# Patient Record
Sex: Female | Born: 1960 | Race: White | Hispanic: No | Marital: Married | Smoking: Current every day smoker
Health system: Southern US, Community
[De-identification: ages and names within clinical notes are randomized; demographics above are authoritative.]

## PROBLEM LIST (undated history)

## (undated) ENCOUNTER — Emergency Department (HOSPITAL_BASED_OUTPATIENT_CLINIC_OR_DEPARTMENT_OTHER): Admission: EM | Discharge status: Absent without leave | Payer: Self-pay

## (undated) DIAGNOSIS — K219 Gastro-esophageal reflux disease without esophagitis: Secondary | ICD-10-CM

## (undated) DIAGNOSIS — Z8719 Personal history of other diseases of the digestive system: Secondary | ICD-10-CM

## (undated) DIAGNOSIS — R911 Solitary pulmonary nodule: Secondary | ICD-10-CM

## (undated) DIAGNOSIS — I2510 Atherosclerotic heart disease of native coronary artery without angina pectoris: Secondary | ICD-10-CM

## (undated) DIAGNOSIS — Z973 Presence of spectacles and contact lenses: Secondary | ICD-10-CM

## (undated) DIAGNOSIS — G8929 Other chronic pain: Secondary | ICD-10-CM

## (undated) DIAGNOSIS — Z794 Long term (current) use of insulin: Secondary | ICD-10-CM

## (undated) DIAGNOSIS — E118 Type 2 diabetes mellitus with unspecified complications: Secondary | ICD-10-CM

## (undated) DIAGNOSIS — M545 Low back pain, unspecified: Secondary | ICD-10-CM

## (undated) DIAGNOSIS — Z9289 Personal history of other medical treatment: Secondary | ICD-10-CM

## (undated) DIAGNOSIS — J45909 Unspecified asthma, uncomplicated: Secondary | ICD-10-CM

## (undated) DIAGNOSIS — I469 Cardiac arrest, cause unspecified: Secondary | ICD-10-CM

## (undated) DIAGNOSIS — Z8711 Personal history of peptic ulcer disease: Secondary | ICD-10-CM

## (undated) DIAGNOSIS — R0789 Other chest pain: Secondary | ICD-10-CM

## (undated) DIAGNOSIS — E119 Type 2 diabetes mellitus without complications: Secondary | ICD-10-CM

## (undated) DIAGNOSIS — R51 Headache: Secondary | ICD-10-CM

## (undated) DIAGNOSIS — I509 Heart failure, unspecified: Secondary | ICD-10-CM

## (undated) DIAGNOSIS — F419 Anxiety disorder, unspecified: Secondary | ICD-10-CM

## (undated) DIAGNOSIS — E785 Hyperlipidemia, unspecified: Secondary | ICD-10-CM

## (undated) DIAGNOSIS — F329 Major depressive disorder, single episode, unspecified: Secondary | ICD-10-CM

## (undated) DIAGNOSIS — R519 Headache, unspecified: Secondary | ICD-10-CM

## (undated) DIAGNOSIS — I219 Acute myocardial infarction, unspecified: Secondary | ICD-10-CM

## (undated) DIAGNOSIS — G629 Polyneuropathy, unspecified: Secondary | ICD-10-CM

## (undated) DIAGNOSIS — M25519 Pain in unspecified shoulder: Secondary | ICD-10-CM

## (undated) DIAGNOSIS — M1990 Unspecified osteoarthritis, unspecified site: Secondary | ICD-10-CM

## (undated) DIAGNOSIS — E1340 Other specified diabetes mellitus with diabetic neuropathy, unspecified: Secondary | ICD-10-CM

## (undated) DIAGNOSIS — E039 Hypothyroidism, unspecified: Secondary | ICD-10-CM

## (undated) DIAGNOSIS — IMO0001 Reserved for inherently not codable concepts without codable children: Secondary | ICD-10-CM

## (undated) DIAGNOSIS — I209 Angina pectoris, unspecified: Secondary | ICD-10-CM

## (undated) DIAGNOSIS — F32A Depression, unspecified: Secondary | ICD-10-CM

## (undated) DIAGNOSIS — J449 Chronic obstructive pulmonary disease, unspecified: Secondary | ICD-10-CM

## (undated) DIAGNOSIS — I10 Essential (primary) hypertension: Secondary | ICD-10-CM

## (undated) HISTORY — PX: ABDOMINAL HYSTERECTOMY: SHX81

## (undated) HISTORY — PX: ESOPHAGOGASTRODUODENOSCOPY: SHX1529

## (undated) HISTORY — DX: Other chest pain: R07.89

## (undated) HISTORY — PX: CARDIAC SURGERY: SHX584

## (undated) HISTORY — DX: Solitary pulmonary nodule: R91.1

## (undated) HISTORY — DX: Other chronic pain: G89.29

## (undated) HISTORY — DX: Type 2 diabetes mellitus with unspecified complications: E11.8

## (undated) HISTORY — DX: Atherosclerotic heart disease of native coronary artery without angina pectoris: I25.10

## (undated) HISTORY — DX: Unspecified osteoarthritis, unspecified site: M19.90

## (undated) HISTORY — DX: Gastro-esophageal reflux disease without esophagitis: K21.9

## (undated) HISTORY — DX: Angina pectoris, unspecified: I20.9

## (undated) HISTORY — DX: Chronic obstructive pulmonary disease, unspecified: J44.9

## (undated) HISTORY — DX: Essential (primary) hypertension: I10

## (undated) HISTORY — DX: Acute myocardial infarction, unspecified: I21.9

## (undated) HISTORY — DX: Type 2 diabetes mellitus without complications: E11.9

## (undated) HISTORY — PX: COLONOSCOPY: SHX174

## (undated) HISTORY — DX: Hyperlipidemia, unspecified: E78.5

## (undated) HISTORY — DX: Pain in unspecified shoulder: M25.519

---

## 1989-05-04 HISTORY — PX: BREAST SURGERY: SHX581

## 1989-05-04 HISTORY — PX: GASTRIC RESECTION: SHX5248

## 1989-05-04 HISTORY — PX: ABDOMINAL HYSTERECTOMY: SHX81

## 1998-09-03 HISTORY — PX: LAPAROSCOPIC CHOLECYSTECTOMY: SUR755

## 2013-11-01 DIAGNOSIS — I219 Acute myocardial infarction, unspecified: Secondary | ICD-10-CM

## 2013-11-01 DIAGNOSIS — Z955 Presence of coronary angioplasty implant and graft: Secondary | ICD-10-CM | POA: Insufficient documentation

## 2013-11-01 HISTORY — DX: Acute myocardial infarction, unspecified: I21.9

## 2013-11-11 HISTORY — PX: CORONARY ANGIOPLASTY WITH STENT PLACEMENT: SHX49

## 2014-05-12 ENCOUNTER — Encounter: Payer: Self-pay | Admitting: Physical Medicine & Rehabilitation

## 2014-06-14 ENCOUNTER — Encounter: Payer: BC Managed Care – PPO | Attending: Physical Medicine & Rehabilitation

## 2014-06-14 ENCOUNTER — Ambulatory Visit: Payer: Self-pay | Admitting: Physical Medicine & Rehabilitation

## 2014-09-01 HISTORY — PX: CARDIAC CATHETERIZATION: SHX172

## 2014-09-10 ENCOUNTER — Encounter: Payer: Self-pay | Admitting: *Deleted

## 2014-09-10 DIAGNOSIS — M1990 Unspecified osteoarthritis, unspecified site: Secondary | ICD-10-CM | POA: Insufficient documentation

## 2014-09-10 DIAGNOSIS — M549 Dorsalgia, unspecified: Secondary | ICD-10-CM

## 2014-09-10 DIAGNOSIS — I209 Angina pectoris, unspecified: Secondary | ICD-10-CM | POA: Insufficient documentation

## 2014-09-10 DIAGNOSIS — M25519 Pain in unspecified shoulder: Secondary | ICD-10-CM

## 2014-09-10 DIAGNOSIS — E118 Type 2 diabetes mellitus with unspecified complications: Secondary | ICD-10-CM | POA: Insufficient documentation

## 2014-09-10 DIAGNOSIS — E785 Hyperlipidemia, unspecified: Secondary | ICD-10-CM | POA: Insufficient documentation

## 2014-09-10 DIAGNOSIS — R0789 Other chest pain: Secondary | ICD-10-CM | POA: Insufficient documentation

## 2014-09-10 DIAGNOSIS — K219 Gastro-esophageal reflux disease without esophagitis: Secondary | ICD-10-CM | POA: Insufficient documentation

## 2014-09-10 DIAGNOSIS — G8929 Other chronic pain: Secondary | ICD-10-CM | POA: Insufficient documentation

## 2014-09-10 DIAGNOSIS — I25119 Atherosclerotic heart disease of native coronary artery with unspecified angina pectoris: Secondary | ICD-10-CM | POA: Insufficient documentation

## 2014-09-10 DIAGNOSIS — J449 Chronic obstructive pulmonary disease, unspecified: Secondary | ICD-10-CM | POA: Insufficient documentation

## 2014-09-13 ENCOUNTER — Encounter: Payer: Self-pay | Admitting: Thoracic Surgery (Cardiothoracic Vascular Surgery)

## 2014-09-13 ENCOUNTER — Institutional Professional Consult (permissible substitution) (INDEPENDENT_AMBULATORY_CARE_PROVIDER_SITE_OTHER): Payer: BLUE CROSS/BLUE SHIELD | Admitting: Thoracic Surgery (Cardiothoracic Vascular Surgery)

## 2014-09-13 ENCOUNTER — Other Ambulatory Visit: Payer: Self-pay | Admitting: *Deleted

## 2014-09-13 VITALS — BP 122/75 | HR 97 | Resp 16 | Ht 63.0 in | Wt 208.0 lb

## 2014-09-13 DIAGNOSIS — I209 Angina pectoris, unspecified: Secondary | ICD-10-CM

## 2014-09-13 DIAGNOSIS — I2510 Atherosclerotic heart disease of native coronary artery without angina pectoris: Secondary | ICD-10-CM

## 2014-09-13 DIAGNOSIS — I25119 Atherosclerotic heart disease of native coronary artery with unspecified angina pectoris: Secondary | ICD-10-CM

## 2014-09-13 NOTE — Progress Notes (Signed)
[address]       Sara Hobbs [address]             [phone]     CARDIOTHORACIC SURGERY CONSULTATION REPORT  Referring Provider is Sara Hobbs., MD PCP is Sara Islam, MD  Chief Complaint  Patient presents with  . 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 The patient's cardiac history dates back to March 2015 when she was hospitalized at [facility] with an acute non-ST segment elevation myocardial infarction.  She underwent diagnostic cardiac catheterization with subsequent PCI and stenting of the left circumflex coronary artery using a drug-eluting stent. She was noted to have multivessel coronary artery disease at the time, although disease involving the left anterior descending coronary artery and the right coronary artery territories was felt to be only moderate and not flow-limiting at that time. The patient states that she has continued to experience symptoms of chest discomfort ever since last March without any period of complete relief.  She describes substernal chest pain or chest tightness with exertion that has continued to progress in severity over several months. She occasionally has episodes of chest discomfort with minimal activity and at rest. Symptoms are usually relieved by administration of sublingual nitroglycerin. She has not had any prolonged episodes of chest discomfort that were unrelieved by  nitroglycerin. She has been compliant with medical therapy including dual antiplatelet therapy using both aspirin and Brilinta.  She does not experience shortness of breath in the absence of ongoing symptoms of chest pain. She denies any history of PND, orthopnea, palpitations, dizzy spells, or syncope. She has had some mild lower extremity edema.  Because of persistent symptoms suggestive of angina pectoris, the patient underwent dobutamine stress echocardiogram that suggested the presence of inferior wall ischemia. She underwent follow-up diagnostic cardiac catheterization by Dr. Rhona Hobbs on 09/01/2014 at [facility].  Catheterization demonstrated continued patency of the stent in the left circumflex coronary artery with significant progression of disease in the other vascular territory including high-grade stenosis of the left anterior descending coronary artery involving bifurcation with the diagonal branch, and log segment subtotal occlusion of the mid and distal right coronary artery.  Left ventricular systolic function appeared normal, and this was additionally confirmed on follow-up transthoracic echocardiogram. The patient was evaluated for possible surgical revascularization, but at the patient's request the patient has now been referred for a second opinion.  The patient is married and lives locally in [place] with her husband. 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 The patient lives a somewhat sedentary lifestyle. She is limited at this point primarily because of severe exertional chest pain. The patient also remains somewhat limited because of chronic pain in both shoulders, more so on the right than the left. The patient has problems with chronic pain in both lower extremities related to diabetic neuropathy.  She checks  her blood sugars regularly and states that her hemoglobin A1c has  decreased from 14 to 9 when it was checked most recently. She continues to smoke 2 packs of cigarettes daily.  Past Medical History  Diagnosis Date  . Arthritis   . Back pain, chronic   . Chronic shoulder pain   . COPD (chronic obstructive pulmonary disease)   . 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 CAD, multiple vessel   . Hyperlipidemia   . GERD (gastroesophageal reflux disease)   . Chest tightness   . Angina pectoris   . Myocardial infarction     PCI w/ drug eluting stent LCx  . Diabetes mellitus with complication     Past Surgical History  Procedure Laterality Date  . Cesarean section    . Cholecystectomy    . Abdominal hysterectomy    . Gastric resection      bezoar  . Percutaneous coronary stent intervention (pci-s)  11/11/2013    Drug-eluting stent in LCx    Family History  Problem Relation Age of Onset  . Adopted: Yes  . Family history unknown: Yes    History   Social History  . Marital Status: Married    Spouse Name: N/A    Number of Children: N/A  . Years of Education: N/A   Occupational History  . Not on file.   Social History Main Topics  . Smoking status: Current Every Day Smoker -- 2.00 packs/day for 42 years    Types: Cigarettes  . Smokeless tobacco: Former Neurosurgeon    Types: Chew    Quit date: 09/13/2005  . Alcohol Use: No  . Drug Use: No  . Sexual Activity: Not on file   Other Topics Concern  . Not on file   Social History Narrative    Current Outpatient Prescriptions  Medication Sig Dispense Refill  . amoxicillin (AMOXIL) 500 MG capsule Take 500 mg by mouth 3 (three) times daily. D/C ON 09/17/13    . tapentadol (NUCYNTA) 50 MG TABS tablet Take 50 mg by mouth 4 (four) times daily. 1/2 TO 1 TAB    . albuterol (PROVENTIL HFA;VENTOLIN HFA) 108 (90 BASE) MCG/ACT inhaler Inhale 2 puffs into the lungs every 6 (six) hours as needed for wheezing or shortness of breath.    Marland Kitchen aspirin EC 81 MG tablet Take 81 mg by mouth  daily.    Marland Kitchen atorvastatin (LIPITOR) 80 MG tablet Take 80 mg by mouth daily.    . canagliflozin (INVOKANA) 300 MG TABS tablet Take 300 mg by mouth daily before breakfast.    . carvedilol (COREG) 6.25 MG tablet Take 6.25 mg by mouth 2 (two) times daily with a meal.    . DULoxetine (CYMBALTA) 30 MG capsule Take 30 mg by mouth daily.    . fenofibrate (TRICOR) 145 MG tablet Take 145 mg by mouth daily.    . Fluticasone-Salmeterol (ADVAIR) 100-50 MCG/DOSE AEPB Inhale 1 puff into the lungs 2 (two) times daily.    Marland Kitchen glucose blood test strip 1 each by Other route 3 (three) times daily. Use as instructed    . HYDROcodone-acetaminophen (NORCO) 7.5-325 MG per tablet Take 1 tablet by mouth 2 (two) times daily as needed for moderate pain.    Marland Kitchen insulin glargine (LANTUS) 100 UNIT/ML injection Inject 58 Units into the skin 2 (two) times daily.     . Insulin Pen Needle 31G X 8 MM MISC by Does not apply route. USE AS DIRECTED    .  nitroGLYCERIN (NITROSTAT) 0.4 MG SL tablet Place 0.4 mg under the tongue every 5 (five) minutes as needed for chest pain.    . Oxycodone HCl 10 MG TABS Take 10 mg by mouth every 8 (eight) hours as needed.    . ticagrelor (BRILINTA) 90 MG TABS tablet Take by mouth 2 (two) times daily.    Marland Kitchen tiotropium (SPIRIVA) 18 MCG inhalation capsule Place 18 mcg into inhaler and inhale daily.     No current facility-administered medications for this visit.    Allergies  Allergen Reactions  . Wellbutrin [Bupropion] Other (See Comments)    SEVERE CHEST PAIN     Review of Systems:   General:  normal appetite, decreased energy, + weight gain, no weight loss, no fever  Cardiac:  + chest pain with exertion, + chest pain at rest, + SOB with exertion, occasional resting SOB, no PND, no orthopnea, no palpitations, no arrhythmia, no atrial fibrillation, + LE edema, no dizzy spells, no syncope  Respiratory:  + shortness of breath, no home oxygen, no productive cough, + chronic dry cough, no bronchitis, no  wheezing, no hemoptysis, no asthma, no pain with inspiration or cough, no sleep apnea, no CPAP at night  GI:   no difficulty swallowing, + reflux, no frequent heartburn, no hiatal hernia, no abdominal pain, no constipation, no diarrhea, no hematochezia, no hematemesis, no melena  GU:   no dysuria,  no frequency, occasional urinary tract infection, no hematuria, no kidney stones, no kidney disease  Vascular:  + pain suggestive of claudication, + chronic pain in feet, no leg cramps, no varicose veins, no DVT, no non-healing foot ulcer  Neuro:   no stroke, no TIA's, no seizures, no headaches, no temporary blindness one eye,  no slurred speech, + peripheral neuropathy, + chronic pain, no instability of gait, no memory/cognitive dysfunction  Musculoskeletal: + arthritis, + joint swelling, no myalgias, mild difficulty walking, decreased mobility   Skin:   no rash, no itching, no skin infections, no pressure sores or ulcerations  Psych:   + anxiety, + depression, + nervousness, + unusual recent stress  Eyes:   no blurry vision, no floaters, no recent vision changes, + wears glasses or contacts  ENT:   no hearing loss, no loose or painful teeth, no dentures, last saw dentist ?  Hematologic:  + easy bruising, no abnormal bleeding, no clotting disorder, no frequent epistaxis  Endocrine:  + diabetes, checks CBG's at home     Physical Exam:   BP 122/75 mmHg  Pulse 97  Resp 16  Ht  (1.6 m)  Wt 208 lb (94.348 kg)  BMI 36.85 kg/m2  SpO2 98%  General:  Obese female in NAD    HEENT:  Unremarkable   Neck:   no JVD, no bruits, no adenopathy   Chest:   clear to auscultation, symmetrical breath sounds, no wheezes, no rhonchi   CV:   RRR, no murmur   Abdomen:  soft, non-tender, no masses   Extremities:  warm, well-perfused, pulses diminished, no LE edema  Rectal/GU  Deferred  Neuro:   Grossly non-focal and symmetrical throughout  Skin:   Clean and dry, no rashes, no breakdown   Diagnostic  Tests:  TRANSTHORACIC ECHOCARDIOGRAM  Both the images and report from transthoracic echocardiogram performed 09/01/2014 at [facility] are reviewed. There is normal left ventricular systolic function with ejection fraction estimated 65%. There is moderate left ventricular hypertrophy. No other significant abnormalities are noted.  CARDIAC CATHETERIZATION  The patient has severe three-vessel coronary artery disease with relatively small and diffusely diseased distal vessels. There is high-grade stenosis of the proximal left anterior descending coronary artery involving takeoff with a medium-sized diagonal branch. There is diffuse disease within the left circumflex territory but no significant flow-limiting lesions, and the stent placed in the circumflex appears widely patent. There is right dominant coronary circulation with long segment subtotal occlusion of the mid and distal right coronary artery. The terminal branches of the right coronary system are small and diffusely diseased. Left ventricular systolic function appears normal.   Impression:  The patient has severe three-vessel coronary artery disease with preserved left ventricular systolic function. There is high-grade stenosis involving the left anterior descending coronary artery and long segment subtotal occlusion of the right coronary artery, both of which have reportedly progressed significantly since the patient's previous catheterization performed last March.  She has diffuse coronary artery disease with relatively poor distal target vessels for grafting, as is commonly seen with patients with long-standing poorly controlled diabetes mellitus.  The patient continues to experience chronic symptoms of chest pain consistent with angina pectoris, CCS functional class 3-4.  I agree that she would best be treated with surgical revascularization. However, risks associated with surgery will be somewhat elevated including a  significant risk of perioperative myocardial infarction and/or premature graft failure because of the presence of diffuse coronary artery disease with relatively poor target vessels for grafting. Perhaps most importantly, the patient's long-term prognosis will likely be poor if she cannot manage to stop smoking and bring her diabetes under adequate control.  The patient's dual antiplatelet therapy using Brilinta will need to be stopped prior to surgery. Options include inpatient admission for intravenous anticoagulation versus outpatient therapy without bridging. Because the patient's drug eluding stent was placed approximately 10 months ago it is probably reasonable to proceed without bridging, particularly since it is notably widely patent on the recent catheterization.   Plan:  I have reviewed the indications, risks, and potential benefits of coronary artery bypass grafting with the patient and her husband in the office today.  Alternative treatment strategies have been discussed.  The patient understands and accepts all potential associated risks of surgery including but not limited to risk of death, stroke or other neurologic complication, myocardial infarction, congestive heart failure, respiratory failure, renal failure, bleeding requiring blood transfusion and/or reexploration, aortic dissection or other major vascular complication, arrhythmia, heart block or bradycardia requiring permanent pacemaker, pneumonia, pleural effusion, wound infection, pulmonary embolus or other thromboembolic complication, chronic pain or other delayed complications related to median sternotomy, or the late recurrence of symptomatic ischemic heart disease and/or congestive heart failure.  The importance of long term risk modification have been emphasized, including both smoking cessation and long term attention to strict control of diabetes.  We plan to proceed with surgery on 09/22/2014. The patient has been instructed to  stop taking Brilinta on 09/15/2014.  The option of inpatient hospitalization for bridging therapy off Brilinta was discussed, but the patient declined.  She understands the small potential associated risk for acute stent thrombosis.  She has been reminded that she should call EMS or go directly to the emergency room if she develops chest pain unresponsive to nitroglycerin therapy during the interim period of time.  All questions answered.       I spent in excess of 90 minutes during the conduct of this office consultation and >50% of this time involved direct face-to-face encounter with the patient  for counseling and/or coordination of their care.   Salvatore Decent. Cornelius Moras, MD 09/13/2014 8:16 PM

## 2014-09-13 NOTE — Patient Instructions (Addendum)
Patient has been instructed to stop taking Brilinta this Wednesday 1/13  Patient should continue taking all other medications without change through the day before surgery.  Patient should have nothing to eat or drink after midnight the night before surgery.  On the morning of surgery patient should take only carvedilol (COREG) with a sip of water.  Call EMS or go directly to the emergency room if you have chest pain that persists despite taking nitroglycerin x3

## 2014-09-16 NOTE — H&P (Signed)
[address]       [name] [address]             [phone]          CARDIOTHORACIC SURGERY HISTORY AND PHYSICAL EXAM  Referring Provider is Diamond Nickel., MD PCP is Lavell Islam, MD  Chief Complaint  Patient presents with  . 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 The patient's cardiac history dates back to March 2015 when she was hospitalized at [facility] with an acute non-ST segment elevation myocardial infarction. She underwent diagnostic cardiac catheterization with subsequent PCI and stenting of the left circumflex coronary artery using a drug-eluting stent. She was noted to have multivessel coronary artery disease at the time, although disease involving the left anterior descending coronary artery and the right coronary artery territories was felt to be only moderate and not flow-limiting at that time. The patient states that she has continued to experience symptoms of chest discomfort ever since last March without any period of complete relief. She describes substernal chest pain or chest tightness with exertion that has continued to progress in severity over several months. She occasionally has episodes of chest discomfort with minimal activity and at rest. Symptoms are usually relieved by administration of sublingual nitroglycerin. She has not had any prolonged episodes of chest discomfort that  were unrelieved by nitroglycerin. She has been compliant with medical therapy including dual antiplatelet therapy using both aspirin and Brilinta. She does not experience shortness of breath in the absence of ongoing symptoms of chest pain. She denies any history of PND, orthopnea, palpitations, dizzy spells, or syncope. She has had some mild lower extremity edema. Because of persistent symptoms suggestive of angina pectoris, the patient underwent dobutamine stress echocardiogram that suggested the presence of inferior wall ischemia. She underwent follow-up diagnostic cardiac catheterization by Dr. Rhona Leavens on 09/01/2014 at [facility]. Catheterization demonstrated continued patency of the stent in the left circumflex coronary artery with significant progression of disease in the other vascular territory including high-grade stenosis of the left anterior descending coronary artery involving bifurcation with the diagonal branch, and log segment subtotal occlusion of the mid and distal right coronary artery. Left ventricular systolic function appeared normal, and this was additionally confirmed on follow-up transthoracic echocardiogram. The patient was evaluated for possible surgical revascularization, but at the patient's request the patient has now been referred for a second opinion.  The patient is married and lives locally in [place] with her husband. 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 The patient lives a somewhat sedentary lifestyle. She is limited at this point primarily because of severe exertional chest pain. The patient also remains somewhat limited because of chronic pain in both shoulders, more so on the right than the left. The patient has problems with chronic pain in both lower extremities related to diabetic neuropathy. She checks  her blood sugars regularly and states that her  hemoglobin A1c has decreased from 14 to 9 when it was checked most recently. She continues to smoke 2 packs of cigarettes daily.  Past Medical History  Diagnosis Date  . Arthritis   . Back pain, chronic   . Chronic shoulder pain   . COPD (chronic obstructive pulmonary disease)   . 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 CAD, multiple vessel   . Hyperlipidemia   . GERD (gastroesophageal reflux disease)   . Chest tightness   . Angina pectoris   . Myocardial infarction     PCI w/ drug eluting stent LCx  . Diabetes mellitus with complication     Past Surgical History  Procedure Laterality Date  . Cesarean section    . Cholecystectomy    . Abdominal hysterectomy    . Gastric resection      bezoar  . Percutaneous coronary stent intervention (pci-s)  11/11/2013    Drug-eluting stent in LCx    Family History  Problem Relation Age of Onset  . Adopted: Yes  . Family history unknown: Yes    History   Social History  . Marital Status: Married    Spouse Name: N/A    Number of Children: N/A  . Years of Education: N/A   Occupational History  . Not on file.   Social History Main Topics  . Smoking status: Current Every Day Smoker -- 2.00 packs/day for 42 years    Types: Cigarettes  . Smokeless tobacco: Former Neurosurgeon    Types: Chew    Quit date: 09/13/2005  . Alcohol Use: No  . Drug Use: No  . Sexual Activity: Not on file   Other Topics Concern  . Not on file   Social History Narrative    Current Outpatient Prescriptions  Medication Sig Dispense Refill  . amoxicillin (AMOXIL) 500 MG capsule Take 500 mg by mouth 3 (three) times daily. D/C ON 09/17/13    . tapentadol (NUCYNTA) 50 MG TABS tablet Take 50 mg by mouth 4 (four) times daily. 1/2 TO 1 TAB    . albuterol (PROVENTIL  HFA;VENTOLIN HFA) 108 (90 BASE) MCG/ACT inhaler Inhale 2 puffs into the lungs every 6 (six) hours as needed for wheezing or shortness of breath.    Marland Kitchen aspirin EC 81 MG tablet Take 81 mg by mouth daily.    Marland Kitchen atorvastatin (LIPITOR) 80 MG tablet Take 80 mg by mouth daily.    . canagliflozin (INVOKANA) 300 MG TABS tablet Take 300 mg by mouth daily before breakfast.    . carvedilol (COREG) 6.25 MG tablet Take 6.25 mg by mouth 2 (two) times daily with a meal.    . DULoxetine (CYMBALTA) 30 MG capsule Take 30 mg by mouth daily.    . fenofibrate (TRICOR) 145 MG tablet Take 145 mg by mouth daily.    . Fluticasone-Salmeterol (ADVAIR) 100-50 MCG/DOSE AEPB Inhale 1 puff into the lungs 2 (two) times daily.    Marland Kitchen glucose blood test strip 1 each by Other route 3 (three) times daily. Use as instructed    . HYDROcodone-acetaminophen (NORCO) 7.5-325 MG per tablet Take 1 tablet by mouth 2 (two) times daily as needed for moderate pain.    Marland Kitchen insulin glargine (LANTUS) 100 UNIT/ML injection Inject 58 Units into the skin 2 (two) times daily.     . Insulin Pen Needle 31G X 8 MM MISC by Does not apply route. USE AS DIRECTED    .  nitroGLYCERIN (NITROSTAT) 0.4 MG SL tablet Place 0.4 mg under the tongue every 5 (five) minutes as needed for chest pain.    . Oxycodone HCl 10 MG TABS Take 10 mg by mouth every 8 (eight) hours as needed.    . ticagrelor (BRILINTA) 90 MG TABS tablet Take by mouth 2 (two) times daily.    Marland Kitchen tiotropium (SPIRIVA) 18 MCG inhalation capsule Place 18 mcg into inhaler and inhale daily.     No current facility-administered medications for this visit.    Allergies  Allergen Reactions  . Wellbutrin [Bupropion] Other (See Comments)    SEVERE CHEST PAIN     Review of Systems:  General:normal appetite, decreased energy, + weight gain, no weight loss, no  fever Cardiac:+ chest pain with exertion, + chest pain at rest, + SOB with exertion, occasional resting SOB, no PND, no orthopnea, no palpitations, no arrhythmia, no atrial fibrillation, + LE edema, no dizzy spells, no syncope Respiratory:+ shortness of breath, no home oxygen, no productive cough, + chronic dry cough, no bronchitis, no wheezing, no hemoptysis, no asthma, no pain with inspiration or cough, no sleep apnea, no CPAP at night GI:no difficulty swallowing, + reflux, no frequent heartburn, no hiatal hernia, no abdominal pain, no constipation, no diarrhea, no hematochezia, no hematemesis, no melena GU:no dysuria, no frequency, occasional urinary tract infection, no hematuria, no kidney stones, no kidney disease Vascular:+ pain suggestive of claudication, + chronic pain in feet, no leg cramps, no varicose veins, no DVT, no non-healing foot ulcer Neuro:no stroke, no TIA's, no seizures, no headaches, no temporary blindness one eye, no slurred speech, + peripheral neuropathy, + chronic pain, no instability of gait, no memory/cognitive dysfunction Musculoskeletal:+ arthritis, + joint swelling, no myalgias, mild difficulty walking, decreased mobility  Skin:no rash, no itching, no skin infections, no pressure sores or ulcerations Psych:+ anxiety, + depression, + nervousness, + unusual recent stress Eyes:no blurry vision, no floaters, no recent vision changes, + wears glasses or contacts ENT:no hearing loss, no loose or painful teeth, no dentures, last saw dentist ? Hematologic:+ easy  bruising, no abnormal bleeding, no clotting disorder, no frequent epistaxis Endocrine:+ diabetes, checks CBG's at home   Physical Exam:  BP 122/75 mmHg  Pulse 97  Resp 16  Ht  (1.6 m)  Wt 208 lb (94.348 kg)  BMI 36.85 kg/m2  SpO2 98% General:Obese female in NAD  HEENT:Unremarkable  Neck:no JVD, no bruits, no adenopathy  Chest:clear to auscultation, symmetrical breath sounds, no wheezes, no rhonchi  CV:RRR, no murmur  Abdomen:soft, non-tender, no masses  Extremities:warm, well-perfused, pulses diminished, no LE edema Rectal/GUDeferred Neuro:Grossly non-focal and symmetrical throughout Skin:Clean and dry, no rashes, no breakdown   Diagnostic Tests:  TRANSTHORACIC ECHOCARDIOGRAM  Both the images and report from transthoracic echocardiogram performed 09/01/2014 at [facility] are reviewed. There is normal left ventricular systolic function with ejection fraction estimated 65%. There is moderate left ventricular hypertrophy. No other significant abnormalities are noted.   CARDIAC CATHETERIZATION  The patient has severe three-vessel coronary artery disease with relatively small and diffusely diseased distal vessels. There is high-grade stenosis of the proximal left anterior descending coronary artery involving takeoff with a medium-sized diagonal branch. There is diffuse disease within the left circumflex territory but no significant flow-limiting lesions, and the stent placed in the circumflex appears widely patent. There is right  dominant coronary circulation with long segment subtotal occlusion of the mid and distal right coronary artery. The terminal branches of the right coronary  system are small and diffusely diseased. Left ventricular systolic function appears normal.   Impression:  The patient has severe three-vessel coronary artery disease with preserved left ventricular systolic function. There is high-grade stenosis involving the left anterior descending coronary artery and long segment subtotal occlusion of the right coronary artery, both of which have reportedly progressed significantly since the patient's previous catheterization performed last March. She has diffuse coronary artery disease with relatively poor distal target vessels for grafting, as is commonly seen with patients with long-standing poorly controlled diabetes mellitus. The patient continues to experience chronic symptoms of chest pain consistent with angina pectoris, CCS functional class 3-4. I agree that she would best be treated with surgical revascularization. However, risks associated with surgery will be somewhat elevated including a significant risk of perioperative myocardial infarction and/or premature graft failure because of the presence of diffuse coronary artery disease with relatively poor target vessels for grafting. Perhaps most importantly, the patient's long-term prognosis will likely be poor if she cannot manage to stop smoking and bring her diabetes under adequate control. The patient's dual antiplatelet therapy using Brilinta will need to be stopped prior to surgery. Options include inpatient admission for intravenous anticoagulation versus outpatient therapy without bridging. Because the patient's drug eluding stent was placed approximately 10 months ago it is probably reasonable to proceed without bridging, particularly since it is notably widely patent on the recent catheterization.   Plan:  I have reviewed the indications,  risks, and potential benefits of coronary artery bypass grafting with the patient and her husband in the office today. Alternative treatment strategies have been discussed. The patient understands and accepts all potential associated risks of surgery including but not limited to risk of death, stroke or other neurologic complication, myocardial infarction, congestive heart failure, respiratory failure, renal failure, bleeding requiring blood transfusion and/or reexploration, aortic dissection or other major vascular complication, arrhythmia, heart block or bradycardia requiring permanent pacemaker, pneumonia, pleural effusion, wound infection, pulmonary embolus or other thromboembolic complication, chronic pain or other delayed complications related to median sternotomy, or the late recurrence of symptomatic ischemic heart disease and/or congestive heart failure. The importance of long term risk modification have been emphasized, including both smoking cessation and long term attention to strict control of diabetes. We plan to proceed with surgery on 09/22/2014. The patient has been instructed to stop taking Brilinta on 09/15/2014. The option of inpatient hospitalization for bridging therapy off Brilinta was discussed, but the patient declined. She understands the small potential associated risk for acute stent thrombosis. She has been reminded that she should call EMS or go directly to the emergency room if she develops chest pain unresponsive to nitroglycerin therapy during the interim period of time. All questions answered.      I spent in excess of 90 minutes during the conduct of this office consultation and >50% of this time involved direct face-to-face encounter with the patient for counseling and/or coordination of their care.   Salvatore Decent. Cornelius Moras, MD 09/13/2014 8:16 PM

## 2014-09-20 ENCOUNTER — Encounter (HOSPITAL_COMMUNITY)
Admission: RE | Admit: 2014-09-20 | Discharge: 2014-09-20 | Disposition: A | Discharge status: Home / Self Care | Payer: Self-pay | Source: Ambulatory Visit | Attending: Thoracic Surgery (Cardiothoracic Vascular Surgery) | Admitting: Thoracic Surgery (Cardiothoracic Vascular Surgery)

## 2014-09-20 ENCOUNTER — Ambulatory Visit (HOSPITAL_COMMUNITY)
Admission: RE | Admit: 2014-09-20 | Discharge: 2014-09-20 | Disposition: A | Discharge status: Home / Self Care | Payer: Self-pay | Source: Ambulatory Visit | Attending: Thoracic Surgery (Cardiothoracic Vascular Surgery) | Admitting: Thoracic Surgery (Cardiothoracic Vascular Surgery)

## 2014-09-20 ENCOUNTER — Encounter (HOSPITAL_COMMUNITY): Payer: Self-pay

## 2014-09-20 VITALS — BP 118/67 | HR 81 | Temp 98.7°F | Resp 18 | Ht 63.0 in | Wt 210.0 lb

## 2014-09-20 DIAGNOSIS — Z01818 Encounter for other preprocedural examination: Secondary | ICD-10-CM | POA: Insufficient documentation

## 2014-09-20 DIAGNOSIS — I2510 Atherosclerotic heart disease of native coronary artery without angina pectoris: Secondary | ICD-10-CM

## 2014-09-20 HISTORY — DX: Depression, unspecified: F32.A

## 2014-09-20 HISTORY — DX: Personal history of other diseases of the digestive system: Z87.19

## 2014-09-20 HISTORY — DX: Major depressive disorder, single episode, unspecified: F32.9

## 2014-09-20 HISTORY — DX: Presence of spectacles and contact lenses: Z97.3

## 2014-09-20 HISTORY — DX: Headache: R51

## 2014-09-20 HISTORY — DX: Headache, unspecified: R51.9

## 2014-09-20 HISTORY — DX: Hypothyroidism, unspecified: E03.9

## 2014-09-20 HISTORY — DX: Anxiety disorder, unspecified: F41.9

## 2014-09-20 HISTORY — DX: Other specified diabetes mellitus with diabetic neuropathy, unspecified: E13.40

## 2014-09-20 LAB — SURGICAL PCR SCREEN
MRSA, PCR: POSITIVE — AB
Staphylococcus aureus: POSITIVE — AB

## 2014-09-20 LAB — COMPREHENSIVE METABOLIC PANEL
ALK PHOS: 76 U/L (ref 39–117)
ALT: 77 U/L — AB (ref 0–35)
AST: 35 U/L (ref 0–37)
Albumin: 4.3 g/dL (ref 3.5–5.2)
Anion gap: 14 (ref 5–15)
BILIRUBIN TOTAL: 0.6 mg/dL (ref 0.3–1.2)
BUN: 18 mg/dL (ref 6–23)
CHLORIDE: 98 meq/L (ref 96–112)
CO2: 22 mmol/L (ref 19–32)
Calcium: 9.8 mg/dL (ref 8.4–10.5)
Creatinine, Ser: 0.96 mg/dL (ref 0.50–1.10)
GFR calc Af Amer: 77 mL/min — ABNORMAL LOW (ref >90)
GFR calc non Af Amer: 66 mL/min — ABNORMAL LOW (ref >90)
GLUCOSE: 326 mg/dL — AB (ref 70–99)
POTASSIUM: 4.2 mmol/L (ref 3.5–5.1)
SODIUM: 134 mmol/L — AB (ref 135–145)
Total Protein: 7 g/dL (ref 6.0–8.3)

## 2014-09-20 LAB — URINALYSIS, ROUTINE W REFLEX MICROSCOPIC
Bilirubin Urine: NEGATIVE
Hgb urine dipstick: NEGATIVE
Ketones, ur: 15 mg/dL — AB
Leukocytes, UA: NEGATIVE
Nitrite: NEGATIVE
PH: 5 (ref 5.0–8.0)
PROTEIN: NEGATIVE mg/dL
Specific Gravity, Urine: 1.037 — ABNORMAL HIGH (ref 1.005–1.030)
Urobilinogen, UA: 0.2 mg/dL (ref 0.0–1.0)

## 2014-09-20 LAB — PULMONARY FUNCTION TEST
DL/VA % PRED: 94 %
DL/VA: 4.4 ml/min/mmHg/L
DLCO COR: 15.78 ml/min/mmHg
DLCO UNC % PRED: 68 %
DLCO UNC: 15.78 ml/min/mmHg
DLCO cor % pred: 68 %
FEF 25-75 POST: 1.62 L/s
FEF 25-75 PRE: 1.57 L/s
FEF2575-%CHANGE-POST: 2 %
FEF2575-%Pred-Post: 62 %
FEF2575-%Pred-Pre: 61 %
FEV1-%Change-Post: 4 %
FEV1-%PRED-PRE: 62 %
FEV1-%Pred-Post: 65 %
FEV1-Post: 1.72 L
FEV1-Pre: 1.65 L
FEV1FVC-%Change-Post: -1 %
FEV1FVC-%PRED-PRE: 99 %
FEV6-%CHANGE-POST: 6 %
FEV6-%Pred-Post: 67 %
FEV6-%Pred-Pre: 63 %
FEV6-POST: 2.21 L
FEV6-PRE: 2.08 L
FEV6FVC-%Pred-Post: 103 %
FEV6FVC-%Pred-Pre: 103 %
FVC-%Change-Post: 6 %
FVC-%Pred-Post: 65 %
FVC-%Pred-Pre: 62 %
FVC-PRE: 2.08 L
FVC-Post: 2.21 L
Post FEV1/FVC ratio: 78 %
Post FEV6/FVC ratio: 100 %
Pre FEV1/FVC ratio: 79 %
Pre FEV6/FVC Ratio: 100 %
RV % pred: 99 %
RV: 1.8 L
TLC % pred: 79 %
TLC: 3.87 L

## 2014-09-20 LAB — ABO/RH: ABO/RH(D): O POS

## 2014-09-20 LAB — URINE MICROSCOPIC-ADD ON

## 2014-09-20 LAB — TYPE AND SCREEN
ABO/RH(D): O POS
Antibody Screen: NEGATIVE

## 2014-09-20 LAB — CBC
HCT: 44.6 % (ref 36.0–46.0)
HEMOGLOBIN: 15.8 g/dL — AB (ref 12.0–15.0)
MCH: 31.2 pg (ref 26.0–34.0)
MCHC: 35.4 g/dL (ref 30.0–36.0)
MCV: 88 fL (ref 78.0–100.0)
Platelets: 197 10*3/uL (ref 150–400)
RBC: 5.07 MIL/uL (ref 3.87–5.11)
RDW: 12.9 % (ref 11.5–15.5)
WBC: 7.6 10*3/uL (ref 4.0–10.5)

## 2014-09-20 LAB — PROTIME-INR
INR: 0.89 (ref 0.00–1.49)
PROTHROMBIN TIME: 12.1 s (ref 11.6–15.2)

## 2014-09-20 LAB — APTT: APTT: 25 s (ref 24–37)

## 2014-09-20 LAB — HEMOGLOBIN A1C
HEMOGLOBIN A1C: 11.3 % — AB (ref <5.7)
MEAN PLASMA GLUCOSE: 278 mg/dL — AB (ref <117)

## 2014-09-20 MED ORDER — ALBUTEROL SULFATE (2.5 MG/3ML) 0.083% IN NEBU
2.5000 mg | INHALATION_SOLUTION | Freq: Once | RESPIRATORY_TRACT | Status: AC
  Administered 2014-09-20: 2.5 mg via RESPIRATORY_TRACT

## 2014-09-20 NOTE — Progress Notes (Signed)
   09/20/14 1536  OBSTRUCTIVE SLEEP APNEA  Have you ever been diagnosed with sleep apnea through a sleep study? No  Do you snore loudly (loud enough to be heard through closed doors)?  0  Do you often feel tired, fatigued, or sleepy during the daytime? 1  Has anyone observed you stop breathing during your sleep? 0  Do you have, or are you being treated for high blood pressure? 0  BMI more than 35 kg/m2? 1  Age over 54 years old? 1  Neck circumference greater than 40 cm/16 inches? 1  Gender: 0  Obstructive Sleep Apnea Score 4

## 2014-09-20 NOTE — Progress Notes (Signed)
VASCULAR LAB PRELIMINARY  PRELIMINARY  PRELIMINARY  PRELIMINARY  Pre-op Cardiac Surgery  Carotid Findings: Bilateral:  1-39% ICA stenosis.  Vertebral artery flow is antegrade.     Upper Extremity Right Left  Brachial Pressures 118 Triphasic 119 Triphasic  Radial Waveforms Triphasic Triphasic  Ulnar Waveforms Triphasic Triphasic  Palmar Arch (Allen's Test) Normal Abnormal   Findings:  Doppler waveforms on the right remained normal with both radial and ulnar compressions. Left Doppler waveforms remained normal with radial compression and reversed with ulnar compressions.    Lower  Extremity Right Left  Dorsalis Pedis 133 triphasic 128 Triphasic  Posterior Tibial 131 Triphasic 130 Triphasic  Ankle/Brachial Indices 1.12 1.09    Findings:  ABIs and Doppler waveforms indicate normal arterial flow bilaterally at rest.   Maezie Justin, RVS 09/20/2014, 7:29 PM

## 2014-09-20 NOTE — Progress Notes (Signed)
Pt denies SOB and chest pain but is under the care of Dr. Ocie Doyne, cardiologist at [facility]; requested stress test results, echo and EKG for comparison along with LOV notes. Pt PCP is Dr. Andi Devon. Pt stated last dose of Brilinta was taken Thursday, 09/16/14. Spoke with Alycia Rossetti Administrator, Civil Service) at MD's office regarding Aspirin administration; according to Alycia Rossetti," it is okay for pt to continue with Aspirin just hold on DOS." Pt chart forwarded to [place], [place] ( anesthesia) to review abnormal labs and EKG; STAT ABG's needed on DOS.

## 2014-09-21 ENCOUNTER — Encounter (HOSPITAL_COMMUNITY): Payer: Self-pay | Admitting: Certified Registered Nurse Anesthetist

## 2014-09-21 MED ORDER — POTASSIUM CHLORIDE 2 MEQ/ML IV SOLN
80.0000 meq | INTRAVENOUS | Status: DC
  Filled 2014-09-21: qty 40

## 2014-09-21 MED ORDER — VANCOMYCIN HCL 1000 MG IV SOLR
INTRAVENOUS | Status: AC
  Administered 2014-09-22: 1000 mL
  Filled 2014-09-21: qty 1000

## 2014-09-21 MED ORDER — SODIUM CHLORIDE 0.9 % IV SOLN
INTRAVENOUS | Status: DC
  Filled 2014-09-21: qty 40

## 2014-09-21 MED ORDER — SODIUM CHLORIDE 0.9 % IV SOLN
INTRAVENOUS | Status: DC
  Filled 2014-09-21: qty 30

## 2014-09-21 MED ORDER — EPINEPHRINE HCL 1 MG/ML IJ SOLN
0.0000 ug/min | INTRAVENOUS | Status: DC
  Filled 2014-09-21: qty 4

## 2014-09-21 MED ORDER — MAGNESIUM SULFATE 50 % IJ SOLN
40.0000 meq | INTRAMUSCULAR | Status: DC
  Filled 2014-09-21: qty 10

## 2014-09-21 MED ORDER — DEXTROSE 5 % IV SOLN
1.5000 g | INTRAVENOUS | Status: AC
  Administered 2014-09-22: .75 g via INTRAVENOUS
  Administered 2014-09-22: 1.5 g via INTRAVENOUS
  Filled 2014-09-21: qty 1.5

## 2014-09-21 MED ORDER — DEXMEDETOMIDINE HCL IN NACL 400 MCG/100ML IV SOLN
0.1000 ug/kg/h | INTRAVENOUS | Status: DC
  Filled 2014-09-21: qty 100

## 2014-09-21 MED ORDER — METOPROLOL TARTRATE 12.5 MG HALF TABLET: 12.5000 mg | ORAL_TABLET | Freq: Once | ORAL | Status: DC

## 2014-09-21 MED ORDER — DOPAMINE-DEXTROSE 3.2-5 MG/ML-% IV SOLN
0.0000 ug/kg/min | INTRAVENOUS | Status: DC
  Filled 2014-09-21: qty 250

## 2014-09-21 MED ORDER — VANCOMYCIN HCL 10 G IV SOLR
1500.0000 mg | INTRAVENOUS | Status: AC
  Administered 2014-09-22: 1500 mg via INTRAVENOUS
  Filled 2014-09-21: qty 1500

## 2014-09-21 MED ORDER — DEXTROSE 5 % IV SOLN
750.0000 mg | INTRAVENOUS | Status: DC
  Filled 2014-09-21: qty 750

## 2014-09-21 MED ORDER — PHENYLEPHRINE HCL 10 MG/ML IJ SOLN
30.0000 ug/min | INTRAVENOUS | Status: DC
  Filled 2014-09-21: qty 2

## 2014-09-21 MED ORDER — SODIUM CHLORIDE 0.9 % IV SOLN
INTRAVENOUS | Status: DC
  Filled 2014-09-21: qty 2.5

## 2014-09-21 MED ORDER — NITROGLYCERIN IN D5W 200-5 MCG/ML-% IV SOLN
2.0000 ug/min | INTRAVENOUS | Status: DC
  Filled 2014-09-21: qty 250

## 2014-09-21 MED ORDER — PLASMA-LYTE 148 IV SOLN
INTRAVENOUS | Status: AC
  Administered 2014-09-22: 500 mL
  Filled 2014-09-21: qty 2.5

## 2014-09-21 NOTE — Progress Notes (Signed)
Anesthesia Chart Review:  Patient is a 54 year old female scheduled for CABG tomorrow by Dr. Cornelius Moras.  History includes smoking, COPD, DM2, CAD/NSTEMI s/p LCX DES 11/2013, HLD, GERD, hiatal hernia, hypothyroidism, anxiety, depression, gastric resection. BMI is consistent with obesity. PCP is listed as Dr Gabriel Earing. Cardiologist is Dr. Judithe Modest with [facility].    Preoperative EKG, CXR, PFTs noted.  See Dr. Orvan July note for recent echocardiogram and cardiac cath results from [facility]. Full reports are scanned under the Media tab.  Preliminary carotid duplex 09/20/14: 1-39% ICA stenosis. Vertebral flow is antegrade.  Preoperative labs noted.  ALT 77, A1C 11.3.  Discussed with Dr. Cornelius Moras who is comfortable proceeding as patient has unstable angina. Hyperglycemia to be treated as needed (glucomander).  Velna Ochs [facility] Stay [facility]/Anesthesiology Phone [phone] 09/21/2014 1:49 PM

## 2014-09-22 ENCOUNTER — Inpatient Hospital Stay (HOSPITAL_COMMUNITY): Payer: Self-pay | Admitting: Certified Registered Nurse Anesthetist

## 2014-09-22 ENCOUNTER — Inpatient Hospital Stay (HOSPITAL_COMMUNITY)
Admission: RE | Admit: 2014-09-22 | Discharge: 2014-09-27 | DRG: 236 | Disposition: A | Discharge status: Home / Self Care | Payer: Self-pay | Source: Ambulatory Visit | Attending: Thoracic Surgery (Cardiothoracic Vascular Surgery) | Admitting: Thoracic Surgery (Cardiothoracic Vascular Surgery)

## 2014-09-22 ENCOUNTER — Encounter (HOSPITAL_COMMUNITY)
Admission: RE | Disposition: A | Discharge status: Home / Self Care | Payer: Self-pay | Source: Ambulatory Visit | Attending: Thoracic Surgery (Cardiothoracic Vascular Surgery)

## 2014-09-22 ENCOUNTER — Inpatient Hospital Stay (HOSPITAL_COMMUNITY): Payer: MEDICAID | Admitting: Vascular Surgery

## 2014-09-22 ENCOUNTER — Inpatient Hospital Stay (HOSPITAL_COMMUNITY): Payer: Self-pay

## 2014-09-22 ENCOUNTER — Encounter (HOSPITAL_COMMUNITY): Payer: Self-pay | Admitting: Thoracic Surgery (Cardiothoracic Vascular Surgery)

## 2014-09-22 ENCOUNTER — Inpatient Hospital Stay (HOSPITAL_COMMUNITY): Payer: MEDICAID

## 2014-09-22 DIAGNOSIS — M1990 Unspecified osteoarthritis, unspecified site: Secondary | ICD-10-CM | POA: Diagnosis present

## 2014-09-22 DIAGNOSIS — I2510 Atherosclerotic heart disease of native coronary artery without angina pectoris: Secondary | ICD-10-CM

## 2014-09-22 DIAGNOSIS — Z794 Long term (current) use of insulin: Secondary | ICD-10-CM

## 2014-09-22 DIAGNOSIS — E785 Hyperlipidemia, unspecified: Secondary | ICD-10-CM | POA: Diagnosis present

## 2014-09-22 DIAGNOSIS — I25119 Atherosclerotic heart disease of native coronary artery with unspecified angina pectoris: Secondary | ICD-10-CM | POA: Diagnosis present

## 2014-09-22 DIAGNOSIS — Z90710 Acquired absence of both cervix and uterus: Secondary | ICD-10-CM

## 2014-09-22 DIAGNOSIS — G8929 Other chronic pain: Secondary | ICD-10-CM | POA: Diagnosis present

## 2014-09-22 DIAGNOSIS — E118 Type 2 diabetes mellitus with unspecified complications: Secondary | ICD-10-CM | POA: Diagnosis present

## 2014-09-22 DIAGNOSIS — F1120 Opioid dependence, uncomplicated: Secondary | ICD-10-CM | POA: Diagnosis present

## 2014-09-22 DIAGNOSIS — Z951 Presence of aortocoronary bypass graft: Secondary | ICD-10-CM

## 2014-09-22 DIAGNOSIS — I25110 Atherosclerotic heart disease of native coronary artery with unstable angina pectoris: Secondary | ICD-10-CM

## 2014-09-22 DIAGNOSIS — F17210 Nicotine dependence, cigarettes, uncomplicated: Secondary | ICD-10-CM | POA: Diagnosis present

## 2014-09-22 DIAGNOSIS — Z955 Presence of coronary angioplasty implant and graft: Secondary | ICD-10-CM

## 2014-09-22 DIAGNOSIS — I209 Angina pectoris, unspecified: Secondary | ICD-10-CM | POA: Diagnosis present

## 2014-09-22 DIAGNOSIS — Z79899 Other long term (current) drug therapy: Secondary | ICD-10-CM

## 2014-09-22 DIAGNOSIS — I252 Old myocardial infarction: Secondary | ICD-10-CM

## 2014-09-22 DIAGNOSIS — R0789 Other chest pain: Secondary | ICD-10-CM | POA: Diagnosis present

## 2014-09-22 DIAGNOSIS — D62 Acute posthemorrhagic anemia: Secondary | ICD-10-CM | POA: Diagnosis present

## 2014-09-22 DIAGNOSIS — J9811 Atelectasis: Secondary | ICD-10-CM | POA: Diagnosis not present

## 2014-09-22 DIAGNOSIS — J449 Chronic obstructive pulmonary disease, unspecified: Secondary | ICD-10-CM | POA: Diagnosis present

## 2014-09-22 DIAGNOSIS — E1140 Type 2 diabetes mellitus with diabetic neuropathy, unspecified: Secondary | ICD-10-CM | POA: Diagnosis present

## 2014-09-22 DIAGNOSIS — E669 Obesity, unspecified: Secondary | ICD-10-CM | POA: Diagnosis present

## 2014-09-22 DIAGNOSIS — Z7982 Long term (current) use of aspirin: Secondary | ICD-10-CM

## 2014-09-22 DIAGNOSIS — Z888 Allergy status to other drugs, medicaments and biological substances status: Secondary | ICD-10-CM

## 2014-09-22 DIAGNOSIS — K219 Gastro-esophageal reflux disease without esophagitis: Secondary | ICD-10-CM | POA: Diagnosis present

## 2014-09-22 HISTORY — PX: TEE WITHOUT CARDIOVERSION: SHX5443

## 2014-09-22 HISTORY — PX: CORONARY ARTERY BYPASS GRAFT: SHX141

## 2014-09-22 LAB — POCT I-STAT, CHEM 8
BUN: 15 mg/dL (ref 6–23)
BUN: 14 mg/dL (ref 6–23)
BUN: 13 mg/dL (ref 6–23)
BUN: 12 mg/dL (ref 6–23)
BUN: 11 mg/dL (ref 6–23)
CALCIUM ION: 1.12 mmol/L (ref 1.12–1.23)
CREATININE: 0.6 mg/dL (ref 0.50–1.10)
CREATININE: 0.4 mg/dL — AB (ref 0.50–1.10)
Calcium, Ion: 1.27 mmol/L — ABNORMAL HIGH (ref 1.12–1.23)
Calcium, Ion: 1.22 mmol/L (ref 1.12–1.23)
Calcium, Ion: 1.01 mmol/L — ABNORMAL LOW (ref 1.12–1.23)
Calcium, Ion: 1.08 mmol/L — ABNORMAL LOW (ref 1.12–1.23)
Chloride: 99 mEq/L (ref 96–112)
Chloride: 100 mEq/L (ref 96–112)
Chloride: 96 mEq/L (ref 96–112)
Chloride: 99 mEq/L (ref 96–112)
Chloride: 104 mEq/L (ref 96–112)
Creatinine, Ser: 0.7 mg/dL (ref 0.50–1.10)
Creatinine, Ser: 0.4 mg/dL — ABNORMAL LOW (ref 0.50–1.10)
Creatinine, Ser: 0.5 mg/dL (ref 0.50–1.10)
GLUCOSE: 339 mg/dL — AB (ref 70–99)
GLUCOSE: 139 mg/dL — AB (ref 70–99)
Glucose, Bld: 308 mg/dL — ABNORMAL HIGH (ref 70–99)
Glucose, Bld: 264 mg/dL — ABNORMAL HIGH (ref 70–99)
Glucose, Bld: 255 mg/dL — ABNORMAL HIGH (ref 70–99)
HCT: 44 % (ref 36.0–46.0)
HCT: 38 % (ref 36.0–46.0)
HCT: 37 % (ref 36.0–46.0)
HEMATOCRIT: 24 % — AB (ref 36.0–46.0)
HEMATOCRIT: 31 % — AB (ref 36.0–46.0)
HEMOGLOBIN: 8.2 g/dL — AB (ref 12.0–15.0)
HEMOGLOBIN: 12.6 g/dL (ref 12.0–15.0)
Hemoglobin: 15 g/dL (ref 12.0–15.0)
Hemoglobin: 12.9 g/dL (ref 12.0–15.0)
Hemoglobin: 10.5 g/dL — ABNORMAL LOW (ref 12.0–15.0)
POTASSIUM: 4.6 mmol/L (ref 3.5–5.1)
POTASSIUM: 3.6 mmol/L (ref 3.5–5.1)
Potassium: 3.4 mmol/L — ABNORMAL LOW (ref 3.5–5.1)
Potassium: 3.4 mmol/L — ABNORMAL LOW (ref 3.5–5.1)
Potassium: 3.9 mmol/L (ref 3.5–5.1)
SODIUM: 135 mmol/L (ref 135–145)
SODIUM: 135 mmol/L (ref 135–145)
SODIUM: 131 mmol/L — AB (ref 135–145)
Sodium: 135 mmol/L (ref 135–145)
Sodium: 140 mmol/L (ref 135–145)
TCO2: 19 mmol/L (ref 0–100)
TCO2: 21 mmol/L (ref 0–100)
TCO2: 21 mmol/L (ref 0–100)
TCO2: 21 mmol/L (ref 0–100)
TCO2: 23 mmol/L (ref 0–100)

## 2014-09-22 LAB — POCT I-STAT 4, (NA,K, GLUC, HGB,HCT)
Glucose, Bld: 191 mg/dL — ABNORMAL HIGH (ref 70–99)
HCT: 37 % (ref 36.0–46.0)
Hemoglobin: 12.6 g/dL (ref 12.0–15.0)
Potassium: 3.4 mmol/L — ABNORMAL LOW (ref 3.5–5.1)
Sodium: 138 mmol/L (ref 135–145)

## 2014-09-22 LAB — CBC
HCT: 36.9 % (ref 36.0–46.0)
HEMATOCRIT: 34.5 % — AB (ref 36.0–46.0)
Hemoglobin: 12.8 g/dL (ref 12.0–15.0)
Hemoglobin: 12.1 g/dL (ref 12.0–15.0)
MCH: 31 pg (ref 26.0–34.0)
MCH: 30.6 pg (ref 26.0–34.0)
MCHC: 34.7 g/dL (ref 30.0–36.0)
MCHC: 35.1 g/dL (ref 30.0–36.0)
MCV: 89.3 fL (ref 78.0–100.0)
MCV: 87.1 fL (ref 78.0–100.0)
Platelets: 178 K/uL (ref 150–400)
Platelets: 162 10*3/uL (ref 150–400)
RBC: 4.13 MIL/uL (ref 3.87–5.11)
RBC: 3.96 MIL/uL (ref 3.87–5.11)
RDW: 12.9 % (ref 11.5–15.5)
RDW: 13.1 % (ref 11.5–15.5)
WBC: 16.3 K/uL — ABNORMAL HIGH (ref 4.0–10.5)
WBC: 12 10*3/uL — ABNORMAL HIGH (ref 4.0–10.5)

## 2014-09-22 LAB — POCT I-STAT 3, ART BLOOD GAS (G3+)
ACID-BASE DEFICIT: 5 mmol/L — AB (ref 0.0–2.0)
ACID-BASE DEFICIT: 5 mmol/L — AB (ref 0.0–2.0)
Acid-base deficit: 2 mmol/L (ref 0.0–2.0)
Acid-base deficit: 2 mmol/L (ref 0.0–2.0)
Acid-base deficit: 5 mmol/L — ABNORMAL HIGH (ref 0.0–2.0)
Acid-base deficit: 4 mmol/L — ABNORMAL HIGH (ref 0.0–2.0)
BICARBONATE: 23.8 meq/L (ref 20.0–24.0)
Bicarbonate: 23.9 mEq/L (ref 20.0–24.0)
Bicarbonate: 21.9 mEq/L (ref 20.0–24.0)
Bicarbonate: 21.7 mEq/L (ref 20.0–24.0)
Bicarbonate: 21.2 mEq/L (ref 20.0–24.0)
Bicarbonate: 21.6 mEq/L (ref 20.0–24.0)
O2 SAT: 93 %
O2 SAT: 99 %
O2 SAT: 99 %
O2 Saturation: 100 %
O2 Saturation: 100 %
O2 Saturation: 97 %
PCO2 ART: 42 mmHg (ref 35.0–45.0)
PCO2 ART: 43.4 mmHg (ref 35.0–45.0)
PCO2 ART: 41.1 mmHg (ref 35.0–45.0)
PH ART: 7.361 (ref 7.350–7.450)
PH ART: 7.328 — AB (ref 7.350–7.450)
PO2 ART: 374 mmHg — AB (ref 80.0–100.0)
PO2 ART: 95 mmHg (ref 80.0–100.0)
Patient temperature: 36.5
Patient temperature: 36.9
TCO2: 25 mmol/L (ref 0–100)
TCO2: 25 mmol/L (ref 0–100)
TCO2: 23 mmol/L (ref 0–100)
TCO2: 23 mmol/L (ref 0–100)
TCO2: 22 mmol/L (ref 0–100)
TCO2: 23 mmol/L (ref 0–100)
pCO2 arterial: 43.1 mmHg (ref 35.0–45.0)
pCO2 arterial: 48.1 mmHg — ABNORMAL HIGH (ref 35.0–45.0)
pCO2 arterial: 45.9 mmHg — ABNORMAL HIGH (ref 35.0–45.0)
pH, Arterial: 7.352 (ref 7.350–7.450)
pH, Arterial: 7.263 — ABNORMAL LOW (ref 7.350–7.450)
pH, Arterial: 7.284 — ABNORMAL LOW (ref 7.350–7.450)
pH, Arterial: 7.298 — ABNORMAL LOW (ref 7.350–7.450)
pO2, Arterial: 349 mmHg — ABNORMAL HIGH (ref 80.0–100.0)
pO2, Arterial: 76 mmHg — ABNORMAL LOW (ref 80.0–100.0)
pO2, Arterial: 134 mmHg — ABNORMAL HIGH (ref 80.0–100.0)
pO2, Arterial: 141 mmHg — ABNORMAL HIGH (ref 80.0–100.0)

## 2014-09-22 LAB — CREATININE, SERUM
Creatinine, Ser: 0.56 mg/dL (ref 0.50–1.10)
GFR calc Af Amer: >90 mL/min (ref >90)
GFR calc non Af Amer: >90 mL/min (ref >90)

## 2014-09-22 LAB — BLOOD GAS, ARTERIAL
ACID-BASE DEFICIT: 0 mmol/L (ref 0.0–2.0)
Bicarbonate: 23.8 mEq/L (ref 20.0–24.0)
DRAWN BY: 428831
FIO2: 0.21 %
O2 Saturation: 97.8 %
PO2 ART: 98.4 mmHg (ref 80.0–100.0)
Patient temperature: 98.6
TCO2: 24.9 mmol/L (ref 0–100)
pCO2 arterial: 36.4 mmHg (ref 35.0–45.0)
pH, Arterial: 7.43 (ref 7.350–7.450)

## 2014-09-22 LAB — PROTIME-INR
INR: 1.22 (ref 0.00–1.49)
Prothrombin Time: 15.5 s — ABNORMAL HIGH (ref 11.6–15.2)

## 2014-09-22 LAB — GLUCOSE, CAPILLARY
GLUCOSE-CAPILLARY: 171 mg/dL — AB (ref 70–99)
GLUCOSE-CAPILLARY: 136 mg/dL — AB (ref 70–99)
Glucose-Capillary: 114 mg/dL — ABNORMAL HIGH (ref 70–99)
Glucose-Capillary: 131 mg/dL — ABNORMAL HIGH (ref 70–99)
Glucose-Capillary: 147 mg/dL — ABNORMAL HIGH (ref 70–99)
Glucose-Capillary: 150 mg/dL — ABNORMAL HIGH (ref 70–99)
Glucose-Capillary: 158 mg/dL — ABNORMAL HIGH (ref 70–99)
Glucose-Capillary: 218 mg/dL — ABNORMAL HIGH (ref 70–99)
Glucose-Capillary: 231 mg/dL — ABNORMAL HIGH (ref 70–99)
Glucose-Capillary: 97 mg/dL (ref 70–99)

## 2014-09-22 LAB — APTT: aPTT: 28 s (ref 24–37)

## 2014-09-22 LAB — HEMOGLOBIN AND HEMATOCRIT, BLOOD
HEMATOCRIT: 25.7 % — AB (ref 36.0–46.0)
HEMOGLOBIN: 9.1 g/dL — AB (ref 12.0–15.0)

## 2014-09-22 LAB — MAGNESIUM: Magnesium: 2.9 mg/dL — ABNORMAL HIGH (ref 1.5–2.5)

## 2014-09-22 LAB — PLATELET COUNT: Platelets: 156 10*3/uL (ref 150–400)

## 2014-09-22 SURGERY — CORONARY ARTERY BYPASS GRAFTING (CABG)
Anesthesia: General | Site: Chest

## 2014-09-22 MED ORDER — ROCURONIUM BROMIDE 100 MG/10ML IV SOLN
INTRAVENOUS | Status: DC | PRN
  Administered 2014-09-22: 50 mg via INTRAVENOUS

## 2014-09-22 MED ORDER — VECURONIUM BROMIDE 10 MG IV SOLR
INTRAVENOUS | Status: DC | PRN
  Administered 2014-09-22 (×3): 5 mg via INTRAVENOUS

## 2014-09-22 MED ORDER — FENTANYL CITRATE 0.05 MG/ML IJ SOLN
INTRAMUSCULAR | Status: AC
  Filled 2014-09-22: qty 5

## 2014-09-22 MED ORDER — PROPOFOL 10 MG/ML IV BOLUS
INTRAVENOUS | Status: AC
  Filled 2014-09-22: qty 20

## 2014-09-22 MED ORDER — ASPIRIN EC 325 MG PO TBEC
325.0000 mg | DELAYED_RELEASE_TABLET | Freq: Every day | ORAL | Status: DC
  Administered 2014-09-23 – 2014-09-24 (×2): 325 mg via ORAL
  Filled 2014-09-22 (×2): qty 1

## 2014-09-22 MED ORDER — FAMOTIDINE IN NACL 20-0.9 MG/50ML-% IV SOLN
20.0000 mg | Freq: Two times a day (BID) | INTRAVENOUS | Status: AC
  Administered 2014-09-22 (×2): 20 mg via INTRAVENOUS
  Filled 2014-09-22: qty 50

## 2014-09-22 MED ORDER — PANTOPRAZOLE SODIUM 40 MG PO TBEC
40.0000 mg | DELAYED_RELEASE_TABLET | Freq: Every day | ORAL | Status: DC
  Administered 2014-09-24 – 2014-09-26 (×3): 40 mg via ORAL
  Filled 2014-09-22 (×3): qty 1

## 2014-09-22 MED ORDER — SODIUM CHLORIDE 0.9 % IJ SOLN: 3.0000 mL | INTRAMUSCULAR | Status: DC | PRN

## 2014-09-22 MED ORDER — TIOTROPIUM BROMIDE MONOHYDRATE 18 MCG IN CAPS
18.0000 ug | ORAL_CAPSULE | Freq: Every day | RESPIRATORY_TRACT | Status: DC
  Administered 2014-09-23 – 2014-09-27 (×5): 18 ug via RESPIRATORY_TRACT
  Filled 2014-09-22: qty 5

## 2014-09-22 MED ORDER — CHLORHEXIDINE GLUCONATE CLOTH 2 % EX PADS
6.0000 | MEDICATED_PAD | Freq: Every day | CUTANEOUS | Status: DC
  Administered 2014-09-24 – 2014-09-27 (×4): 6 via TOPICAL

## 2014-09-22 MED ORDER — ALBUMIN HUMAN 5 % IV SOLN
250.0000 mL | INTRAVENOUS | Status: AC | PRN
  Administered 2014-09-22 (×3): 250 mL via INTRAVENOUS
  Filled 2014-09-22 (×2): qty 250

## 2014-09-22 MED ORDER — SODIUM CHLORIDE 0.9 % IJ SOLN
3.0000 mL | Freq: Two times a day (BID) | INTRAMUSCULAR | Status: DC
  Administered 2014-09-23 – 2014-09-26 (×4): 3 mL via INTRAVENOUS

## 2014-09-22 MED ORDER — NITROGLYCERIN IN D5W 200-5 MCG/ML-% IV SOLN
INTRAVENOUS | Status: DC | PRN
  Administered 2014-09-22: 20 ug/min via INTRAVENOUS

## 2014-09-22 MED ORDER — ALBUTEROL SULFATE (2.5 MG/3ML) 0.083% IN NEBU: 2.5000 mg | INHALATION_SOLUTION | Freq: Four times a day (QID) | RESPIRATORY_TRACT | Status: DC | PRN

## 2014-09-22 MED ORDER — MUPIROCIN 2 % EX OINT
1.0000 "application " | TOPICAL_OINTMENT | Freq: Two times a day (BID) | CUTANEOUS | Status: AC
  Administered 2014-09-22 – 2014-09-27 (×10): 1 via NASAL
  Filled 2014-09-22 (×2): qty 22

## 2014-09-22 MED ORDER — METOPROLOL TARTRATE 25 MG/10 ML ORAL SUSPENSION
12.5000 mg | Freq: Two times a day (BID) | ORAL | Status: DC
  Filled 2014-09-22 (×3): qty 5

## 2014-09-22 MED ORDER — ONDANSETRON HCL 4 MG/2ML IJ SOLN
4.0000 mg | Freq: Four times a day (QID) | INTRAMUSCULAR | Status: DC | PRN
  Administered 2014-09-23 – 2014-09-26 (×6): 4 mg via INTRAVENOUS
  Filled 2014-09-22 (×6): qty 2

## 2014-09-22 MED ORDER — SODIUM CHLORIDE 0.9 % IV SOLN
INTRAVENOUS | Status: DC
  Administered 2014-09-22: 7.6 [IU]/h via INTRAVENOUS
  Filled 2014-09-22 (×2): qty 2.5

## 2014-09-22 MED ORDER — NITROGLYCERIN IN D5W 200-5 MCG/ML-% IV SOLN: 0.0000 ug/min | INTRAVENOUS | Status: DC

## 2014-09-22 MED ORDER — LACTATED RINGERS IV SOLN
INTRAVENOUS | Status: DC | PRN
  Administered 2014-09-22 (×2): via INTRAVENOUS

## 2014-09-22 MED ORDER — LACTATED RINGERS IV SOLN: INTRAVENOUS | Status: DC

## 2014-09-22 MED ORDER — LACTATED RINGERS IV SOLN: 500.0000 mL | Freq: Once | INTRAVENOUS | Status: AC | PRN

## 2014-09-22 MED ORDER — CHLORHEXIDINE GLUCONATE 4 % EX LIQD
30.0000 mL | CUTANEOUS | Status: DC
  Filled 2014-09-22: qty 30

## 2014-09-22 MED ORDER — TRAMADOL HCL 50 MG PO TABS
50.0000 mg | ORAL_TABLET | ORAL | Status: DC | PRN
  Administered 2014-09-23 – 2014-09-26 (×3): 100 mg via ORAL
  Filled 2014-09-22 (×3): qty 2

## 2014-09-22 MED ORDER — HEPARIN SODIUM (PORCINE) 1000 UNIT/ML IJ SOLN
INTRAMUSCULAR | Status: DC | PRN
  Administered 2014-09-22: 25 mL via INTRAVENOUS

## 2014-09-22 MED ORDER — DOCUSATE SODIUM 100 MG PO CAPS
200.0000 mg | ORAL_CAPSULE | Freq: Every day | ORAL | Status: DC
  Administered 2014-09-23 – 2014-09-26 (×4): 200 mg via ORAL
  Filled 2014-09-22 (×6): qty 2

## 2014-09-22 MED ORDER — PHENYLEPHRINE HCL 10 MG/ML IJ SOLN
0.0000 ug/min | INTRAVENOUS | Status: DC
  Filled 2014-09-22 (×2): qty 2

## 2014-09-22 MED ORDER — METOPROLOL TARTRATE 1 MG/ML IV SOLN
2.5000 mg | INTRAVENOUS | Status: DC | PRN
  Administered 2014-09-23 (×2): 5 mg via INTRAVENOUS

## 2014-09-22 MED ORDER — PHENYLEPHRINE HCL 10 MG/ML IJ SOLN
10.0000 mg | INTRAVENOUS | Status: DC | PRN
  Administered 2014-09-22: 50 ug/min via INTRAVENOUS

## 2014-09-22 MED ORDER — SODIUM CHLORIDE 0.45 % IV SOLN
INTRAVENOUS | Status: DC
  Administered 2014-09-22: 20 mL/h via INTRAVENOUS

## 2014-09-22 MED ORDER — ALBUMIN HUMAN 5 % IV SOLN
INTRAVENOUS | Status: DC | PRN
  Administered 2014-09-22 (×2): via INTRAVENOUS

## 2014-09-22 MED ORDER — POTASSIUM CHLORIDE 10 MEQ/50ML IV SOLN
10.0000 meq | INTRAVENOUS | Status: AC
  Administered 2014-09-22 (×3): 10 meq via INTRAVENOUS

## 2014-09-22 MED ORDER — PROTAMINE SULFATE 10 MG/ML IV SOLN
INTRAVENOUS | Status: DC | PRN
  Administered 2014-09-22: 190 mg via INTRAVENOUS
  Administered 2014-09-22: 10 mg via INTRAVENOUS

## 2014-09-22 MED ORDER — VANCOMYCIN HCL IN DEXTROSE 1-5 GM/200ML-% IV SOLN
1000.0000 mg | Freq: Once | INTRAVENOUS | Status: AC
  Administered 2014-09-22: 1000 mg via INTRAVENOUS
  Filled 2014-09-22: qty 200

## 2014-09-22 MED ORDER — ACETAMINOPHEN 160 MG/5ML PO SOLN
650.0000 mg | Freq: Once | ORAL | Status: AC
  Administered 2014-09-22: 650 mg

## 2014-09-22 MED ORDER — ASPIRIN 81 MG PO CHEW: 324.0000 mg | CHEWABLE_TABLET | Freq: Every day | ORAL | Status: DC

## 2014-09-22 MED ORDER — 0.9 % SODIUM CHLORIDE (POUR BTL) OPTIME
TOPICAL | Status: DC | PRN
  Administered 2014-09-22: 1000 mL

## 2014-09-22 MED ORDER — ALBUTEROL SULFATE HFA 108 (90 BASE) MCG/ACT IN AERS
INHALATION_SPRAY | RESPIRATORY_TRACT | Status: DC | PRN
  Administered 2014-09-22: 6 via RESPIRATORY_TRACT

## 2014-09-22 MED ORDER — DEXMEDETOMIDINE HCL IN NACL 200 MCG/50ML IV SOLN
0.0000 ug/kg/h | INTRAVENOUS | Status: DC
  Administered 2014-09-22: 0.5 ug/kg/h via INTRAVENOUS
  Filled 2014-09-22: qty 50

## 2014-09-22 MED ORDER — BISACODYL 10 MG RE SUPP
10.0000 mg | Freq: Every day | RECTAL | Status: DC
Start: 2014-09-23 — End: 2014-09-27

## 2014-09-22 MED ORDER — INSULIN REGULAR BOLUS VIA INFUSION
0.0000 [IU] | Freq: Three times a day (TID) | INTRAVENOUS | Status: DC
  Administered 2014-09-23: 1 [IU] via INTRAVENOUS
  Filled 2014-09-22: qty 10

## 2014-09-22 MED ORDER — LACTATED RINGERS IV SOLN
INTRAVENOUS | Status: DC | PRN
Start: 2014-09-22 — End: 2014-09-22
  Administered 2014-09-22 (×2): via INTRAVENOUS

## 2014-09-22 MED ORDER — ACETAMINOPHEN 650 MG RE SUPP: 650.0000 mg | Freq: Once | RECTAL | Status: AC

## 2014-09-22 MED ORDER — PROPOFOL 10 MG/ML IV BOLUS
INTRAVENOUS | Status: DC | PRN
  Administered 2014-09-22: 60 mg via INTRAVENOUS
  Administered 2014-09-22: 50 mg via INTRAVENOUS

## 2014-09-22 MED ORDER — MIDAZOLAM HCL 2 MG/2ML IJ SOLN
2.0000 mg | INTRAMUSCULAR | Status: DC | PRN
Start: 2014-09-22 — End: 2014-09-23

## 2014-09-22 MED ORDER — OXYCODONE HCL 5 MG PO TABS
5.0000 mg | ORAL_TABLET | ORAL | Status: DC | PRN
  Administered 2014-09-23 – 2014-09-27 (×17): 10 mg via ORAL
  Filled 2014-09-22 (×17): qty 2

## 2014-09-22 MED ORDER — CEFUROXIME SODIUM 1.5 G IJ SOLR
1.5000 g | Freq: Two times a day (BID) | INTRAMUSCULAR | Status: AC
  Administered 2014-09-22 – 2014-09-24 (×4): 1.5 g via INTRAVENOUS
  Filled 2014-09-22 (×5): qty 1.5

## 2014-09-22 MED ORDER — MIDAZOLAM HCL 5 MG/5ML IJ SOLN
INTRAMUSCULAR | Status: DC | PRN
  Administered 2014-09-22 (×2): 1 mg via INTRAVENOUS
  Administered 2014-09-22: 5 mg via INTRAVENOUS
  Administered 2014-09-22 (×3): 1 mg via INTRAVENOUS

## 2014-09-22 MED ORDER — MORPHINE SULFATE 2 MG/ML IJ SOLN
2.0000 mg | INTRAMUSCULAR | Status: DC | PRN
  Administered 2014-09-23 (×3): 4 mg via INTRAVENOUS
  Filled 2014-09-22: qty 1
  Filled 2014-09-22 (×3): qty 2
  Filled 2014-09-22: qty 1

## 2014-09-22 MED ORDER — SODIUM CHLORIDE 0.9 % IV SOLN
250.0000 [IU] | INTRAVENOUS | Status: DC | PRN
  Administered 2014-09-22: 8.4 [IU]/h via INTRAVENOUS

## 2014-09-22 MED ORDER — BISACODYL 5 MG PO TBEC
10.0000 mg | DELAYED_RELEASE_TABLET | Freq: Every day | ORAL | Status: DC
Start: 2014-09-23 — End: 2014-09-27
  Administered 2014-09-23 – 2014-09-26 (×3): 10 mg via ORAL
  Filled 2014-09-22 (×3): qty 2

## 2014-09-22 MED ORDER — GELATIN ABSORBABLE MT POWD
OROMUCOSAL | Status: DC | PRN
  Administered 2014-09-22: 1 mL via TOPICAL

## 2014-09-22 MED ORDER — SODIUM CHLORIDE 0.9 % IV SOLN
INTRAVENOUS | Status: AC
  Administered 2014-09-22: 100 mL/h via INTRAVENOUS

## 2014-09-22 MED ORDER — ACETAMINOPHEN 500 MG PO TABS
1000.0000 mg | ORAL_TABLET | Freq: Four times a day (QID) | ORAL | Status: DC
  Administered 2014-09-23 – 2014-09-27 (×14): 1000 mg via ORAL
  Filled 2014-09-22 (×19): qty 2

## 2014-09-22 MED ORDER — ALBUTEROL SULFATE HFA 108 (90 BASE) MCG/ACT IN AERS: 2.0000 | INHALATION_SPRAY | Freq: Four times a day (QID) | RESPIRATORY_TRACT | Status: DC | PRN

## 2014-09-22 MED ORDER — FENTANYL CITRATE 0.05 MG/ML IJ SOLN
INTRAMUSCULAR | Status: DC | PRN
Start: 2014-09-22 — End: 2014-09-22
  Administered 2014-09-22 (×2): 100 ug via INTRAVENOUS
  Administered 2014-09-22: 250 ug via INTRAVENOUS
  Administered 2014-09-22: 50 ug via INTRAVENOUS
  Administered 2014-09-22: 150 ug via INTRAVENOUS
  Administered 2014-09-22 (×2): 50 ug via INTRAVENOUS
  Administered 2014-09-22: 100 ug via INTRAVENOUS
  Administered 2014-09-22 (×3): 50 ug via INTRAVENOUS
  Administered 2014-09-22: 1000 ug via INTRAVENOUS

## 2014-09-22 MED ORDER — LIDOCAINE HCL (CARDIAC) 20 MG/ML IV SOLN
INTRAVENOUS | Status: DC | PRN
  Administered 2014-09-22: 100 mg via INTRAVENOUS

## 2014-09-22 MED ORDER — SODIUM BICARBONATE 8.4 % IV SOLN
50.0000 meq | Freq: Once | INTRAVENOUS | Status: AC
  Administered 2014-09-22: 50 meq via INTRAVENOUS

## 2014-09-22 MED ORDER — MAGNESIUM SULFATE 4 GM/100ML IV SOLN
4.0000 g | Freq: Once | INTRAVENOUS | Status: AC
  Administered 2014-09-22: 4 g via INTRAVENOUS
  Filled 2014-09-22: qty 100

## 2014-09-22 MED ORDER — METOPROLOL TARTRATE 12.5 MG HALF TABLET
12.5000 mg | ORAL_TABLET | Freq: Two times a day (BID) | ORAL | Status: DC
  Filled 2014-09-22 (×3): qty 1

## 2014-09-22 MED ORDER — SODIUM CHLORIDE 0.9 % IV SOLN: 250.0000 mL | INTRAVENOUS | Status: DC

## 2014-09-22 MED ORDER — MORPHINE SULFATE 2 MG/ML IJ SOLN
1.0000 mg | INTRAMUSCULAR | Status: AC | PRN
  Administered 2014-09-22 (×3): 2 mg via INTRAVENOUS
  Filled 2014-09-22: qty 1

## 2014-09-22 MED ORDER — SODIUM CHLORIDE 0.9 % IV SOLN
10.0000 g | INTRAVENOUS | Status: DC | PRN
  Administered 2014-09-22: 5 g/h via INTRAVENOUS

## 2014-09-22 MED ORDER — SODIUM CHLORIDE 0.9 % IV SOLN: INTRAVENOUS | Status: DC

## 2014-09-22 MED ORDER — MIDAZOLAM HCL 10 MG/2ML IJ SOLN
INTRAMUSCULAR | Status: AC
  Filled 2014-09-22: qty 2

## 2014-09-22 MED ORDER — ACETAMINOPHEN 160 MG/5ML PO SOLN: 1000.0000 mg | Freq: Four times a day (QID) | ORAL | Status: DC

## 2014-09-22 MED ORDER — SODIUM CHLORIDE 0.9 % IV SOLN
200.0000 ug | INTRAVENOUS | Status: DC | PRN
  Administered 2014-09-22: .3 ug/kg/h via INTRAVENOUS

## 2014-09-22 MED ORDER — LACTATED RINGERS IV SOLN
INTRAVENOUS | Status: DC | PRN
  Administered 2014-09-22: 08:00:00 via INTRAVENOUS

## 2014-09-22 SURGICAL SUPPLY — 135 items
ADAPTER CARDIO PERF ANTE/RETRO (ADAPTER) IMPLANT
APPLIER CLIP 9.375 MED OPEN (MISCELLANEOUS)
APPLIER CLIP 9.375 SM OPEN (CLIP)
ATTRACTOMAT 16X20 MAGNETIC DRP (DRAPES) ×3 IMPLANT
BAG DECANTER FOR FLEXI CONT (MISCELLANEOUS) ×3 IMPLANT
BAG ISOLATION DRAPE 18X18 (DRAPES) ×2 IMPLANT
BANDAGE ELASTIC 4 VELCRO ST LF (GAUZE/BANDAGES/DRESSINGS) ×3 IMPLANT
BANDAGE ELASTIC 6 VELCRO ST LF (GAUZE/BANDAGES/DRESSINGS) ×3 IMPLANT
BASKET HEART (ORDER IN 25'S) (MISCELLANEOUS) ×1
BASKET HEART (ORDER IN 25S) (MISCELLANEOUS) ×2 IMPLANT
BENZOIN TINCTURE PRP APPL 2/3 (GAUZE/BANDAGES/DRESSINGS) ×3 IMPLANT
BLADE STERNUM SYSTEM 6 (BLADE) ×3 IMPLANT
BLADE SURG 11 STRL SS (BLADE) ×3 IMPLANT
BLADE SURG ROTATE 9660 (MISCELLANEOUS) IMPLANT
BNDG GAUZE ELAST 4 BULKY (GAUZE/BANDAGES/DRESSINGS) ×3 IMPLANT
CANISTER SUCTION 2500CC (MISCELLANEOUS) ×3 IMPLANT
CANNULA EZ GLIDE AORTIC 21FR (CANNULA) ×6 IMPLANT
CANNULA GUNDRY RCSP 15FR (MISCELLANEOUS) IMPLANT
CANNULA VENOUS LOW PROF 34X46 (CANNULA) ×3 IMPLANT
CARDIAC SUCTION (MISCELLANEOUS) ×3 IMPLANT
CATH CPB KIT OWEN (MISCELLANEOUS) ×3 IMPLANT
CATH THORACIC 28FR (CATHETERS) IMPLANT
CATH THORACIC 28FR RT ANG (CATHETERS) IMPLANT
CATH THORACIC 36FR (CATHETERS) ×3 IMPLANT
CATH THORACIC 36FR RT ANG (CATHETERS) ×3 IMPLANT
CLIP APPLIE 9.375 MED OPEN (MISCELLANEOUS) IMPLANT
CLIP APPLIE 9.375 SM OPEN (CLIP) IMPLANT
CLIP FOGARTY SPRING 6M (CLIP) IMPLANT
CLIP RETRACTION 3.0MM CORONARY (MISCELLANEOUS) ×3 IMPLANT
CLIP TI MEDIUM 24 (CLIP) IMPLANT
CLIP TI WIDE RED SMALL 24 (CLIP) ×9 IMPLANT
CONN ST 1/4X3/8  BEN (MISCELLANEOUS) ×1
CONN ST 1/4X3/8 BEN (MISCELLANEOUS) ×2 IMPLANT
CONN Y 3/8X3/8X3/8  BEN (MISCELLANEOUS)
CONN Y 3/8X3/8X3/8 BEN (MISCELLANEOUS) IMPLANT
COVER SURGICAL LIGHT HANDLE (MISCELLANEOUS) ×3 IMPLANT
CRADLE DONUT ADULT HEAD (MISCELLANEOUS) ×3 IMPLANT
DERMABOND ADVANCED (GAUZE/BANDAGES/DRESSINGS) ×1
DERMABOND ADVANCED .7 DNX12 (GAUZE/BANDAGES/DRESSINGS) ×2 IMPLANT
DRAIN CHANNEL 32F RND 10.7 FF (WOUND CARE) ×6 IMPLANT
DRAPE CARDIOVASCULAR INCISE (DRAPES) ×1
DRAPE INCISE IOBAN 66X45 STRL (DRAPES) ×3 IMPLANT
DRAPE ISOLATION BAG 18X18 (DRAPES) ×1
DRAPE SLUSH/WARMER DISC (DRAPES) ×3 IMPLANT
DRAPE SRG 135X102X78XABS (DRAPES) ×2 IMPLANT
DRSG AQUACEL AG ADV 3.5X14 (GAUZE/BANDAGES/DRESSINGS) ×3 IMPLANT
DRSG COVADERM 4X14 (GAUZE/BANDAGES/DRESSINGS) ×3 IMPLANT
ELECT BLADE 4.0 EZ CLEAN MEGAD (MISCELLANEOUS) ×3
ELECT REM PT RETURN 9FT ADLT (ELECTROSURGICAL) ×6
ELECTRODE BLDE 4.0 EZ CLN MEGD (MISCELLANEOUS) ×2 IMPLANT
ELECTRODE REM PT RTRN 9FT ADLT (ELECTROSURGICAL) ×4 IMPLANT
GAUZE SPONGE 4X4 12PLY STRL (GAUZE/BANDAGES/DRESSINGS) ×6 IMPLANT
GLOVE BIO SURGEON STRL SZ 6 (GLOVE) IMPLANT
GLOVE BIO SURGEON STRL SZ 6.5 (GLOVE) IMPLANT
GLOVE BIO SURGEON STRL SZ7 (GLOVE) IMPLANT
GLOVE BIO SURGEON STRL SZ7.5 (GLOVE) IMPLANT
GLOVE BIOGEL M 6.5 STRL (GLOVE) ×12 IMPLANT
GLOVE BIOGEL M STER SZ 6 (GLOVE) ×12 IMPLANT
GLOVE BIOGEL M STRL SZ7.5 (GLOVE) ×6 IMPLANT
GLOVE BIOGEL PI IND STRL 6 (GLOVE) IMPLANT
GLOVE BIOGEL PI IND STRL 6.5 (GLOVE) IMPLANT
GLOVE BIOGEL PI IND STRL 7.0 (GLOVE) ×14 IMPLANT
GLOVE BIOGEL PI IND STRL 8 (GLOVE) ×4 IMPLANT
GLOVE BIOGEL PI INDICATOR 6 (GLOVE)
GLOVE BIOGEL PI INDICATOR 6.5 (GLOVE)
GLOVE BIOGEL PI INDICATOR 7.0 (GLOVE) ×7
GLOVE BIOGEL PI INDICATOR 8 (GLOVE) ×2
GLOVE EUDERMIC 7 POWDERFREE (GLOVE) IMPLANT
GLOVE ORTHO TXT STRL SZ7.5 (GLOVE) ×6 IMPLANT
GOWN STRL REUS W/ TWL LRG LVL3 (GOWN DISPOSABLE) ×20 IMPLANT
GOWN STRL REUS W/TWL LRG LVL3 (GOWN DISPOSABLE) ×10
HEMOSTAT POWDER SURGIFOAM 1G (HEMOSTASIS) ×9 IMPLANT
INSERT FOGARTY 61MM (MISCELLANEOUS) IMPLANT
INSERT FOGARTY XLG (MISCELLANEOUS) ×3 IMPLANT
KIT BASIN OR (CUSTOM PROCEDURE TRAY) ×3 IMPLANT
KIT ROOM TURNOVER OR (KITS) ×3 IMPLANT
KIT SUCTION CATH 14FR (SUCTIONS) ×15 IMPLANT
KIT VASOVIEW W/TROCAR VH 2000 (KITS) ×3 IMPLANT
LEAD PACING MYOCARDI (MISCELLANEOUS) ×3 IMPLANT
MARKER GRAFT CORONARY BYPASS (MISCELLANEOUS) ×9 IMPLANT
NS IRRIG 1000ML POUR BTL (IV SOLUTION) ×15 IMPLANT
PACK OPEN HEART (CUSTOM PROCEDURE TRAY) ×3 IMPLANT
PAD ARMBOARD 7.5X6 YLW CONV (MISCELLANEOUS) ×3 IMPLANT
PAD ELECT DEFIB RADIOL ZOLL (MISCELLANEOUS) ×3 IMPLANT
PENCIL BUTTON HOLSTER BLD 10FT (ELECTRODE) ×3 IMPLANT
PUNCH AORTIC ROT 4.0MM RCL 40 (MISCELLANEOUS) ×3 IMPLANT
PUNCH AORTIC ROTATE 4.0MM (MISCELLANEOUS) IMPLANT
PUNCH AORTIC ROTATE 4.5MM 8IN (MISCELLANEOUS) IMPLANT
PUNCH AORTIC ROTATE 5MM 8IN (MISCELLANEOUS) IMPLANT
SET CARDIOPLEGIA MPS 5001102 (MISCELLANEOUS) ×3 IMPLANT
SOLUTION ANTI FOG 6CC (MISCELLANEOUS) ×3 IMPLANT
SPONGE GAUZE 4X4 12PLY STER LF (GAUZE/BANDAGES/DRESSINGS) ×6 IMPLANT
SPONGE LAP 18X18 X RAY DECT (DISPOSABLE) IMPLANT
SPONGE LAP 4X18 X RAY DECT (DISPOSABLE) IMPLANT
SUT BONE WAX W31G (SUTURE) ×3 IMPLANT
SUT ETHIBOND X763 2 0 SH 1 (SUTURE) ×6 IMPLANT
SUT MNCRL AB 3-0 PS2 18 (SUTURE) ×6 IMPLANT
SUT MNCRL AB 4-0 PS2 18 (SUTURE) ×3 IMPLANT
SUT PDS AB 1 CTX 36 (SUTURE) ×6 IMPLANT
SUT PROLENE 2 0 SH DA (SUTURE) IMPLANT
SUT PROLENE 3 0 SH DA (SUTURE) ×6 IMPLANT
SUT PROLENE 3 0 SH1 36 (SUTURE) IMPLANT
SUT PROLENE 4 0 RB 1 (SUTURE) ×6
SUT PROLENE 4 0 SH DA (SUTURE) IMPLANT
SUT PROLENE 4-0 RB1 .5 CRCL 36 (SUTURE) ×12 IMPLANT
SUT PROLENE 5 0 C 1 36 (SUTURE) IMPLANT
SUT PROLENE 6 0 C 1 30 (SUTURE) ×6 IMPLANT
SUT PROLENE 7.0 RB 3 (SUTURE) ×9 IMPLANT
SUT PROLENE 8 0 BV175 6 (SUTURE) IMPLANT
SUT PROLENE BLUE 7 0 (SUTURE) ×3 IMPLANT
SUT PROLENE POLY MONO (SUTURE) IMPLANT
SUT SILK  1 MH (SUTURE) ×1
SUT SILK 1 MH (SUTURE) ×2 IMPLANT
SUT STEEL 6MS V (SUTURE) IMPLANT
SUT STEEL STERNAL CCS#1 18IN (SUTURE) ×3 IMPLANT
SUT STEEL SZ 6 DBL 3X14 BALL (SUTURE) ×6 IMPLANT
SUT VIC AB 1 CTX 18 (SUTURE) ×3 IMPLANT
SUT VIC AB 1 CTX 36 (SUTURE)
SUT VIC AB 1 CTX36XBRD ANBCTR (SUTURE) IMPLANT
SUT VIC AB 2-0 CT1 27 (SUTURE) ×1
SUT VIC AB 2-0 CT1 TAPERPNT 27 (SUTURE) ×2 IMPLANT
SUT VIC AB 2-0 CTX 27 (SUTURE) ×3 IMPLANT
SUT VIC AB 3-0 SH 27 (SUTURE)
SUT VIC AB 3-0 SH 27X BRD (SUTURE) IMPLANT
SUT VIC AB 3-0 X1 27 (SUTURE) IMPLANT
SUT VICRYL 4-0 PS2 18IN ABS (SUTURE) IMPLANT
SUTURE E-PAK OPEN HEART (SUTURE) ×3 IMPLANT
SYSTEM SAHARA CHEST DRAIN ATS (WOUND CARE) ×3 IMPLANT
TAPE CLOTH SURG 4X10 WHT LF (GAUZE/BANDAGES/DRESSINGS) ×6 IMPLANT
TOWEL OR 17X24 6PK STRL BLUE (TOWEL DISPOSABLE) ×6 IMPLANT
TOWEL OR 17X26 10 PK STRL BLUE (TOWEL DISPOSABLE) ×6 IMPLANT
TRAY FOLEY IC TEMP SENS 16FR (CATHETERS) ×3 IMPLANT
TUBING INSUFFLATION (TUBING) ×3 IMPLANT
UNDERPAD 30X30 INCONTINENT (UNDERPADS AND DIAPERS) ×3 IMPLANT
WATER STERILE IRR 1000ML POUR (IV SOLUTION) ×6 IMPLANT

## 2014-09-22 NOTE — Progress Notes (Signed)
Patient ID: Sara Hobbs, female   DOB: July 23, 1961, 54 y.o.   MRN: [id number]  SICU Evening Rounds:  Hemodynamically stable  CI 1.5 on no drips. Probably still needs some volume.  Still on vent. Ready to wean.  Urine output adequate.  CT output low  BMET    Component Value Date/Time   NA 138 09/22/2014 1346   K 3.4* 09/22/2014 1346   CL 99 09/22/2014 1212   CO2 22 09/20/2014 1638   GLUCOSE 191* 09/22/2014 1346   BUN 12 09/22/2014 1212   CREATININE 0.50 09/22/2014 1212   CALCIUM 9.8 09/20/2014 1638   GFRNONAA 66* 09/20/2014 1638   GFRAA 77* 09/20/2014 1638    CBC    Component Value Date/Time   WBC 16.3* 09/22/2014 1300   RBC 4.13 09/22/2014 1300   HGB 12.6 09/22/2014 1346   HCT 37.0 09/22/2014 1346   PLT 178 09/22/2014 1300   MCV 89.3 09/22/2014 1300   MCH 31.0 09/22/2014 1300   MCHC 34.7 09/22/2014 1300   RDW 12.9 09/22/2014 1300

## 2014-09-22 NOTE — Op Note (Signed)
CARDIOTHORACIC SURGERY OPERATIVE NOTE  Date of Procedure: 09/22/2014  Preoperative Diagnosis:   Severe 3-vessel Coronary Artery Disease  Unstable Angina  Postoperative Diagnosis: Same  Procedure:    Coronary Artery Bypass Grafting x 2   Left Internal Mammary Artery to Distal Left Anterior Descending Coronary Artery  Saphenous Vein Graft to Posterior Descending Coronary Artery  Endoscopic Vein Harvest from Right Thigh  Surgeon: Salvatore Decent. Cornelius Moras, MD  Assistant: Lowella Dandy, PA-C and Avis Epley, PA-S  Anesthesia: Arta Bruce, MD  Operative Findings:  Normal LV systolic function  Small caliber but o/w good quality LIMA conduit for grafting  Good quality SVG conduit for grafting  Small coronary arteries with diffuse coronary artery disease  Diagonal branch too small and diffusely diseased for grafting     BRIEF CLINICAL NOTE AND INDICATIONS FOR SURGERY  Patient is an obese 54 year old female with history of coronary artery disease with previous myocardial infarction and PCI and stenting in March 2015, poorly controlled type 2 diabetes mellitus with multiple complications, long-standing heavy tobacco abuse with COPD, and chronic pain for which she has been narcotic dependent who has been referred for a second surgical opinion to discuss possible coronary artery bypass grafting for treatment of severe multivessel coronary artery disease. The patient's cardiac history dates back to March 2015 when she was hospitalized at [facility] with an acute non-ST segment elevation myocardial infarction. She underwent diagnostic cardiac catheterization with subsequent PCI and stenting of the left circumflex coronary artery using a drug-eluting stent. She was noted to have multivessel coronary artery disease at the time, although disease involving the left anterior descending coronary artery and the right coronary artery territories was felt to be only moderate and  not flow-limiting at that time. The patient states that she has continued to experience symptoms of chest discomfort ever since last March without any period of complete relief. She describes substernal chest pain or chest tightness with exertion that has continued to progress in severity over several months. She occasionally has episodes of chest discomfort with minimal activity and at rest. Symptoms are usually relieved by administration of sublingual nitroglycerin. She has not had any prolonged episodes of chest discomfort that were unrelieved by nitroglycerin. She has been compliant with medical therapy including dual antiplatelet therapy using both aspirin and Brilinta. She does not experience shortness of breath in the absence of ongoing symptoms of chest pain. She denies any history of PND, orthopnea, palpitations, dizzy spells, or syncope. She has had some mild lower extremity edema. Because of persistent symptoms suggestive of angina pectoris, the patient underwent dobutamine stress echocardiogram that suggested the presence of inferior wall ischemia. She underwent follow-up diagnostic cardiac catheterization by Dr. Rhona Leavens on 09/01/2014 at [facility]. Catheterization demonstrated continued patency of the stent in the left circumflex coronary artery with significant progression of disease in the other vascular territory including high-grade stenosis of the left anterior descending coronary artery involving bifurcation with the diagonal branch, and log segment subtotal occlusion of the mid and distal right coronary artery. Left ventricular systolic function appeared normal, and this was additionally confirmed on follow-up transthoracic echocardiogram. The patient was evaluated for possible surgical revascularization, but at the patient's request the patient has now been referred for a second opinion.  The patient has been seen in consultation and counseled at length regarding the  indications, risks and potential benefits of surgery.  All questions have been answered, and the patient provides full informed consent for the  operation as described.    DETAILS OF THE OPERATIVE PROCEDURE  Preparation:  The patient is brought to the operating room on the above mentioned date and central monitoring was established by the anesthesia team including placement of Swan-Ganz catheter and radial arterial line. The patient is placed in the supine position on the operating table.  Intravenous antibiotics are administered. General endotracheal anesthesia is induced uneventfully. A Foley catheter is placed.  Baseline transesophageal echocardiogram was performed.  Findings were notable for normal LV systolic function.  The patient's chest, abdomen, both groins, and both lower extremities are prepared and draped in a sterile manner. A time out procedure is performed.   Surgical Approach and Conduit Harvest:  A median sternotomy incision was performed and the left internal mammary artery is dissected from the chest wall and prepared for bypass grafting. The left internal mammary artery is notably small caliber but otherwise good quality conduit. Simultaneously, saphenous vein is obtained from the patient's right thigh using endoscopic vein harvest technique. The saphenous vein is notably good quality conduit. After removal of the saphenous vein, the small surgical incisions in the lower extremity are closed with absorbable suture. Following systemic heparinization, the left internal mammary artery was transected distally noted to have excellent flow.   Extracorporeal Cardiopulmonary Bypass and Myocardial Protection:  The pericardium is opened. The ascending aorta is normal in appearance. The ascending aorta and the right atrium are cannulated for cardioplegia bypass.  Adequate heparinization is verified.    The entire pre-bypass portion of the operation was notable for stable  hemodynamics.  Cardiopulmonary bypass was begun and the surface of the heart is inspected. Distal target vessels are selected for coronary artery bypass grafting. A cardioplegia cannula is placed in the ascending aorta.  A temperature probe was placed in the interventricular septum.  The patient is allowed to cool passively to [facility] systemic temperature.  The aortic cross clamp is applied and cold blood cardioplegia is delivered initially in an antegrade fashion through the aortic root.   Iced saline slush is applied for topical hypothermia.  The initial cardioplegic arrest is rapid with early diastolic arrest.  Repeat doses of cardioplegia are administered intermittently throughout the entire cross clamp portion of the operation through the aortic root and through subsequently placed vein grafts in order to maintain completely flat electrocardiogram and septal myocardial temperature below 15C.  Myocardial protection was felt to be excellent.  Coronary Artery Bypass Grafting:   The posterior descending branch of the right coronary artery was grafted using a reversed saphenous vein graft in an end-to-side fashion.  At the site of distal anastomosis the target vessel was good quality and measured approximately 1.5 mm in diameter.  The diagonal branch of the left anterior descending coronary artery was examined carefully but noted to be too small and diffusely diseased for grafting  The distal left anterior coronary artery was grafted with the left internal mammary artery in an end-to-side fashion.  At the site of distal anastomosis the target vessel was diffusely diseased, relatively poor quality and measured approximately 1.2 mm in diameter.  All proximal vein graft anastomoses were placed directly to the ascending aorta prior to removal of the aortic cross clamp.  The septal myocardial temperature rose rapidly after reperfusion of the left internal mammary artery graft.  The aortic cross clamp was  removed after a total cross clamp time of 53 minutes.   Procedure Completion:  All proximal and distal coronary anastomoses were inspected for hemostasis and appropriate  graft orientation. Epicardial pacing wires are fixed to the right ventricular outflow tract and to the right atrial appendage. The patient is rewarmed to 37C temperature. The patient is weaned and disconnected from cardiopulmonary bypass.  The patient's rhythm at separation from bypass was sinus.  The patient was weaned from cardiopulmonary bypass without any inotropic support. Total cardiopulmonary bypass time for the operation was 73 minutes.  Followup transesophageal echocardiogram performed after separation from bypass revealed no changes from the preoperative exam.  The aortic and venous cannula were removed uneventfully. Protamine was administered to reverse the anticoagulation. The mediastinum and pleural space were inspected for hemostasis and irrigated with saline solution. The mediastinum and the left pleural space were drained using 3 chest tubes placed through separate stab incisions inferiorly.  The soft tissues anterior to the aorta were reapproximated loosely. The sternum is closed with double strength sternal wire. The soft tissues anterior to the sternum were closed in multiple layers and the skin is closed with a running subcuticular skin closure.  The post-bypass portion of the operation was notable for stable rhythm and hemodynamics.  No blood products were administered during the operation.   Disposition:  The patient tolerated the procedure well and is transported to the surgical intensive care in stable condition. There are no intraoperative complications. All sponge instrument and needle counts are verified correct at completion of the operation.    Salvatore Decent. Cornelius Moras MD 09/22/2014 1:10 PM

## 2014-09-22 NOTE — Progress Notes (Signed)
RN called re: ABG results. Increased set rate to 16 per ABG results.  RN aware.

## 2014-09-22 NOTE — Progress Notes (Signed)
Set rate increased to 20 per MD order, Per ABG results. RN aware.

## 2014-09-22 NOTE — OR Nursing (Signed)
SICU First call @ 1220

## 2014-09-22 NOTE — Progress Notes (Signed)
  Echocardiogram Echocardiogram Transesophageal has been performed.  Sara Hobbs FRANCES 09/22/2014, 9:33 AM

## 2014-09-22 NOTE — Interval H&P Note (Signed)
History and Physical Interval Note:  09/22/2014 7:55 AM  Sara Hobbs  has presented today for surgery, with the diagnosis of CAD  The various methods of treatment have been discussed with the patient and family. After consideration of risks, benefits and other options for treatment, the patient has consented to  Procedure(s): CORONARY ARTERY BYPASS GRAFTING (CABG) (N/A) TRANSESOPHAGEAL ECHOCARDIOGRAM (TEE) (N/A) as a surgical intervention .  The patient's history has been reviewed, patient examined, no change in status, stable for surgery.  I have reviewed the patient's chart and labs.  Questions were answered to the patient's satisfaction.     Lynnsey Barbara H

## 2014-09-22 NOTE — Anesthesia Preprocedure Evaluation (Addendum)
Anesthesia Evaluation  Patient identified by MRN, date of birth, ID band Patient awake    Reviewed: Allergy & Precautions, NPO status , Patient's Chart, lab work & pertinent test results, reviewed documented beta blocker date and time   Airway Mallampati: II  TM Distance: >3 FB Neck ROM: Full    Dental  (+) Edentulous Upper, Edentulous Lower   Pulmonary COPD COPD inhaler, Current Smoker,          Cardiovascular hypertension, Pt. on home beta blockers + angina with exertion + CAD, + Past MI and + Cardiac Stents Rhythm:Regular     Neuro/Psych  Headaches, PSYCHIATRIC DISORDERS Anxiety Depression negative psych ROS   GI/Hepatic Neg liver ROS, hiatal hernia, GERD-  ,  Endo/Other  diabetes, Poorly Controlled, Type 2, Insulin DependentHypothyroidism   Renal/GU negative Renal ROS     Musculoskeletal  (+) Arthritis -,   Abdominal   Peds  Hematology negative hematology ROS (+)   Anesthesia Other Findings   Reproductive/Obstetrics negative OB ROS                           Anesthesia Physical Anesthesia Plan  ASA: IV  Anesthesia Plan: General   Post-op Pain Management:    Induction: Intravenous  Airway Management Planned: Oral ETT  Additional Equipment: Arterial line, PA Cath, CVP, Ultrasound Guidance Line Placement and TEE  Intra-op Plan:   Post-operative Plan: Post-operative intubation/ventilation  Informed Consent: I have reviewed the patients History and Physical, chart, labs and discussed the procedure including the risks, benefits and alternatives for the proposed anesthesia with the patient or authorized representative who has indicated his/her understanding and acceptance.   Dental advisory given  Plan Discussed with: CRNA and Surgeon  Anesthesia Plan Comments:       Anesthesia Quick Evaluation

## 2014-09-22 NOTE — Progress Notes (Signed)
Pt waking up, follows commands. Returned rate to 12 per SICU vent protocol.  Plans to attempt SICU wean soon.  RN aware.

## 2014-09-22 NOTE — Brief Op Note (Addendum)
09/22/2014  11:37 AM  PATIENT:  Sara Hobbs  54 y.o. female  PRE-OPERATIVE DIAGNOSIS:  CAD  POST-OPERATIVE DIAGNOSIS:  CAD  PROCEDURE:  Procedure(s) with comments:  CORONARY ARTERY BYPASS GRAFTING x 2 -LIMA to LAD -SVG to PDA  ENDOSCOPIC SAPHENOUS VEIN HARVEST RIGHT THIGH  TRANSESOPHAGEAL ECHOCARDIOGRAM (TEE) (N/A)  SURGEON:    Purcell Nails, MD  ASSISTANTS:  Lowella Dandy, PA-C and Avis Epley, PA-S  ANESTHESIA:   Arta Bruce, MD  CROSSCLAMP TIME:   19'  CARDIOPULMONARY BYPASS TIME: 21'  FINDINGS:  Normal LV systolic function  Small caliber but o/w good quality LIMA conduit for grafting  Good quality SVG conduit for grafting  Small coronary arteries with diffuse coronary artery disease  Diagonal branch too small and diffusely diseased for grafting  COMPLICATIONS: None  BASELINE WEIGHT: 95 kg  PATIENT DISPOSITION:   TO SICU IN STABLE CONDITION  Sara Hobbs 09/22/2014 1:05 PM

## 2014-09-22 NOTE — Anesthesia Postprocedure Evaluation (Signed)
Anesthesia Post Note  Patient: Sara Hobbs  Procedure(s) Performed: Procedure(s) (LRB): CORONARY ARTERY BYPASS GRAFTING (CABG) (N/A) TRANSESOPHAGEAL ECHOCARDIOGRAM (TEE) (N/A)  Anesthesia type: General  Patient location: ICU  Post pain: Pain level controlled  Post assessment: Post-op Vital signs reviewed  Last Vitals:  Filed Vitals:   09/22/14 1630  BP:   Pulse: 87  Temp: 37.4 C  Resp: 16    Post vital signs: stable  Level of consciousness: Patient remains intubated per anesthesia plan  Complications: No apparent anesthesia complications

## 2014-09-22 NOTE — Transfer of Care (Signed)
Immediate Anesthesia Transfer of Care Note  Patient: Sara Hobbs  Procedure(s) Performed: Procedure(s) with comments: CORONARY ARTERY BYPASS GRAFTING (CABG) (N/A) - Times 2 using left internal mammary artery and endoscopically harvested right saphenous vein TRANSESOPHAGEAL ECHOCARDIOGRAM (TEE) (N/A)  Patient Location: SICU  Anesthesia Type:General  Level of Consciousness: Patient remains intubated per anesthesia plan  Airway & Oxygen Therapy: Patient remains intubated per anesthesia plan  Post-op Assessment: Report given to PACU RN  Post vital signs: Reviewed and stable  Complications: No apparent anesthesia complications

## 2014-09-22 NOTE — Procedures (Signed)
Extubation Procedure Note  Patient Details:   Name: Sara Hobbs DOB: 15-Nov-1960 MRN: [id number]   Airway Documentation:   Pre extubation: Pt completed rapid wean protocol without complication. ABG within acceptable range, NIF -60, VC 800 cc. Cuff leak not present, MD notified OK'd to extubate.  Post extubation: Pt extubated to 2 lpm [name], 97% SAT. Pt able to speak name and location. Pt coughs to clear secretions, clear BBS, no stridor noted. Pt resting comfortably at this time, RT will cont to monitor.   Evaluation  O2 sats: stable throughout Complications: No apparent complications Patient did tolerate procedure well. Bilateral Breath Sounds: Clear   Yes  Suszanne Finch 09/22/2014, 10:36 PM

## 2014-09-22 NOTE — Anesthesia Procedure Notes (Signed)
Procedure Name: Intubation Date/Time: 09/22/2014 8:55 AM Performed by: Dairl Ponder Pre-anesthesia Checklist: Patient identified, Timeout performed, Emergency Drugs available, Suction available and Patient being monitored Patient Re-evaluated:Patient Re-evaluated prior to inductionOxygen Delivery Method: Circle system utilized Preoxygenation: Pre-oxygenation with 100% oxygen Intubation Type: IV induction Ventilation: Mask ventilation without difficulty Grade View: Grade I Tube type: Oral Tube size: 8.0 mm Number of attempts: 1 Placement Confirmation: ETT inserted through vocal cords under direct vision,  breath sounds checked- equal and bilateral and positive ETCO2 Secured at: 23 cm Tube secured with: Tape Dental Injury: Teeth and Oropharynx as per pre-operative assessment

## 2014-09-22 NOTE — Progress Notes (Signed)
Utilization Review Completed.Sara Hobbs T1/20/2016  

## 2014-09-23 ENCOUNTER — Encounter (HOSPITAL_COMMUNITY): Payer: Self-pay | Admitting: Thoracic Surgery (Cardiothoracic Vascular Surgery)

## 2014-09-23 ENCOUNTER — Inpatient Hospital Stay (HOSPITAL_COMMUNITY): Payer: Self-pay

## 2014-09-23 LAB — CBC
HCT: 34.8 % — ABNORMAL LOW (ref 36.0–46.0)
HEMATOCRIT: 33.6 % — AB (ref 36.0–46.0)
HEMOGLOBIN: 12 g/dL (ref 12.0–15.0)
HEMOGLOBIN: 11.4 g/dL — AB (ref 12.0–15.0)
MCH: 30.4 pg (ref 26.0–34.0)
MCH: 30.5 pg (ref 26.0–34.0)
MCHC: 34.5 g/dL (ref 30.0–36.0)
MCHC: 33.9 g/dL (ref 30.0–36.0)
MCV: 88.1 fL (ref 78.0–100.0)
MCV: 89.8 fL (ref 78.0–100.0)
Platelets: 166 10*3/uL (ref 150–400)
Platelets: 123 10*3/uL — ABNORMAL LOW (ref 150–400)
RBC: 3.95 MIL/uL (ref 3.87–5.11)
RBC: 3.74 MIL/uL — ABNORMAL LOW (ref 3.87–5.11)
RDW: 13.5 % (ref 11.5–15.5)
RDW: 13.8 % (ref 11.5–15.5)
WBC: 12.5 10*3/uL — ABNORMAL HIGH (ref 4.0–10.5)
WBC: 9.7 10*3/uL (ref 4.0–10.5)

## 2014-09-23 LAB — POCT I-STAT 3, ART BLOOD GAS (G3+)
Acid-base deficit: 1 mmol/L (ref 0.0–2.0)
BICARBONATE: 24.4 meq/L — AB (ref 20.0–24.0)
O2 Saturation: 95 %
PCO2 ART: 40.6 mmHg (ref 35.0–45.0)
PH ART: 7.384 (ref 7.350–7.450)
PO2 ART: 75 mmHg — AB (ref 80.0–100.0)
Patient temperature: 36.6
TCO2: 26 mmol/L (ref 0–100)

## 2014-09-23 LAB — POCT I-STAT, CHEM 8
BUN: 13 mg/dL (ref 6–23)
CHLORIDE: 100 meq/L (ref 96–112)
CREATININE: 0.6 mg/dL (ref 0.50–1.10)
Calcium, Ion: 1.04 mmol/L — ABNORMAL LOW (ref 1.12–1.23)
GLUCOSE: 148 mg/dL — AB (ref 70–99)
HCT: 35 % — ABNORMAL LOW (ref 36.0–46.0)
Hemoglobin: 11.9 g/dL — ABNORMAL LOW (ref 12.0–15.0)
POTASSIUM: 3.9 mmol/L (ref 3.5–5.1)
Sodium: 136 mmol/L (ref 135–145)
TCO2: 23 mmol/L (ref 0–100)

## 2014-09-23 LAB — BASIC METABOLIC PANEL
Anion gap: 5 (ref 5–15)
BUN: 12 mg/dL (ref 6–23)
CALCIUM: 7.5 mg/dL — AB (ref 8.4–10.5)
CO2: 26 mmol/L (ref 19–32)
CREATININE: 0.51 mg/dL (ref 0.50–1.10)
Chloride: 107 mEq/L (ref 96–112)
GFR calc non Af Amer: >90 mL/min (ref >90)
Glucose, Bld: 99 mg/dL (ref 70–99)
Potassium: 3.9 mmol/L (ref 3.5–5.1)
Sodium: 138 mmol/L (ref 135–145)

## 2014-09-23 LAB — GLUCOSE, CAPILLARY
GLUCOSE-CAPILLARY: 100 mg/dL — AB (ref 70–99)
GLUCOSE-CAPILLARY: 120 mg/dL — AB (ref 70–99)
GLUCOSE-CAPILLARY: 171 mg/dL — AB (ref 70–99)
GLUCOSE-CAPILLARY: 211 mg/dL — AB (ref 70–99)
GLUCOSE-CAPILLARY: 87 mg/dL (ref 70–99)
GLUCOSE-CAPILLARY: 93 mg/dL (ref 70–99)
GLUCOSE-CAPILLARY: 97 mg/dL (ref 70–99)
Glucose-Capillary: 92 mg/dL (ref 70–99)
Glucose-Capillary: 95 mg/dL (ref 70–99)
Glucose-Capillary: 99 mg/dL (ref 70–99)
Glucose-Capillary: 161 mg/dL — ABNORMAL HIGH (ref 70–99)
Glucose-Capillary: 85 mg/dL (ref 70–99)

## 2014-09-23 LAB — MAGNESIUM
Magnesium: 2.5 mg/dL (ref 1.5–2.5)
Magnesium: 2.1 mg/dL (ref 1.5–2.5)

## 2014-09-23 LAB — CREATININE, SERUM
Creatinine, Ser: 0.73 mg/dL (ref 0.50–1.10)
GFR calc Af Amer: >90 mL/min (ref >90)
GFR calc non Af Amer: >90 mL/min (ref >90)

## 2014-09-23 LAB — BLOOD GAS, ARTERIAL
ACID-BASE DEFICIT: 0.5 mmol/L (ref 0.0–2.0)
Bicarbonate: 23.6 mEq/L (ref 20.0–24.0)
Drawn by: 43098
FIO2: 0.28 %
O2 SAT: 96.2 %
PCO2 ART: 38.7 mmHg (ref 35.0–45.0)
Patient temperature: 98.6
TCO2: 24.8 mmol/L (ref 0–100)
pH, Arterial: 7.403 (ref 7.350–7.450)
pO2, Arterial: 83.8 mmHg (ref 80.0–100.0)

## 2014-09-23 MED ORDER — CARVEDILOL 6.25 MG PO TABS
6.2500 mg | ORAL_TABLET | Freq: Two times a day (BID) | ORAL | Status: DC
Start: 2014-09-23 — End: 2014-09-27
  Administered 2014-09-23 – 2014-09-27 (×9): 6.25 mg via ORAL
  Filled 2014-09-23 (×11): qty 1

## 2014-09-23 MED ORDER — CHLORHEXIDINE GLUCONATE 0.12 % MT SOLN
15.0000 mL | Freq: Two times a day (BID) | OROMUCOSAL | Status: DC
  Administered 2014-09-23 – 2014-09-26 (×7): 15 mL via OROMUCOSAL
  Filled 2014-09-23 (×11): qty 15

## 2014-09-23 MED ORDER — INSULIN DETEMIR 100 UNIT/ML ~~LOC~~ SOLN
20.0000 [IU] | Freq: Two times a day (BID) | SUBCUTANEOUS | Status: DC
  Administered 2014-09-23 – 2014-09-25 (×6): 20 [IU] via SUBCUTANEOUS
  Filled 2014-09-23 (×9): qty 0.2

## 2014-09-23 MED ORDER — LIVING WELL WITH DIABETES BOOK
Freq: Once | Status: AC
  Administered 2014-09-23: 14:00:00
  Filled 2014-09-23: qty 1

## 2014-09-23 MED ORDER — FUROSEMIDE 10 MG/ML IJ SOLN
20.0000 mg | Freq: Four times a day (QID) | INTRAMUSCULAR | Status: AC
  Administered 2014-09-23 (×3): 20 mg via INTRAVENOUS
  Filled 2014-09-23 (×3): qty 2

## 2014-09-23 MED ORDER — INSULIN ASPART 100 UNIT/ML ~~LOC~~ SOLN
0.0000 [IU] | SUBCUTANEOUS | Status: DC
  Administered 2014-09-23: 4 [IU] via SUBCUTANEOUS
  Administered 2014-09-23: 8 [IU] via SUBCUTANEOUS
  Administered 2014-09-24: 2 [IU] via SUBCUTANEOUS

## 2014-09-23 MED ORDER — MORPHINE SULFATE 2 MG/ML IJ SOLN
2.0000 mg | INTRAMUSCULAR | Status: DC | PRN
  Administered 2014-09-24 – 2014-09-26 (×2): 2 mg via INTRAVENOUS
  Filled 2014-09-23 (×2): qty 1

## 2014-09-23 MED ORDER — CETYLPYRIDINIUM CHLORIDE 0.05 % MT LIQD
7.0000 mL | Freq: Two times a day (BID) | OROMUCOSAL | Status: DC
  Administered 2014-09-23 – 2014-09-24 (×3): 7 mL via OROMUCOSAL

## 2014-09-23 MED ORDER — FENOFIBRATE 160 MG PO TABS
160.0000 mg | ORAL_TABLET | Freq: Every day | ORAL | Status: DC
  Administered 2014-09-24 – 2014-09-27 (×4): 160 mg via ORAL
  Filled 2014-09-23 (×4): qty 1

## 2014-09-23 MED ORDER — ATORVASTATIN CALCIUM 80 MG PO TABS
80.0000 mg | ORAL_TABLET | Freq: Every day | ORAL | Status: DC
  Administered 2014-09-24 – 2014-09-26 (×3): 80 mg via ORAL
  Filled 2014-09-23 (×4): qty 1

## 2014-09-23 MED ORDER — POTASSIUM CHLORIDE 10 MEQ/50ML IV SOLN
10.0000 meq | INTRAVENOUS | Status: AC
  Administered 2014-09-23 (×2): 10 meq via INTRAVENOUS
  Filled 2014-09-23 (×2): qty 50

## 2014-09-23 MED FILL — Sodium Bicarbonate IV Soln 8.4%: INTRAVENOUS | Qty: 50 | Status: AC

## 2014-09-23 MED FILL — Heparin Sodium (Porcine) Inj 1000 Unit/ML: INTRAMUSCULAR | Qty: 10 | Status: AC

## 2014-09-23 MED FILL — Electrolyte-R (PH 7.4) Solution: INTRAVENOUS | Qty: 4000 | Status: AC

## 2014-09-23 MED FILL — Lidocaine HCl IV Inj 20 MG/ML: INTRAVENOUS | Qty: 5 | Status: AC

## 2014-09-23 MED FILL — Sodium Chloride IV Soln 0.9%: INTRAVENOUS | Qty: 2000 | Status: AC

## 2014-09-23 MED FILL — Dexmedetomidine HCl in NaCl 0.9% IV Soln 400 MCG/100ML: INTRAVENOUS | Qty: 100 | Status: AC

## 2014-09-23 MED FILL — Magnesium Sulfate Inj 50%: INTRAMUSCULAR | Qty: 10 | Status: AC

## 2014-09-23 MED FILL — Mannitol IV Soln 20%: INTRAVENOUS | Qty: 500 | Status: AC

## 2014-09-23 MED FILL — Potassium Chloride Inj 2 mEq/ML: INTRAVENOUS | Qty: 40 | Status: AC

## 2014-09-23 MED FILL — Heparin Sodium (Porcine) Inj 1000 Unit/ML: INTRAMUSCULAR | Qty: 30 | Status: AC

## 2014-09-23 NOTE — Progress Notes (Signed)
      [address]       [name] [address]             [phone]        CARDIOTHORACIC SURGERY PROGRESS NOTE   R1 Day Post-Op Procedure(s) (LRB): CORONARY ARTERY BYPASS GRAFTING (CABG) (N/A) TRANSESOPHAGEAL ECHOCARDIOGRAM (TEE) (N/A)  Subjective: Doing well.  Mild soreness in chest.  Asking for water to drink.  No SOB  Objective: Vital signs: BP Readings from Last 1 Encounters:  09/23/14 123/77   Pulse Readings from Last 1 Encounters:  09/23/14 89   Resp Readings from Last 1 Encounters:  09/23/14 16   Temp Readings from Last 1 Encounters:  09/23/14 98.2 F (36.8 C)     Hemodynamics: PAP: (33-49)/(18-34) 46/29 mmHg CO:  [2.9 L/min-4.2 L/min] 3.6 L/min CI:  [1.5 L/min/m2-2.1 L/min/m2] 1.8 L/min/m2  Physical Exam:  Rhythm:   sinus  Breath sounds: clear  Heart sounds:  RRR  Incisions:  Dressings dry, intact  Abdomen:  Soft, non-distended, non-tender  Extremities:  Warm, well-perfused  Chest tubes:  Low volume thin serosanguinous output   Intake/Output from previous day: 01/20 0701 - 01/21 0700 In: 7336.3 [I.V.:4959.3; Blood:527; IV Piggyback:1850] Out: 3465 [Urine:1785; Emesis/NG output:100; Blood:950; Chest Tube:630] Intake/Output this shift:    Lab Results:  CBC: Recent Labs  09/22/14 2020 09/23/14 0420  WBC 12.0* 12.5*  HGB 12.1 12.0  HCT 34.5* 34.8*  PLT 162 166    BMET:  Recent Labs  09/20/14 1638  09/22/14 2010 09/22/14 2020 09/23/14 0420  NA 134*  < > 140  --  138  K 4.2  < > 3.9  --  3.9  CL 98  < > 104  --  107  CO2 22  --   --   --  26  GLUCOSE 326*  < > 139*  --  99  BUN 18  < > 11  --  12  CREATININE 0.96  < > 0.40* 0.56 0.51  CALCIUM 9.8  --   --   --  7.5*  < > = values in this interval not displayed.   CBG (last 3)   Recent Labs  09/23/14 0423 09/23/14 0554 09/23/14 0652  GLUCAP 97 92 93    ABG    Component Value Date/Time   PHART 7.403 09/23/2014 0500   PCO2ART 38.7 09/23/2014 0500   PO2ART 83.8 09/23/2014 0500   HCO3 23.6 09/23/2014 0500   TCO2 24.8 09/23/2014 0500   ACIDBASEDEF 0.5 09/23/2014 0500   O2SAT 96.2 09/23/2014 0500    CXR: Mild bibasilar atelectasis L>R  EKG: NSR w/out acute ischemic changes     Assessment/Plan: S/P Procedure(s) (LRB): CORONARY ARTERY BYPASS GRAFTING (CABG) (N/A) TRANSESOPHAGEAL ECHOCARDIOGRAM (TEE) (N/A)  Doing well POD1 Maintaining NSR w/ stable hemodynamics off all drips except NTG for BP Expected post op acute blood loss anemia, mild, stable Expected post op atelectasis, mild Expected post op volume excess, mild Type II diabetes mellitus, excellent glycemic control on insulin drip   Mobilize  Diuresis  Restart Coreg  Add levemir insulin and wean drip  Leave chest tubes in for now, possibly d/c later today or in am  Transfer step down this afternoon if progressing well   OWEN,CLARENCE H 09/23/2014 7:46 AM

## 2014-09-23 NOTE — Plan of Care (Signed)
Problem: Phase II - Intermediate Post-Op Goal: Wean to Extubate Outcome: Completed/Met Date Met:  09/23/14 Pt extubated, remains on 1L of O2 via [place] Goal: Activity Progressed Outcome: Completed/Met Date Met:  09/23/14 Pt ambulated in hallway

## 2014-09-23 NOTE — Progress Notes (Signed)
Inpatient Diabetes Program Recommendations  AACE/ADA: New Consensus Statement on Inpatient Glycemic Control (2013)  Target Ranges:  Prepandial:   less than 140 mg/dL      Peak postprandial:   less than 180 mg/dL (1-2 hours)      Critically ill patients:  140 - 180 mg/dL   Reason for Assessment:  Note elevated A1C=11.3%.  Patient was on Lantus 58 units bid and Invokana 300 mg daily.  Will need follow-up with PCP for elevated A1C.  Ordered patient Living well with Diabetes booklet for review when she is feeling better.  Note she is transitioning off insulin drip today to Levemir 20 units bid.  Will follow.  Thanks, Beryl Meager, RN, BC-ADM Inpatient Diabetes Coordinator Pager [phone]

## 2014-09-23 NOTE — Progress Notes (Signed)
TCTS BRIEF SICU PROGRESS NOTE  1 Day Post-Op  S/P Procedure(s) (LRB): CORONARY ARTERY BYPASS GRAFTING (CABG) (N/A) TRANSESOPHAGEAL ECHOCARDIOGRAM (TEE) (N/A)   Stable day Up in chair, ambulated some w/ assistance NSR w/ stable BP O2 sats 92-94% Diuresing fairly well  Plan: Continue routine care  Sara Hobbs H 09/23/2014 8:27 PM

## 2014-09-24 ENCOUNTER — Inpatient Hospital Stay (HOSPITAL_COMMUNITY): Payer: Self-pay

## 2014-09-24 LAB — CBC
HCT: 33.5 % — ABNORMAL LOW (ref 36.0–46.0)
Hemoglobin: 11.2 g/dL — ABNORMAL LOW (ref 12.0–15.0)
MCH: 30.6 pg (ref 26.0–34.0)
MCHC: 33.4 g/dL (ref 30.0–36.0)
MCV: 91.5 fL (ref 78.0–100.0)
Platelets: 129 10*3/uL — ABNORMAL LOW (ref 150–400)
RBC: 3.66 MIL/uL — ABNORMAL LOW (ref 3.87–5.11)
RDW: 14.1 % (ref 11.5–15.5)
WBC: 9.5 10*3/uL (ref 4.0–10.5)

## 2014-09-24 LAB — GLUCOSE, CAPILLARY
GLUCOSE-CAPILLARY: 153 mg/dL — AB (ref 70–99)
GLUCOSE-CAPILLARY: 136 mg/dL — AB (ref 70–99)
Glucose-Capillary: 100 mg/dL — ABNORMAL HIGH (ref 70–99)
Glucose-Capillary: 119 mg/dL — ABNORMAL HIGH (ref 70–99)
Glucose-Capillary: 140 mg/dL — ABNORMAL HIGH (ref 70–99)

## 2014-09-24 LAB — BASIC METABOLIC PANEL
ANION GAP: 11 (ref 5–15)
BUN: 14 mg/dL (ref 6–23)
CALCIUM: 7.6 mg/dL — AB (ref 8.4–10.5)
CO2: 27 mmol/L (ref 19–32)
Chloride: 100 mEq/L (ref 96–112)
Creatinine, Ser: 0.68 mg/dL (ref 0.50–1.10)
GFR calc non Af Amer: >90 mL/min (ref >90)
Glucose, Bld: 110 mg/dL — ABNORMAL HIGH (ref 70–99)
Potassium: 3.4 mmol/L — ABNORMAL LOW (ref 3.5–5.1)
SODIUM: 138 mmol/L (ref 135–145)

## 2014-09-24 MED ORDER — INSULIN ASPART 100 UNIT/ML ~~LOC~~ SOLN
0.0000 [IU] | Freq: Three times a day (TID) | SUBCUTANEOUS | Status: DC
  Administered 2014-09-24 (×3): 2 [IU] via SUBCUTANEOUS
  Administered 2014-09-25: 4 [IU] via SUBCUTANEOUS
  Administered 2014-09-25: 8 [IU] via SUBCUTANEOUS
  Administered 2014-09-25 – 2014-09-26 (×3): 4 [IU] via SUBCUTANEOUS
  Administered 2014-09-26: 8 [IU] via SUBCUTANEOUS
  Administered 2014-09-26: 2 [IU] via SUBCUTANEOUS

## 2014-09-24 MED ORDER — SODIUM CHLORIDE 0.9 % IV SOLN: 250.0000 mL | INTRAVENOUS | Status: DC | PRN

## 2014-09-24 MED ORDER — INFLUENZA VAC SPLIT QUAD 0.5 ML IM SUSY: 0.5000 mL | PREFILLED_SYRINGE | INTRAMUSCULAR | Status: DC | PRN

## 2014-09-24 MED ORDER — FUROSEMIDE 10 MG/ML IJ SOLN
40.0000 mg | Freq: Once | INTRAMUSCULAR | Status: AC
  Administered 2014-09-24: 40 mg via INTRAVENOUS
  Filled 2014-09-24: qty 4

## 2014-09-24 MED ORDER — POTASSIUM CHLORIDE 10 MEQ/50ML IV SOLN
10.0000 meq | INTRAVENOUS | Status: AC
  Administered 2014-09-24 (×3): 10 meq via INTRAVENOUS
  Filled 2014-09-24 (×2): qty 50

## 2014-09-24 MED ORDER — POTASSIUM CHLORIDE CRYS ER 10 MEQ PO TBCR: 30.0000 meq | EXTENDED_RELEASE_TABLET | Freq: Once | ORAL | Status: DC

## 2014-09-24 MED ORDER — POTASSIUM CHLORIDE CRYS ER 20 MEQ PO TBCR: 20.0000 meq | EXTENDED_RELEASE_TABLET | Freq: Once | ORAL | Status: DC

## 2014-09-24 MED ORDER — POTASSIUM CHLORIDE CRYS ER 20 MEQ PO TBCR
20.0000 meq | EXTENDED_RELEASE_TABLET | Freq: Once | ORAL | Status: DC
  Filled 2014-09-24: qty 1

## 2014-09-24 MED ORDER — ASPIRIN EC 81 MG PO TBEC
81.0000 mg | DELAYED_RELEASE_TABLET | Freq: Every day | ORAL | Status: DC
  Administered 2014-09-24 – 2014-09-27 (×4): 81 mg via ORAL
  Filled 2014-09-24 (×4): qty 1

## 2014-09-24 MED ORDER — SODIUM CHLORIDE 0.9 % IJ SOLN: 3.0000 mL | INTRAMUSCULAR | Status: DC | PRN

## 2014-09-24 MED ORDER — POTASSIUM CHLORIDE 10 MEQ/50ML IV SOLN
10.0000 meq | INTRAVENOUS | Status: AC
  Administered 2014-09-24 (×3): 10 meq via INTRAVENOUS
  Filled 2014-09-24: qty 50

## 2014-09-24 MED ORDER — PNEUMOCOCCAL VAC POLYVALENT 25 MCG/0.5ML IJ INJ: 0.5000 mL | INJECTION | INTRAMUSCULAR | Status: DC | PRN

## 2014-09-24 MED ORDER — TICAGRELOR 90 MG PO TABS
90.0000 mg | ORAL_TABLET | Freq: Two times a day (BID) | ORAL | Status: DC
  Administered 2014-09-25 – 2014-09-27 (×5): 90 mg via ORAL
  Filled 2014-09-24 (×6): qty 1

## 2014-09-24 MED ORDER — POTASSIUM CHLORIDE CRYS ER 20 MEQ PO TBCR
20.0000 meq | EXTENDED_RELEASE_TABLET | Freq: Every day | ORAL | Status: DC
  Filled 2014-09-24: qty 1

## 2014-09-24 MED ORDER — MOVING RIGHT ALONG BOOK
Freq: Once | Status: AC
  Administered 2014-09-24: 13:00:00
  Filled 2014-09-24: qty 1

## 2014-09-24 MED ORDER — FUROSEMIDE 40 MG PO TABS
40.0000 mg | ORAL_TABLET | Freq: Every day | ORAL | Status: AC
  Administered 2014-09-25 – 2014-09-27 (×3): 40 mg via ORAL
  Filled 2014-09-24 (×3): qty 1

## 2014-09-24 MED ORDER — MOMETASONE FURO-FORMOTEROL FUM 100-5 MCG/ACT IN AERO
2.0000 | INHALATION_SPRAY | Freq: Two times a day (BID) | RESPIRATORY_TRACT | Status: DC
  Administered 2014-09-24 – 2014-09-27 (×7): 2 via RESPIRATORY_TRACT
  Filled 2014-09-24: qty 8.8

## 2014-09-24 MED ORDER — SODIUM CHLORIDE 0.9 % IJ SOLN
3.0000 mL | Freq: Two times a day (BID) | INTRAMUSCULAR | Status: DC
  Administered 2014-09-24 – 2014-09-26 (×4): 3 mL via INTRAVENOUS

## 2014-09-24 MED ORDER — CANAGLIFLOZIN 300 MG PO TABS
300.0000 mg | ORAL_TABLET | Freq: Every day | ORAL | Status: DC
  Administered 2014-09-25 – 2014-09-27 (×3): 300 mg via ORAL
  Filled 2014-09-24 (×4): qty 300

## 2014-09-24 NOTE — Progress Notes (Addendum)
[facility] DAILY ICU PROGRESS NOTE                   [address]            [name] [address]          [phone]   2 Days Post-Op Procedure(s) (LRB): CORONARY ARTERY BYPASS GRAFTING (CABG) (N/A) TRANSESOPHAGEAL ECHOCARDIOGRAM (TEE) (N/A)  Total Length of Stay:  LOS: 2 days   Subjective: Patient with nausea this am. No abdominal pain or emesis.  Objective: Vital signs in last 24 hours: Temp:  [97.6 F (36.4 C)-98.6 F (37 C)] 98.3 F (36.8 C) (01/22 0412) Pulse Rate:  [84-94] 94 (01/22 0700) Cardiac Rhythm:  [-] Normal sinus rhythm (01/22 0800) Resp:  [11-31] 18 (01/22 0700) BP: (97-130)/(47-92) 130/65 mmHg (01/22 0700) SpO2:  [91 %-100 %] 97 % (01/22 0700) Arterial Line BP: (124-159)/(55-78) 124/59 mmHg (01/21 1300) Weight:  [223 lb 5.2 oz (101.3 kg)] 223 lb 5.2 oz (101.3 kg) (01/22 0600)  Filed Weights   09/22/14 1845 09/23/14 0445 09/24/14 0600  Weight: 203 lb 14.8 oz (92.5 kg) 227 lb 4.7 oz (103.1 kg) 223 lb 5.2 oz (101.3 kg)    Weight change: 19 lb 6.4 oz (8.8 kg)   Hemodynamic parameters for last 24 hours: PAP: (27-48)/(13-32) 34/21 mmHg  Intake/Output from previous day: 01/21 0701 - 01/22 0700 In: 1494.2 [P.O.:960; I.V.:284.2; IV Piggyback:250] Out: 3835 [Urine:3385; Chest Tube:450]  Intake/Output this shift: Total I/O In: -  Out: 160 [Urine:140; Chest Tube:20]  Current Meds: Scheduled Meds: . acetaminophen  1,000 mg Oral 4 times per day  . antiseptic oral rinse  7 mL Mouth Rinse q12n4p  . aspirin EC  325 mg Oral Daily  . atorvastatin  80 mg Oral q1800  . bisacodyl  10 mg Oral Daily   Or  . bisacodyl  10 mg Rectal Daily  . carvedilol  6.25 mg Oral BID WC  . chlorhexidine  15 mL Mouth Rinse BID  . Chlorhexidine Gluconate Cloth  6 each Topical Q0600  . docusate sodium  200 mg Oral Daily  . fenofibrate  160 mg Oral Daily  . insulin aspart  0-24 Units Subcutaneous 6 times per day  . insulin detemir  20 Units Subcutaneous BID  .  insulin regular  0-10 Units Intravenous TID WC  . mupirocin ointment  1 application Nasal BID  . pantoprazole  40 mg Oral Daily  . potassium chloride  10 mEq Intravenous Q1 Hr x 3  . sodium chloride  3 mL Intravenous Q12H  . tiotropium  18 mcg Inhalation Daily   Continuous Infusions: . sodium chloride    . insulin (NOVOLIN-R) infusion 7.8 Units/hr (09/23/14 1040)   PRN Meds:.albuterol, Influenza vac split quadrivalent PF, metoprolol, morphine injection, ondansetron (ZOFRAN) IV, oxyCODONE, pneumococcal 23 valent vaccine, sodium chloride, traMADol  General appearance: alert, cooperative and no distress Neurologic: intact Heart: RRR, rub with chest tubes in place Lungs: Diminished at bases bilaterally Abdomen: soft, non-tender; bowel sounds normal; no masses,  no organomegaly Extremities: Bilateral LE edema Wound: Aquacel in place;right LE dressing is dry  Lab Results: CBC: Recent Labs  09/23/14 1740 09/24/14 0500  WBC 9.7 9.5  HGB 11.4* 11.2*  HCT 33.6* 33.5*  PLT 123* 129*   BMET:  Recent Labs  09/23/14 0420 09/23/14 1739 09/23/14 1740 09/24/14 0500  NA 138 136  --  138  K 3.9 3.9  --  3.4*  CL 107 100  --  100  CO2 26  --   --  27  GLUCOSE 99 148*  --  110*  BUN 12 13  --  14  CREATININE 0.51 0.60 0.73 0.68  CALCIUM 7.5*  --   --  7.6*    PT/INR:  Recent Labs  09/22/14 1300  LABPROT 15.5*  INR 1.22   Radiology: Dg Chest Port 1 View  09/24/2014   CLINICAL DATA:  Status post coronary artery bypass grafting.  EXAM: PORTABLE CHEST - 1 VIEW  COMPARISON:  September 23, 2014  FINDINGS: The Swan-Ganz catheter is been removed. Cordis tip is in the superior vena cava. Mediastinal drain remains as does left chest tube. No pneumothorax. Lungs are clear. Heart is slightly enlarged but stable. The pulmonary vascularity is normal. No adenopathy. Patient is status post coronary artery bypass grafting.  IMPRESSION: Tube and catheter positions as described without pneumothorax.  Lungs clear. Heart prominent but stable.   Electronically Signed   By: Bretta Bang M.D.   On: 09/24/2014 07:59   Dg Chest Port 1 View  09/23/2014   CLINICAL DATA:  Coronary artery disease and post CABG.  EXAM: PORTABLE CHEST - 1 VIEW  COMPARISON:  09/2014  FINDINGS: The endotracheal tube and nasogastric tube have been removed. Left chest tube and mediastinal drain are still present. Negative for a pneumothorax. Persistent linear densities in the medial upper lungs are probably related to areas of atelectasis. Slightly improved aeration in the right upper lung. Heart size is normal. Swan-Ganz catheter tip in the distal pulmonary artery region.  IMPRESSION: Post CABG changes with slightly improved aeration in the upper lungs.  Chest drains without a pneumothorax.   Electronically Signed   By: Richarda Overlie M.D.   On: 09/23/2014 07:54   Dg Chest Port 1 View  09/22/2014   CLINICAL DATA:  Post open heart surgery  EXAM: PORTABLE CHEST - 1 VIEW  COMPARISON:  09/20/2014  FINDINGS: Borderline cardiomegaly. The patient is status post median sternotomy. Endotracheal tube in place with tip 2.8 cm above the carina. NG tube in place. There is a lower mediastinal drain. Left chest tube in place. No pneumothorax. Central mild vascular congestion without pulmonary edema. No segmental infiltrate. Right IJ Swan-Ganz catheter with tip in the region of main pulmonary artery.  IMPRESSION: Status post medial sternotomy. Endotracheal tube and NG tube in place. Right IJ Swan-Ganz catheter in place. Left chest tube. No pneumothorax. Central mild vascular congestion without convincing pulmonary edema. No segmental infiltrate.   Electronically Signed   By: Natasha Mead M.D.   On: 09/22/2014 13:56     Assessment/Plan: S/P Procedure(s) (LRB): CORONARY ARTERY BYPASS GRAFTING (CABG) (N/A) TRANSESOPHAGEAL ECHOCARDIOGRAM (TEE) (N/A)  1.CV-SR in the 90's. On Coreg 6.25 bid 2.Pulmonary-Chest tubes to suction. 470 cc last 24 hours.  Possibly remove one chest tube. On 1 liter of oxygen via [place]. Will wean as tolerates. CXR shows no pneumothorax, small bilateral pleural effusions. Encourage incentive spirometer. 3.Volume overload-give Lasix 40 IV this am 4.ABL anemia- H and H stable at 11.2 and 33.5 5. Mild thrombocytopenia-platelets up to 129,000 6. Supplement potassium-will be given 3 runs and oral 7. CBGs 211/120/100. On Insulin.Pre op HGA1C 11.3 8. Zofran PRN nausea 9. Consider transfer to [facility]  Ardelle Balls PA-C 09/24/2014 8:21 AM  I have seen and examined the patient and agree with the assessment and plan as outlined.  Doing well POD2.  D/C tubes and foley.  Mobilize.  Supplement potassium.  Continue gentle diuresis.  Restart Brilinta tomorrow and decrease ASA to 81 mg/day.  Restart Invokana and continue levemir + SSI.   Transfer 2W tele.  Add low dose ACE-I in 1-2 days if BP will tolerate.  Annaleah Arata H 09/24/2014 9:35 AM

## 2014-09-24 NOTE — Progress Notes (Signed)
Report received from Marilynne Halsted

## 2014-09-25 ENCOUNTER — Inpatient Hospital Stay (HOSPITAL_COMMUNITY): Payer: MEDICAID

## 2014-09-25 LAB — BASIC METABOLIC PANEL
Anion gap: 9 (ref 5–15)
BUN: 12 mg/dL (ref 6–23)
CALCIUM: 8.1 mg/dL — AB (ref 8.4–10.5)
CHLORIDE: 99 mmol/L (ref 96–112)
CO2: 29 mmol/L (ref 19–32)
CREATININE: 0.74 mg/dL (ref 0.50–1.10)
GLUCOSE: 109 mg/dL — AB (ref 70–99)
Potassium: 3.4 mmol/L — ABNORMAL LOW (ref 3.5–5.1)
SODIUM: 137 mmol/L (ref 135–145)

## 2014-09-25 LAB — GLUCOSE, CAPILLARY
GLUCOSE-CAPILLARY: 111 mg/dL — AB (ref 70–99)
GLUCOSE-CAPILLARY: 174 mg/dL — AB (ref 70–99)
GLUCOSE-CAPILLARY: 205 mg/dL — AB (ref 70–99)
Glucose-Capillary: 162 mg/dL — ABNORMAL HIGH (ref 70–99)

## 2014-09-25 LAB — CBC
HEMATOCRIT: 32.6 % — AB (ref 36.0–46.0)
Hemoglobin: 10.9 g/dL — ABNORMAL LOW (ref 12.0–15.0)
MCH: 30.3 pg (ref 26.0–34.0)
MCHC: 33.4 g/dL (ref 30.0–36.0)
MCV: 90.6 fL (ref 78.0–100.0)
PLATELETS: 135 10*3/uL — AB (ref 150–400)
RBC: 3.6 MIL/uL — AB (ref 3.87–5.11)
RDW: 14 % (ref 11.5–15.5)
WBC: 8.1 10*3/uL (ref 4.0–10.5)

## 2014-09-25 MED ORDER — LISINOPRIL 2.5 MG PO TABS
2.5000 mg | ORAL_TABLET | Freq: Every day | ORAL | Status: DC
  Administered 2014-09-25 – 2014-09-27 (×3): 2.5 mg via ORAL
  Filled 2014-09-25 (×4): qty 1

## 2014-09-25 MED ORDER — POTASSIUM CHLORIDE CRYS ER 20 MEQ PO TBCR
40.0000 meq | EXTENDED_RELEASE_TABLET | Freq: Two times a day (BID) | ORAL | Status: AC
  Administered 2014-09-25 (×2): 40 meq via ORAL
  Filled 2014-09-25 (×2): qty 2

## 2014-09-25 NOTE — Progress Notes (Signed)
Patient walked independently in the hallway with her husband. Reviewed use of incentive spirometer and reminded patient about Sternal precautions will follow up with the patient on Monday.

## 2014-09-25 NOTE — Progress Notes (Addendum)
[address]       [place] [address]             [phone]      3 Days Post-Op Procedure(s) (LRB): CORONARY ARTERY BYPASS GRAFTING (CABG) (N/A) TRANSESOPHAGEAL ECHOCARDIOGRAM (TEE) (N/A) Subjective: Feels ok overall, some soreness   Objective: Vital signs in last 24 hours: Temp:  [97.4 F (36.3 C)-98.6 F (37 C)] 98.4 F (36.9 C) (01/23 0421) Pulse Rate:  [89-96] 91 (01/23 0421) Cardiac Rhythm:  [-] Normal sinus rhythm (01/22 2136) Resp:  [13-21] 18 (01/23 0421) BP: (109-132)/(61-81) 132/67 mmHg (01/23 0421) SpO2:  [95 %-97 %] 97 % (01/23 0421) Weight:  [218 lb 11.1 oz (99.2 kg)] 218 lb 11.1 oz (99.2 kg) (01/23 0421)  Hemodynamic parameters for last 24 hours:    Intake/Output from previous day: 01/22 0701 - 01/23 0700 In: 1190 [P.O.:1190] Out: 2905 [Urine:2865; Chest Tube:40] Intake/Output this shift:    General appearance: alert, cooperative and no distress Heart: regular rate and rhythm and occas extrasystole Lungs: mildly dim in the bases Abdomen: benign Extremities: mild edema Wound: sternal dressing in place, evh site ok  Lab Results:  Recent Labs  09/24/14 0500 09/25/14 0342  WBC 9.5 8.1  HGB 11.2* 10.9*  HCT 33.5* 32.6*  PLT 129* 135*   BMET:  Recent Labs  09/24/14 0500 09/25/14 0342  NA 138 137  K 3.4* 3.4*  CL 100 99  CO2 27 29  GLUCOSE 110* 109*  BUN 14 12  CREATININE 0.68 0.74  CALCIUM 7.6* 8.1*    PT/INR:  Recent Labs  09/22/14 1300  LABPROT 15.5*  INR 1.22   ABG    Component Value Date/Time   PHART 7.403 09/23/2014 0500   HCO3 23.6 09/23/2014 0500   TCO2 23 09/23/2014 1739   ACIDBASEDEF 0.5 09/23/2014 0500   O2SAT 96.2 09/23/2014 0500   CBG (last 3)   Recent Labs  09/24/14 1610 09/24/14 2108 09/25/14 0620  GLUCAP 140* 136* 111*    Meds Scheduled Meds: . acetaminophen  1,000 mg Oral 4 times per day  . antiseptic oral rinse  7 mL Mouth Rinse q12n4p  . aspirin EC  81 mg Oral Daily    . atorvastatin  80 mg Oral q1800  . bisacodyl  10 mg Oral Daily   Or  . bisacodyl  10 mg Rectal Daily  . canagliflozin  300 mg Oral QAC breakfast  . carvedilol  6.25 mg Oral BID WC  . chlorhexidine  15 mL Mouth Rinse BID  . Chlorhexidine Gluconate Cloth  6 each Topical Q0600  . docusate sodium  200 mg Oral Daily  . fenofibrate  160 mg Oral Daily  . furosemide  40 mg Oral Daily  . insulin aspart  0-24 Units Subcutaneous TID AC & HS  . insulin detemir  20 Units Subcutaneous BID  . insulin regular  0-10 Units Intravenous TID WC  . mometasone-formoterol  2 puff Inhalation BID  . mupirocin ointment  1 application Nasal BID  . pantoprazole  40 mg Oral Daily  . potassium chloride  20 mEq Oral Daily  . sodium chloride  3 mL Intravenous Q12H  . sodium chloride  3 mL Intravenous Q12H  . ticagrelor  90 mg Oral BID  . tiotropium  18 mcg Inhalation Daily   Continuous Infusions: . sodium chloride    . insulin (NOVOLIN-R) infusion 7.8 Units/hr (09/23/14 1040)   PRN Meds:.sodium chloride, albuterol, Influenza vac split quadrivalent  PF, metoprolol, morphine injection, ondansetron (ZOFRAN) IV, oxyCODONE, pneumococcal 23 valent vaccine, sodium chloride, sodium chloride, traMADol  Xrays Dg Chest 2 View  09/25/2014   CLINICAL DATA:  Atelectasis.  CABG 09/22/2014  EXAM: CHEST  2 VIEW  COMPARISON:  09/24/2014  FINDINGS: Stable and normal heart size and aortic contours after CABG. There are trace bilateral pleural effusions. No significant atelectasis. No pulmonary edema.  IMPRESSION: Trace pleural effusions.   Electronically Signed   By: Tiburcio Pea M.D.   On: 09/25/2014 06:42   Dg Chest Port 1 View  09/24/2014   CLINICAL DATA:  Status post coronary artery bypass grafting.  EXAM: PORTABLE CHEST - 1 VIEW  COMPARISON:  September 23, 2014  FINDINGS: The Swan-Ganz catheter is been removed. Cordis tip is in the superior vena cava. Mediastinal drain remains as does left chest tube. No pneumothorax. Lungs  are clear. Heart is slightly enlarged but stable. The pulmonary vascularity is normal. No adenopathy. Patient is status post coronary artery bypass grafting.  IMPRESSION: Tube and catheter positions as described without pneumothorax. Lungs clear. Heart prominent but stable.   Electronically Signed   By: Bretta Bang M.D.   On: 09/24/2014 07:59    Assessment/Plan: S/P Procedure(s) (LRB): CORONARY ARTERY BYPASS GRAFTING (CABG) (N/A) TRANSESOPHAGEAL ECHOCARDIOGRAM (TEE) (N/A)  1 doing well 2 replace K+ 3 labs otherwise stable 4 cont gentle diuresis 5 sugars controlled on current rx 6 should be able to tolerate low dose ace- will start lisinopril  LOS: 3 days    GOLD,WAYNE E 09/25/2014  I have seen and examined Virl Diamond and agree with the above assessment  and plan.  Delight Ovens MD Beeper [phone] Office [phone] 09/25/2014 11:33 AM

## 2014-09-26 LAB — GLUCOSE, CAPILLARY
GLUCOSE-CAPILLARY: 211 mg/dL — AB (ref 70–99)
Glucose-Capillary: 164 mg/dL — ABNORMAL HIGH (ref 70–99)
Glucose-Capillary: 144 mg/dL — ABNORMAL HIGH (ref 70–99)
Glucose-Capillary: 175 mg/dL — ABNORMAL HIGH (ref 70–99)

## 2014-09-26 MED ORDER — INSULIN DETEMIR 100 UNIT/ML ~~LOC~~ SOLN
30.0000 [IU] | Freq: Two times a day (BID) | SUBCUTANEOUS | Status: DC
  Administered 2014-09-26 – 2014-09-27 (×3): 30 [IU] via SUBCUTANEOUS
  Filled 2014-09-26 (×4): qty 0.3

## 2014-09-26 NOTE — Progress Notes (Signed)
EPWs removed per MD order and protocol. Ends intact. Pt tolerated procedure well. Pt resting in bed X 1 hour, bed alarm on, call bell within reach. Vital signs stable. Will continue to monitor.

## 2014-09-26 NOTE — Progress Notes (Addendum)
[address]       [place] [address]             [phone]      4 Days Post-Op Procedure(s) (LRB): CORONARY ARTERY BYPASS GRAFTING (CABG) (N/A) TRANSESOPHAGEAL ECHOCARDIOGRAM (TEE) (N/A) Subjective: Feels ok, no specific c/o  Objective: Vital signs in last 24 hours: Temp:  [98.3 F (36.8 C)-98.4 F (36.9 C)] 98.4 F (36.9 C) (01/24 0413) Pulse Rate:  [88-91] 89 (01/24 0413) Cardiac Rhythm:  [-] Normal sinus rhythm (01/23 2020) Resp:  [14-18] 18 (01/24 0413) BP: (95-115)/(56-76) 115/70 mmHg (01/24 0413) SpO2:  [94 %-96 %] 96 % (01/24 0413) FiO2 (%):  [1 %] 1 % (01/23 1400) Weight:  [212 lb 11.9 oz (96.5 kg)] 212 lb 11.9 oz (96.5 kg) (01/24 0413)  Hemodynamic parameters for last 24 hours:    Intake/Output from previous day: 01/23 0701 - 01/24 0700 In: 1080 [P.O.:1080] Out: 300 [Urine:300] Intake/Output this shift:    General appearance: alert, cooperative and no distress Heart: regular rate and rhythm Lungs: mildly dim in the bases Abdomen: benign Extremities: min edema Wound: incis healing well  Lab Results:  Recent Labs  09/24/14 0500 09/25/14 0342  WBC 9.5 8.1  HGB 11.2* 10.9*  HCT 33.5* 32.6*  PLT 129* 135*   BMET:  Recent Labs  09/24/14 0500 09/25/14 0342  NA 138 137  K 3.4* 3.4*  CL 100 99  CO2 27 29  GLUCOSE 110* 109*  BUN 14 12  CREATININE 0.68 0.74  CALCIUM 7.6* 8.1*    PT/INR: No results for input(s): LABPROT, INR in the last 72 hours. ABG    Component Value Date/Time   PHART 7.403 09/23/2014 0500   HCO3 23.6 09/23/2014 0500   TCO2 23 09/23/2014 1739   ACIDBASEDEF 0.5 09/23/2014 0500   O2SAT 96.2 09/23/2014 0500   CBG (last 3)   Recent Labs  09/25/14 1614 09/25/14 2131 09/26/14 0611  GLUCAP 174* 205* 164*    Meds Scheduled Meds: . acetaminophen  1,000 mg Oral 4 times per day  . antiseptic oral rinse  7 mL Mouth Rinse q12n4p  . aspirin EC  81 mg Oral Daily  . atorvastatin  80 mg Oral q1800    . bisacodyl  10 mg Oral Daily   Or  . bisacodyl  10 mg Rectal Daily  . canagliflozin  300 mg Oral QAC breakfast  . carvedilol  6.25 mg Oral BID WC  . chlorhexidine  15 mL Mouth Rinse BID  . Chlorhexidine Gluconate Cloth  6 each Topical Q0600  . docusate sodium  200 mg Oral Daily  . fenofibrate  160 mg Oral Daily  . furosemide  40 mg Oral Daily  . insulin aspart  0-24 Units Subcutaneous TID AC & HS  . insulin detemir  20 Units Subcutaneous BID  . insulin regular  0-10 Units Intravenous TID WC  . lisinopril  2.5 mg Oral Daily  . mometasone-formoterol  2 puff Inhalation BID  . mupirocin ointment  1 application Nasal BID  . pantoprazole  40 mg Oral Daily  . sodium chloride  3 mL Intravenous Q12H  . sodium chloride  3 mL Intravenous Q12H  . ticagrelor  90 mg Oral BID  . tiotropium  18 mcg Inhalation Daily   Continuous Infusions: . sodium chloride    . insulin (NOVOLIN-R) infusion 7.8 Units/hr (09/23/14 1040)   PRN Meds:.sodium chloride, albuterol, Influenza vac split quadrivalent PF, metoprolol, morphine injection, ondansetron (  ZOFRAN) IV, oxyCODONE, pneumococcal 23 valent vaccine, sodium chloride, sodium chloride, traMADol  Xrays Dg Chest 2 View  09/25/2014   CLINICAL DATA:  Atelectasis.  CABG 09/22/2014  EXAM: CHEST  2 VIEW  COMPARISON:  09/24/2014  FINDINGS: Stable and normal heart size and aortic contours after CABG. There are trace bilateral pleural effusions. No significant atelectasis. No pulmonary edema.  IMPRESSION: Trace pleural effusions.   Electronically Signed   By: Tiburcio Pea M.D.   On: 09/25/2014 06:42    Assessment/Plan: S/P Procedure(s) (LRB): CORONARY ARTERY BYPASS GRAFTING (CABG) (N/A) TRANSESOPHAGEAL ECHOCARDIOGRAM (TEE) (N/A)  1 conts to feel well 2 sugars a bit high , will increase insulin some 3 repeat labs in am 4 d/c epw's  5 poss home in am    LOS: 4 days    GOLD,WAYNE E 09/26/2014  No fevers Poss home in am I have seen and examined  Virl Diamond and agree with the above assessment  and plan.  Delight Ovens MD Beeper [phone] Office [phone] 09/26/2014 11:02 AM

## 2014-09-26 NOTE — Discharge Summary (Signed)
Physician Discharge Summary  Patient ID: Sara Hobbs MRN: [id number] DOB/AGE: 1960/11/04 54 y.o.  Admit date: 09/22/2014 Discharge date: 09/27/2014  Admission Diagnoses: Coronary artery disease  Discharge Diagnoses:  Principal Problem:   S/P CABG x 2 Active Problems:   COPD (chronic obstructive pulmonary disease)   Diabetes mellitus with complication   CAD, multiple vessel   Chest tightness   Angina pectoris  Patient Active Problem List   Diagnosis Date Noted  . 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 Arthritis   . Back pain, chronic   . Chronic shoulder pain   . COPD (chronic obstructive pulmonary disease)   . Diabetes mellitus with complication   . CAD, multiple vessel   . Hyperlipidemia   . GERD (gastroesophageal reflux disease)   . Chest tightness   . Angina pectoris   . 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 The patient's cardiac history dates back to March 2015 when she was hospitalized at [facility] with an acute non-ST segment elevation myocardial infarction. She underwent diagnostic cardiac catheterization with subsequent PCI and stenting of the left circumflex coronary artery using a drug-eluting stent. She was noted to have multivessel coronary artery disease at the time, although disease involving the left anterior descending coronary artery and the right coronary artery territories was felt to be only moderate and not flow-limiting at that  time. The patient states that she has continued to experience symptoms of chest discomfort ever since last March without any period of complete relief. She describes substernal chest pain or chest tightness with exertion that has continued to progress in severity over several months. She occasionally has episodes of chest discomfort with minimal activity and at rest. Symptoms are usually relieved by administration of sublingual nitroglycerin. She has not had any prolonged episodes of chest discomfort that were unrelieved by nitroglycerin. She has been compliant with medical therapy including dual antiplatelet therapy using both aspirin and Brilinta. She does not experience shortness of breath in the absence of ongoing symptoms of chest pain. She denies any history of PND, orthopnea, palpitations, dizzy spells, or syncope. She has had some mild lower extremity edema. Because of persistent symptoms suggestive of angina pectoris, the patient underwent dobutamine stress echocardiogram that suggested the presence of inferior wall ischemia. She underwent follow-up diagnostic cardiac catheterization by Dr. Rhona Leavens on 09/01/2014 at [facility]. Catheterization demonstrated continued patency of the stent in the left circumflex coronary artery with significant progression of disease in the other vascular territory including high-grade stenosis of the left anterior descending coronary artery involving bifurcation with the diagonal branch, and log segment subtotal occlusion of the mid and distal right coronary artery. Left ventricular systolic function appeared normal, and this was additionally confirmed on follow-up transthoracic echocardiogram. The patient was evaluated for possible surgical revascularization, but at the patient's request the patient has now been referred for a second opinion.  The patient is married and lives locally in [place] with her husband. 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 The patient lives a somewhat sedentary lifestyle. She is limited at this point primarily because of severe exertional chest pain. The patient also remains somewhat limited because of chronic pain in both shoulders, more so on the right than the left. The patient has problems with chronic pain in both lower extremities related to diabetic neuropathy. She checks her blood sugars regularly and states that her hemoglobin A1c has decreased from 14 to 9 when it was checked most recently. She continues to smoke 2 packs of cigarettes daily. The patient was fully evaluated with studies reviewed and was felt to be a candidate for coronary artery surgical revascularization. She was admitted this hospitalization for the procedure.  Discharged Condition: good  Hospital Course: The patient was admitted electively, and on 09/22/2014 taken to the operating room where she underwent the following procedure:  CARDIOTHORACIC SURGERY OPERATIVE NOTE  Date of Procedure:09/22/2014  Preoperative Diagnosis:  Severe 3-vessel Coronary Artery Disease  Unstable Angina  Postoperative Diagnosis:Same  Procedure:   Coronary Artery Bypass Grafting x 2 Left Internal Mammary Artery to Distal Left Anterior Descending Coronary Artery Saphenous Vein Graft to Posterior Descending Coronary Artery Endoscopic Vein Harvest from Right Thigh  Surgeon:Clarence H. Cornelius Moras, MD  Assistant:Rose Hegner, PA-C and Avis Epley, PA-S  Anesthesia:Kevin Michelle Piper, MD  Operative Findings:  Normal LV systolic function  Small caliber but o/w good quality LIMA conduit for grafting  Good quality SVG conduit for grafting  Small coronary arteries with diffuse coronary artery disease  Diagonal branch too small and diffusely diseased for grafting   The patient tolerated the  procedure well and is transported to the surgical intensive care in stable condition. There are no intraoperative complications. All sponge instrument and needle counts are verified correct at completion of the operation.  Post operative Hospital course: Overall the patient has progressed nicely. She was able to be weaned from the ventilator using standard protocols without difficulty. She has remained neurologically intact. All routine lines, monitors and drainage devices have been discontinued in the standard fashion. She is tolerating gradually increasing activities using standard postoperative protocols. Oxygen has been able to be weaned and she is tolerating well with good saturations. Incisions are noted to be healing without evidence of infection. She does have an expected acute blood loss anemia which is stable. She is maintaining normal sinus rhythm without significant ectopy or dysrhythmias. She has a mild postoperative volume overload but has responded well to diuretics. She has some mild incisional discomfort which is responding well to oral pain medications. Overall her status is felt to be tentatively stable for discharge in the next 24-48 hours pending ongoing reevaluation of her recovery.   The patient has been discharged on:   1.Beta Blocker:  Yes [ y  ]                              No   [   ]                              If No, reason:  2.Ace Inhibitor/ARB: Yes [ y  ]                                     No  [    ]  If No, reason:  3.Statin:   Yes [ y  ]                  No  [   ]                  If No, reason:  4.Ecasa:  Yes  Cove.Etienne   ]                  No   [   ]                  If No, reason:  Medications at time of discharge:    Medication List    STOP taking these medications        amoxicillin 500 MG capsule  Commonly known as:  AMOXIL     nitroGLYCERIN 0.4 MG SL tablet  Commonly known as:  NITROSTAT      TAKE these  medications        albuterol 108 (90 BASE) MCG/ACT inhaler  Commonly known as:  PROVENTIL HFA;VENTOLIN HFA  Inhale 2 puffs into the lungs every 6 (six) hours as needed for wheezing or shortness of breath.     aspirin EC 81 MG tablet  Take 81 mg by mouth daily.     atorvastatin 80 MG tablet  Commonly known as:  LIPITOR  Take 80 mg by mouth daily.     canagliflozin 300 MG Tabs tablet  Commonly known as:  INVOKANA  Take 300 mg by mouth daily before breakfast.     carvedilol 6.25 MG tablet  Commonly known as:  COREG  Take 6.25 mg by mouth 2 (two) times daily with a meal.     fenofibrate 145 MG tablet  Commonly known as:  TRICOR  Take 145 mg by mouth daily.     Fluticasone-Salmeterol 100-50 MCG/DOSE Aepb  Commonly known as:  ADVAIR  Inhale 1 puff into the lungs 2 (two) times daily.     furosemide 40 MG tablet  Commonly known as:  LASIX  Take 1 tablet (40 mg total) by mouth daily. For 7 Days     glucose blood test strip  1 each by Other route 3 (three) times daily. Use as instructed     insulin glargine 100 UNIT/ML injection  Commonly known as:  LANTUS  Inject 58 Units into the skin 2 (two) times daily.     Insulin Pen Needle 31G X 8 MM Misc  by Does not apply route. USE AS DIRECTED     lisinopril 2.5 MG tablet  Commonly known as:  PRINIVIL,ZESTRIL  Take 1 tablet (2.5 mg total) by mouth daily.     Oxycodone HCl 10 MG Tabs  Take 10 mg by mouth every 8 (eight) hours as needed (pain).     tapentadol 50 MG Tabs tablet  Commonly known as:  NUCYNTA  Take 50 mg by mouth 4 (four) times daily. 1/2 TO 1 TAB     ticagrelor 90 MG Tabs tablet  Commonly known as:  BRILINTA  Take by mouth 2 (two) times daily.     tiotropium 18 MCG inhalation capsule  Commonly known as:  SPIRIVA  Place 18 mcg into inhaler and inhale daily.        Consults: None  Disposition: Home       Follow-up Information    Follow up with Purcell Nails, MD.   Specialty:  Cardiothoracic Surgery    Why:  The office  will contact you with an appointment to see Dr. Cornelius Moras. Please obtain a chest x-ray 1 hour prior to this appointment and [facility] imaging. [facility] is located in the same office complex as the [facility].   Contact information:   [address] [employer] [address] [place] [address] [phone]       Follow up with Diamond Nickel, MD.   Specialty:  Cardiology   Why:  Please contact cardiology office to arrange for appointment with Dr. Judithe Modest in 2 weeks.   Contact information:   [address] [place] [address] [phone]       Signed: Rowe Clack 09/26/2014, 10:05 AM

## 2014-09-26 NOTE — Progress Notes (Signed)
Pt ambulated in hallway 200 ft with rolling walker on room air. Pt tolerated activity well. Will continue to monitor.

## 2014-09-27 LAB — GLUCOSE, CAPILLARY
Glucose-Capillary: 82 mg/dL (ref 70–99)
Glucose-Capillary: 148 mg/dL — ABNORMAL HIGH (ref 70–99)

## 2014-09-27 MED ORDER — LISINOPRIL 2.5 MG PO TABS: 2.5000 mg | ORAL_TABLET | Freq: Every day | ORAL | Status: DC

## 2014-09-27 MED ORDER — FUROSEMIDE 40 MG PO TABS: 40.0000 mg | ORAL_TABLET | Freq: Every day | ORAL | Status: DC

## 2014-09-27 NOTE — Progress Notes (Signed)
[id number] Pt has been walking with walker and husband. Pt would like rolling walker and BSC for home. Education completed with pt and husband who voiced understanding. Encouraged walking as tolerated and IS. Declined CRP 2. Pt stated she pretty much eats what she wants but knows about diabetic diet. Gave brief heart healthy choices and left diabetic and heart healthy diets. Put on discharge video for pt and husband to view. Pt given smoking cessation handouts and offered fake cigarette. Did not want fake cigarette and does not know for sure if she can quit. Encouraged her to call 1800quitnow if needed. Discussed effects nicotine can have on new grafts.  Luetta Nutting RN BSN 09/27/2014 9:04 AM

## 2014-09-27 NOTE — Progress Notes (Addendum)
      [address]       [place] [address]             [phone]      5 Days Post-Op Procedure(s) (LRB): CORONARY ARTERY BYPASS GRAFTING (CABG) (N/A) TRANSESOPHAGEAL ECHOCARDIOGRAM (TEE) (N/A)   Subjective:  Ms. Cicco has no complaints this morning.  She is concerned about the top of her incision with "bump" that's present.  She feels it's scary looking and is hoping it will go down in size.  I ensured her that is most cases it does decrease in size.  Objective: Vital signs in last 24 hours: Temp:  [97.8 F (36.6 C)-98.8 F (37.1 C)] 97.8 F (36.6 C) (01/25 0511) Pulse Rate:  [80-92] 80 (01/25 0511) Cardiac Rhythm:  [-] Normal sinus rhythm (01/24 2035) Resp:  [16-18] 16 (01/25 0511) BP: (93-129)/(56-78) 101/66 mmHg (01/25 0511) SpO2:  [94 %-100 %] 96 % (01/25 0511) Weight:  [210 lb 1.6 oz (95.3 kg)] 210 lb 1.6 oz (95.3 kg) (01/25 0511)  Intake/Output from previous day: 01/24 0701 - 01/25 0700 In: 360 [P.O.:360] Out: -   General appearance: alert, cooperative and no distress Heart: regular rate and rhythm Lungs: clear to auscultation bilaterally Abdomen: soft, non-tender; bowel sounds normal; no masses,  no organomegaly Extremities: edema trace Wound: clean and dry  Lab Results:  Recent Labs  09/25/14 0342  WBC 8.1  HGB 10.9*  HCT 32.6*  PLT 135*   BMET:  Recent Labs  09/25/14 0342  NA 137  K 3.4*  CL 99  CO2 29  GLUCOSE 109*  BUN 12  CREATININE 0.74  CALCIUM 8.1*    PT/INR: No results for input(s): LABPROT, INR in the last 72 hours. ABG    Component Value Date/Time   PHART 7.403 09/23/2014 0500   HCO3 23.6 09/23/2014 0500   TCO2 23 09/23/2014 1739   ACIDBASEDEF 0.5 09/23/2014 0500   O2SAT 96.2 09/23/2014 0500   CBG (last 3)   Recent Labs  09/26/14 1622 09/26/14 2227 09/27/14 0627  GLUCAP 144* 175* 82    Assessment/Plan: S/P Procedure(s) (LRB): CORONARY ARTERY BYPASS GRAFTING (CABG) (N/A) TRANSESOPHAGEAL  ECHOCARDIOGRAM (TEE) (N/A)  1. CV- NSR good rate and pressure control- continue Coreg, Lisinopril 2. Pulm- off oxygen, continue IS at discharge 3. Renal- weight is 7 lbs above baseline, creatinine has been WNL, continue lasix 4. DM- sugars controlled 5. Dispo- patient stable, will d/c home today   LOS: 5 days    BARRETT, ERIN 09/27/2014  I have seen and examined the patient and agree with the assessment and plan as outlined.  D/C home today.  Dina Warbington H 09/27/2014 8:48 AM

## 2014-09-27 NOTE — Discharge Instructions (Signed)
Endoscopic Saphenous Vein Harvesting °Care After °Refer to this sheet in the next few weeks. These instructions provide you with information on caring for yourself after your procedure. Your health care provider may also give you more specific instructions. Your treatment has been planned according to current medical practices, but problems sometimes occur. Call your health care provider if you have any problems or questions after your procedure. °HOME CARE INSTRUCTIONS °Medicine °· Take whatever pain medicine your surgeon prescribes. Follow the directions carefully. Do not take over-the-counter pain medicine unless your surgeon says it is okay. Some pain medicine can cause bleeding problems for several weeks after surgery. °· Follow your surgeon's instructions about driving. You will probably not be permitted to drive after heart surgery. °· Take any medicines your surgeon prescribes. Any medicines you took before your heart surgery should be checked with your health care provider before you start taking them again. °Wound care °· If your surgeon has prescribed an elastic bandage or stocking, ask how long you should wear it. °· Check the area around your surgical cuts (incisions) whenever your bandages (dressings) are changed. Look for any redness or swelling. °· You will need to return to have the stitches (sutures) or staples taken out. Ask your surgeon when to do that. °· Ask your surgeon when you can shower or bathe. °Activity °· Try to keep your legs raised when you are sitting. °· Do any exercises your health care providers have given you. These may include deep breathing exercises, coughing, walking, or other exercises. °SEEK MEDICAL CARE IF: °· You have any questions about your medicines. °· You have more leg pain, especially if your pain medicine stops working. °· New or growing bruises develop on your leg. °· Your leg swells, feels tight, or becomes red. °· You have numbness in your leg. °SEEK IMMEDIATE  MEDICAL CARE IF: °· Your pain gets much worse. °· Blood or fluid leaks from any of the incisions. °· Your incisions become warm, swollen, or red. °· You have chest pain. °· You have trouble breathing. °· You have a fever. °· You have more pain near your leg incision. °MAKE SURE YOU: °· Understand these instructions. °· Will watch your condition. °· Will get help right away if you are not doing well or get worse. °Document Released: 05/02/2011 Document Revised: 08/25/2013 Document Reviewed: 05/02/2011 °ExitCare® Patient Information ©2015 [employer]. This information is not intended to replace advice given to you by your health care provider. Make sure you discuss any questions you have with your health care provider. °Coronary Artery Bypass Grafting, Care After °These instructions give you information on caring for yourself after your procedure. Your doctor may also give you more specific instructions. Call your doctor if you have any problems or questions after your procedure.  °HOME CARE °· Only take medicine as told by your doctor. Take medicines exactly as told. Do not stop taking medicines or start any new medicines without talking to your doctor first. °· Take your pulse as told by your doctor. °· Do deep breathing as told by your doctor. Use your breathing device (incentive spirometer), if given, to practice deep breathing several times a day. Support your chest with a pillow or your arms when you take deep breaths or cough. °· Keep the area clean, dry, and protected where the surgery cuts (incisions) were made. Remove bandages (dressings) only as told by your doctor. If strips were applied to surgical area, do not take them off. They fall off   on their own. °· Check the surgery area daily for puffiness (swelling), redness, or leaking fluid. °· If surgery cuts were made in your legs: °· Avoid crossing your legs. °· Avoid sitting for long periods of time. Change positions every 30 minutes. °· Raise your legs  when you are sitting. Place them on pillows. °· Wear stockings that help keep blood clots from forming in your legs (compression stockings). °· Only take sponge baths until your doctor says it is okay to take showers. Pat the surgery area dry. Do not rub the surgery area with a washcloth or towel. Do not bathe, swim, or use a hot tub until your doctor says it is okay. °· Eat foods that are high in fiber. These include raw fruits and vegetables, whole grains, beans, and nuts. Choose lean meats. Avoid canned, processed, and fried foods. °· Drink enough fluids to keep your pee (urine) clear or pale yellow. °· Weigh yourself every day. °· Rest and limit activity as told by your doctor. You may be told to: °· Stop any activity if you have chest pain, shortness of breath, changes in heartbeat, or dizziness. Get help right away if this happens. °· Move around often for short amounts of time or take short walks as told by your doctor. Gradually become more active. You may need help to strengthen your muscles and build endurance. °· Avoid lifting, pushing, or pulling anything heavier than 10 pounds (4.5 kg) for at least 6 weeks after surgery. °· Do not drive until your doctor says it is okay. °· Ask your doctor when you can go back to work. °· Ask your doctor when you can begin sexual activity again. °· Follow up with your doctor as told. °GET HELP IF: °· You have puffiness, redness, more pain, or fluid draining from the incision site. °· You have a fever. °· You have puffiness in your ankles or legs. °· You have pain in your legs. °· You gain 2 or more pounds (0.9 kg) a day. °· You feel sick to your stomach (nauseous) or throw up (vomit). °· You have watery poop (diarrhea). °GET HELP RIGHT AWAY IF: °· You have chest pain that goes to your jaw or arms. °· You have shortness of breath. °· You have a fast or irregular heartbeat. °· You notice a "clicking" in your breastbone when you move. °· You have numbness or weakness in  your arms or legs. °· You feel dizzy or light-headed. °MAKE SURE YOU: °· Understand these instructions. °· Will watch your condition. °· Will get help right away if you are not doing well or get worse. °Document Released: 08/25/2013 Document Reviewed: 08/25/2013 °ExitCare® Patient Information ©2015 [employer]. This information is not intended to replace advice given to you by your health care provider. Make sure you discuss any questions you have with your health care provider. ° °

## 2014-10-01 ENCOUNTER — Other Ambulatory Visit: Payer: Self-pay | Admitting: *Deleted

## 2014-10-01 DIAGNOSIS — G8918 Other acute postprocedural pain: Secondary | ICD-10-CM

## 2014-10-01 MED ORDER — OXYCODONE HCL 10 MG PO TABS: 10.0000 mg | ORAL_TABLET | Freq: Three times a day (TID) | ORAL | Status: DC | PRN

## 2014-10-01 NOTE — Telephone Encounter (Signed)
Mr. Fehringer has called for a refill for his wife's Oxycodone s/p CABG. I told him a signed Rx would be available at the office this afternoon and he agreed.

## 2014-10-21 ENCOUNTER — Other Ambulatory Visit: Payer: Self-pay | Admitting: Thoracic Surgery (Cardiothoracic Vascular Surgery)

## 2014-10-21 DIAGNOSIS — Z951 Presence of aortocoronary bypass graft: Secondary | ICD-10-CM

## 2014-10-25 ENCOUNTER — Ambulatory Visit
Admission: RE | Admit: 2014-10-25 | Discharge: 2014-10-25 | Disposition: A | Discharge status: Home / Self Care | Payer: Self-pay | Source: Ambulatory Visit | Attending: Thoracic Surgery (Cardiothoracic Vascular Surgery) | Admitting: Thoracic Surgery (Cardiothoracic Vascular Surgery)

## 2014-10-25 ENCOUNTER — Ambulatory Visit (INDEPENDENT_AMBULATORY_CARE_PROVIDER_SITE_OTHER): Payer: Self-pay | Admitting: Thoracic Surgery (Cardiothoracic Vascular Surgery)

## 2014-10-25 ENCOUNTER — Encounter: Payer: Self-pay | Admitting: Thoracic Surgery (Cardiothoracic Vascular Surgery)

## 2014-10-25 VITALS — BP 90/64 | HR 92 | Resp 16 | Ht 63.0 in | Wt 196.0 lb

## 2014-10-25 DIAGNOSIS — G8918 Other acute postprocedural pain: Secondary | ICD-10-CM

## 2014-10-25 DIAGNOSIS — Z951 Presence of aortocoronary bypass graft: Secondary | ICD-10-CM

## 2014-10-25 DIAGNOSIS — I25119 Atherosclerotic heart disease of native coronary artery with unspecified angina pectoris: Secondary | ICD-10-CM

## 2014-10-25 MED ORDER — OXYCODONE HCL 10 MG PO TABS: 10.0000 mg | ORAL_TABLET | Freq: Three times a day (TID) | ORAL | Status: DC | PRN

## 2014-10-25 NOTE — Progress Notes (Signed)
[address]       [name] [address]             [phone]     CARDIOTHORACIC SURGERY OFFICE NOTE  Referring Provider is Diamond Nickel., MD PCP is Lavell Islam, MD   HPI:  Patient returns for routine follow-up status post coronary artery bypass grafting 2 on 09/22/2014 for severe three-vessel coronary artery disease with unstable angina.  The patient's early postoperative recovery in the hospital was entirely uneventful and she was discharged home on the fifth postoperative day. Since hospital discharge she has continued to do well. She has been seen in follow-up by Dr. Judithe Modest and she returns to our office for routine follow up today.  She still has mild soreness in her chest which has gradually improved. At this point she complains primarily of hypersensitivity involving the skin along the left anterior chest wall typical for patients with left internal mammary artery harvest and grafting. On one or 2 occasions she has felt her sternum click but this has been very limited. Overall she feels well and she is delighted with her progress. She has no shortness of breath. Her appetite is good. She states that her blood sugars have been under fairly good control. She has not been walking very much. She has had some problems with getting her health insurance straightened out, and so far she has not enrolled in outpatient cardiac rehabilitation program.   Current Outpatient Prescriptions  Medication Sig Dispense Refill  . albuterol (PROVENTIL HFA;VENTOLIN HFA) 108 (90 BASE) MCG/ACT inhaler Inhale 2 puffs into the lungs every 6 (six) hours as needed for wheezing or shortness of breath.    Marland Kitchen aspirin EC 81 MG tablet Take 81 mg by mouth daily.    . canagliflozin (INVOKANA) 300 MG TABS tablet Take 300 mg by mouth daily before breakfast.    . carvedilol (COREG) 6.25 MG tablet Take 6.25 mg by mouth 2 (two) times daily with a meal.    . fenofibrate (TRICOR) 145 MG tablet Take  145 mg by mouth daily.    . Fluticasone-Salmeterol (ADVAIR) 100-50 MCG/DOSE AEPB Inhale 1 puff into the lungs 2 (two) times daily.    Marland Kitchen glucose blood test strip 1 each by Other route 3 (three) times daily. Use as instructed    . insulin glargine (LANTUS) 100 UNIT/ML injection Inject 58 Units into the skin 2 (two) times daily.     . Insulin Pen Needle 31G X 8 MM MISC by Does not apply route. USE AS DIRECTED    . lisinopril (PRINIVIL,ZESTRIL) 2.5 MG tablet Take 1 tablet (2.5 mg total) by mouth daily. 30 tablet 3  . Oxycodone HCl 10 MG TABS Take 1 tablet (10 mg total) by mouth every 8 (eight) hours as needed (pain). 40 tablet 0  . tapentadol (NUCYNTA) 50 MG TABS tablet Take 50 mg by mouth 4 (four) times daily. 1/2 TO 1 TAB    . ticagrelor (BRILINTA) 90 MG TABS tablet Take by mouth 2 (two) times daily.    Marland Kitchen tiotropium (SPIRIVA) 18 MCG inhalation capsule Place 18 mcg into inhaler and inhale daily.     No current facility-administered medications for this visit.      Physical Exam:   BP 90/64 mmHg  Pulse 92  Resp 16  Ht 5\' 3"  (1.6 m)  Wt 196 lb (88.905 kg)  BMI 34.73 kg/m2  SpO2 98%  General:  Well-appearing  Chest:   Clear  CV:   Regular rate and rhythm without murmur  Incisions:  Healing nicely, sternum is stable  Abdomen:  Soft and nontender  Extremities:  Warm and well-perfused  Diagnostic Tests:  CHEST 2 VIEW  COMPARISON: 09/25/2014 and earlier.  FINDINGS: Resolved small pleural effusions. Sequelae of CABG. Normal cardiac size and mediastinal contours. Visualized tracheal air column is within normal limits. No pneumothorax, pulmonary edema, pleural effusion or confluent pulmonary opacity. Stable visualized osseous structures. Stable cholecystectomy clips.  IMPRESSION: Resolved small pleural effusions. No new cardiopulmonary abnormality.   Electronically Signed  By: Odessa Fleming M.D.  On: 10/25/2014 09:50   Impression:  Patient is doing very well  approximately 4 weeks status post coronary artery bypass grafting.  Plan:  I have encouraged the patient to continue to gradually increase her physical activity as tolerated with her primary limitation remaining that she refrain from heavy lifting or strenuous use of her arms and shoulders for at least another 2 months. I have encouraged the patient to enroll in outpatient cardiac rehabilitation program. We have not made any changes to the patient's current medications. I have given the patient a refill prescription for oxycodone to use as needed. All of her questions have been addressed. The patient will return for routine follow-up in 3 months.    Salvatore Decent. Cornelius Moras, MD 10/25/2014 10:09 AM

## 2014-10-25 NOTE — Patient Instructions (Signed)
The patient should continue to avoid any heavy lifting or strenuous use of arms or shoulders for at least a total of three months from the time of surgery.  The patient is encouraged to enroll and participate in the outpatient cardiac rehab program beginning as soon as practical.  The patient may return to driving an automobile as long as they are no longer requiring oral narcotic pain relievers during the daytime.  It would be wise to start driving only short distances during the daylight and gradually increase from there as they feel comfortable.  The patient is reminded to make every effort to keep their diabetes under very tight control.  They should follow up closely with their primary care physician or endocrinologist and strive to keep their hemoglobin A1c levels as low as possible, preferably near or below 6.0

## 2014-11-22 ENCOUNTER — Encounter (HOSPITAL_COMMUNITY): Payer: Self-pay | Admitting: General Practice

## 2014-11-22 ENCOUNTER — Inpatient Hospital Stay (HOSPITAL_COMMUNITY)
Admission: AD | Admit: 2014-11-22 | Discharge: 2014-11-26 | DRG: 858 | Disposition: A | Discharge status: Home Health | Payer: BLUE CROSS/BLUE SHIELD | Source: Ambulatory Visit | Attending: Thoracic Surgery (Cardiothoracic Vascular Surgery) | Admitting: Thoracic Surgery (Cardiothoracic Vascular Surgery)

## 2014-11-22 ENCOUNTER — Ambulatory Visit (INDEPENDENT_AMBULATORY_CARE_PROVIDER_SITE_OTHER): Payer: Self-pay | Admitting: Physician Assistant

## 2014-11-22 ENCOUNTER — Other Ambulatory Visit: Payer: Self-pay | Admitting: Thoracic Surgery (Cardiothoracic Vascular Surgery)

## 2014-11-22 ENCOUNTER — Inpatient Hospital Stay (HOSPITAL_COMMUNITY): Payer: BLUE CROSS/BLUE SHIELD

## 2014-11-22 VITALS — BP 132/87 | HR 96 | Temp 97.8°F | Resp 16 | Ht 63.0 in | Wt 201.0 lb

## 2014-11-22 DIAGNOSIS — F419 Anxiety disorder, unspecified: Secondary | ICD-10-CM | POA: Diagnosis present

## 2014-11-22 DIAGNOSIS — Z955 Presence of coronary angioplasty implant and graft: Secondary | ICD-10-CM

## 2014-11-22 DIAGNOSIS — E1040 Type 1 diabetes mellitus with diabetic neuropathy, unspecified: Secondary | ICD-10-CM | POA: Diagnosis present

## 2014-11-22 DIAGNOSIS — T814XXA Infection following a procedure, initial encounter: Secondary | ICD-10-CM | POA: Diagnosis present

## 2014-11-22 DIAGNOSIS — G8918 Other acute postprocedural pain: Secondary | ICD-10-CM

## 2014-11-22 DIAGNOSIS — E669 Obesity, unspecified: Secondary | ICD-10-CM | POA: Diagnosis present

## 2014-11-22 DIAGNOSIS — L089 Local infection of the skin and subcutaneous tissue, unspecified: Secondary | ICD-10-CM | POA: Diagnosis present

## 2014-11-22 DIAGNOSIS — B9562 Methicillin resistant Staphylococcus aureus infection as the cause of diseases classified elsewhere: Secondary | ICD-10-CM | POA: Diagnosis present

## 2014-11-22 DIAGNOSIS — I2510 Atherosclerotic heart disease of native coronary artery without angina pectoris: Secondary | ICD-10-CM | POA: Diagnosis present

## 2014-11-22 DIAGNOSIS — Z9049 Acquired absence of other specified parts of digestive tract: Secondary | ICD-10-CM | POA: Diagnosis present

## 2014-11-22 DIAGNOSIS — Z888 Allergy status to other drugs, medicaments and biological substances status: Secondary | ICD-10-CM | POA: Diagnosis not present

## 2014-11-22 DIAGNOSIS — F329 Major depressive disorder, single episode, unspecified: Secondary | ICD-10-CM | POA: Diagnosis present

## 2014-11-22 DIAGNOSIS — Z90710 Acquired absence of both cervix and uterus: Secondary | ICD-10-CM | POA: Diagnosis not present

## 2014-11-22 DIAGNOSIS — Z951 Presence of aortocoronary bypass graft: Secondary | ICD-10-CM

## 2014-11-22 DIAGNOSIS — Y838 Other surgical procedures as the cause of abnormal reaction of the patient, or of later complication, without mention of misadventure at the time of the procedure: Secondary | ICD-10-CM | POA: Diagnosis present

## 2014-11-22 DIAGNOSIS — K219 Gastro-esophageal reflux disease without esophagitis: Secondary | ICD-10-CM | POA: Diagnosis present

## 2014-11-22 DIAGNOSIS — J449 Chronic obstructive pulmonary disease, unspecified: Secondary | ICD-10-CM | POA: Diagnosis present

## 2014-11-22 DIAGNOSIS — I252 Old myocardial infarction: Secondary | ICD-10-CM | POA: Diagnosis not present

## 2014-11-22 DIAGNOSIS — L03313 Cellulitis of chest wall: Secondary | ICD-10-CM | POA: Diagnosis not present

## 2014-11-22 DIAGNOSIS — R911 Solitary pulmonary nodule: Secondary | ICD-10-CM

## 2014-11-22 DIAGNOSIS — E039 Hypothyroidism, unspecified: Secondary | ICD-10-CM | POA: Diagnosis present

## 2014-11-22 DIAGNOSIS — Z7982 Long term (current) use of aspirin: Secondary | ICD-10-CM

## 2014-11-22 DIAGNOSIS — Z6836 Body mass index (BMI) 36.0-36.9, adult: Secondary | ICD-10-CM | POA: Diagnosis not present

## 2014-11-22 DIAGNOSIS — M1990 Unspecified osteoarthritis, unspecified site: Secondary | ICD-10-CM | POA: Diagnosis present

## 2014-11-22 DIAGNOSIS — Z79899 Other long term (current) drug therapy: Secondary | ICD-10-CM

## 2014-11-22 DIAGNOSIS — F17210 Nicotine dependence, cigarettes, uncomplicated: Secondary | ICD-10-CM | POA: Diagnosis present

## 2014-11-22 DIAGNOSIS — T148XXA Other injury of unspecified body region, initial encounter: Secondary | ICD-10-CM

## 2014-11-22 DIAGNOSIS — E785 Hyperlipidemia, unspecified: Secondary | ICD-10-CM | POA: Diagnosis present

## 2014-11-22 DIAGNOSIS — I25119 Atherosclerotic heart disease of native coronary artery with unspecified angina pectoris: Secondary | ICD-10-CM

## 2014-11-22 HISTORY — DX: Personal history of other diseases of the digestive system: Z87.19

## 2014-11-22 HISTORY — DX: Solitary pulmonary nodule: R91.1

## 2014-11-22 HISTORY — DX: Low back pain: M54.5

## 2014-11-22 HISTORY — DX: Long term (current) use of insulin: Z79.4

## 2014-11-22 HISTORY — DX: Personal history of peptic ulcer disease: Z87.11

## 2014-11-22 HISTORY — DX: Reserved for inherently not codable concepts without codable children: IMO0001

## 2014-11-22 HISTORY — DX: Type 2 diabetes mellitus without complications: E11.9

## 2014-11-22 HISTORY — DX: Unspecified asthma, uncomplicated: J45.909

## 2014-11-22 HISTORY — DX: Personal history of other medical treatment: Z92.89

## 2014-11-22 HISTORY — DX: Low back pain, unspecified: M54.50

## 2014-11-22 HISTORY — DX: Other chronic pain: G89.29

## 2014-11-22 LAB — CBC
HCT: 44 % (ref 36.0–46.0)
HEMOGLOBIN: 15.1 g/dL — AB (ref 12.0–15.0)
MCH: 30.1 pg (ref 26.0–34.0)
MCHC: 34.3 g/dL (ref 30.0–36.0)
MCV: 87.8 fL (ref 78.0–100.0)
Platelets: 226 10*3/uL (ref 150–400)
RBC: 5.01 MIL/uL (ref 3.87–5.11)
RDW: 12.5 % (ref 11.5–15.5)
WBC: 6.9 10*3/uL (ref 4.0–10.5)

## 2014-11-22 LAB — GLUCOSE, CAPILLARY
Glucose-Capillary: 337 mg/dL — ABNORMAL HIGH (ref 70–99)
Glucose-Capillary: 196 mg/dL — ABNORMAL HIGH (ref 70–99)

## 2014-11-22 LAB — BASIC METABOLIC PANEL
ANION GAP: 12 (ref 5–15)
BUN: 21 mg/dL (ref 6–23)
CHLORIDE: 98 mmol/L (ref 96–112)
CO2: 23 mmol/L (ref 19–32)
Calcium: 9.8 mg/dL (ref 8.4–10.5)
Creatinine, Ser: 0.9 mg/dL (ref 0.50–1.10)
GFR calc non Af Amer: 72 mL/min — ABNORMAL LOW (ref >90)
GFR, EST AFRICAN AMERICAN: 83 mL/min — AB (ref >90)
GLUCOSE: 302 mg/dL — AB (ref 70–99)
POTASSIUM: 3.8 mmol/L (ref 3.5–5.1)
SODIUM: 133 mmol/L — AB (ref 135–145)

## 2014-11-22 LAB — MRSA PCR SCREENING: MRSA by PCR: POSITIVE — AB

## 2014-11-22 MED ORDER — MOMETASONE FURO-FORMOTEROL FUM 100-5 MCG/ACT IN AERO
2.0000 | INHALATION_SPRAY | Freq: Two times a day (BID) | RESPIRATORY_TRACT | Status: DC
  Administered 2014-11-22 – 2014-11-26 (×7): 2 via RESPIRATORY_TRACT
  Filled 2014-11-22: qty 8.8

## 2014-11-22 MED ORDER — ALBUTEROL SULFATE (2.5 MG/3ML) 0.083% IN NEBU
3.0000 mL | INHALATION_SOLUTION | Freq: Four times a day (QID) | RESPIRATORY_TRACT | Status: DC | PRN
Start: 2014-11-22 — End: 2014-11-26

## 2014-11-22 MED ORDER — OXYCODONE HCL 5 MG PO TABS
5.0000 mg | ORAL_TABLET | ORAL | Status: DC | PRN
  Administered 2014-11-22 – 2014-11-23 (×3): 10 mg via ORAL
  Administered 2014-11-23: 5 mg via ORAL
  Administered 2014-11-24 (×2): 10 mg via ORAL
  Filled 2014-11-22: qty 2
  Filled 2014-11-22: qty 1
  Filled 2014-11-22 (×4): qty 2

## 2014-11-22 MED ORDER — INSULIN ASPART 100 UNIT/ML ~~LOC~~ SOLN
0.0000 [IU] | Freq: Three times a day (TID) | SUBCUTANEOUS | Status: DC
  Administered 2014-11-22: 11 [IU] via SUBCUTANEOUS
  Administered 2014-11-23: 2 [IU] via SUBCUTANEOUS
  Administered 2014-11-23: 5 [IU] via SUBCUTANEOUS
  Administered 2014-11-24 – 2014-11-26 (×4): 3 [IU] via SUBCUTANEOUS

## 2014-11-22 MED ORDER — PIPERACILLIN-TAZOBACTAM 3.375 G IVPB
3.3750 g | Freq: Three times a day (TID) | INTRAVENOUS | Status: DC
  Administered 2014-11-22 – 2014-11-24 (×6): 3.375 g via INTRAVENOUS
  Filled 2014-11-22 (×9): qty 50

## 2014-11-22 MED ORDER — INSULIN GLARGINE 100 UNIT/ML ~~LOC~~ SOLN
58.0000 [IU] | Freq: Two times a day (BID) | SUBCUTANEOUS | Status: DC
  Administered 2014-11-22 – 2014-11-23 (×2): 58 [IU] via SUBCUTANEOUS
  Filled 2014-11-22 (×5): qty 0.58

## 2014-11-22 MED ORDER — ASPIRIN EC 81 MG PO TBEC
81.0000 mg | DELAYED_RELEASE_TABLET | Freq: Every day | ORAL | Status: DC
  Administered 2014-11-23: 81 mg via ORAL
  Filled 2014-11-22: qty 1

## 2014-11-22 MED ORDER — ACETAMINOPHEN 325 MG PO TABS: 650.0000 mg | ORAL_TABLET | Freq: Four times a day (QID) | ORAL | Status: DC | PRN

## 2014-11-22 MED ORDER — TIOTROPIUM BROMIDE MONOHYDRATE 18 MCG IN CAPS
18.0000 ug | ORAL_CAPSULE | Freq: Every day | RESPIRATORY_TRACT | Status: DC
  Administered 2014-11-22: 18 ug via RESPIRATORY_TRACT
  Filled 2014-11-22: qty 5

## 2014-11-22 MED ORDER — ONDANSETRON HCL 4 MG/2ML IJ SOLN
4.0000 mg | Freq: Three times a day (TID) | INTRAMUSCULAR | Status: DC | PRN
  Administered 2014-11-22 – 2014-11-23 (×4): 4 mg via INTRAVENOUS
  Filled 2014-11-22 (×4): qty 2

## 2014-11-22 MED ORDER — INSULIN ASPART 100 UNIT/ML ~~LOC~~ SOLN
0.0000 [IU] | Freq: Three times a day (TID) | SUBCUTANEOUS | Status: DC
Start: 2014-11-23 — End: 2014-11-22

## 2014-11-22 MED ORDER — ENSURE ENLIVE PO LIQD: 237.0000 mL | Freq: Two times a day (BID) | ORAL | Status: DC

## 2014-11-22 MED ORDER — FENOFIBRATE 145 MG PO TABS
145.0000 mg | ORAL_TABLET | Freq: Every day | ORAL | Status: DC
  Filled 2014-11-22 (×2): qty 1

## 2014-11-22 MED ORDER — LISINOPRIL 2.5 MG PO TABS
2.5000 mg | ORAL_TABLET | Freq: Every day | ORAL | Status: DC
  Administered 2014-11-23: 2.5 mg via ORAL
  Filled 2014-11-22: qty 1

## 2014-11-22 MED ORDER — CARVEDILOL 6.25 MG PO TABS
6.2500 mg | ORAL_TABLET | Freq: Two times a day (BID) | ORAL | Status: DC
  Administered 2014-11-22 – 2014-11-23 (×2): 6.25 mg via ORAL
  Filled 2014-11-22 (×5): qty 1

## 2014-11-22 MED ORDER — VANCOMYCIN HCL 10 G IV SOLR
1250.0000 mg | Freq: Two times a day (BID) | INTRAVENOUS | Status: DC
  Administered 2014-11-22 – 2014-11-25 (×5): 1250 mg via INTRAVENOUS
  Filled 2014-11-22 (×7): qty 1250

## 2014-11-22 NOTE — Progress Notes (Addendum)
ANTIBIOTIC CONSULT NOTE - INITIAL  Pharmacy Consult for vancomycin, zosyn Indication: sternal wound infection, possible osteo, cellulitis  Allergies  Allergen Reactions  . Wellbutrin [Bupropion] Other (See Comments)    SEVERE CHEST PAIN    Patient Measurements: Height: 5\' 3"  (160 cm) Weight: 204 lb 9.6 oz (92.806 kg) IBW/kg (Calculated) : 52.4 Adjusted Body Weight: n/a  Vital Signs: Temp: 98.2 F (36.8 C) (03/21 1649) Temp Source: Oral (03/21 1649) BP: 115/65 mmHg (03/21 1649) Pulse Rate: 89 (03/21 1649) Intake/Output from previous day:   Intake/Output from this shift:    Labs:  Recent Labs  11/22/14 1834  WBC 6.9  HGB 15.1*  PLT 226  CREATININE 0.90   Estimated Creatinine Clearance: 78.3 mL/min (by C-G formula based on Cr of 0.9). No results for input(s): VANCOTROUGH, VANCOPEAK, VANCORANDOM, GENTTROUGH, GENTPEAK, GENTRANDOM, TOBRATROUGH, TOBRAPEAK, TOBRARND, AMIKACINPEAK, AMIKACINTROU, AMIKACIN in the last 72 hours.   Microbiology: No results found for this or any previous visit (from the past 720 hour(s)).  Medical History: Past Medical History  Diagnosis Date  . Arthritis   . Back pain, chronic   . Chronic shoulder pain   . COPD (chronic obstructive pulmonary disease)   . 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 CAD, multiple vessel   . Hyperlipidemia   . GERD (gastroesophageal reflux disease)   . Chest tightness   . Angina pectoris   . Myocardial infarction     PCI w/ drug eluting stent LCx  . Diabetes mellitus with complication   . Wears glasses   . Hypothyroidism     PMH:  . Anxiety   . Depression   . History of hiatal hernia   . Headache   . Neuropathy due to secondary diabetes   . 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 [START ON 11/23/2014] aspirin EC  81 mg Oral Daily  . carvedilol  6.25 mg Oral BID WC  . [START ON 11/23/2014] fenofibrate  145 mg  Oral Daily  . insulin aspart  0-15 Units Subcutaneous TID WC  . insulin glargine  58 Units Subcutaneous BID  . [START ON 11/23/2014] lisinopril  2.5 mg Oral Daily  . mometasone-formoterol  2 puff Inhalation BID  . piperacillin-tazobactam (ZOSYN)  IV  3.375 g Intravenous 3 times per day  . tiotropium  18 mcg Inhalation Daily  . vancomycin  1,250 mg Intravenous Q12H   Assessment: 54 yo female s/p CABG x 2 on 09/22/14 admitted with suspected sternal wound infection/cellulitis.  Also with suspected osteomyelitis.  Last Scr 09/2014 was 0.74, estimated CrCl ~ 70 ml/min.    Goal of Therapy:  Vancomycin trough level 15-20 mcg/ml  Plan:  1. Zosyn 3.375g IV q 8 hrs (extended-interval infusion) 2. Vancomycin 1250 mg IV q 12 hrs. 3. F/u cultures, renal function and clinical course.  Tad Moore, BCPS  Clinical Pharmacist Pager [phone]  11/22/2014 7:17 PM

## 2014-11-22 NOTE — Progress Notes (Signed)
HPI:  Sara Hobbs is a 54 yo obese white female who is well known to [facility].  She is S/P CABG x 2 performed 09/22/2014.  Her hospital course was uncomplicated.  She has done well post operatively and has been seen for routine visits in our office.  However, she contacted the office today for complaints of sternal drainage.  The patient states that last week on Thursday she was sick and vomiting.  She states that she also noticed some greenish yellow drainage from her sternal incision.  She states she had her husband look at it and the wound was in fact open.  She went to a local urgent care center who prescribed her Keflex, but for whatever reason did not get a wound culture.  Currently the patient states that she has some mild chest discomfort.  She denies sternal clicking.  She does state she had a few fevers last week which were around 100.  Her husband states her incision was red but this has improved since the antibiotics started at the urgent care.  Her blood sugars remain uncontrolled and she continues to smoke.    Current Outpatient Prescriptions  Medication Sig Dispense Refill  . albuterol (PROVENTIL HFA;VENTOLIN HFA) 108 (90 BASE) MCG/ACT inhaler Inhale 2 puffs into the lungs every 6 (six) hours as needed for wheezing or shortness of breath.    Marland Kitchen aspirin EC 81 MG tablet Take 81 mg by mouth daily.    . canagliflozin (INVOKANA) 300 MG TABS tablet Take 300 mg by mouth daily before breakfast.    . carvedilol (COREG) 6.25 MG tablet Take 6.25 mg by mouth 2 (two) times daily with a meal.    . cephALEXin (KEFLEX) 500 MG capsule Take 500 mg by mouth 2 (two) times daily. X 10 DAYS    . fenofibrate (TRICOR) 145 MG tablet Take 145 mg by mouth daily.    . Fluticasone-Salmeterol (ADVAIR) 100-50 MCG/DOSE AEPB Inhale 1 puff into the lungs 2 (two) times daily.    Marland Kitchen glucose blood test strip 1 each by Other route 3 (three) times daily. Use as instructed    . insulin glargine (LANTUS) 100 UNIT/ML injection Inject  58 Units into the skin 2 (two) times daily.     . Insulin Pen Needle 31G X 8 MM MISC by Does not apply route. USE AS DIRECTED    . lisinopril (PRINIVIL,ZESTRIL) 2.5 MG tablet Take 1 tablet (2.5 mg total) by mouth daily. 30 tablet 3  . Oxycodone HCl 10 MG TABS Take 1 tablet (10 mg total) by mouth every 8 (eight) hours as needed (pain). 40 tablet 0  . promethazine (PHENERGAN) 25 MG tablet Take 25 mg by mouth every 6 (six) hours as needed for nausea or vomiting.    . ticagrelor (BRILINTA) 90 MG TABS tablet Take by mouth 2 (two) times daily.    Marland Kitchen tiotropium (SPIRIVA) 18 MCG inhalation capsule Place 18 mcg into inhaler and inhale daily.     No current facility-administered medications for this visit.    Physical Exam:  BP 132/87 mmHg  Pulse 96  Temp(Src) 97.8 F (36.6 C) (Oral)  Resp 16  Ht  (1.6 m)  Wt 201 lb (91.173 kg)  BMI 35.61 kg/m2  SpO2 98%  Gen: no apparent distress Heart: RRR Lungs: CTA bilaterally Abd; soft non-tender, non-distended Skin: 2-3 cm opening in the mid portion of her sternotomy, there is tracking several cm up into the inferior portion of her sternotomy  A/P:  1. S/P CABG x 2- will hold Brilinta for now, continue Lisinopril, Coreg, Tricor, ASA 2. Suspected Sternal Cellulitis- on Keflex from urgent care center, will switch to IV Vanc and Zosyn, wound culture obtained in clinic, will get CT Chest to rule out sternal infection/dehiscence 3. DM- uncontrolled, will order insulin, SSIP- if sugars very high may need insulin drip 4. COPD- continue spiriva, Proventil prn, Advair 5. Dispo- admit to hospital for further care  Lowella Dandy, PA-C [facility] [phone]

## 2014-11-22 NOTE — Progress Notes (Signed)
Patient to CT via bed for testing

## 2014-11-22 NOTE — H&P (Signed)
[address]       [name] [address]             [phone]          CARDIOTHORACIC SURGERY HISTORY AND PHYSICAL EXAM  PCP is Lavell Islam, MD Referring Provider is Diamond Nickel., MD  Chief Complaint:  Sternal wound drainage  HPI:  Sara Hobbs is a 54 yo obese white female who is well known to [facility].  She is S/P CABG x 2 performed 09/22/2014 for severe 3-vessel CAD with unstable angina.  Her hospital course was uncomplicated.  She has done well post operatively and has been seen for routine visits in our office.  However, she contacted the office today for complaints of sternal drainage.  The patient states that last week on Thursday she was sick and vomiting.  She states that she also noticed some greenish yellow drainage from her sternal incision.  She states she had her husband look at it and the wound was in fact open.  She went to a local urgent care center who prescribed her Keflex, but for whatever reason did not get a wound culture.  Currently the patient states that she has some mild chest discomfort.  She denies sternal clicking.  She does state she had a few fevers last week which were around 100. Her husband states her incision was red but this has improved since the antibiotics started at the urgent care.  Her blood sugars remain uncontrolled and she continues to smoke.     Past Medical History  Diagnosis Date  . Arthritis   . Back pain, chronic   . Chronic shoulder pain   . COPD (chronic obstructive pulmonary disease)   . 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 CAD, multiple vessel   . Hyperlipidemia   . GERD (gastroesophageal reflux disease)   . Chest tightness   . Angina pectoris   . Myocardial infarction     PCI w/ drug eluting stent LCx  . Diabetes mellitus with complication   . Wears glasses   . Hypothyroidism     PMH:  . Anxiety   . Depression   . History of hiatal hernia   . Headache   . Neuropathy due  to secondary diabetes   . 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 Cesarean section    . Cholecystectomy    . Abdominal hysterectomy    . Gastric resection      bezoar  . Percutaneous coronary stent intervention (pci-s)  11/11/2013    Drug-eluting stent in LCx  . Cardiac catheterization      09/01/14  . Tubal ligation    . Breast surgery      left breast duct surgery  . Colonoscopy    . Esophagogastroduodenoscopy    . Coronary artery bypass graft N/A 09/22/2014    Procedure: CORONARY ARTERY BYPASS GRAFTING (CABG);  Surgeon: Purcell Nails, MD;  Location: [facility] OR;  Service: Open Heart Surgery;  Laterality: N/A;  Times 2 using left internal mammary artery and endoscopically harvested right saphenous vein  . Tee without cardioversion N/A 09/22/2014    Procedure: TRANSESOPHAGEAL ECHOCARDIOGRAM (TEE);  Surgeon: Purcell Nails, MD;  Location: [facility] OR;  Service: Open Heart Surgery;  Laterality: N/A;    Family History  Problem  Relation Age of Onset  . Adopted: Yes  . Family history unknown: Yes    Social History History  Substance Use Topics  . Smoking status: Current Every Day Smoker -- 2.00 packs/day for 42 years    Types: Cigarettes  . Smokeless tobacco: Former Neurosurgeon    Types: Chew    Quit date: 09/13/2005  . Alcohol Use: No    Prior to Admission medications   Medication Sig Start Date End Date Taking? Authorizing Provider  albuterol (PROVENTIL HFA;VENTOLIN HFA) 108 (90 BASE) MCG/ACT inhaler Inhale 2 puffs into the lungs every 6 (six) hours as needed for wheezing or shortness of breath.   Yes Historical Provider, MD  aspirin EC 81 MG tablet Take 81 mg by mouth daily.   Yes Historical Provider, MD  canagliflozin (INVOKANA) 300 MG TABS tablet Take 300 mg by mouth daily before breakfast.   Yes Historical Provider, MD  carvedilol (COREG) 6.25 MG tablet Take 6.25 mg by mouth 2 (two) times daily with  a meal.   Yes Historical Provider, MD  cephALEXin (KEFLEX) 500 MG capsule Take 500 mg by mouth 2 (two) times daily. X 10 DAYS. Started medication on 11-20-14 11/20/14 12/02/14 Yes Historical Provider, MD  fenofibrate (TRICOR) 145 MG tablet Take 145 mg by mouth daily.   Yes Historical Provider, MD  Fluticasone-Salmeterol (ADVAIR) 100-50 MCG/DOSE AEPB Inhale 1 puff into the lungs 2 (two) times daily.   Yes Historical Provider, MD  glucose blood test strip 1 each by Other route 3 (three) times daily. Use as instructed   Yes Historical Provider, MD  insulin glargine (LANTUS) 100 UNIT/ML injection Inject 58 Units into the skin 2 (two) times daily.    Yes Historical Provider, MD  lisinopril (PRINIVIL,ZESTRIL) 2.5 MG tablet Take 1 tablet (2.5 mg total) by mouth daily. 09/27/14  Yes Erin R Barrett, PA-C  Oxycodone HCl 10 MG TABS Take 1 tablet (10 mg total) by mouth every 8 (eight) hours as needed (pain). 10/25/14  Yes Purcell Nails, MD  promethazine (PHENERGAN) 25 MG tablet Take 25 mg by mouth every 6 (six) hours as needed for nausea or vomiting.   Yes Historical Provider, MD  ticagrelor (BRILINTA) 90 MG TABS tablet Take by mouth 2 (two) times daily.   Yes Historical Provider, MD  tiotropium (SPIRIVA) 18 MCG inhalation capsule Place 18 mcg into inhaler and inhale daily.   Yes Historical Provider, MD  Insulin Pen Needle 31G X 8 MM MISC by Does not apply route. USE AS DIRECTED    Historical Provider, MD    Allergies  Allergen Reactions  . Wellbutrin [Bupropion] Other (See Comments)    SEVERE CHEST PAIN    Review of Systems:  General:  normal appetite, normal energy   Respiratory:  no cough, no wheezing, no hemoptysis, no pain with inspiration or cough, + shortness of breath but only with exertion, mild residual soreness in chest, exacerbated by cough and movement  Cardiac:   no exertional chest pain or tightness, no exertional SOB, no resting SOB, no PND, no orthopnea, no LE edema, no palpitations, no  syncope  GI:   no difficulty swallowing, no hematochezia, no hematemesis, no melena, no constipation, no diarrhea   GU:   no dysuria, no urgency, no frequency   Musculoskeletal: no arthritis, no arthralgia   Vascular:  no pain suggestive of claudication   Neuro:   no symptoms suggestive of TIA's, no seizures, no headaches, no peripheral neuropathy   Endocrine:  Negative  HEENT:  no loose teeth or painful teeth,  no recent vision changes  Psych:   no anxiety, no depression    Physical Exam:   BP 115/65 mmHg  Pulse 89  Temp(Src) 98.2 F (36.8 C) (Oral)  Resp 18  Ht 5\' 3"  (1.6 m)  Wt 92.806 kg (204 lb 9.6 oz)  BMI 36.25 kg/m2  SpO2 97%  General:  No apparent distress  HEENT:  Unremarkable   Neck:   no JVD, no bruits, no adenopathy   Chest:   clear to auscultation, symmetrical breath sounds, no wheezes, no rhonchi, sternum stable and non-tender  CV:   RRR, no murmur   Abdomen:  soft, non-tender, no masses   Extremities:  warm, well-perfused, pulses not palpable  Rectal/GU  Deferred  Neuro:   Grossly non-focal and symmetrical throughout  Skin:   2-3 cm opening in the mid portion of her sternotomy, there is tracking several cm up into the inferior portion of her sternotomy  Diagnostic Tests:  n/a  Impression/Plan:  1. S/P CABG x 2- will hold Brilinta for now, continue Lisinopril, Coreg, Tricor, ASA 2. Suspected Sternal Cellulitis- on Keflex from urgent care center, will switch to IV Vanc and Zosyn, wound culture obtained in clinic, will get CT Chest to rule out sternal infection/dehiscence 3. DM- uncontrolled, will order insulin, SSIP- if sugars very high may need insulin drip 4. COPD- continue spiriva, Proventil prn, Advair 5. Dispo- admit to hospital for further care  Lowella Dandy, PA-C [facility] [phone]           I have seen and examined the patient and agree with the assessment and plan as outlined.  Patient with new onset  bloody drainage from inferior aspect of sternotomy incision approximately 2 months status post coronary artery bypass grafting 2.  By report the sternal wound drainage developed acutely following an episode of prolonged nausea and vomiting several days ago.  There is no sternal instability on exam nor other physical findings to suggest sternal wound dehiscence or deep sternal wound infection. At present there is no significant erythema or surrounding findings to suggest superficial cellulitis, although by report the patient and her husband state that there was some mild redness initially. Will obtain a wound culture in the office (sent) and blood cultures once the patient is admitted to the hospital.  Will check noncontrast CT scan of the chest to look for signs of sternal dehiscence or deep sternal wound infection. Will begin empiric antibiotics and local wound care. The patient will need close attention to diabetes management. Phase 1 cardiac rehabilitation should be resumed.  Purcell Nails 11/22/2014 5:40 PM

## 2014-11-22 NOTE — Progress Notes (Signed)
Received results of pts MRSA swab-pt is positive for MRSA by PCR swab per lab.  Will continue Contact Precautions for pt and maintain isolation status in room.  Pt educated at bedside of need for isolation and verbalizes understanding

## 2014-11-23 ENCOUNTER — Encounter (HOSPITAL_COMMUNITY)
Admission: AD | Disposition: A | Discharge status: Home Health | Payer: Self-pay | Source: Ambulatory Visit | Attending: Thoracic Surgery (Cardiothoracic Vascular Surgery)

## 2014-11-23 ENCOUNTER — Inpatient Hospital Stay (HOSPITAL_COMMUNITY): Payer: BLUE CROSS/BLUE SHIELD | Admitting: Certified Registered"

## 2014-11-23 DIAGNOSIS — T148XXA Other injury of unspecified body region, initial encounter: Secondary | ICD-10-CM

## 2014-11-23 DIAGNOSIS — L03313 Cellulitis of chest wall: Secondary | ICD-10-CM

## 2014-11-23 DIAGNOSIS — L089 Local infection of the skin and subcutaneous tissue, unspecified: Secondary | ICD-10-CM | POA: Diagnosis present

## 2014-11-23 HISTORY — PX: APPLICATION OF WOUND VAC: SHX5189

## 2014-11-23 HISTORY — PX: STERNAL WOUND DEBRIDEMENT: SHX1058

## 2014-11-23 LAB — GLUCOSE, CAPILLARY
GLUCOSE-CAPILLARY: 139 mg/dL — AB (ref 70–99)
Glucose-Capillary: 147 mg/dL — ABNORMAL HIGH (ref 70–99)
Glucose-Capillary: 219 mg/dL — ABNORMAL HIGH (ref 70–99)
Glucose-Capillary: 107 mg/dL — ABNORMAL HIGH (ref 70–99)
Glucose-Capillary: 74 mg/dL (ref 70–99)

## 2014-11-23 LAB — CBC
HCT: 43 % (ref 36.0–46.0)
HEMOGLOBIN: 14.5 g/dL (ref 12.0–15.0)
MCH: 29.7 pg (ref 26.0–34.0)
MCHC: 33.7 g/dL (ref 30.0–36.0)
MCV: 87.9 fL (ref 78.0–100.0)
Platelets: 229 10*3/uL (ref 150–400)
RBC: 4.89 MIL/uL (ref 3.87–5.11)
RDW: 12.5 % (ref 11.5–15.5)
WBC: 6.6 10*3/uL (ref 4.0–10.5)

## 2014-11-23 LAB — PROTIME-INR
INR: 0.96 (ref 0.00–1.49)
PROTHROMBIN TIME: 12.9 s (ref 11.6–15.2)

## 2014-11-23 LAB — APTT: APTT: 26 s (ref 24–37)

## 2014-11-23 SURGERY — DEBRIDEMENT, WOUND, STERNUM
Anesthesia: Monitor Anesthesia Care

## 2014-11-23 MED ORDER — LACTATED RINGERS IV SOLN
INTRAVENOUS | Status: DC | PRN
  Administered 2014-11-23: 16:00:00 via INTRAVENOUS

## 2014-11-23 MED ORDER — PHENYLEPHRINE HCL 10 MG/ML IJ SOLN
INTRAMUSCULAR | Status: DC | PRN
  Administered 2014-11-23 (×3): 80 ug via INTRAVENOUS

## 2014-11-23 MED ORDER — MIDAZOLAM HCL 5 MG/5ML IJ SOLN
INTRAMUSCULAR | Status: DC | PRN
  Administered 2014-11-23: 2 mg via INTRAVENOUS

## 2014-11-23 MED ORDER — CANAGLIFLOZIN 300 MG PO TABS
300.0000 mg | ORAL_TABLET | Freq: Every day | ORAL | Status: DC
  Administered 2014-11-24 – 2014-11-26 (×3): 300 mg via ORAL
  Filled 2014-11-23 (×4): qty 300

## 2014-11-23 MED ORDER — FENTANYL CITRATE 0.05 MG/ML IJ SOLN
INTRAMUSCULAR | Status: AC
  Filled 2014-11-23: qty 5

## 2014-11-23 MED ORDER — ASPIRIN EC 81 MG PO TBEC
81.0000 mg | DELAYED_RELEASE_TABLET | Freq: Every day | ORAL | Status: DC
  Administered 2014-11-24 – 2014-11-26 (×3): 81 mg via ORAL
  Filled 2014-11-23 (×3): qty 1

## 2014-11-23 MED ORDER — MOMETASONE FURO-FORMOTEROL FUM 100-5 MCG/ACT IN AERO: 2.0000 | INHALATION_SPRAY | Freq: Two times a day (BID) | RESPIRATORY_TRACT | Status: DC

## 2014-11-23 MED ORDER — MUPIROCIN 2 % EX OINT
1.0000 "application " | TOPICAL_OINTMENT | Freq: Two times a day (BID) | CUTANEOUS | Status: DC
  Administered 2014-11-23 – 2014-11-26 (×7): 1 via NASAL
  Filled 2014-11-23 (×3): qty 22

## 2014-11-23 MED ORDER — LISINOPRIL 2.5 MG PO TABS
2.5000 mg | ORAL_TABLET | Freq: Every day | ORAL | Status: DC
  Filled 2014-11-23: qty 1

## 2014-11-23 MED ORDER — MORPHINE SULFATE 2 MG/ML IJ SOLN
2.0000 mg | INTRAMUSCULAR | Status: DC | PRN
  Administered 2014-11-24: 2 mg via INTRAVENOUS
  Filled 2014-11-23: qty 1

## 2014-11-23 MED ORDER — TIOTROPIUM BROMIDE MONOHYDRATE 18 MCG IN CAPS
18.0000 ug | ORAL_CAPSULE | Freq: Every day | RESPIRATORY_TRACT | Status: DC
  Administered 2014-11-24 – 2014-11-26 (×3): 18 ug via RESPIRATORY_TRACT
  Filled 2014-11-23: qty 5

## 2014-11-23 MED ORDER — CHLORHEXIDINE GLUCONATE CLOTH 2 % EX PADS
6.0000 | MEDICATED_PAD | Freq: Every day | CUTANEOUS | Status: DC
  Administered 2014-11-25 – 2014-11-26 (×2): 6 via TOPICAL

## 2014-11-23 MED ORDER — FENTANYL CITRATE 0.05 MG/ML IJ SOLN
INTRAMUSCULAR | Status: DC | PRN
  Administered 2014-11-23 (×3): 25 ug via INTRAVENOUS
  Administered 2014-11-23: 50 ug via INTRAVENOUS
  Administered 2014-11-23: 25 ug via INTRAVENOUS

## 2014-11-23 MED ORDER — FENTANYL CITRATE 0.05 MG/ML IJ SOLN
INTRAMUSCULAR | Status: AC
  Filled 2014-11-23: qty 2

## 2014-11-23 MED ORDER — OXYCODONE HCL 5 MG PO TABS
15.0000 mg | ORAL_TABLET | ORAL | Status: DC | PRN
  Administered 2014-11-23 – 2014-11-26 (×9): 15 mg via ORAL
  Filled 2014-11-23 (×9): qty 3

## 2014-11-23 MED ORDER — MEPERIDINE HCL 25 MG/ML IJ SOLN
6.2500 mg | INTRAMUSCULAR | Status: DC | PRN
Start: 2014-11-23 — End: 2014-11-23

## 2014-11-23 MED ORDER — MIDAZOLAM HCL 2 MG/2ML IJ SOLN: 0.5000 mg | Freq: Once | INTRAMUSCULAR | Status: DC | PRN

## 2014-11-23 MED ORDER — DEXTROSE 50 % IV SOLN
INTRAVENOUS | Status: AC
  Filled 2014-11-23: qty 50

## 2014-11-23 MED ORDER — SODIUM CHLORIDE 0.9 % IR SOLN
Status: DC | PRN
  Administered 2014-11-23: 1000 mL

## 2014-11-23 MED ORDER — MIDAZOLAM HCL 2 MG/2ML IJ SOLN
INTRAMUSCULAR | Status: AC
  Filled 2014-11-23: qty 2

## 2014-11-23 MED ORDER — FENTANYL CITRATE 0.05 MG/ML IJ SOLN
25.0000 ug | INTRAMUSCULAR | Status: DC | PRN
  Administered 2014-11-23: 25 ug via INTRAVENOUS

## 2014-11-23 MED ORDER — GLUCERNA SHAKE PO LIQD
237.0000 mL | Freq: Three times a day (TID) | ORAL | Status: DC
  Administered 2014-11-24 – 2014-11-26 (×7): 237 mL via ORAL

## 2014-11-23 MED ORDER — CARVEDILOL 6.25 MG PO TABS
6.2500 mg | ORAL_TABLET | Freq: Two times a day (BID) | ORAL | Status: DC
  Administered 2014-11-24: 6.25 mg via ORAL
  Filled 2014-11-23 (×3): qty 1

## 2014-11-23 MED ORDER — PROPOFOL 10 MG/ML IV BOLUS
INTRAVENOUS | Status: DC | PRN
  Administered 2014-11-23 (×3): 20 mg via INTRAVENOUS

## 2014-11-23 MED ORDER — FENOFIBRATE 160 MG PO TABS
160.0000 mg | ORAL_TABLET | Freq: Every day | ORAL | Status: DC
  Administered 2014-11-23 – 2014-11-26 (×4): 160 mg via ORAL
  Filled 2014-11-23 (×4): qty 1

## 2014-11-23 MED ORDER — PROMETHAZINE HCL 25 MG/ML IJ SOLN: 6.2500 mg | INTRAMUSCULAR | Status: DC | PRN

## 2014-11-23 MED ORDER — ONDANSETRON HCL 4 MG/2ML IJ SOLN
INTRAMUSCULAR | Status: DC | PRN
  Administered 2014-11-23: 4 mg via INTRAVENOUS

## 2014-11-23 SURGICAL SUPPLY — 33 items
APPLICATOR COTTON TIP 6IN STRL (MISCELLANEOUS) ×2 IMPLANT
BENZOIN TINCTURE PRP APPL 2/3 (GAUZE/BANDAGES/DRESSINGS) IMPLANT
BLADE SURG 11 STRL SS (BLADE) ×2 IMPLANT
BNDG GAUZE ELAST 4 BULKY (GAUZE/BANDAGES/DRESSINGS) IMPLANT
CANISTER WOUND CARE 500ML ATS (WOUND CARE) ×2 IMPLANT
CATH THORACIC 28FR RT ANG (CATHETERS) IMPLANT
CATH THORACIC 36FR (CATHETERS) IMPLANT
CATH THORACIC 36FR RT ANG (CATHETERS) IMPLANT
CLIP TI WIDE RED SMALL 24 (CLIP) IMPLANT
CONT SPEC 4OZ CLIKSEAL STRL BL (MISCELLANEOUS) IMPLANT
DRAPE LAPAROSCOPIC ABDOMINAL (DRAPES) ×2 IMPLANT
DRSG VAC ATS SM SENSATRAC (GAUZE/BANDAGES/DRESSINGS) ×2 IMPLANT
ELECT REM PT RETURN 9FT ADLT (ELECTROSURGICAL) ×2
ELECTRODE REM PT RTRN 9FT ADLT (ELECTROSURGICAL) ×1 IMPLANT
GAUZE SPONGE 4X4 12PLY STRL (GAUZE/BANDAGES/DRESSINGS) ×2 IMPLANT
GAUZE XEROFORM 5X9 LF (GAUZE/BANDAGES/DRESSINGS) IMPLANT
GLOVE BIOGEL PI IND STRL 6.5 (GLOVE) ×1 IMPLANT
GLOVE BIOGEL PI INDICATOR 6.5 (GLOVE) ×1
GOWN STRL REUS W/ TWL LRG LVL3 (GOWN DISPOSABLE) ×4 IMPLANT
GOWN STRL REUS W/TWL LRG LVL3 (GOWN DISPOSABLE) ×4
HANDPIECE INTERPULSE COAX TIP (DISPOSABLE) ×1
KIT BASIN OR (CUSTOM PROCEDURE TRAY) ×2 IMPLANT
KIT ROOM TURNOVER OR (KITS) ×2 IMPLANT
KIT SUCTION CATH 14FR (SUCTIONS) IMPLANT
NS IRRIG 1000ML POUR BTL (IV SOLUTION) ×2 IMPLANT
PACK CHEST (CUSTOM PROCEDURE TRAY) ×2 IMPLANT
PAD ARMBOARD 7.5X6 YLW CONV (MISCELLANEOUS) ×4 IMPLANT
PIN SAFETY STERILE (MISCELLANEOUS) IMPLANT
SET HNDPC FAN SPRY TIP SCT (DISPOSABLE) ×1 IMPLANT
STAPLER VISISTAT 35W (STAPLE) IMPLANT
SWAB COLLECTION DEVICE MRSA (MISCELLANEOUS) IMPLANT
TUBE ANAEROBIC SPECIMEN COL (MISCELLANEOUS) IMPLANT
WATER STERILE IRR 1000ML POUR (IV SOLUTION) ×2 IMPLANT

## 2014-11-23 NOTE — Progress Notes (Signed)
Utilization review completed.  

## 2014-11-23 NOTE — Op Note (Signed)
CARDIOTHORACIC SURGERY OPERATIVE NOTE  Date of Procedure:   11/23/2014  Preoperative Diagnosis:  Superficial sternal wound infection  Postoperative Diagnosis:  same  Procedure:      Sternal wound debridement  Placement of wound VAC device  Surgeon:    Salvatore Decent. Cornelius Moras, MD  Anesthesia:    MAC with intravenous sedation  Operative Findings:   Superficial sternal wound infection with fat necrosis  No evidence for penetration of deep fascia     BRIEF CLINICAL NOTE AND INDICATIONS FOR SURGERY  Patient is a 54 year old female with multiple medical problems including poorly controlled diabetes mellitus and long-standing tobacco abuse who underwent coronary artery bypass grafting 2 on 09/22/2014 for severe three-vessel coronary artery disease with unstable angina. The patient's postoperative recovery was uncomplicated and she was seen most recently in the office on 10/25/2014 at which time she was recovering uneventfully and all of her surgical incisions were healing nicely.  Several days prior to admission the patient developed nausea vomiting that persisted and was associated with forceful vomiting. She then developed some bloody drainage from the midportion of her sternal incision immediately after vomiting severely. She ultimately presented to the office 11/22/2014  with a small area of what appeared to be superficial skin breakdown in the midportion of her sternotomy incision associated with bloody drainage. She was promptly admitted to the hospital. White blood count was not elevated. The patient had no fever. CT scan reveals no sign of deep sternal wound infection or wound dehiscence. After careful examination of the wound operative exploration for wound debridement and possible wound VAC placement was felt the best course of therapy. The patient was advised regarding the indications, risks, potential benefits. All of her questions addressed.     DETAILS OF THE OPERATIVE  PROCEDURE  The patient is brought to the operating room on the above mentioned date and placed in the supine position on the operative table. Intravenous sedation is administered by the anesthesia team. The patient's existing wound dressing is removed. Anterior chest is prepared and draped in a sterile manner.  The patient has a small open area of the skin in the midportion of her sternal incision that is explored with a sterile Q-tip swab.  The wound tracks in a cephalad direction beneath the skin. A scant amount appeared when drainage is noted. Wound swab cultures sent for routine aerobic and anaerobic culture. The skin is divided in the midline to unroof the small cavity in the subcutaneous tissues. Debridement of some necrotic fat is performed. The wound is probed and does not probe deep to the fascia in the midline. Wound is irrigated using pulse lavage irrigation device.   The maximum dimensions of the wound are 4.2 cm in vertical length x 1.0 cm width x 3.0 cm depth.  A small wound VAC is applied.  The patient tolerated the procedure well and was transported directly to the postanesthesia care unit in stable condition. Estimated blood loss was trivial. There were no intraoperative complications.     Salvatore Decent. Cornelius Moras MD 11/23/2014 5:01 PM

## 2014-11-23 NOTE — OR Nursing (Signed)
Ms. Zuleger shortly after arrival her BP was 71/43, pt skin color is ashen.  Dr. Jean Rosenthal at bedside.  CBG is 74. Pt states she is hungry. Pt denies pain and SOB.  She states her blood sugar normally runs about 200.  Dr. Jean Rosenthal ordered to finish current IV bag that is infusing and give patient something to eat.  I offered patient some graham crackers and/or peanut butter and apple juice.  She refused the crackers but accepted the apple juice and drank all of it.  BP improved to 116/54 and her color is better.

## 2014-11-23 NOTE — Anesthesia Preprocedure Evaluation (Addendum)
Anesthesia Evaluation  Patient identified by MRN, date of birth, ID band Patient awake    Reviewed: Allergy & Precautions, NPO status , Patient's Chart, lab work & pertinent test results, reviewed documented beta blocker date and time   History of Anesthesia Complications Negative for: history of anesthetic complications  Airway Mallampati: II  TM Distance: >3 FB Neck ROM: Full    Dental  (+) Dental Advisory Given   Pulmonary COPD COPD inhaler, Current Smoker,  breath sounds clear to auscultation        Cardiovascular hypertension, Pt. on medications and Pt. on home beta blockers - angina+ CAD, + Cardiac Stents and + CABG (1/16 CABG x2) Rhythm:Regular Rate:Normal  LVF normal at cath 12/15   Neuro/Psych  Headaches,    GI/Hepatic Neg liver ROS, GERD-  Poorly Controlled,N/v with this infection   Endo/Other  diabetes (glu 107), Insulin DependentMorbid obesity  Renal/GU negative Renal ROS     Musculoskeletal  (+) Arthritis -,   Abdominal (+) + obese,   Peds  Hematology   Anesthesia Other Findings   Reproductive/Obstetrics                          Anesthesia Physical Anesthesia Plan  ASA: III  Anesthesia Plan: MAC   Post-op Pain Management:    Induction: Intravenous  Airway Management Planned: Simple Face Mask  Additional Equipment:   Intra-op Plan:   Post-operative Plan:   Informed Consent: I have reviewed the patients History and Physical, chart, labs and discussed the procedure including the risks, benefits and alternatives for the proposed anesthesia with the patient or authorized representative who has indicated his/her understanding and acceptance.   Dental advisory given  Plan Discussed with: CRNA and Surgeon  Anesthesia Plan Comments: (Plan routine monitors, MAC)       Anesthesia Quick Evaluation

## 2014-11-23 NOTE — Progress Notes (Addendum)
      [address]       [name] [address]             [phone]     Subjective:  Sara Hobbs complains of pain this morning.  She states the pain medication is not working well enough.  She states that this has been a problem for her and the pain clinic was working on increasing her dose.  Objective: Vital signs in last 24 hours: Temp:  [97.3 F (36.3 C)-98.2 F (36.8 C)] 97.3 F (36.3 C) (03/22 0500) Pulse Rate:  [79-96] 79 (03/22 0500) Cardiac Rhythm:  [-] Normal sinus rhythm (03/21 2158) Resp:  [16-18] 17 (03/22 0500) BP: (98-132)/(58-87) 113/70 mmHg (03/22 0500) SpO2:  [95 %-98 %] 95 % (03/22 0500) Weight:  [201 lb (91.173 kg)-204 lb 9.6 oz (92.806 kg)] 204 lb 9.6 oz (92.806 kg) (03/21 1649)  Intake/Output from previous day: 03/21 0701 - 03/22 0700 In: 540 [P.O.:240; IV Piggyback:300] Out: 2150 [Urine:2150]  General appearance: alert, cooperative and no distress Heart: regular rate and rhythm Lungs: clear to auscultation bilaterally Abdomen: soft, non-tender; bowel sounds normal; no masses,  no organomegaly Wound: sternotomy open 2-3 cm, some yellow pus at opening today, minimal erythema, + odor  Lab Results:  Recent Labs  11/22/14 1834 11/23/14 0545  WBC 6.9 6.6  HGB 15.1* 14.5  HCT 44.0 43.0  PLT 226 229   BMET:  Recent Labs  11/22/14 1834  NA 133*  K 3.8  CL 98  CO2 23  GLUCOSE 302*  BUN 21  CREATININE 0.90  CALCIUM 9.8    PT/INR:  Recent Labs  11/23/14 0545  LABPROT 12.9  INR 0.96   ABG    Component Value Date/Time   PHART 7.403 09/23/2014 0500   HCO3 23.6 09/23/2014 0500   TCO2 23 09/23/2014 1739   ACIDBASEDEF 0.5 09/23/2014 0500   O2SAT 96.2 09/23/2014 0500   CBG (last 3)   Recent Labs  11/22/14 1737 11/22/14 2215 11/23/14 0652  GLUCAP 337* 196* 147*    Assessment/Plan:  1.  Suspected Sternal Cellulitis- CT scan of chest ruled out abscess, did show delayed union of sternum and infection could not be  ruled out 2. ID- continue Vanc, Zosyn for now, no leukocytosis, remains afebrile, Blood and wound cultures pending 3. DM- sugars are erratic, continue carb modified diet, current insulin regimen- chronic issue for patient will adjust as tolerated 4. COPD- continue inhalers 5. Dispo- patient stable, will increase Oxy IR to 15 mg every 4 hours as needed for pain, + purulent drainage this morning dressing changed, continue current care    LOS: 1 day    BARRETT, ERIN 11/23/2014  I have seen and examined the patient and agree with the assessment and plan as outlined.  Scant drainage from sternal incision.  Chest CT w/out any evidence of deep sternal wound infection.  Patient remains afebrile w/ normal WBC.  Mrs Sara Hobbs has what appears to be a very superficial, relatively mild localized sternal wound infection.  Will proceed to OR today for local sternal wound debridement and wound VAC placement.  Plan outlined with patient.  All questions answered.   Purcell Nails 11/23/2014 10:36 AM

## 2014-11-23 NOTE — Progress Notes (Signed)
INITIAL NUTRITION ASSESSMENT  DOCUMENTATION CODES Per approved criteria  -Obesity Unspecified   INTERVENTION: -D/c Ensure Enlive po BID, each supplement provides 350 kcal and 20 grams of protein -Add Glucerna Shake po TID, each supplement provides 220 kcal and 10 grams of protein  NUTRITION DIAGNOSIS: Increased nutrient needs related to wound healing as evidenced by estimated needs.   Goal: Pt will meet >90% of estimated nutritional needs  Monitor:  PO/supplement intake, labs, weight changes, I/O's  Reason for Assessment: MST=2  54 y.o. female  Admitting Dx: <principal problem not specified>  Sara Hobbs is a 54 yo obese white female who is well known to [facility]. She is S/P CABG x 2 performed 09/22/2014 for severe 3-vessel CAD with unstable angina. Her hospital course was uncomplicated. She has done well post operatively and has been seen for routine visits in our office. However, she contacted the office today for complaints of sternal drainage.  ASSESSMENT: Pt very drowsy at time of visit. She endorsees poor appetite and weight loss of 35# in the past month, however, this is not consistent with wt hx, which reveals wt gain. She reveals that she consumed her toast and bacon for breakfast, however, documented meal intake 100%.  Nutrition-focused physical exam revealed no signs of fat or muscle depletion. Pt reports that she has not had the Ensure that was ordered since admitted. Pt would benefit from supplementation due to increased nutrient needs for wound healing. Will substitute with Glucerna due to elevated CBGS.  Pt is planned to surgery either on 11/23/14 or 11/24/14 for debridement and vac placement for infected sternal wound.  Pt would benefit from education once condition is improved due to elevated Hgb A1c.  Labs reviewed. Na: 133, Glucose: 302.   Height: Ht Readings from Last 1 Encounters:  11/22/14 5\' 3"  (1.6 m)    Weight: Wt Readings from Last 1 Encounters:   11/22/14 204 lb 9.6 oz (92.806 kg)    Ideal Body Weight: 115#  % Ideal Body Weight: 177%  Wt Readings from Last 10 Encounters:  11/22/14 204 lb 9.6 oz (92.806 kg)  11/22/14 201 lb (91.173 kg)  10/25/14 196 lb (88.905 kg)  09/27/14 210 lb 1.6 oz (95.3 kg)  09/13/14 208 lb (94.348 kg)    Usual Body Weight: 210#  % Usual Body Weight: 97%  BMI:  Body mass index is 36.25 kg/(m^2). Obesity, class II  Estimated Nutritional Needs: Kcal: 1800-2000 Protein: 75-85 grams Fluid: 1.8-2.0 L  Skin: closed chest incision  Diet Order: Diet Carb Modified Diet NPO time specified  EDUCATION NEEDS: -Education not appropriate at this time   Intake/Output Summary (Last 24 hours) at 11/23/14 1140 Last data filed at 11/23/14 0700  Gross per 24 hour  Intake    540 ml  Output   2150 ml  Net  -1610 ml    Last BM: 11/22/14  Labs:   Recent Labs Lab 11/22/14 1834  NA 133*  K 3.8  CL 98  CO2 23  BUN 21  CREATININE 0.90  CALCIUM 9.8  GLUCOSE 302*    CBG (last 3)   Recent Labs  11/22/14 1737 11/22/14 2215 11/23/14 0652  GLUCAP 337* 196* 147*   Lab Results  Component Value Date   HGBA1C 11.3* 09/20/2014   Scheduled Meds: . aspirin EC  81 mg Oral Daily  . carvedilol  6.25 mg Oral BID WC  . feeding supplement (ENSURE ENLIVE)  237 mL Oral BID BM  . fenofibrate  160 mg Oral  Daily  . insulin aspart  0-15 Units Subcutaneous TID WC  . insulin glargine  58 Units Subcutaneous BID  . lisinopril  2.5 mg Oral Daily  . mometasone-formoterol  2 puff Inhalation BID  . piperacillin-tazobactam (ZOSYN)  IV  3.375 g Intravenous 3 times per day  . tiotropium  18 mcg Inhalation Daily  . vancomycin  1,250 mg Intravenous Q12H    Continuous Infusions:   Past Medical History  Diagnosis Date  . Chronic shoulder pain     "right; I've got bad rotator cuff"  . COPD (chronic obstructive pulmonary disease)   . CAD, multiple vessel   . Hyperlipidemia   . GERD (gastroesophageal reflux  disease)   . Chest tightness   . Angina pectoris   . Wears glasses   . Anxiety   . Depression   . History of hiatal hernia   . 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 Myocardial infarction 11/2013    PCI w/ drug eluting stent LCx  . Asthma   . Hypothyroidism     PMH:  . IDDM (insulin dependent diabetes mellitus)   . Diabetes mellitus with complication   . Neuropathy due to secondary diabetes   . History of blood transfusion     "when I had my 1st youngun; when I had MI"  . History of stomach ulcers   . Headache     "monthly" (11/22/2014)  . Arthritis     "hands, neck, back, knees, hips, ankles" (11/22/2014)  . Chronic lower back pain     Past Surgical History  Procedure Laterality Date  . Cesarean section  1982; 1984  . Gastric resection  1990's    bezoar; "for reflux"  . Coronary angioplasty with stent placement  11/11/2013    Drug-eluting stent in LCx  . Breast surgery Left 1990's    breast duct surgery  . Colonoscopy    . Esophagogastroduodenoscopy    . Coronary artery bypass graft N/A 09/22/2014    Procedure: CORONARY ARTERY BYPASS GRAFTING (CABG);  Surgeon: Purcell Nails, MD;  Location[facility];  Service: Open Heart Surgery;  Laterality: N/A;  Times 2 using left internal mammary artery and endoscopically harvested right saphenous vein  . Tee without cardioversion N/A 09/22/2014    Procedure: TRANSESOPHAGEAL ECHOCARDIOGRAM (TEE);  Surgeon: Purcell Nails, MD;  Location: [facility] OR;  Service: Open Heart Surgery;  Laterality: N/A;  . Laparoscopic cholecystectomy  2000  . Abdominal hysterectomy  1990's  . Cardiac catheterization  09/01/14    Daris Aristizabal A. Mayford Knife, RD, LDN, CDE Pager: [phone] After hours Pager: [phone]

## 2014-11-23 NOTE — Transfer of Care (Signed)
Immediate Anesthesia Transfer of Care Note  Patient: Sara Hobbs  Procedure(s) Performed: Procedure(s): SUPERFICIAL STERNAL WOUND DEBRIDEMENT (N/A) POSSIBLE APPLICATION OF WOUND VAC (N/A)  Patient Location: PACU  Anesthesia Type:MAC  Level of Consciousness: awake, alert  and oriented  Airway & Oxygen Therapy: Patient Spontanous Breathing and Patient connected to face mask oxygen  Post-op Assessment: Report given to RN and Post -op Vital signs reviewed and stable  Post vital signs: Reviewed and stable  Last Vitals:  Filed Vitals:   11/23/14 1335  BP: 104/58  Pulse: 75  Temp: 36.7 C  Resp: 18    Complications: No apparent anesthesia complications

## 2014-11-23 NOTE — Progress Notes (Signed)
Pt is currently off floor in surgery per day shift nurse.  Pt has been gone since about 3 pm will continue to assist when pt returns from procedure.

## 2014-11-23 NOTE — Progress Notes (Signed)
Pt returned from surgery, she ambulated to BR voided continently, returned to bed, SCD's in place, wound vac noted to mid chest area with 125 ml suction in place, pt states "feel like I gor beat up"  pts blood sugar was back up to 139 by fingerstick in PACU.

## 2014-11-23 NOTE — Plan of Care (Signed)
Problem: Phase I Progression Outcomes Goal: Hemodynamically stable Patient had open heart surgery in Feb, chest wound opened on Friday and pt went to see her physician Monday, she was sent to hospital with dressing noted to chest.  CT of chest performed last nigh.  Pt will have daily dressing changes and antibiotics while in hospital.  Blood cultures were collected also at admission.  Goal: Anginal pain relieved Pt has not complained of chest pain since admission. Goal: Voiding-avoid urinary catheter unless indicated Pt voiding withiout difficulty, and ambulating to BR

## 2014-11-24 ENCOUNTER — Encounter (HOSPITAL_COMMUNITY): Payer: Self-pay | Admitting: Thoracic Surgery (Cardiothoracic Vascular Surgery)

## 2014-11-24 LAB — GLUCOSE, CAPILLARY
GLUCOSE-CAPILLARY: 99 mg/dL (ref 70–99)
GLUCOSE-CAPILLARY: 112 mg/dL — AB (ref 70–99)
GLUCOSE-CAPILLARY: 161 mg/dL — AB (ref 70–99)
Glucose-Capillary: 194 mg/dL — ABNORMAL HIGH (ref 70–99)

## 2014-11-24 LAB — HEMOGLOBIN A1C
HEMOGLOBIN A1C: 10 % — AB (ref 4.8–5.6)
Mean Plasma Glucose: 240 mg/dL

## 2014-11-24 MED ORDER — EPINEPHRINE HCL 0.1 MG/ML IJ SOSY
PREFILLED_SYRINGE | INTRAMUSCULAR | Status: AC
  Filled 2014-11-24: qty 10

## 2014-11-24 MED ORDER — DOCUSATE SODIUM 100 MG PO CAPS
200.0000 mg | ORAL_CAPSULE | Freq: Two times a day (BID) | ORAL | Status: DC
  Administered 2014-11-25 – 2014-11-26 (×3): 200 mg via ORAL
  Filled 2014-11-24 (×6): qty 2

## 2014-11-24 MED ORDER — CARVEDILOL 3.125 MG PO TABS
3.1250 mg | ORAL_TABLET | Freq: Two times a day (BID) | ORAL | Status: DC
  Administered 2014-11-24 – 2014-11-26 (×4): 3.125 mg via ORAL
  Filled 2014-11-24 (×6): qty 1

## 2014-11-24 MED ORDER — MORPHINE SULFATE 2 MG/ML IJ SOLN
1.0000 mg | INTRAMUSCULAR | Status: DC | PRN
  Administered 2014-11-25: 1 mg via INTRAVENOUS
  Filled 2014-11-24: qty 1

## 2014-11-24 MED ORDER — PROMETHAZINE HCL 25 MG/ML IJ SOLN
12.5000 mg | Freq: Four times a day (QID) | INTRAMUSCULAR | Status: DC | PRN
  Administered 2014-11-24 – 2014-11-26 (×5): 12.5 mg via INTRAVENOUS
  Filled 2014-11-24 (×6): qty 1

## 2014-11-24 MED ORDER — INSULIN GLARGINE 100 UNIT/ML ~~LOC~~ SOLN
50.0000 [IU] | Freq: Two times a day (BID) | SUBCUTANEOUS | Status: DC
  Administered 2014-11-24 – 2014-11-26 (×5): 50 [IU] via SUBCUTANEOUS
  Filled 2014-11-24 (×6): qty 0.5

## 2014-11-24 MED ORDER — PHENYLEPHRINE 40 MCG/ML (10ML) SYRINGE FOR IV PUSH (FOR BLOOD PRESSURE SUPPORT)
PREFILLED_SYRINGE | INTRAVENOUS | Status: AC
  Filled 2014-11-24: qty 10

## 2014-11-24 MED ORDER — ENOXAPARIN SODIUM 30 MG/0.3ML ~~LOC~~ SOLN
30.0000 mg | SUBCUTANEOUS | Status: DC
  Administered 2014-11-24 – 2014-11-25 (×2): 30 mg via SUBCUTANEOUS
  Filled 2014-11-24 (×3): qty 0.3

## 2014-11-24 NOTE — Progress Notes (Signed)
Inpatient Diabetes Program Recommendations  AACE/ADA: New Consensus Statement on Inpatient Glycemic Control (2013)  Target Ranges:  Prepandial:   less than 140 mg/dL      Peak postprandial:   less than 180 mg/dL (1-2 hours)      Critically ill patients:  140 - 180 mg/dL   Results for FERRIS, HERMOSILLO (MRN [id number]) as of 11/24/2014 10:18  Ref. Range 11/23/2014 06:52 11/23/2014 11:41 11/23/2014 14:44 11/23/2014 18:12 11/23/2014 21:19 11/24/2014 06:20  Glucose-Capillary Latest Range: 70-99 mg/dL 062 (H) 694 (H) 854 (H) 74 139 (H) 99   Diabetes history: DM2 Outpatient Diabetes medications: Lantus 58 units BID, Invokana 300 mg daily Current orders for Inpatient glycemic control: Lantus 50 units BID, Invokana 300 mg QAM, Novolog 0-15 units TID with meals  Inpatient Diabetes Program Recommendations Insulin - Basal: In reviewing the chart, noted patient only received one dose of Lantus yesterday. She received Lantus 58 units at 10:32 am on 3/22. Fasting glucose this morning is 99 mg/dl. Also noted, Invokana was restarted this morning. May want to consider decreasing Lantus and adjust as necessary to improve inpatient glycemic control.  HgbA1C: A1C 10.0% on 11/23/14.  Note: Spoke with patient about diabetes and home regimen for diabetes control. Patient reports that currently she takes Invokana 300 mg daily and Lantus 58 units BID as an outpatient for diabetes control.  Inquired about compliance with DM medications. Patient states that she "usually takes Lantus 58 units BID". However she does state that "some days she only takes the Lantus 58 units once a day or maybe not at all if she forgets about taking it".  According to the patient she reports that "since she was discharged from the hospital in January she has mostly taken the Lantus 58 units twice a day".  Inquired about glucose monitoring and patient states that she "may check her glucose once a week and it is mostly in the 200-300's mg/dl range".  Inquired about knowledge about A1C and patient reports that she knows what an A1C is and reports that her last A1C was over 16%. Discussed A1C results (10.0% on 11/23/14) and explained how A1C correlates with actual glucose values, basic pathophysiology of DM Type 2, basic home care, importance of checking CBGs and maintaining good CBG control to prevent long-term and short-term complications. Discussed impact of nutrition, exercise, stress, sickness, and medications on diabetes control. Stressed importance of glycemic control with wound healing.  Encouraged patient to check her glucose at least 2-3 times per day and to keep a log of glucose values to take with her to follow up visits. Explained how glucose values can be used to make adjustments with DM medications to improve glycemic control. Patient states that she will try to check her glucose more often as requested.  Patient verbalized understanding of information discussed and she states that she has no further questions at this time related to diabetes.   Thanks, Orlando Penner, RN, MSN, CCRN Diabetes Coordinator Inpatient Diabetes Program [phone] (Team Pager) [phone] ([facility] office) [phone] [facility] office)

## 2014-11-24 NOTE — Progress Notes (Signed)
CARDIAC REHAB PHASE I   PRE:  Rate/Rhythm: 77 SR    BP: sitting 120/82    SaO2: 98 RA  MODE:  Ambulation: 340 ft   POST:  Rate/Rhythm: 83     BP: sitting 126/76     SaO2: 98 RA   Pt very unsteady walking. No LOB. Pt continuously denied dizziness or lightheadedness but after walk pt sts she felt "drunk". Sts she had phenegren earlier for nausea. Return to sitting up in bed. Sts she will walk with husband later, warned pt to get RN to help due to equipment and unsteadiness. BP stable.  [phone]  Elissa Lovett Kamaili CES, ACSM 11/24/2014 12:08 PM

## 2014-11-24 NOTE — Consult Note (Addendum)
WOC consult requested for Vac dressing change assistance.  Negative pressure dressing applied yesterday in the OR to a full thickness sternal wound.  It is currently intact with a good seal to cont suction, mod amt yellow drainage in the cannister. Discussed plan of care with Dr Cornelius Moras; plan to change Vac tomorrow AM as requested. Pt will discharge home with Vac therapy. Cammie Mcgee MSN, RN, CWOCN, [place], CNS [phone]

## 2014-11-24 NOTE — Progress Notes (Addendum)
      [address]       [name] [address]             [phone]        1 Day Post-Op Procedure(s) (LRB): SUPERFICIAL STERNAL WOUND DEBRIDEMENT (N/A) POSSIBLE APPLICATION OF WOUND VAC (N/A)  Subjective: Patient states she is NOT taking BP medications as she feels bad because her BP is too low. Also, Zofran is not working for nausea.  Objective: Vital signs in last 24 hours: Temp:  [97.7 F (36.5 C)-98.6 F (37 C)] 98.6 F (37 C) (03/23 0450) Pulse Rate:  [67-84] 84 (03/23 0450) Cardiac Rhythm:  [-] Normal sinus rhythm (03/22 2130) Resp:  [7-19] 16 (03/23 0450) BP: (67-114)/(41-65) 96/50 mmHg (03/23 0450) SpO2:  [94 %-100 %] 98 % (03/23 0450)   Current Weight  11/22/14 204 lb 9.6 oz (92.806 kg)      Intake/Output from previous day: 03/22 0701 - 03/23 0700 In: 2240 [P.O.:840; I.V.:1400] Out: 900 [Urine:900]   Physical Exam:  Cardiovascular: RRR Pulmonary: Clear to auscultation bilaterally; no rales, wheezes, or rhonchi. Abdomen: Soft, non tender, bowel sounds present. Extremities: SCDs in place. Wounds: Clean and dry. VAC in place  Lab Results: CBC: Recent Labs  11/22/14 1834 11/23/14 0545  WBC 6.9 6.6  HGB 15.1* 14.5  HCT 44.0 43.0  PLT 226 229   BMET:  Recent Labs  11/22/14 1834  NA 133*  K 3.8  CL 98  CO2 23  GLUCOSE 302*  BUN 21  CREATININE 0.90  CALCIUM 9.8    PT/INR:  Lab Results  Component Value Date   INR 0.96 11/23/2014   INR 1.22 09/22/2014   INR 0.89 09/20/2014   ABG:  INR: Will add last result for INR, ABG once components are confirmed Will add last 4 CBG results once components are confirmed  Assessment/Plan:  1. CV - SR in the 60's. On Coreg 6.25 bid and Lisinopril 2.5 daily. SBP labile this am at 96/50. Will decrease Coreg (and give parameters) and stop Lisinopril for now. 2.  Pulmonary - On room air. Continue Dulera and Spiriva for COPD 3. ID-On Zosyn and Vanco for superficial sternal wound  infection 4.DM-CBGs 74/139/99. On Invokana 300 mg daily and Insulin 58 units bid. Will decrease Insulin as just starting to eat. 5. Stop Zofran and give Phenergan PRN for nausea 6. Likely change wound vac in am  ZIMMERMAN,DONIELLE MPA-C 11/24/2014,7:53 AM  I have seen and examined the patient and agree with the assessment and plan as outlined.  Follow up wound cultures to focus antibiotic therapy.  Anticipate possible d/c home in 2-3 days - will need home health for wound VAC  Purcell Nails 11/24/2014 9:15 AM   Preliminary wound cultures from 3/21 growing Staph aureus, sensitivities pending.  Will d/c Zosyn  Purcell Nails 11/24/2014 10:08 AM

## 2014-11-24 NOTE — Anesthesia Postprocedure Evaluation (Signed)
  Anesthesia Post-op Note  Patient: Sara Hobbs  Procedure(s) Performed: Procedure(s): SUPERFICIAL STERNAL WOUND DEBRIDEMENT (N/A) POSSIBLE APPLICATION OF WOUND VAC (N/A)  Patient Location: PACU  Anesthesia Type:MAC  Level of Consciousness: awake, alert , oriented and patient cooperative  Airway and Oxygen Therapy: Patient Spontanous Breathing  Post-op Pain: none  Post-op Assessment: Post-op Vital signs reviewed, Patient's Cardiovascular Status Stable, Respiratory Function Stable, Patent Airway, No signs of Nausea or vomiting, Adequate PO intake and Pain level controlled  Post-op Vital Signs: Reviewed and stable  Last Vitals:  Filed Vitals:   11/24/14 0450  BP: 96/50  Pulse: 84  Temp: 37 C  Resp: 16    Complications: No apparent anesthesia complications (delayed entry, pt eval in PACU)

## 2014-11-24 NOTE — OR Nursing (Signed)
Pt continues to intermittently be hypotensive despite receiving IV fluids as ordered.  Dr. Katrinka Blazing at bedside and ordered additional 500 cc bolus with goal of SBP >90.

## 2014-11-24 NOTE — OR Nursing (Signed)
Dr. Katrinka Blazing notified for sign out.  He stated he would.

## 2014-11-24 NOTE — Progress Notes (Signed)
1055 Cardiac Rehab On arrival pt in bed. She refused to walk states that she feels dizzy and it is her BP. I explained to pt that she needs three walks per and when would she like me to return. She ask for a hour. Will follow later. Beatrix Fetters, RN 11/24/2014 11:04 AM

## 2014-11-25 LAB — WOUND CULTURE: Gram Stain: NONE SEEN

## 2014-11-25 LAB — GLUCOSE, CAPILLARY
GLUCOSE-CAPILLARY: 274 mg/dL — AB (ref 70–99)
Glucose-Capillary: 92 mg/dL (ref 70–99)
Glucose-Capillary: 163 mg/dL — ABNORMAL HIGH (ref 70–99)
Glucose-Capillary: 175 mg/dL — ABNORMAL HIGH (ref 70–99)

## 2014-11-25 LAB — VANCOMYCIN, TROUGH: Vancomycin Tr: 20.5 ug/mL — ABNORMAL HIGH (ref 10.0–20.0)

## 2014-11-25 MED ORDER — VANCOMYCIN HCL IN DEXTROSE 750-5 MG/150ML-% IV SOLN
750.0000 mg | Freq: Two times a day (BID) | INTRAVENOUS | Status: DC
  Administered 2014-11-25: 750 mg via INTRAVENOUS
  Filled 2014-11-25 (×3): qty 150

## 2014-11-25 NOTE — Consult Note (Addendum)
WOC wound follow up Wound type: Vac dressing changed to sternal wound with physician assistant at bedside to assess wound during first post-op dressing change. Measurement:3.8X.8X1.2cm, tunneling to .5cm at 12:00 o'clock Wound bed: Beefy red with visible bone Drainage (amount, consistency, odor) Small amt yellow drainage, no odor Periwound:Pink dry scar tissue to previous surgical incision surrounding wound  Dressing procedure/placement/frequency: Applied one piece black foam to cont suction.  Pt tolerated with minimal discomfort after pain meds given prior to procedure. Pt plans to discharge home with Vac and home health assistance.  Bedside nurse can change Vac dressing Sat if patient is still in the hospital at that time. Please re-consult if further assistance is needed.  Thank-you,  Cammie Mcgee MSN, RN, CWOCN, [place], CNS [phone]

## 2014-11-25 NOTE — Progress Notes (Addendum)
      [address]       [place] [address]             [phone]        2 Days Post-Op Procedure(s) (LRB): SUPERFICIAL STERNAL WOUND DEBRIDEMENT (N/A) POSSIBLE APPLICATION OF WOUND VAC (N/A)  Subjective: Patient   Objective: Vital signs in last 24 hours: Temp:  [97.7 F (36.5 C)-98.3 F (36.8 C)] 98.3 F (36.8 C) (03/24 0437) Pulse Rate:  [67-77] 68 (03/24 0437) Cardiac Rhythm:  [-] Normal sinus rhythm (03/23 2345) Resp:  [18] 18 (03/24 0437) BP: (102-107)/(63-73) 103/63 mmHg (03/24 0437) SpO2:  [95 %-99 %] 95 % (03/24 0437) Weight:  [207 lb 7.3 oz (94.1 kg)] 207 lb 7.3 oz (94.1 kg) (03/24 0437)   Current Weight  11/25/14 207 lb 7.3 oz (94.1 kg)      Intake/Output from previous day: 03/23 0701 - 03/24 0700 In: 720 [P.O.:720] Out: -    Physical Exam:  Cardiovascular: RRR Pulmonary: Clear to auscultation bilaterally; no rales, wheezes, or rhonchi. Abdomen: Soft, non tender, bowel sounds present. Extremities: SCDs in place. Wounds: VAC in place. Later, VAC removed. Granulation present. Wound is mostly clean and there is no purulence.  Lab Results: CBC:  Recent Labs  11/22/14 1834 11/23/14 0545  WBC 6.9 6.6  HGB 15.1* 14.5  HCT 44.0 43.0  PLT 226 229   BMET:   Recent Labs  11/22/14 1834  NA 133*  K 3.8  CL 98  CO2 23  GLUCOSE 302*  BUN 21  CREATININE 0.90  CALCIUM 9.8    PT/INR:  Lab Results  Component Value Date   INR 0.96 11/23/2014   INR 1.22 09/22/2014   INR 0.89 09/20/2014   ABG:  INR: Will add last result for INR, ABG once components are confirmed Will add last 4 CBG results once components are confirmed  Assessment/Plan:  1. CV - Had 2.2 sec pause. SR in the 60's this am. On Coreg 3.125 bid with parameters. 2.  Pulmonary - On room air. Continue Dulera and Spiriva for COPD 3. ID-On  Vanco for superficial sternal wound infection. Office wound culture showed Staph Aureus. Gram stain showed no organisms,  PMN. Final wound culture is pending. 4.DM-CBGs 194/161/92. On Invokana 300 mg daily and Insulin 5. Stop Zofran and give Phenergan PRN for nausea 6. Wound vacchanged today.  7. Possibly home in am. Will need home health for wound vac  ZIMMERMAN,DONIELLE MPA-C 11/25/2014,7:39 AM    I have seen and examined the patient and agree with the assessment and plan as outlined.  Will make arrangements for home wound VAC.  D/C home once wound cultures finalized - if patient has MRSA will need PICC line for home IV antibiotics.  Purcell Nails 11/25/2014 8:44 AM   Addendum:  Original wound culture from 3/21 grew MRSA sensitive to Septra, Clindamycin, Tetracycline and Rifampin.  Given superficial nature of wound infection will treat with oral Septra.  Purcell Nails 11/26/2014 6:08 AM

## 2014-11-25 NOTE — Care Management Note (Signed)
    Page 1 of 2   11/26/2014     4:08:16 PM CARE MANAGEMENT NOTE 11/26/2014  Patient:  Sara Hobbs, Sara Hobbs   Account Number:  [id number]  Date Initiated:  11/25/2014  Documentation initiated by:  Donn Pierini  Subjective/Objective Assessment:   Pt admitted with sternal wound infection s/p I&D with wound VAC     Action/Plan:   PTA pt lived at home   Anticipated DC Date:  11/27/2014   Anticipated DC Plan:  HOME W HOME HEALTH SERVICES      DC Planning Services  CM consult      [facility] Choice  HOME HEALTH   Choice offered to / List presented to:  C-1 Patient        HH arranged  HH-1 RN      [facility] agency  [facility]   Status of service:  Completed, signed off Medicare Important Message given?   (If response is "NO", the following Medicare IM given date fields will be blank) Date Medicare IM given:   Medicare IM given by:   Date Additional Medicare IM given:   Additional Medicare IM given by:    Discharge Disposition:  HOME W HOME HEALTH SERVICES  Per UR Regulation:  Reviewed for med. necessity/level of care/duration of stay  If discussed at Long Length of Stay Meetings, dates discussed:    Comments:  11/26/14- 1130- Donn Pierini RN, bSN [phone] Spoke with Rickie at [facility] pt has been approved for home wound VAC- estimated time for delivery is 2 pm today- bedside RN and pt aware of timing- have notified [facility] of pt's discharge home today- HH services to begin tomorrow with VAC drsg change.  11/25/14- 1430- Donn Pierini RN, BSN [phone] Referral for home wound VAC- HHRN for drsg changes- T/T/S- have spoken with Rickie [facility] wound VAC rep. regarding need for home wound VAC- faxed paperwork and have wound VAC order form on chart for signature. Spoke with pt at bedside regarding HH needs- list of HH agencies for [facility] presented to pt- per pt choice will use [facility] for services- referral called to Hilda Lias with [facility] for HH-RN- wound VAC drsg changes - pt may need home IV abx-  [facility] can also manage this if needed- will await MD finial decision regarding this. [facility] aware of potential need. 1630- Wound VAC order form signed- and have faxed form to [facility] - will await approval confirmation from [facility] on home wound VAC.

## 2014-11-25 NOTE — Discharge Summary (Addendum)
Physician Discharge Summary       [address] [employer]       [name] [address]             [phone]    Patient ID: Sara Hobbs MRN: [id number] DOB/AGE: May 13, 1961 54 y.o.  Admit date: 11/22/2014 Discharge date: 11/26/2014  Admission Diagnoses: 1. Superficial sternal wound infection 2. 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 History of DM 4. History of MI 5. History of hyperlipidemia 6. History of COPD 7. History of GERD 8. History of tobacco abuse 9. History of hypothyroidism  Discharge Diagnoses:  1. Superficial sternal wound infection (MRSA) 2. 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 History of DM 4. History of MI 5. History of hyperlipidemia 6. History of COPD 7. History of GERD 8. History of tobacco abuse 9. History of hypothyroidism  Consults: Wound care  Procedure (s):   Sternal wound debridement  Placement of wound VAC device by Dr. Cornelius Moras on 11/23/2014.  History of Presenting Illness: This is a 54 yo obese white female who is well known to [facility]. She is S/P CABG x 2 performed 09/22/2014 for severe 3-vessel CAD with unstable angina. Her hospital course was uncomplicated. She has done well post operatively and has been seen for routine visits in our office. However, she contacted the office today for complaints of sternal drainage. The patient states that last week on Thursday she was sick and vomiting. She states that she also noticed some greenish yellow drainage from her sternal incision. She states she had her husband look at it and the wound was in fact open. She went to a local urgent care center who prescribed her Keflex, but for whatever reason did not get a wound culture. Currently the patient states that she has some mild chest discomfort. She denies sternal clicking. She does state she had a few fevers last week which were around 100. Her husband states her incision was red but this has improved since the antibiotics started at the urgent care. Her  blood sugars remain uncontrolled and she continues to smoke.  A wound culture was obtained and she was admitted to [facility] for further evaluation and management (IV antibiotics).  Brief Hospital Course:  CT chest showed likely delayed sternal union, no abcess or drainable collection, and incidentally, non calcified nodules in the periphery of the left lung. She underwent sternal wound debridement and placement of wound VAC on 3/22. SHe was placed on Vanconycin and Zosyn IV. She remained afebrile and hemodynamically stable. Preliminary wound culture showed Staph Aureus. Zosyn was stopped. Office wound culture on 11/22/2014 showed MRSA (sensitive to to Septra, Clindamycin, Tetracycline, and Rifampin). Per Dr. Cornelius Moras, stop Vancomycin and start Septra (as infection is superficial). We will continue Septra for 2 weeks. We will await final wound cultures. Wound vac was changed yesterday. The sternal wound is clean, granulating and continuing to heal. There is no purulence. Home health will be arranged to change the wound vac. She is tolerating a diet and has had a bowel movement. Her BP was labile and medications were adjusted accordingly. Her diabetes is better controlled and she will need close follow up with her medical doctor after discharge. She is felt surgically stable for discharge today.   Latest Vital Signs: Blood pressure 115/69, pulse 78, temperature 97.8 F (36.6 C), temperature source Oral, resp. rate 18, height 5\' 3"  (1.6 m), weight 208 lb 8.9 oz (94.6 kg), SpO2 99 %.  Physical Exam: Cardiovascular: RRR  Pulmonary: Clear to auscultation bilaterally; no rales, wheezes, or rhonchi. Abdomen: Soft, non tender, bowel sounds present. Extremities: SCDs in place. Wounds: VAC in place and no surrounding erythema.  Discharge Condition:Stable and discharged to home  Recent laboratory studies:  Lab Results  Component Value Date   WBC 6.6 11/23/2014   HGB 14.5 11/23/2014   HCT 43.0 11/23/2014    MCV 87.9 11/23/2014   PLT 229 11/23/2014   Lab Results  Component Value Date   NA 133* 11/22/2014   K 3.8 11/22/2014   CL 98 11/22/2014   CO2 23 11/22/2014   CREATININE 0.90 11/22/2014   GLUCOSE 302* 11/22/2014      Diagnostic Studies: Ct Chest Wo Contrast  11/23/2014   CLINICAL DATA:  Sternal cellulitis  EXAM: CT CHEST WITHOUT CONTRAST  TECHNIQUE: Multidetector CT imaging of the chest was performed following the standard protocol without IV contrast.  COMPARISON:  Radiographs 10/25/2014  FINDINGS: There are sternotomy wires. The sternotomy defect remains visible although it is apparently a little over 24-month-old, and there appears to be resorption of bone at the margins. This is concerning for infection. No abscess or drainable collection is evident within the sternum or deep to the sternum. Soft tissues superficial to the sternum exhibit mild subcutaneous edema without a drainable collection. There is an open wound with a little air extending to a depth of about 13 mm.  The lungs are notable only for a few small noncalcified nodules in the periphery of the left lung measuring 3-5 mm. Airways are patent. There are no effusions. There is no mediastinal or hilar adenopathy.  IMPRESSION: 1. The sternotomy exhibits bony resorption about the margins consistent with delayed union. Infection is a possibility. 2. No abscess or drainable collection deep to the sternum or superficial to the sternum. 3. There are several 3-5 mm noncalcified nodules in the periphery of the left lung. If the patient is at high risk for bronchogenic carcinoma, follow-up chest CT at 6-12 months is recommended. If the patient is at low risk for bronchogenic carcinoma, follow-up chest CT at 12 months is recommended. This recommendation follows the consensus statement: Guidelines for Management of Small Pulmonary Nodules Detected on CT Scans: A Statement from the Fleischner Society as published in Radiology 2005;237:395-400.    Electronically Signed   By: Ellery Plunk M.D.   On: 11/23/2014 03:33   Discharge Medications:   Medication List    STOP taking these medications        cephALEXin 500 MG capsule  Commonly known as:  KEFLEX      TAKE these medications        albuterol 108 (90 BASE) MCG/ACT inhaler  Commonly known as:  PROVENTIL HFA;VENTOLIN HFA  Inhale 2 puffs into the lungs every 6 (six) hours as needed for wheezing or shortness of breath.     aspirin EC 81 MG tablet  Take 81 mg by mouth daily.     canagliflozin 300 MG Tabs tablet  Commonly known as:  INVOKANA  Take 300 mg by mouth daily before breakfast.     carvedilol 3.125 MG tablet  Commonly known as:  COREG  Take 1 tablet (3.125 mg total) by mouth 2 (two) times daily with a meal.     fenofibrate 145 MG tablet  Commonly known as:  TRICOR  Take 145 mg by mouth daily.     Fluticasone-Salmeterol 100-50 MCG/DOSE Aepb  Commonly known as:  ADVAIR  Inhale 1 puff into the lungs 2 (two)  times daily.     glucose blood test strip  1 each by Other route 3 (three) times daily. Use as instructed     insulin glargine 100 UNIT/ML injection  Commonly known as:  LANTUS  Inject 58 Units into the skin 2 (two) times daily.     Insulin Pen Needle 31G X 8 MM Misc  by Does not apply route. USE AS DIRECTED     lisinopril 2.5 MG tablet  Commonly known as:  PRINIVIL,ZESTRIL  Take 1 tablet (2.5 mg total) by mouth daily.  Start taking on:  11/29/2014     Oxycodone HCl 10 MG Tabs  Take 1 tablet (10 mg total) by mouth every 8 (eight) hours as needed (pain).     promethazine 25 MG tablet  Commonly known as:  PHENERGAN  Take 25 mg by mouth every 6 (six) hours as needed for nausea or vomiting.     sulfamethoxazole-trimethoprim 400-80 MG per tablet  Commonly known as:  BACTRIM,SEPTRA  Take 1 tablet by mouth every 12 (twelve) hours.     ticagrelor 90 MG Tabs tablet  Commonly known as:  BRILINTA  Take by mouth 2 (two) times daily.     tiotropium  18 MCG inhalation capsule  Commonly known as:  SPIRIVA  Place 18 mcg into inhaler and inhale daily.        Follow Up Appointments: Follow-up Information    Follow up with Purcell Nails, MD On 12/06/2014.   Specialty:  Cardiothoracic Surgery   Why:  Appointment time is at 1:30 pm   Contact information:   [address] [employer] [address] [place] [address] [phone]       Follow up with [facility].   Why:  HH-RN for wound VAC drsg changes   Contact information:   [address] [place] [address] [phone]       Follow up with Dr. Andi Devon.   Why:  Call for an appointment for 2 weeks for further diabetes managment   Contact information:   [address] [place], [place] [address] [phone]      Signed: Doree Fudge MPA-C 11/26/2014, 9:17 AM

## 2014-11-25 NOTE — Discharge Instructions (Signed)
The patient may return to driving an automobile as long as they are no longer requiring oral narcotic pain relievers during the daytime.  It would be wise to start driving only short distances during the daylight and gradually increase from there as they feel comfortable. The patient should continue to avoid any heavy lifting or strenuous use of arms or shoulders for at least a total of three months from the time of surgery. The patient should make every effort to stop smoking immediately and permanently.The patient is reminded to make every effort to keep their diabetes under very tight control.  They should follow up closely with their primary care physician or endocrinologist and strive to keep their hemoglobin A1c levels as low as possible, preferably near or below 6.0

## 2014-11-25 NOTE — Progress Notes (Signed)
ANTIBIOTIC CONSULT NOTE - INITIAL  Pharmacy Consult for vancomycin, zosyn Indication: sternal wound infection  Allergies  Allergen Reactions  . Wellbutrin [Bupropion] Other (See Comments)    SEVERE CHEST PAIN    Patient Measurements: Height: 5\' 3"  (160 cm) Weight: 207 lb 7.3 oz (94.1 kg) IBW/kg (Calculated) : 52.4 Adjusted Body Weight: n/a  Vital Signs: Temp: 98.3 F (36.8 C) (03/24 0437) Temp Source: Oral (03/24 0437) BP: 103/63 mmHg (03/24 0437) Pulse Rate: 68 (03/24 0437) Intake/Output from previous day: 03/23 0701 - 03/24 0700 In: 720 [P.O.:720] Out: -  Intake/Output from this shift: Total I/O In: 240 [P.O.:240] Out: -   Labs:  Recent Labs  11/22/14 1834 11/23/14 0545  WBC 6.9 6.6  HGB 15.1* 14.5  PLT 226 229  CREATININE 0.90  --    Estimated Creatinine Clearance: 78.9 mL/min (by C-G formula based on Cr of 0.9).  Recent Labs  11/25/14 0515  VANCOTROUGH 20.5*     Medical History: Past Medical History  Diagnosis Date  . Chronic shoulder pain     "right; I've got bad rotator cuff"  . COPD (chronic obstructive pulmonary disease)   . CAD, multiple vessel   . Hyperlipidemia   . GERD (gastroesophageal reflux disease)   . Chest tightness   . Angina pectoris   . Wears glasses   . Anxiety   . Depression   . History of hiatal hernia   . 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 Myocardial infarction 11/2013    PCI w/ drug eluting stent LCx  . Asthma   . Hypothyroidism     PMH:  . IDDM (insulin dependent diabetes mellitus)   . Diabetes mellitus with complication   . Neuropathy due to secondary diabetes   . History of blood transfusion     "when I had my 1st youngun; when I had MI"  . History of stomach ulcers   . Headache     "monthly" (11/22/2014)  . Arthritis     "hands, neck, back, knees, hips, ankles" (11/22/2014)  . Chronic lower back pain     Medications:  Scheduled:  . aspirin EC  81 mg Oral Daily  .  canagliflozin  300 mg Oral QAC breakfast  . carvedilol  3.125 mg Oral BID WC  . Chlorhexidine Gluconate Cloth  6 each Topical Q0600  . docusate sodium  200 mg Oral BID  . enoxaparin (LOVENOX) injection  30 mg Subcutaneous Q24H  . feeding supplement (GLUCERNA SHAKE)  237 mL Oral TID BM  . fenofibrate  160 mg Oral Daily  . insulin aspart  0-15 Units Subcutaneous TID WC  . insulin glargine  50 Units Subcutaneous BID  . mometasone-formoterol  2 puff Inhalation BID  . mupirocin ointment  1 application Nasal BID  . tiotropium  18 mcg Inhalation Daily  . vancomycin  750 mg Intravenous Q12H   Assessment: 54 yo female s/p CABG x 2 on 09/22/14 admitted with sternal wound infection/cellulitis.  Wound cultures growing staph, awaiting final growth.  Vancomycin trough returned supratherapeutic at 20.5. Baseline Scr appears to be 0.5, now up to 0.9, eCrCl ~ 70-75 ml/min.  Received zosyn from 3/21-3/23.  Goal of Therapy:  Vancomycin trough 10-15 mcg/ml  Plan:  -Reduce vancomycin to 750 mg IV q12h -Follow renal fx, final cultures, VT as indicated    Agapito Games, PharmD, BCPS Clinical Pharmacist Pager: [phone] 11/25/2014 10:46 AM

## 2014-11-25 NOTE — Progress Notes (Signed)
Call from CCMD stating patient had 2.2sec pause. RN was in room at time assisting patient OOB to go to BR. Due to wound vac and pain patient was noted to be bearing down and holding breath upon sitting up in bed. Denies dizziness, lightheadeness, or even noticing pause in HR. Patient was assisted to [facility] by RN. Will continue to monitor.

## 2014-11-25 NOTE — Progress Notes (Signed)
CARDIAC REHAB PHASE I   PRE:  Rate/Rhythm: 70 SR  BP:  Supine:   Sitting: 110/70  Standing:    SaO2: 97 RA  MODE:  Ambulation: 300 ft   POST:  Rate/Rhythm: 74  BP:  Supine:   Sitting: 100/74  Standing:    SaO2: 95 RA [id number] On arrival pt in bed sleeping. Pt woke easily. Assisted X 1 to ambulate. Gait wobbly and unsteady at times. She c/o of feeling nauseated walking and ask to go back to room. Pt back to bed after walk, refused to go to recliner. She states she wants to nap. Call light in reach.  Melina Copa RN 11/25/2014 11:39 AM

## 2014-11-26 LAB — GLUCOSE, CAPILLARY
Glucose-Capillary: 88 mg/dL (ref 70–99)
Glucose-Capillary: 164 mg/dL — ABNORMAL HIGH (ref 70–99)

## 2014-11-26 LAB — WOUND CULTURE

## 2014-11-26 MED ORDER — OXYCODONE HCL 10 MG PO TABS: 10.0000 mg | ORAL_TABLET | Freq: Three times a day (TID) | ORAL | Status: DC | PRN

## 2014-11-26 MED ORDER — TICAGRELOR 90 MG PO TABS
90.0000 mg | ORAL_TABLET | Freq: Two times a day (BID) | ORAL | Status: DC
Start: 2014-11-26 — End: 2014-11-26
  Administered 2014-11-26: 90 mg via ORAL
  Filled 2014-11-26 (×2): qty 1

## 2014-11-26 MED ORDER — SULFAMETHOXAZOLE-TRIMETHOPRIM 400-80 MG PO TABS: 1.0000 | ORAL_TABLET | Freq: Two times a day (BID) | ORAL | Status: DC

## 2014-11-26 MED ORDER — CARVEDILOL 3.125 MG PO TABS: 3.1250 mg | ORAL_TABLET | Freq: Two times a day (BID) | ORAL | Status: DC

## 2014-11-26 MED ORDER — LISINOPRIL 2.5 MG PO TABS: 2.5000 mg | ORAL_TABLET | Freq: Every day | ORAL | Status: DC

## 2014-11-26 MED ORDER — SULFAMETHOXAZOLE-TRIMETHOPRIM 400-80 MG PO TABS
1.0000 | ORAL_TABLET | Freq: Two times a day (BID) | ORAL | Status: DC
  Administered 2014-11-26: 1 via ORAL
  Filled 2014-11-26 (×2): qty 1

## 2014-11-26 NOTE — Progress Notes (Signed)
      [address]       [name] [address]             [phone]        3 Days Post-Op Procedure(s) (LRB): SUPERFICIAL STERNAL WOUND DEBRIDEMENT (N/A) POSSIBLE APPLICATION OF WOUND VAC (N/A)  Subjective: Patient would like to go home. She has no specific complaints.  Objective: Vital signs in last 24 hours: Temp:  [97.5 F (36.4 C)-98.4 F (36.9 C)] 97.8 F (36.6 C) (03/25 0609) Pulse Rate:  [76-78] 78 (03/25 0609) Cardiac Rhythm:  [-] Normal sinus rhythm (03/24 2024) Resp:  [18] 18 (03/25 0609) BP: (92-115)/(49-69) 115/69 mmHg (03/25 0609) SpO2:  [98 %-99 %] 99 % (03/25 0609) Weight:  [208 lb 8.9 oz (94.6 kg)] 208 lb 8.9 oz (94.6 kg) (03/25 0609)   Current Weight  11/26/14 208 lb 8.9 oz (94.6 kg)      Intake/Output from previous day: 03/24 0701 - 03/25 0700 In: 480 [P.O.:480] Out: -    Physical Exam:  Cardiovascular: RRR Pulmonary: Clear to auscultation bilaterally; no rales, wheezes, or rhonchi. Abdomen: Soft, non tender, bowel sounds present. Extremities: SCDs in place. Wounds: VAC in place and no surrounding erythema.  Lab Results: CBC: No results for input(s): WBC, HGB, HCT, PLT in the last 72 hours. BMET:  No results for input(s): NA, K, CL, CO2, GLUCOSE, BUN, CREATININE, CALCIUM in the last 72 hours.  PT/INR:  Lab Results  Component Value Date   INR 0.96 11/23/2014   INR 1.22 09/22/2014   INR 0.89 09/20/2014   ABG:  INR: Will add last result for INR, ABG once components are confirmed Will add last 4 CBG results once components are confirmed  Assessment/Plan:  1. CV - SR in the 70's this am. On Coreg 3.125 bid with parameters. 2.  Pulmonary - On room air. Continue Dulera and Spiriva for COPD 3. ID-On Vanco and Septra for superficial sternal wound infection. Original wound culture from 3/21 showed MRSA (sensitive to Septra, Clindamycin, Tetracycline, and Rifampin). Will stop Vanco.  4.DM-CBGs 175/274/88. On Invokana 300  mg daily and Insulin 5. Home today and will need home health for wound vac.   Camber Ninh MPA-C 11/26/2014,8:18 AM

## 2014-11-26 NOTE — Significant Event (Signed)
CRITICAL VALUE ALERT  Critical value received:  MRSA from wound culture  Date of notification:  11/26/2014  Time of notification:  1200  Critical value read back:Yes.    Nurse who received alert:  Juliann Pares RN  MD notified (1st page):  Doree Fudge PA  Time of first page:  1203  MD notified (2nd page):  Time of second page:  Responding MD:  Doree Fudge PA  Time MD responded:  1207

## 2014-11-26 NOTE — Significant Event (Signed)
Discussed discharge instructions with patient and changed VAC to home VAC.  Explained all the directions to patient in turn verbalizing and demonstrating understanding.  Home health to see patient 3/26.  All belongings gathered, awaiting transportation home.

## 2014-11-27 DIAGNOSIS — Z48812 Encounter for surgical aftercare following surgery on the circulatory system: Secondary | ICD-10-CM | POA: Diagnosis not present

## 2014-11-28 LAB — ANAEROBIC CULTURE

## 2014-11-29 LAB — CULTURE, BLOOD (ROUTINE X 2)
CULTURE: NO GROWTH
Culture: NO GROWTH

## 2014-12-01 ENCOUNTER — Other Ambulatory Visit: Payer: Self-pay

## 2014-12-02 ENCOUNTER — Other Ambulatory Visit: Payer: Self-pay

## 2014-12-02 DIAGNOSIS — R110 Nausea: Secondary | ICD-10-CM

## 2014-12-02 MED ORDER — PROMETHAZINE HCL 25 MG PO TABS: 25.0000 mg | ORAL_TABLET | Freq: Three times a day (TID) | ORAL | Status: DC | PRN

## 2014-12-02 NOTE — Telephone Encounter (Signed)
RX for promethazine 25 mg sent to [facility].

## 2014-12-03 ENCOUNTER — Other Ambulatory Visit: Payer: Self-pay | Admitting: Thoracic Surgery (Cardiothoracic Vascular Surgery)

## 2014-12-03 ENCOUNTER — Other Ambulatory Visit: Payer: Self-pay | Admitting: *Deleted

## 2014-12-03 DIAGNOSIS — Z951 Presence of aortocoronary bypass graft: Secondary | ICD-10-CM

## 2014-12-06 ENCOUNTER — Ambulatory Visit (INDEPENDENT_AMBULATORY_CARE_PROVIDER_SITE_OTHER): Payer: Self-pay | Admitting: Physician Assistant

## 2014-12-06 ENCOUNTER — Ambulatory Visit
Admission: RE | Admit: 2014-12-06 | Discharge: 2014-12-06 | Disposition: A | Discharge status: Home / Self Care | Payer: BLUE CROSS/BLUE SHIELD | Source: Ambulatory Visit | Attending: Thoracic Surgery (Cardiothoracic Vascular Surgery) | Admitting: Thoracic Surgery (Cardiothoracic Vascular Surgery)

## 2014-12-06 VITALS — BP 130/82 | HR 96 | Temp 98.4°F | Resp 20 | Ht 63.0 in | Wt 208.0 lb

## 2014-12-06 DIAGNOSIS — T8132XA Disruption of internal operation (surgical) wound, not elsewhere classified, initial encounter: Secondary | ICD-10-CM

## 2014-12-06 DIAGNOSIS — Z951 Presence of aortocoronary bypass graft: Secondary | ICD-10-CM

## 2014-12-06 DIAGNOSIS — I2510 Atherosclerotic heart disease of native coronary artery without angina pectoris: Secondary | ICD-10-CM

## 2014-12-06 MED ORDER — ONDANSETRON HCL 4 MG PO TABS: 4.0000 mg | ORAL_TABLET | Freq: Three times a day (TID) | ORAL | Status: DC | PRN

## 2014-12-06 NOTE — Progress Notes (Signed)
[address]       [name] [address]             [phone]          HPI: Patient returns for hospital follow-up. She is status post CABG on 09/22/2014 and did well initially postop. However, she presented to our office on 11/22/2014 complaining of bloody drainage and superficial separation of her sternal wound that started after she vomited. She was admitted for further evaluation and treatment. She was started empirically on IV antibiotics and a CT of the chest was performed which showed no evidence of deep sternal infection or wound dehiscence. She underwent a sternal debridement with VAC placement by Dr. Cornelius Moras on 11/23/2014. 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 Antibiotic coverage was switched to po Septra and the patient was discharged home on  11/25/2014.  Since discharge, the patient reports having continued intermittent nausea and vomiting despite Phenergan.  Home health is changing her wound VAC 3 times a week, and reportedly, the wound is granulating well.  She denies pain, fever, chills, or drainage.  She has 3 more days' worth of Septra.  She states her blood sugars have been controlled at times, but sometimes are elevated.  She does have an appointment with her primary MD later this week.    Current Outpatient Prescriptions  Medication Sig Dispense Refill  . albuterol (PROVENTIL HFA;VENTOLIN HFA) 108 (90 BASE) MCG/ACT inhaler Inhale 2 puffs into the lungs every 6 (six) hours as needed for wheezing or shortness of breath.    Marland Kitchen aspirin EC 81 MG tablet Take 81 mg by mouth daily.    . canagliflozin (INVOKANA) 300 MG TABS tablet Take 300 mg by mouth daily before breakfast.    . carvedilol (COREG) 3.125 MG tablet Take 1 tablet (3.125 mg total) by mouth 2 (two) times daily with a meal. 60 tablet 1  . fenofibrate (TRICOR) 145 MG tablet Take 145 mg by mouth daily.    . Fluticasone-Salmeterol (ADVAIR) 100-50 MCG/DOSE AEPB Inhale 1 puff  into the lungs 2 (two) times daily.    Marland Kitchen glucose blood test strip 1 each by Other route 3 (three) times daily. Use as instructed    . insulin glargine (LANTUS) 100 UNIT/ML injection Inject 58 Units into the skin 2 (two) times daily.     . Insulin Pen Needle 31G X 8 MM MISC by Does not apply route. USE AS DIRECTED    . lisinopril (PRINIVIL,ZESTRIL) 2.5 MG tablet Take 1 tablet (2.5 mg total) by mouth daily. 30 tablet 3  . Oxycodone HCl 10 MG TABS Take 1 tablet (10 mg total) by mouth every 8 (eight) hours as needed (pain). 30 tablet 0  . promethazine (PHENERGAN) 25 MG tablet Take 1 tablet (25 mg total) by mouth every 8 (eight) hours as needed for nausea. 30 tablet 0  . sulfamethoxazole-trimethoprim (BACTRIM,SEPTRA) 400-80 MG per tablet Take 1 tablet by mouth every 12 (twelve) hours. 26 tablet 0  . ticagrelor (BRILINTA) 90 MG TABS tablet Take by mouth 2 (two) times daily.    Marland Kitchen tiotropium (SPIRIVA) 18 MCG inhalation capsule Place 18 mcg into inhaler and inhale daily.    . ondansetron (ZOFRAN) 4 MG tablet Take 1 tablet (4 mg total) by mouth every 8 (eight) hours as needed for nausea or vomiting. 20 tablet 0   No current facility-administered medications for this visit.     Physical Exam: BP 130/82 HR 96  Resp 20 Wounds: Sternal wound VAC is in place, no surrounding erythema or drainage.  Sternum is stable. Heart: regular rate and rhythm Lungs: Clear to auscultation Extremities: Mild edema   Diagnostic Tests: Chest xray: Dg Chest 2 View  12/06/2014   CLINICAL DATA:  Status post CABG on September 22, 2014 ; sternal wound debridement and application of wound vac on November 23, 2014, focal chest discomfort at the incision site; history of COPD, diabetes.  EXAM: CHEST  2 VIEW  COMPARISON:  CT scan of the chest of November 22, 2014.  FINDINGS: The lungs are adequately inflated and clear. The heart and pulmonary vascularity are normal. There are 8 intact sternal wires. There is no pleural effusion,  pneumothorax, or pneumomediastinum. The retrosternal soft tissues are normal on the lateral film.  IMPRESSION: 1. There is no mediastinal widening or retrosternal soft tissue fluid collection. The sternal wires are intact. 2. There is no CHF.   Electronically Signed   By: David  [place]   On: 12/06/2014 13:53       Assessment/Plan: The patient's sternal wound is reportedly healing well.  She states that the [facility] changed her VAC today and did not want the wound examined in the office.  Reportedly, the nurse says the wound is granulating well, and there has been no drainage or purulence.  She has 3 more days of Septra to complete the course.  I have asked her to follow up in one week with Dr. Cornelius Moras and plan to have the [facility] changed in the office and that time.  I also gave her a prescription for Zofran at her request to help with her intermittent nausea.  She has an appointment later this week with her primary MD to recheck her diabetes control.

## 2014-12-13 ENCOUNTER — Ambulatory Visit (INDEPENDENT_AMBULATORY_CARE_PROVIDER_SITE_OTHER): Payer: Self-pay | Admitting: Thoracic Surgery (Cardiothoracic Vascular Surgery)

## 2014-12-13 ENCOUNTER — Encounter: Payer: Self-pay | Admitting: Thoracic Surgery (Cardiothoracic Vascular Surgery)

## 2014-12-13 VITALS — BP 134/88 | HR 96 | Resp 16 | Ht 63.0 in | Wt 200.0 lb

## 2014-12-13 DIAGNOSIS — R911 Solitary pulmonary nodule: Secondary | ICD-10-CM

## 2014-12-13 DIAGNOSIS — I2510 Atherosclerotic heart disease of native coronary artery without angina pectoris: Secondary | ICD-10-CM

## 2014-12-13 DIAGNOSIS — Z951 Presence of aortocoronary bypass graft: Secondary | ICD-10-CM

## 2014-12-13 DIAGNOSIS — L0390 Cellulitis, unspecified: Secondary | ICD-10-CM | POA: Insufficient documentation

## 2014-12-13 NOTE — Patient Instructions (Signed)
STOP SMOKING  FOLLOW UP WITH PRIMARY CARE MD AND ENDOCRINOLOGIST TO BRING DIABETES UNDER CONTROL

## 2014-12-13 NOTE — Progress Notes (Signed)
[address]       [name] [address]             [phone]     CARDIOTHORACIC SURGERY OFFICE NOTE  Referring Provider is Diamond Nickel., MD  PCP is Lavell Islam, MD   HPI:  Patient returns for follow-up and wound check status post coronary artery bypass grafting 2 on 09/22/2014 for severe three-vessel coronary artery disease with unstable angina. Her early postoperative recovery was uncomplicated. 3 weeks ago the patient developed nausea and vomiting, after which time she developed some bloody drainage from the mid portion of her sternal incision. She presented to our office on 11/22/2014 at which time she was noted to have developed some superficial fat necrosis with a mild superficial wound infection. She was reflected matted to the hospital where she underwent sternal wound debridement with placement of a wound VAC device on 11/23/2014. The wound infection was only superficial and there was no evidence for penetration of the deep fascia.  She was last seen here in our office on 12/06/2014 and she returns to the office today for wound check. By report, the home health nurse assisting with wound care states that things have been progressing nicely without complication. The patient states that she feels well. She has mild residual soreness in her chest. She has no fevers or chills. There is no sensation of clicking or motion of the sternum. She claims that her blood sugars have been under a little better control, now typically staying less than 200. Unfortunately, she continues to smoke cigarettes.  Of note, CT scan of the chest performed during the patient's hospitalization on 11/22/2014 revealed a few small noncalcified nodules in the periphery of the left lung which are not very suspicious but certainly require long-term follow-up.   Current Outpatient Prescriptions  Medication Sig Dispense Refill  . albuterol (PROVENTIL HFA;VENTOLIN HFA) 108 (90 BASE) MCG/ACT  inhaler Inhale 2 puffs into the lungs every 6 (six) hours as needed for wheezing or shortness of breath.    Marland Kitchen aspirin EC 81 MG tablet Take 81 mg by mouth daily.    . canagliflozin (INVOKANA) 300 MG TABS tablet Take 300 mg by mouth daily before breakfast.    . carvedilol (COREG) 3.125 MG tablet Take 1 tablet (3.125 mg total) by mouth 2 (two) times daily with a meal. 60 tablet 1  . fenofibrate (TRICOR) 145 MG tablet Take 145 mg by mouth daily.    . Fluticasone-Salmeterol (ADVAIR) 100-50 MCG/DOSE AEPB Inhale 1 puff into the lungs 2 (two) times daily.    Marland Kitchen glucose blood test strip 1 each by Other route 3 (three) times daily. Use as instructed    . insulin glargine (LANTUS) 100 UNIT/ML injection Inject 58 Units into the skin 2 (two) times daily.     . Insulin Pen Needle 31G X 8 MM MISC by Does not apply route. USE AS DIRECTED    . lisinopril (PRINIVIL,ZESTRIL) 2.5 MG tablet Take 1 tablet (2.5 mg total) by mouth daily. 30 tablet 3  . ondansetron (ZOFRAN) 4 MG tablet Take 1 tablet (4 mg total) by mouth every 8 (eight) hours as needed for nausea or vomiting. 20 tablet 0  . promethazine (PHENERGAN) 25 MG tablet Take 1 tablet (25 mg total) by mouth every 8 (eight) hours as needed for nausea. 30 tablet 0  . ticagrelor (BRILINTA) 90 MG TABS tablet Take by mouth 2 (two) times daily.    Marland Kitchen tiotropium (  SPIRIVA) 18 MCG inhalation capsule Place 18 mcg into inhaler and inhale daily.    . Oxycodone HCl 10 MG TABS Take 1 tablet (10 mg total) by mouth every 8 (eight) hours as needed (pain). (Patient taking differently: Take 15 mg by mouth every 8 (eight) hours as needed (pain). ) 30 tablet 0   No current facility-administered medications for this visit.      Physical Exam:   BP 134/88 mmHg  Pulse 96  Resp 16  Ht  (1.6 m)  Wt 200 lb (90.719 kg)  BMI 35.44 kg/m2  SpO2 96%  General:  Chronically ill-appearing but no distress  Chest:   Clear to auscultation  CV:   Regular rate and  rhythm  Incisions:  Sternal wound is examined. There is healthy beefy red granulation tissue throughout. There is no purulent drainage. The wound probes to a depth of approximately 2 cm. There are no comp gaiting features and there is no surrounding cellulitis.  Abdomen:  Soft and nontender  Extremities:  Warm and well-perfused  Diagnostic Tests:  n/a   Impression:  Satisfactory appearance of small open wound midportion of median sternotomy incision. This wound is progressing nicely with healthy granulation tissue throughout. There is no sign of surrounding cellulitis or ongoing infection at this time. The patient's underlying medical problems are still a chronic issue with inadequate control of diabetes and continued tobacco abuse.  Plan:  We will continue wound VAC therapy for the time being. I would anticipate that within the next 2-4 weeks the wound should continue to granulate in, ultimately to the point where wound VAC therapy will be discontinued. The patient has been reminded how important will be for her to get her diabetes under control and to stop smoking permanently. We will plan to see her back in 4 weeks for wound check.    Salvatore Decent. Cornelius Moras, MD 12/13/2014 2:35 PM

## 2015-01-05 LAB — AFB CULTURE WITH SMEAR (NOT AT ARMC): Acid Fast Smear: NONE SEEN

## 2015-01-06 ENCOUNTER — Ambulatory Visit: Payer: BLUE CROSS/BLUE SHIELD | Admitting: Endocrinology

## 2015-01-10 ENCOUNTER — Ambulatory Visit (INDEPENDENT_AMBULATORY_CARE_PROVIDER_SITE_OTHER): Payer: BLUE CROSS/BLUE SHIELD | Admitting: Thoracic Surgery (Cardiothoracic Vascular Surgery)

## 2015-01-10 ENCOUNTER — Encounter: Payer: Self-pay | Admitting: Thoracic Surgery (Cardiothoracic Vascular Surgery)

## 2015-01-10 VITALS — BP 156/96 | HR 102 | Resp 20 | Ht 63.0 in | Wt 200.0 lb

## 2015-01-10 DIAGNOSIS — Z951 Presence of aortocoronary bypass graft: Secondary | ICD-10-CM

## 2015-01-10 DIAGNOSIS — L0390 Cellulitis, unspecified: Secondary | ICD-10-CM | POA: Diagnosis not present

## 2015-01-10 DIAGNOSIS — T8132XA Disruption of internal operation (surgical) wound, not elsewhere classified, initial encounter: Secondary | ICD-10-CM | POA: Diagnosis not present

## 2015-01-10 DIAGNOSIS — I2510 Atherosclerotic heart disease of native coronary artery without angina pectoris: Secondary | ICD-10-CM

## 2015-01-10 NOTE — Progress Notes (Signed)
[address]       [name] [address]             [phone]     CARDIOTHORACIC SURGERY OFFICE NOTE  Referring Provider is Diamond Nickel., MD  PCP is Lavell Islam, MD   HPI:  Patient returns for follow-up and wound check status post coronary artery bypass grafting 2 on 09/22/2014 for severe three-vessel coronary artery disease with unstable angina. Her early postoperative recovery was uncomplicated.  In March she presented with superficial fat necrosis and a superficial wound infection involving the middle portion of her median sternotomy incision. She underwent local wound debridement and placement of a wound VAC on 11/23/2014. She has done well since then and was most recently seen in follow-up in our office on 12/13/2014. She returns to our office for follow-up and wound check. She reports no fevers or chills. There've been no problems or complications with wound VAC management. By report the home health nurse reports no particular problems but she is concerned by the slow progress of the wound healing. The patient's blood sugars remain under marginal control although the patient states that they have been improving. Fasting blood sugars in the morning had previously been greater than 200, and they are now typically averaging between 150 and 170. The patient continues to smoke cigarettes.  The patient denies any shortness of breath. She denies any exertional chest discomfort.   Current Outpatient Prescriptions  Medication Sig Dispense Refill  . albuterol (PROVENTIL HFA;VENTOLIN HFA) 108 (90 BASE) MCG/ACT inhaler Inhale 2 puffs into the lungs every 6 (six) hours as needed for wheezing or shortness of breath.    Marland Kitchen aspirin EC 81 MG tablet Take 81 mg by mouth daily.    . canagliflozin (INVOKANA) 300 MG TABS tablet Take 300 mg by mouth daily before breakfast.    . carvedilol (COREG) 3.125 MG tablet Take 1 tablet (3.125 mg total) by mouth 2 (two) times daily with a  meal. 60 tablet 1  . fenofibrate (TRICOR) 145 MG tablet Take 145 mg by mouth daily.    . Fluticasone-Salmeterol (ADVAIR) 100-50 MCG/DOSE AEPB Inhale 1 puff into the lungs 2 (two) times daily.    Marland Kitchen glucose blood test strip 1 each by Other route 3 (three) times daily. Use as instructed    . insulin glargine (LANTUS) 100 UNIT/ML injection Inject 58 Units into the skin 2 (two) times daily.     . Insulin Pen Needle 31G X 8 MM MISC by Does not apply route. USE AS DIRECTED    . lisinopril (PRINIVIL,ZESTRIL) 2.5 MG tablet Take 1 tablet (2.5 mg total) by mouth daily. 30 tablet 3  . ondansetron (ZOFRAN) 4 MG tablet Take 1 tablet (4 mg total) by mouth every 8 (eight) hours as needed for nausea or vomiting. 20 tablet 0  . Oxycodone HCl 10 MG TABS Take 1 tablet (10 mg total) by mouth every 8 (eight) hours as needed (pain). (Patient taking differently: Take 15 mg by mouth every 8 (eight) hours as needed (pain). ) 30 tablet 0  . promethazine (PHENERGAN) 25 MG tablet Take 1 tablet (25 mg total) by mouth every 8 (eight) hours as needed for nausea. 30 tablet 0  . ticagrelor (BRILINTA) 90 MG TABS tablet Take by mouth 2 (two) times daily.    Marland Kitchen tiotropium (SPIRIVA) 18 MCG inhalation capsule Place 18 mcg into inhaler and inhale daily.     No current facility-administered medications for this  visit.      Physical Exam:   BP 156/96 mmHg  Pulse 102  Resp 20  Ht  (1.6 m)  Wt 200 lb (90.719 kg)  BMI 35.44 kg/m2  SpO2 97%  General:  Well-appearing  Chest:   Clear to auscultation  CV:   Regular rate and rhythm  Incisions:  Small open wound middle of the sternotomy scar that has 100% healthy E Fite read granulation tissue throughout. At the superior aspect this wound tunnels to approximately 1.5-2.0 cm in depth. The majority of it is much more shallow. There is no purulence whatsoever. There are no exposed sternal wires.  Abdomen:  soft  Extremities:  warm  Diagnostic  Tests:  n/a   Impression:  Satisfactory appearance of small open wound midportion of median sternotomy incision. This wound is progressing nicely with healthy granulation tissue throughout. There is no sign of surrounding cellulitis or ongoing infection at this time. There are no exposed sternal wires.  The patient's underlying medical problems are still a chronic issue with inadequate control of diabetes and continued tobacco abuse.   Plan:  We will continue wound VAC therapy indefinitely. There are no indications for need to proceed with wound debridement or sternal wire removal at this time. I have reiterated with the patient and her husband the fact that continued tobacco abuse and poor glycemic control adversely affects wound healing. More importantly, this will severely adversely affect patient's longevity. The patient will return for follow-up and wound check in 6 weeks or sooner as needed.   Salvatore Decent. Cornelius Moras, MD 01/10/2015 2:40 PM

## 2015-01-10 NOTE — Patient Instructions (Signed)
Continue wound VAC without change in therapy at this time  The patient should make every effort to stop smoking immediately and permanently.  The patient is reminded to make every effort to keep their diabetes under very tight control.  They should follow up closely with their primary care physician or endocrinologist and strive to keep their hemoglobin A1c levels as low as possible, preferably near or below 6.0

## 2015-01-17 ENCOUNTER — Ambulatory Visit: Payer: BLUE CROSS/BLUE SHIELD | Admitting: Thoracic Surgery (Cardiothoracic Vascular Surgery)

## 2015-01-24 ENCOUNTER — Ambulatory Visit: Payer: Self-pay | Admitting: Thoracic Surgery (Cardiothoracic Vascular Surgery)

## 2015-01-26 ENCOUNTER — Other Ambulatory Visit: Payer: Self-pay | Admitting: *Deleted

## 2015-01-26 ENCOUNTER — Ambulatory Visit (INDEPENDENT_AMBULATORY_CARE_PROVIDER_SITE_OTHER): Payer: BLUE CROSS/BLUE SHIELD | Admitting: Endocrinology

## 2015-01-26 ENCOUNTER — Other Ambulatory Visit: Payer: Self-pay | Admitting: Physician Assistant

## 2015-01-26 ENCOUNTER — Encounter: Payer: Self-pay | Admitting: Endocrinology

## 2015-01-26 VITALS — BP 114/64 | HR 108 | Temp 97.7°F | Resp 16 | Ht 63.0 in | Wt 210.2 lb

## 2015-01-26 DIAGNOSIS — E785 Hyperlipidemia, unspecified: Secondary | ICD-10-CM | POA: Diagnosis not present

## 2015-01-26 DIAGNOSIS — G629 Polyneuropathy, unspecified: Secondary | ICD-10-CM

## 2015-01-26 DIAGNOSIS — E1142 Type 2 diabetes mellitus with diabetic polyneuropathy: Secondary | ICD-10-CM | POA: Diagnosis not present

## 2015-01-26 DIAGNOSIS — Z48812 Encounter for surgical aftercare following surgery on the circulatory system: Secondary | ICD-10-CM | POA: Diagnosis not present

## 2015-01-26 DIAGNOSIS — E1165 Type 2 diabetes mellitus with hyperglycemia: Secondary | ICD-10-CM | POA: Diagnosis not present

## 2015-01-26 DIAGNOSIS — IMO0002 Reserved for concepts with insufficient information to code with codable children: Secondary | ICD-10-CM

## 2015-01-26 LAB — POCT URINALYSIS DIPSTICK
BILIRUBIN UA: NEGATIVE
Glucose, UA: 2000
KETONES UA: NEGATIVE
LEUKOCYTES UA: NEGATIVE
NITRITE UA: NEGATIVE
Protein, UA: NEGATIVE
RBC UA: NEGATIVE
Spec Grav, UA: 1.02
UROBILINOGEN UA: 0.2
pH, UA: 5

## 2015-01-26 LAB — COMPREHENSIVE METABOLIC PANEL
ALT: 36 U/L — ABNORMAL HIGH (ref 0–35)
AST: 20 U/L (ref 0–37)
Albumin: 4.3 g/dL (ref 3.5–5.2)
Alkaline Phosphatase: 77 U/L (ref 39–117)
BILIRUBIN TOTAL: 0.3 mg/dL (ref 0.2–1.2)
BUN: 22 mg/dL (ref 6–23)
CALCIUM: 9.7 mg/dL (ref 8.4–10.5)
CO2: 29 meq/L (ref 19–32)
CREATININE: 0.9 mg/dL (ref 0.40–1.20)
Chloride: 101 mEq/L (ref 96–112)
GFR: 69.5 mL/min (ref >60.00)
Glucose, Bld: 158 mg/dL — ABNORMAL HIGH (ref 70–99)
Potassium: 3.7 mEq/L (ref 3.5–5.1)
Sodium: 136 mEq/L (ref 135–145)
TOTAL PROTEIN: 7.3 g/dL (ref 6.0–8.3)

## 2015-01-26 LAB — HEMOGLOBIN A1C: HEMOGLOBIN A1C: 9.1 % — AB (ref 4.6–6.5)

## 2015-01-26 LAB — MICROALBUMIN / CREATININE URINE RATIO
CREATININE, U: 37.1 mg/dL
MICROALB/CREAT RATIO: 1.9 mg/g (ref 0.0–30.0)
Microalb, Ur: <0.7 mg/dL (ref 0.0–1.9)

## 2015-01-26 MED ORDER — GABAPENTIN 100 MG PO CAPS: ORAL_CAPSULE | ORAL | Status: DC

## 2015-01-26 MED ORDER — GLUCOSE BLOOD VI STRP: ORAL_STRIP | Status: DC

## 2015-01-26 MED ORDER — METFORMIN HCL ER 500 MG PO TB24: ORAL_TABLET | ORAL | Status: DC

## 2015-01-26 NOTE — Patient Instructions (Addendum)
Check blood sugars on waking up 4-5 times a week Also check blood sugars about 2 hours after a meal at least once a day and do this after different meals by rotation  Recommended blood sugar levels on waking up is 90-130 and about 2 hours after meal is 140-180 Please bring blood sugar monitor to each visit.  Reduce Invokana to 1/2 daily before breakfast  LANTUS insulin: Reduce the dose to 50 units twice a day If waking up with low readings may reduce the evening dose to 40  METFORMIN ER: Start taking one tablet at suppertime daily  Stop drinking sweet tea

## 2015-01-26 NOTE — Progress Notes (Signed)
Patient ID: Sara Hobbs, female   DOB: 07/27/1961, 54 y.o.   MRN: [id number]           Reason for Appointment: Consultation for Type 2 Diabetes  Referring physician: Cloward  History of Present Illness:          Date of diagnosis of type 2 diabetes mellitus :        Background history:  She thinks she was treated with metformin for the first few years Although she says she was having some diarrhea with metformin she continued to take it but was probably taking 500 mg twice a day; not clear if she was taking the extended release her not She does not know if she took her medications for her diabetes with metformin At some point metformin was stopped because of kidney function abnormality and insulin started, probably in 2002 She thinks she has been on Lantus insulin most of the time Her blood sugar control has been consistently poor and records from PCP indicate that A1c has been at least 9% 10 as high as 13% since 2013  Recent history:  Because of her persistently high blood sugars she is referred here for further management by her PCP  INSULIN regimen is described as: 70 bid pen      Current blood sugar patterns and problems identified:    although her blood sugars have been mostly high they are appearing much lower in the last 10 days  However she is checking blood sugars mostly before her first meal and generally once a day.  This can be at variable times  She will not take any insulin if her blood sugars below 100 or so in the morning  She will periodically forget to take the insulin at night  Oral hypoglycemic drugs the patient is taking are: Invokana 300 mg daily      Side effects from medications have been: Diarrhea from metformin, candidiasis from Invokana  Compliance with the medical regimen: Fair Hypoglycemia:   recently has had 3 episodes of blood sugars below 65, mostly around noon  Glucose monitoring:  done usually one  times a day         Glucometer: One  Touch.      Blood Glucose readings by time of day and averages from meter download:  PREMEAL Breakfast Lunch Dinner Bedtime  Overall   Glucose range:  50-265       Median:  175      173    POST-MEAL PC Breakfast PC Lunch PC Dinner  Glucose range:  66, 75     Median:      Self-care: The diet that the patient has been following is: tries to limit fat fats    Meal times: Breakfast: Variable Lunch: Variable Dinner: 6 pm  Typical meal intake: Breakfast is applesauce.  Will have a sandwich for lunch and meat and vegetables for dinner.  Has applesauce or chips for snacks.  Will drink sweet tea regularly               Dietician visit, most recent:               Exercise:  unable to do any  Weight history: max 260  Wt Readings from Last 3 Encounters:  01/26/15 210 lb 3.2 oz (95.346 kg)  01/10/15 200 lb (90.719 kg)  12/13/14 200 lb (90.719 kg)    Glycemic control:   Lab Results  Component Value Date   HGBA1C 10.0* 11/23/2014  HGBA1C 11.3* 09/20/2014   Lab Results  Component Value Date   CREATININE 0.90 11/22/2014         Medication List       This list is accurate as of: 01/26/15  3:26 PM.  Always use your most recent med list.               albuterol 108 (90 BASE) MCG/ACT inhaler  Commonly known as:  PROVENTIL HFA;VENTOLIN HFA  Inhale 2 puffs into the lungs every 6 (six) hours as needed for wheezing or shortness of breath.     aspirin EC 81 MG tablet  Take 81 mg by mouth daily.     canagliflozin 300 MG Tabs tablet  Commonly known as:  INVOKANA  Take 300 mg by mouth daily before breakfast.     carvedilol 3.125 MG tablet  Commonly known as:  COREG  Take 1 tablet (3.125 mg total) by mouth 2 (two) times daily with a meal.     fenofibrate 145 MG tablet  Commonly known as:  TRICOR  Take 145 mg by mouth daily.     Fluticasone-Salmeterol 100-50 MCG/DOSE Aepb  Commonly known as:  ADVAIR  Inhale 1 puff into the lungs 2 (two) times daily.     glucose blood test  strip  Commonly known as:  ONE TOUCH ULTRA TEST  Use as instructed to check blood sugar 3 times per day dx code E11.8     insulin glargine 100 UNIT/ML injection  Commonly known as:  LANTUS  Inject 70 Units into the skin 2 (two) times daily.     Insulin Pen Needle 31G X 8 MM Misc  by Does not apply route. USE AS DIRECTED     lisinopril 2.5 MG tablet  Commonly known as:  PRINIVIL,ZESTRIL  Take 1 tablet (2.5 mg total) by mouth daily.     metFORMIN 500 MG 24 hr tablet  Commonly known as:  GLUCOPHAGE-XR  2 tablets at supper     ondansetron 4 MG tablet  Commonly known as:  ZOFRAN  Take 1 tablet (4 mg total) by mouth every 8 (eight) hours as needed for nausea or vomiting.     Oxycodone HCl 10 MG Tabs  Take 1 tablet (10 mg total) by mouth every 8 (eight) hours as needed (pain).     promethazine 25 MG tablet  Commonly known as:  PHENERGAN  Take 1 tablet (25 mg total) by mouth every 8 (eight) hours as needed for nausea.     ticagrelor 90 MG Tabs tablet  Commonly known as:  BRILINTA  Take by mouth 2 (two) times daily.     tiotropium 18 MCG inhalation capsule  Commonly known as:  SPIRIVA  Place 18 mcg into inhaler and inhale daily.        Allergies:  Allergies  Allergen Reactions  . Wellbutrin [Bupropion] Other (See Comments)    SEVERE CHEST PAIN    Past Medical History  Diagnosis Date  . Chronic shoulder pain     "right; I've got bad rotator cuff"  . COPD (chronic obstructive pulmonary disease)   . CAD, multiple vessel   . Hyperlipidemia   . GERD (gastroesophageal reflux disease)   . Chest tightness   . Angina pectoris   . Wears glasses   . Anxiety   . Depression   . History of hiatal hernia   . 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  Myocardial infarction 11/2013    PCI w/ drug eluting stent LCx  . Asthma   . Hypothyroidism     PMH:  . IDDM (insulin dependent diabetes mellitus)   . Diabetes mellitus with complication   .  Neuropathy due to secondary diabetes   . History of blood transfusion     "when I had my 1st youngun; when I had MI"  . History of stomach ulcers   . Headache     "monthly" (11/22/2014)  . Arthritis     "hands, neck, back, knees, hips, ankles" (11/22/2014)  . Chronic lower back pain   . Incidental lung nodule, > 31mm and < 46mm 11/22/2014    Small non-calcified nodules left lung    Past Surgical History  Procedure Laterality Date  . Cesarean section  1982; 1984  . Gastric resection  1990's    bezoar; "for reflux"  . Coronary angioplasty with stent placement  11/11/2013    Drug-eluting stent in LCx  . Breast surgery Left 1990's    breast duct surgery  . Colonoscopy    . Esophagogastroduodenoscopy    . Coronary artery bypass graft N/A 09/22/2014    Procedure: CORONARY ARTERY BYPASS GRAFTING (CABG);  Surgeon: Purcell Nails, MD;  Location: [facility] OR;  Service: Open Heart Surgery;  Laterality: N/A;  Times 2 using left internal mammary artery and endoscopically harvested right saphenous vein  . Tee without cardioversion N/A 09/22/2014    Procedure: TRANSESOPHAGEAL ECHOCARDIOGRAM (TEE);  Surgeon: Purcell Nails, MD;  Location: [facility] OR;  Service: Open Heart Surgery;  Laterality: N/A;  . Laparoscopic cholecystectomy  2000  . Abdominal hysterectomy  1990's  . Cardiac catheterization  09/01/14  . Sternal wound debridement N/A 11/23/2014    Procedure: SUPERFICIAL STERNAL WOUND DEBRIDEMENT;  Surgeon: Purcell Nails, MD;  Location: [facility] OR;  Service: Thoracic;  Laterality: N/A;  . Application of wound vac N/A 11/23/2014    Procedure: POSSIBLE APPLICATION OF WOUND VAC;  Surgeon: Purcell Nails, MD;  Location: [facility] OR;  Service: Thoracic;  Laterality: N/A;    Family History  Problem Relation Age of Onset  . Adopted: Yes  . Family history unknown: Yes    Social History:  reports that she has been smoking Cigarettes.  She has a 82 pack-year smoking history. She quit smokeless tobacco use about 9 years ago.  Her smokeless tobacco use included Chew. She reports that she does not drink alcohol or use illicit drugs.    Review of Systems    Lipid history: Has had markedly increased triglycerides, last level was 405 from PCP, LDL has been below 100 in 4801.  Not clear this is being managed by cardiologist in [facility]   No results found for: CHOL, HDL, LDLCALC, LDLDIRECT, TRIG, CHOLHDL         Constitutional: no recent weight gain/loss, no complaints of unusual fatigue   Eyes: no history of blurred vision.  Most recent eye exam was 2014  ENT: no nasal congestion, difficulty swallowing, no hoarseness   Cardiovascular: no chest pain or tightness on exertion.  No leg swelling.  Hypertension: Not present  Respiratory: no cough/shortness of breath  Gastrointestinal: no constipation, diarrhea recently. Some nausea present at times, no recent  abdominal pain Her appetite is variable  Musculoskeletal: no muscle/joint aches   Urological:   No frequency of urination or  Nocturia  Skin: no rash or infections  Neurological: no headaches.   Has long-standing history of numbness, burning, pains or  tingling in feet and lower legs.  Symptoms may occur during the daytime.  Has not been on medications for this  Psychiatric:   symptoms of depression  Endocrine: No unusual fatigue, cold intolerance or history of thyroid disease  Gynecological: She has been in menopause.  She said that previously she would have difficulties with infrequent but heavy menstrual cycles and ovulated only once a year  She said that she has periodic yeast infections since starting Invokana; having 1 recently.  She treats this with Monistat as needed and usually this resolves the problem    Physical Examination:  BP 114/64 mmHg  Pulse 108  Temp(Src) 97.7 F (36.5 C)  Resp 16  Ht 5\' 3"  (1.6 m)  Wt 210 lb 3.2 oz (95.346 kg)  BMI 37.24 kg/m2  SpO2 97%  GENERAL:         Patient has generalized obesity.   HEENT:          Eye exam shows normal eyelids, conjunctiva and cornea  Fundus exam shows no retinopathy. Oral exam shows dry mucosa .  NECK:   There is no lymphadenopathy Thyroid is not enlarged and no nodules felt.  Carotids are normal to palpation and no bruit heard LUNGS:         Chest is symmetrical. Lungs are clear to auscultation.Marland Kitchen   HEART:         Heart sounds:  S1 and S2 are normal. No murmurs or clicks heard., no S3 or S4.   ABDOMEN:   There is no distention present. Liver and spleen are not palpable. No other mass or tenderness present.   NEUROLOGICAL:   Vibration sense is  reduced markedly in distal first toes. Ankle jerks are absent bilaterally.          Monofilament sensation markedly decreased in the distal toes Pedal pulses are normal MUSCULOSKELETAL:  There is no swelling or deformity of the peripheral joints. Spine is normal to inspection.   EXTREMITIES:     There is no edema. No skin lesions present.Marland Kitchen SKIN:       No rash.  She has mild acanthosis of the neck.  She has facial hirsutism present    ASSESSMENT:  Diabetes type 2, uncontrolled       The patient has had persistently high blood sugars along with insulin resistance for several years.   She is taking 140 units of insulin a day  For some time.  Although she had been taking Invokana for sometime this has not generally helped her control and she still has A1c  numbers as high as 10%   However her blood sugars appear to be dramatically lower in the last 10 days including tendency to hypoglycemia.  This is without any change in her diet or significant change in weight  She is not really familiar with  Meal planning or other aspects of diabetes self-care.  She is not having balanced meals consistently and her meal planning is irregular.  Also consuming sweet tea which is probably contributed to some of her hyperglycemia  Currently she is not able to exercise  Complications: She has  Significant neuropathy which has been present for  some time  PLAN:     Since her sugars have been relatively low in the morning she can reduce her insulin by 20 units twice a day for now.   May consider using Evaristo Bury or Toujeo for once a day convenience and even control    Discussed need to check blood  sugars after meals also and she is not currently doing this.  Discussed that she may have postprandial hyperglycemia which we do not know about and potentially may need mealtime insulin coverage also.   Also because of her low normal blood pressure as well as tendency to recurrent candidiasis she can reduce her Invokana to half of the 300 mg tablet for now   She will be given a trial of gabapentin 100 mg as needed for symptoms of her neuropathy.   For her insulin resistance will give her a trial of metformin ER which should be better tolerated than regular metformin.  She can titrate up to 1000 mg a day at least if tolerated  Improve diet as discussed  A1c to be checked along with urine microalbumin  Patient Instructions  Check blood sugars on waking up 4-5 times a week Also check blood sugars about 2 hours after a meal at least once a day and do this after different meals by rotation  Recommended blood sugar levels on waking up is 90-130 and about 2 hours after meal is 140-180 Please bring blood sugar monitor to each visit.  Reduce Invokana to 1/2 daily before breakfast  LANTUS insulin: Reduce the dose to 50 units twice a day If waking up with low readings may reduce the evening dose to 40  METFORMIN ER: Start taking one tablet at suppertime daily  Stop drinking sweet tea      Sara Hobbs 01/26/2015, 3:26 PM   Note: This office note was prepared with Insurance underwriter. Any transcriptional errors that result from this process are unintentional.

## 2015-02-07 ENCOUNTER — Ambulatory Visit (INDEPENDENT_AMBULATORY_CARE_PROVIDER_SITE_OTHER): Payer: BLUE CROSS/BLUE SHIELD | Admitting: Thoracic Surgery (Cardiothoracic Vascular Surgery)

## 2015-02-07 ENCOUNTER — Encounter: Payer: Self-pay | Admitting: Thoracic Surgery (Cardiothoracic Vascular Surgery)

## 2015-02-07 VITALS — BP 108/60 | HR 71 | Resp 16 | Ht 63.0 in | Wt 210.0 lb

## 2015-02-07 DIAGNOSIS — Z951 Presence of aortocoronary bypass graft: Secondary | ICD-10-CM

## 2015-02-07 DIAGNOSIS — T8132XA Disruption of internal operation (surgical) wound, not elsewhere classified, initial encounter: Secondary | ICD-10-CM

## 2015-02-07 DIAGNOSIS — I25119 Atherosclerotic heart disease of native coronary artery with unspecified angina pectoris: Secondary | ICD-10-CM

## 2015-02-07 DIAGNOSIS — I2510 Atherosclerotic heart disease of native coronary artery without angina pectoris: Secondary | ICD-10-CM

## 2015-02-07 DIAGNOSIS — L0390 Cellulitis, unspecified: Secondary | ICD-10-CM

## 2015-02-07 DIAGNOSIS — R911 Solitary pulmonary nodule: Secondary | ICD-10-CM

## 2015-02-07 NOTE — Progress Notes (Signed)
[address]       [name] [address]             [phone]     CARDIOTHORACIC SURGERY OFFICE NOTE  Referring Provider is Diamond Nickel., MD  PCP is Lavell Islam, MD   HPI:  Patient returns for follow-up and wound check status post coronary artery bypass grafting 2 on 09/22/2014 for severe three-vessel coronary artery disease with unstable angina. Her early postoperative recovery was uncomplicated. In March she presented with superficial fat necrosis and a superficial wound infection involving the middle portion of her median sternotomy incision. She underwent local wound debridement and placement of a wound VAC on 11/23/2014. She has done well since then and was most recently seen in follow-up in our office on 01/10/2015. Since then she has continued to do well and she returns for routine follow-up and wound check today. She reports that her diabetes has been under very good control. She is now seeing Dr. Lucianne Muss.  Unfortunately she continues to smoke cigarettes. She reports no problems with wound care. Her small open wound has continued to contract in size. There is no reported significant drainage and there have been no complications managing wound VAC   Current Outpatient Prescriptions  Medication Sig Dispense Refill  . albuterol (PROVENTIL HFA;VENTOLIN HFA) 108 (90 BASE) MCG/ACT inhaler Inhale 2 puffs into the lungs every 6 (six) hours as needed for wheezing or shortness of breath.    Marland Kitchen aspirin EC 81 MG tablet Take 81 mg by mouth daily.    . canagliflozin (INVOKANA) 300 MG TABS tablet Take 300 mg by mouth daily before breakfast.    . carvedilol (COREG) 3.125 MG tablet Take 1 tablet (3.125 mg total) by mouth 2 (two) times daily with a meal. 60 tablet 1  . Fluticasone-Salmeterol (ADVAIR) 100-50 MCG/DOSE AEPB Inhale 1 puff into the lungs 2 (two) times daily.    Marland Kitchen glucose blood (ONE TOUCH ULTRA TEST) test strip Use as instructed to check blood sugar 3 times  per day dx code E11.8 100 each 3  . insulin glargine (LANTUS) 100 UNIT/ML injection Inject 70 Units into the skin 2 (two) times daily.     . Insulin Pen Needle 31G X 8 MM MISC by Does not apply route. USE AS DIRECTED    . lisinopril (PRINIVIL,ZESTRIL) 2.5 MG tablet Take 1 tablet (2.5 mg total) by mouth daily. 30 tablet 3  . metFORMIN (GLUCOPHAGE-XR) 500 MG 24 hr tablet 2 tablets at supper 60 tablet 3  . morphine (MSIR) 15 MG tablet Take 15 mg by mouth every 12 (twelve) hours.    . Oxycodone HCl 10 MG TABS Take 10 mg by mouth every 8 (eight) hours as needed.    . promethazine (PHENERGAN) 25 MG tablet Take 1 tablet (25 mg total) by mouth every 8 (eight) hours as needed for nausea. 30 tablet 0  . ticagrelor (BRILINTA) 90 MG TABS tablet Take by mouth 2 (two) times daily.    Marland Kitchen tiotropium (SPIRIVA) 18 MCG inhalation capsule Place 18 mcg into inhaler and inhale daily.    . fenofibrate (TRICOR) 145 MG tablet Take 145 mg by mouth daily.     No current facility-administered medications for this visit.      Physical Exam:   BP 108/60 mmHg  Pulse 71  Resp 16  Ht 5\' 3"  (1.6 m)  Wt 210 lb (95.255 kg)  BMI 37.21 kg/m2  SpO2 98%  General:  Obese  and chronically ill appearing  Chest:   Clear to auscultation  CV:   Regular rate and rhythm  Incisions:  Small open superficial sternal wound has contracted considerably in size. It now measures approximately 1 cm in length and 1 cm depth and less than 1 cm width. There is healthy beefy granulation tissue with no sign of any purulent drainage. The remainder of the sternal wound has essentially healed completely and the sternum is stable.  Abdomen:  Soft nontender  Extremities:  Warm and well-perfused  Diagnostic Tests:  n/a   Impression:  Satisfactory appearance of small open wound midportion of median sternotomy incision. This wound is progressing nicely with healthy granulation tissue throughout. There is no sign of surrounding cellulitis or ongoing  infection at this time. I suspect that within the next few weeks her wound will become too small to continue wound VAC therapy. The patient's underlying medical problems are still a chronic issue with continued tobacco abuse but improved glycemic control.   Plan:  We will continue wound VAC therapy for now with the anticipation that at some point within the next few weeks the wound will become small enough that wound VAC therapy will no longer be feasible. The patient will return for follow-up and wound check in 6 weeks.    I spent in excess of 10 minutes during the conduct of this office consultation and >50% of this time involved direct face-to-face encounter with the patient for counseling and/or coordination of their care.    Salvatore Decent. Cornelius Moras, MD 02/07/2015 12:29 PM

## 2015-02-07 NOTE — Patient Instructions (Signed)
Continue to make every effort to keep your diabetes under tight control  Find a way to quit smoking  No changes in wound care are recommended at this time.  Continue wound VAC management

## 2015-02-14 ENCOUNTER — Other Ambulatory Visit: Payer: Self-pay | Admitting: Physician Assistant

## 2015-02-22 ENCOUNTER — Ambulatory Visit: Payer: BLUE CROSS/BLUE SHIELD | Admitting: Nutrition

## 2015-02-23 ENCOUNTER — Ambulatory Visit (INDEPENDENT_AMBULATORY_CARE_PROVIDER_SITE_OTHER): Payer: BLUE CROSS/BLUE SHIELD | Admitting: Endocrinology

## 2015-02-23 ENCOUNTER — Encounter: Payer: BLUE CROSS/BLUE SHIELD | Attending: Endocrinology | Admitting: Nutrition

## 2015-02-23 ENCOUNTER — Other Ambulatory Visit: Payer: Self-pay | Admitting: *Deleted

## 2015-02-23 ENCOUNTER — Encounter: Payer: Self-pay | Admitting: Endocrinology

## 2015-02-23 VITALS — BP 114/64 | HR 49 | Temp 98.0°F | Resp 18 | Ht 64.0 in | Wt 212.0 lb

## 2015-02-23 DIAGNOSIS — E785 Hyperlipidemia, unspecified: Secondary | ICD-10-CM | POA: Diagnosis not present

## 2015-02-23 DIAGNOSIS — Z713 Dietary counseling and surveillance: Secondary | ICD-10-CM | POA: Diagnosis not present

## 2015-02-23 DIAGNOSIS — E1165 Type 2 diabetes mellitus with hyperglycemia: Secondary | ICD-10-CM | POA: Diagnosis not present

## 2015-02-23 DIAGNOSIS — E118 Type 2 diabetes mellitus with unspecified complications: Secondary | ICD-10-CM | POA: Insufficient documentation

## 2015-02-23 DIAGNOSIS — Z794 Long term (current) use of insulin: Secondary | ICD-10-CM | POA: Insufficient documentation

## 2015-02-23 DIAGNOSIS — IMO0002 Reserved for concepts with insufficient information to code with codable children: Secondary | ICD-10-CM

## 2015-02-23 MED ORDER — CANAGLIFLOZIN 300 MG PO TABS: ORAL_TABLET | ORAL | Status: DC

## 2015-02-23 NOTE — Progress Notes (Signed)
Patient ID: Sara Hobbs, female   DOB: 1961-06-10, 54 y.o.   MRN: [id number]           Reason for Appointment: follow-up for Type 2 Diabetes  Referring physician: Cloward  History of Present Illness:          Date of diagnosis of type 2 diabetes mellitus :        Background history:  She thinks she was treated with metformin for the first few years Although she says she was having some diarrhea with metformin she continued to take it but was probably taking 500 mg twice a day; not clear if she was taking the extended release her not She does not know if she took her medications for her diabetes with metformin At some point metformin was stopped because of kidney function abnormality and insulin started, probably in 2002 She thinks she has been on Lantus insulin most of the time Her blood sugar control has been consistently poor and records from PCP indicate that A1c has been at least 9% 10 as high as 13% since 2013  Recent history:   INSULIN regimen is described as: 50 units twice a day of Lantus     On her initial consultation her blood sugars had started dropping significantly about 10 days prior to her visit.  For this reason her Lantus was reduced from 70 down to 50 units twice a day. She had also been on Invokana for some time but had been having difficulty with recurrent vaginal candidiasis with this She was also given metformin ER to try for her insulin resistance syndrome and she has been able to tolerate 1000 mg a day in the evenings although has a little nausea with this.  Current blood sugar patterns and problems identified:   She was advised to bring her blood sugar monitor but she has not done so today  She is reporting near-normal blood sugars in the mornings although these appear to be fluctuating and only occasionally low normal range.  Highest blood sugar was 142 this morning after eating sweets last night  She has not done any readings after meals but mostly  before her afternoon meal which are mildly increased at times.  She has been more consistent with taking her Lantus in the evenings lately  She has not been able to do any activity  She had just seen the nurse educator today and has not made many changes to the diet.  She thinks she has cut back on drinking sweet drinks but still does that at least once a day  She stopped taking her Invokana 2 weeks ago as she ran out and has not had any further urinary candidiasis.  Oral hypoglycemic drugs the patient is taking are: metformin ER 1000 mg in evening Side effects from medications have been: Diarrhea from regular metformin, candidiasis from Invokana  Compliance with the medical regimen: Fair Hypoglycemia:   Glucose monitoring:  done usually 1-2  times a day         Glucometer: One Touch.      Blood Glucose readings by   Mean values apply above for all meters except median for One Touch  PRE-MEAL Fasting Lunch 3 pm Bedtime Overall  Glucose range: 70-110  120-160    Mean/median:         Self-care: The diet that the patient has been following is: tries to limit fat fats    Meal times: Breakfast: Variable Lunch: 12-3 pm  Dinner: 6 pm  Typical meal intake: Breakfast is applesauce or burritos.  Will have a sandwich for lunch and meat and vegetables for dinner.  Has applesauce or chips for snacks.  Will drink sweet tea 1x daily              Dietician visit, most recent:               Exercise:  unable to do any  Weight history: previously maximum weight was 260  Wt Readings from Last 3 Encounters:  02/23/15 212 lb (96.163 kg)  02/07/15 210 lb (95.255 kg)  01/26/15 210 lb 3.2 oz (95.346 kg)    Glycemic control:   Lab Results  Component Value Date   HGBA1C 9.1* 01/26/2015   HGBA1C 10.0* 11/23/2014   HGBA1C 11.3* 09/20/2014   Lab Results  Component Value Date   MICROALBUR <0.7 01/26/2015   CREATININE 0.90 01/26/2015         Medication List       This list is accurate  as of: 02/23/15  9:20 PM.  Always use your most recent med list.               albuterol 108 (90 BASE) MCG/ACT inhaler  Commonly known as:  PROVENTIL HFA;VENTOLIN HFA  Inhale 2 puffs into the lungs every 6 (six) hours as needed for wheezing or shortness of breath.     aspirin EC 81 MG tablet  Take 81 mg by mouth daily.     canagliflozin 300 MG Tabs tablet  Commonly known as:  INVOKANA  Take 1/2 tablet once a day     carvedilol 3.125 MG tablet  Commonly known as:  COREG  Take 1 tablet (3.125 mg total) by mouth 2 (two) times daily with a meal.     fenofibrate 145 MG tablet  Commonly known as:  TRICOR  Take 145 mg by mouth daily.     Fluticasone-Salmeterol 100-50 MCG/DOSE Aepb  Commonly known as:  ADVAIR  Inhale 1 puff into the lungs 2 (two) times daily.     glucose blood test strip  Commonly known as:  ONE TOUCH ULTRA TEST  Use as instructed to check blood sugar 3 times per day dx code E11.8     insulin glargine 100 UNIT/ML injection  Commonly known as:  LANTUS  Inject into the skin 2 (two) times daily. Inject 50 units in am and 40 units in pm     Insulin Pen Needle 31G X 8 MM Misc  by Does not apply route. USE AS DIRECTED     lisinopril 2.5 MG tablet  Commonly known as:  PRINIVIL,ZESTRIL  Take 1 tablet (2.5 mg total) by mouth daily.     metFORMIN 500 MG 24 hr tablet  Commonly known as:  GLUCOPHAGE-XR  2 tablets at supper     morphine 15 MG tablet  Commonly known as:  MSIR  Take 15 mg by mouth every 12 (twelve) hours.     Oxycodone HCl 10 MG Tabs  Take 10 mg by mouth every 8 (eight) hours as needed.     promethazine 25 MG tablet  Commonly known as:  PHENERGAN  Take 1 tablet (25 mg total) by mouth every 8 (eight) hours as needed for nausea.     ticagrelor 90 MG Tabs tablet  Commonly known as:  BRILINTA  Take by mouth 2 (two) times daily.     tiotropium 18 MCG inhalation capsule  Commonly known as:  SPIRIVA  Place 18 mcg into  inhaler and inhale daily.         Allergies:  Allergies  Allergen Reactions  . Wellbutrin [Bupropion] Other (See Comments)    SEVERE CHEST PAIN    Past Medical History  Diagnosis Date  . Chronic shoulder pain     "right; I've got bad rotator cuff"  . COPD (chronic obstructive pulmonary disease)   . CAD, multiple vessel   . Hyperlipidemia   . GERD (gastroesophageal reflux disease)   . Chest tightness   . Angina pectoris   . Wears glasses   . Anxiety   . Depression   . History of hiatal hernia   . 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 Myocardial infarction 11/2013    PCI w/ drug eluting stent LCx  . Asthma   . Hypothyroidism     PMH:  . IDDM (insulin dependent diabetes mellitus)   . Diabetes mellitus with complication   . Neuropathy due to secondary diabetes   . History of blood transfusion     "when I had my 1st youngun; when I had MI"  . History of stomach ulcers   . Headache     "monthly" (11/22/2014)  . Arthritis     "hands, neck, back, knees, hips, ankles" (11/22/2014)  . Chronic lower back pain   . Incidental lung nodule, > 42mm and < 47mm 11/22/2014    Small non-calcified nodules left lung    Past Surgical History  Procedure Laterality Date  . Cesarean section  1982; 1984  . Gastric resection  1990's    bezoar; "for reflux"  . Coronary angioplasty with stent placement  11/11/2013    Drug-eluting stent in LCx  . Breast surgery Left 1990's    breast duct surgery  . Colonoscopy    . Esophagogastroduodenoscopy    . Coronary artery bypass graft N/A 09/22/2014    Procedure: CORONARY ARTERY BYPASS GRAFTING (CABG);  Surgeon: Purcell Nails, MD;  Location: [facility] OR;  Service: Open Heart Surgery;  Laterality: N/A;  Times 2 using left internal mammary artery and endoscopically harvested right saphenous vein  . Tee without cardioversion N/A 09/22/2014    Procedure: TRANSESOPHAGEAL ECHOCARDIOGRAM (TEE);  Surgeon: Purcell Nails, MD;  Location: [facility] OR;  Service: Open  Heart Surgery;  Laterality: N/A;  . Laparoscopic cholecystectomy  2000  . Abdominal hysterectomy  1990's  . Cardiac catheterization  09/01/14  . Sternal wound debridement N/A 11/23/2014    Procedure: SUPERFICIAL STERNAL WOUND DEBRIDEMENT;  Surgeon: Purcell Nails, MD;  Location: [facility] OR;  Service: Thoracic;  Laterality: N/A;  . Application of wound vac N/A 11/23/2014    Procedure: POSSIBLE APPLICATION OF WOUND VAC;  Surgeon: Purcell Nails, MD;  Location: [facility] OR;  Service: Thoracic;  Laterality: N/A;    Family History  Problem Relation Age of Onset  . Adopted: Yes  . Family history unknown: Yes    Social History:  reports that she has been smoking Cigarettes.  She has a 82 pack-year smoking history. She quit smokeless tobacco use about 9 years ago. Her smokeless tobacco use included Chew. She reports that she does not drink alcohol or use illicit drugs.    Review of Systems    Lipid history: Has had markedly increased triglycerides, last level was 405 from PCP, LDL has been below 100 in 1173.  Not clear this is being managed by cardiologist in [facility]   No results  found for: CHOL, HDL, LDLCALC, LDLDIRECT, TRIG, CHOLHDL           Has long-standing history of numbness, burning, pains or tingling in feet and lower legs.  Symptoms may occur during the daytime.  Has not been on medications for this Diabetic foot exam in 5/16 showed the following: Monofilament sensation markedly decreased in the distal toes  She continues to have problems with wound infection of her sternum incision   Physical Examination:  BP 114/64 mmHg  Pulse 49  Temp(Src) 98 F (36.7 C)  Resp 18  Ht 5\' 4"  (1.626 m)  Wt 212 lb (96.163 kg)  BMI 36.37 kg/m2  SpO2 97%       ASSESSMENT/PLAN:  Diabetes type 2, uncontrolled       The patient has had overall excellent blood sugars in the last month or so, previously taking as much as 140 units of insulin with poor control With reducing the Lantus to 50  units twice a day her blood sugars are reportedly near-normal although her glucose monitor was not available for confirmation today. Her blood sugars in the morning maybe occasionally low normal; also may be better compliant with her evening Lantus  Blood sugars do not appear to be higher with stopping her Invokana which she did not refill and since she was having candidiasis with this she can leave this off She is tolerating metformin 1000 mg a day and will continue.  She has mild nausea but she wants to continue this; suggested taking the 2 tablet separately but she wants to take both together She will also need to be making changes in her meal planning after discussion with nurse educator today She does need to lose weight  Currently she is not able to exercise  Her only change today would be to reduce her evening insulin by 5 units for now  Reminded her to check her blood sugars after meals and bring monitor for download on each visit  Will need to evaluate her postprandial readings on her next visit  Will check fructosamine today to assess her level of control  Patient Instructions  Check blood sugars on waking up ..3-4. times a week Also check blood sugars about 2 hours after a meal and do this after different meals by rotation  Recommended blood sugar levels on waking up is 90-130 and about 2 hours after meal is 140-180 Please bring blood sugar monitor to each visit.  Leave off Invokana  Lantus 45 in pm  If am sugar stays < 90 reduce pm Lantus to 40    Counseling time on subjects discussed above is over 50% of today's 25 minute visit   Purnell Daigle 02/23/2015, 9:20 PM   Note: This office note was prepared with Insurance underwriter. Any transcriptional errors that result from this process are unintentional.

## 2015-02-23 NOTE — Patient Instructions (Addendum)
Check blood sugars on waking up ..3-4. times a week Also check blood sugars about 2 hours after a meal and do this after different meals by rotation  Recommended blood sugar levels on waking up is 90-130 and about 2 hours after meal is 140-180 Please bring blood sugar monitor to each visit.  Leave off Invokana  Lantus 45 in pm  If am sugar stays < 90 reduce pm Lantus to 40

## 2015-02-23 NOTE — Progress Notes (Signed)
Patient is here with her husband.  She did not bring her meter.  Says blood sugars are "so much better since seeing Dr. Lucianne Muss".   SBGM: She says her FBSs are 70s -140 this morning due to eating 5 small snack size chocolate bars at bedtime. Pt. Reports being symptomatic at 70s--nervous and weak.   Lunch: 120-150, acS: 130-180s Typical day: 6AM wakes up. 7AM Takes Lantus 50u  8AM      Breakfast:  Something from supper the night before.  Today is was a beef burrito and water to drink 12-1PM: Lunch:  Today 2 chicken breasts from [facility] and sweet tea (16 ounces), sometimes will eat nothing 6-7PM: Supper: Meat (3-5 ounces, with green veg., and a starchy veg., and sweet tea 9PM:  5-6 Dorrito chips, lantus 50u 9:30-10PM: bed   Says is not giving up her sweet tea.  Is concerned that her surgery is not healing as quickly as her doctor would like.  I told her that she will heal faster if her blood sugars come down, and strongly suggested she test her blood sugars at HS and if over 120, to stop the sweet tea.  She agreed to do this. Plan: Test blood sugars at HS (9:30PM).  If blood sugar is over 120, stop sweet tea. Per Dr. Remus Blake order, reduce PM dose of Lantus to 40u

## 2015-02-23 NOTE — Patient Instructions (Signed)
Test blood sugars at HS (9:30PM).  If blood sugar is over 120, stop sweet tea.  Reduce PM dose of Lantus to 40u

## 2015-02-24 LAB — BASIC METABOLIC PANEL
BUN: 18 mg/dL (ref 6–23)
CO2: 23 mEq/L (ref 19–32)
Calcium: 9.3 mg/dL (ref 8.4–10.5)
Chloride: 103 mEq/L (ref 96–112)
Creatinine, Ser: 1.02 mg/dL (ref 0.40–1.20)
GFR: 60.14 mL/min (ref >60.00)
Glucose, Bld: 125 mg/dL — ABNORMAL HIGH (ref 70–99)
Potassium: 4.1 mEq/L (ref 3.5–5.1)
Sodium: 135 mEq/L (ref 135–145)

## 2015-02-25 LAB — FRUCTOSAMINE: FRUCTOSAMINE: 233 umol/L (ref 0–285)

## 2015-03-01 ENCOUNTER — Telehealth: Payer: Self-pay

## 2015-03-01 NOTE — Telephone Encounter (Signed)
-----   Message from Purcell Nails, MD sent at 03/01/2015  2:32 PM EDT ----- Regarding: RE: wound vac./dressing type Contact: [phone] Saline-moistened 2x2 guaze at least once/day  ----- Message -----    From: Joycelyn Schmid, LPN    Sent: 4/56/2563  10:49 AM      To: Purcell Nails, MD Subject: wound vac./dressing type                       Tiffany from [facility] called to report that MS Pacha's wound is 1.5 cm now and she is going to discontinue her wound vac. She is requesting what type of dressing would you like her to apply to the wound and how often should it be changed. Patient is scheduled to see you on July 18./ please advise SW

## 2015-03-01 NOTE — Telephone Encounter (Signed)
Home health nurse notified of the change

## 2015-03-15 ENCOUNTER — Other Ambulatory Visit: Payer: Self-pay | Admitting: Physician Assistant

## 2015-03-21 ENCOUNTER — Ambulatory Visit: Payer: BLUE CROSS/BLUE SHIELD | Admitting: Thoracic Surgery (Cardiothoracic Vascular Surgery)

## 2015-03-25 ENCOUNTER — Ambulatory Visit: Payer: BLUE CROSS/BLUE SHIELD | Admitting: Thoracic Surgery (Cardiothoracic Vascular Surgery)

## 2015-03-28 ENCOUNTER — Ambulatory Visit (INDEPENDENT_AMBULATORY_CARE_PROVIDER_SITE_OTHER): Payer: BLUE CROSS/BLUE SHIELD | Admitting: Physician Assistant

## 2015-03-28 VITALS — BP 148/78 | HR 46 | Resp 20 | Ht 63.0 in | Wt 212.0 lb

## 2015-03-28 DIAGNOSIS — Z951 Presence of aortocoronary bypass graft: Secondary | ICD-10-CM

## 2015-03-28 DIAGNOSIS — R911 Solitary pulmonary nodule: Secondary | ICD-10-CM | POA: Diagnosis not present

## 2015-03-28 DIAGNOSIS — L0390 Cellulitis, unspecified: Secondary | ICD-10-CM

## 2015-03-28 DIAGNOSIS — I2510 Atherosclerotic heart disease of native coronary artery without angina pectoris: Secondary | ICD-10-CM

## 2015-03-28 NOTE — Progress Notes (Signed)
  HPI:  Patient returns for wound check.  The patient is S/P CABG performed 09/22/2014.  The patient initially did well.  However in March she presented with fat necrosis and a superficial wound infection of her sternotomy.  She was admitted to the hospital and underwent debridement with wound vac placement.  She was last seen by Dr. Cornelius Moras on 02/07/2015 at which time it was recommended wound vac therapy be continued.  She states her wound has completely healed.  She is very happy about this.  She states since she saw that other doctor for her blood sugars the wound healed so much better.  She continues to smoke and was again counseled on the importance of smoking cessation.  She states she would try to quit starting next month.    Current Outpatient Prescriptions  Medication Sig Dispense Refill  . albuterol (PROVENTIL HFA;VENTOLIN HFA) 108 (90 BASE) MCG/ACT inhaler Inhale 2 puffs into the lungs every 6 (six) hours as needed for wheezing or shortness of breath.    Marland Kitchen aspirin EC 81 MG tablet Take 81 mg by mouth daily.    . canagliflozin (INVOKANA) 300 MG TABS tablet Take 1/2 tablet once a day 15 tablet 3  . carvedilol (COREG) 3.125 MG tablet Take 1 tablet (3.125 mg total) by mouth 2 (two) times daily with a meal. 60 tablet 1  . fenofibrate (TRICOR) 145 MG tablet Take 145 mg by mouth daily.    . Fluticasone-Salmeterol (ADVAIR) 100-50 MCG/DOSE AEPB Inhale 1 puff into the lungs 2 (two) times daily.    Marland Kitchen glucose blood (ONE TOUCH ULTRA TEST) test strip Use as instructed to check blood sugar 3 times per day dx code E11.8 100 each 3  . insulin glargine (LANTUS) 100 UNIT/ML injection Inject into the skin 2 (two) times daily. Inject 50 units in am and 40 units in pm    . Insulin Pen Needle 31G X 8 MM MISC by Does not apply route. USE AS DIRECTED    . lisinopril (PRINIVIL,ZESTRIL) 2.5 MG tablet Take 1 tablet (2.5 mg total) by mouth daily. 30 tablet 3  . metFORMIN (GLUCOPHAGE-XR) 500 MG 24 hr tablet 2 tablets at  supper 60 tablet 3  . morphine (MSIR) 15 MG tablet Take 15 mg by mouth every 12 (twelve) hours.    . Oxycodone HCl 10 MG TABS Take 10 mg by mouth every 8 (eight) hours as needed.    . promethazine (PHENERGAN) 25 MG tablet Take 1 tablet (25 mg total) by mouth every 8 (eight) hours as needed for nausea. 30 tablet 0  . ticagrelor (BRILINTA) 90 MG TABS tablet Take by mouth 2 (two) times daily.    Marland Kitchen tiotropium (SPIRIVA) 18 MCG inhalation capsule Place 18 mcg into inhaler and inhale daily.     No current facility-administered medications for this visit.    Physical Exam:  BP 148/78 mmHg  Pulse 46  Resp 20  Ht 5\' 3"  (1.6 m)  Wt 212 lb (96.163 kg)  BMI 37.56 kg/m2  SpO2 97%  Gen: no apparent distress Heart; RRR Lungs; CTA bilaterally Sternum: well healed, no evidence of infection  A/P:  1. Sternal Wound- resolved, sternotomy is closed with no acute signs of infection 2. DM- continue to improve sugar control, diet modification per Dr. Lucianne Muss 3. Nicotine Abuse- again counseled on importance of cessation 4. Dispo- RTC prn   Lowella Dandy, PA-C [facility] [phone]

## 2015-03-31 ENCOUNTER — Other Ambulatory Visit: Payer: Self-pay | Admitting: Physician Assistant

## 2015-04-20 ENCOUNTER — Other Ambulatory Visit (INDEPENDENT_AMBULATORY_CARE_PROVIDER_SITE_OTHER): Payer: BLUE CROSS/BLUE SHIELD

## 2015-04-20 DIAGNOSIS — E785 Hyperlipidemia, unspecified: Secondary | ICD-10-CM

## 2015-04-20 DIAGNOSIS — E1165 Type 2 diabetes mellitus with hyperglycemia: Secondary | ICD-10-CM | POA: Diagnosis not present

## 2015-04-20 DIAGNOSIS — IMO0002 Reserved for concepts with insufficient information to code with codable children: Secondary | ICD-10-CM

## 2015-04-20 LAB — COMPREHENSIVE METABOLIC PANEL
ALK PHOS: 97 U/L (ref 39–117)
ALT: 74 U/L — AB (ref 0–35)
AST: 36 U/L (ref 0–37)
Albumin: 4.3 g/dL (ref 3.5–5.2)
BILIRUBIN TOTAL: 0.4 mg/dL (ref 0.2–1.2)
BUN: 12 mg/dL (ref 6–23)
CALCIUM: 10.3 mg/dL (ref 8.4–10.5)
CO2: 28 meq/L (ref 19–32)
Chloride: 97 mEq/L (ref 96–112)
Creatinine, Ser: 0.8 mg/dL (ref 0.40–1.20)
GFR: 79.55 mL/min (ref >60.00)
Glucose, Bld: 255 mg/dL — ABNORMAL HIGH (ref 70–99)
POTASSIUM: 4.1 meq/L (ref 3.5–5.1)
Sodium: 133 mEq/L — ABNORMAL LOW (ref 135–145)
Total Protein: 7 g/dL (ref 6.0–8.3)

## 2015-04-20 LAB — LDL CHOLESTEROL, DIRECT: Direct LDL: 152 mg/dL

## 2015-04-20 LAB — HEMOGLOBIN A1C: HEMOGLOBIN A1C: 6.9 % — AB (ref 4.6–6.5)

## 2015-04-20 LAB — LIPID PANEL
CHOL/HDL RATIO: 8
Cholesterol: 235 mg/dL — ABNORMAL HIGH (ref 0–200)
HDL: 30.1 mg/dL — AB (ref >39.00)

## 2015-04-25 ENCOUNTER — Encounter: Payer: Self-pay | Admitting: Endocrinology

## 2015-04-25 ENCOUNTER — Ambulatory Visit (INDEPENDENT_AMBULATORY_CARE_PROVIDER_SITE_OTHER): Payer: BLUE CROSS/BLUE SHIELD | Admitting: Endocrinology

## 2015-04-25 VITALS — BP 142/84 | HR 82 | Temp 98.2°F | Wt 204.0 lb

## 2015-04-25 DIAGNOSIS — E785 Hyperlipidemia, unspecified: Secondary | ICD-10-CM | POA: Diagnosis not present

## 2015-04-25 DIAGNOSIS — IMO0002 Reserved for concepts with insufficient information to code with codable children: Secondary | ICD-10-CM

## 2015-04-25 DIAGNOSIS — E1165 Type 2 diabetes mellitus with hyperglycemia: Secondary | ICD-10-CM | POA: Diagnosis not present

## 2015-04-25 MED ORDER — INSULIN LISPRO 100 UNIT/ML (KWIKPEN): PEN_INJECTOR | SUBCUTANEOUS | Status: DC

## 2015-04-25 NOTE — Progress Notes (Signed)
Patient ID: Sara Hobbs, female   DOB: 18-Mar-1961, 54 y.o.   MRN: [id number]           Reason for Appointment: follow-up for Type 2 Diabetes  Referring physician: Cloward  History of Present Illness:          Date of diagnosis of type 2 diabetes mellitus :        Background history:  She thinks she was treated with metformin for the first few years Although she says she was having some diarrhea with metformin she continued to take it but was probably taking 500 mg twice a day; not clear if she was taking the extended release her not She does not know if she took her medications for her diabetes with metformin At some point metformin was stopped because of kidney function abnormality and insulin started, probably in 2002 She thinks she has been on Lantus insulin most of the time Her blood sugar control has been consistently poor and records from PCP indicate that A1c has been at least 9% 10 as high as 13% since 2013  Recent history:   INSULIN regimen is described as: 50 units--45 Lantus     On her initial consultation her blood sugars had started dropping significantly about 10 days prior to her visit.  For this reason her Lantus was reduced from 70 down to 50 units twice a day.  She had also been on Invokana for some time but had been having difficulty with recurrent vaginal candidiasis with this; this was stopped and blood sugars do not appear to be any higher Her A1c is now below 7% which is much better than before  Current blood sugar patterns and problems identified:   She has checked her blood sugars somewhat irregularly and mostly in the morning hours; has only one reading in the last 2 weeks  Her lab glucose was well over 200 but she thinks she may have had some sweet tea  Also had one reading after supper which was over 200; she thinks she is generally drinking sweet tea at suppertime also  She was advised to reduce her Lantus on her last visit in the evening since she  was having some low normal morning readings  No hypoglycemia currently  She is tolerating metformin ER 1000 mg daily although has had nausea from other reasons  Oral hypoglycemic drugs the patient is taking are: metformin ER 1000 mg in evening Side effects from medications have been: Diarrhea from regular metformin, candidiasis from Invokana  Compliance with the medical regimen: Fair Hypoglycemia: None  Glucose monitoring:  done usually 1-2  times a day         Glucometer: One Touch.      Blood Glucose readings by   Mean values apply above for all meters except median for One Touch  PRE-MEAL Fasting Lunch Dinner Bedtime Overall  Glucose range:  115-190   107, 199   121   225    Mean/median:  156      168      Self-care: The diet that the patient has been following is: tries to limit fat fats    Meal times: Breakfast: Variable Lunch: 12-3 pm  Dinner: 6 pm  Typical meal intake: Breakfast is applesauce or burritos.  Will have a sandwich for lunch and meat and vegetables for dinner.  Has applesauce or chips for snacks.  Will drink sweet tea 1x daily at supper  Dietician visit, most recent:               Exercise:  unable to do any  Weight history: previously maximum weight was 260  Wt Readings from Last 3 Encounters:  04/25/15 204 lb (92.534 kg)  03/28/15 212 lb (96.163 kg)  02/23/15 212 lb (96.163 kg)    Glycemic control:   Lab Results  Component Value Date   HGBA1C 6.9* 04/20/2015   HGBA1C 9.1* 01/26/2015   HGBA1C 10.0* 11/23/2014   Lab Results  Component Value Date   MICROALBUR <0.7 01/26/2015   CREATININE 0.80 04/20/2015         Medication List       This list is accurate as of: 04/25/15 11:59 PM.  Always use your most recent med list.               albuterol 108 (90 BASE) MCG/ACT inhaler  Commonly known as:  PROVENTIL HFA;VENTOLIN HFA  Inhale 2 puffs into the lungs every 6 (six) hours as needed for wheezing or shortness of breath.      aspirin EC 81 MG tablet  Take 81 mg by mouth daily.     carvedilol 3.125 MG tablet  Commonly known as:  COREG  Take 1 tablet (3.125 mg total) by mouth 2 (two) times daily with a meal.     fenofibrate 145 MG tablet  Commonly known as:  TRICOR  Take 145 mg by mouth daily.     Fluticasone-Salmeterol 100-50 MCG/DOSE Aepb  Commonly known as:  ADVAIR  Inhale 1 puff into the lungs 2 (two) times daily.     glucose blood test strip  Commonly known as:  ONE TOUCH ULTRA TEST  Use as instructed to check blood sugar 3 times per day dx code E11.8     insulin glargine 100 UNIT/ML injection  Commonly known as:  LANTUS  Inject into the skin 2 (two) times daily. Inject 50 units in am and 40 units in pm     insulin lispro 100 UNIT/ML KiwkPen  Commonly known as:  HUMALOG KWIKPEN  6-8 units before supper or large meals     Insulin Pen Needle 31G X 8 MM Misc  by Does not apply route. USE AS DIRECTED     lisinopril 2.5 MG tablet  Commonly known as:  PRINIVIL,ZESTRIL  Take 1 tablet (2.5 mg total) by mouth daily.     metFORMIN 500 MG 24 hr tablet  Commonly known as:  GLUCOPHAGE-XR  2 tablets at supper     morphine 15 MG tablet  Commonly known as:  MSIR  Take 15 mg by mouth every 12 (twelve) hours.     nitroGLYCERIN 0.4 MG SL tablet  Commonly known as:  NITROSTAT  Place 0.4 mg under the tongue.     Oxycodone HCl 10 MG Tabs  Take 10 mg by mouth every 8 (eight) hours as needed.     promethazine 25 MG tablet  Commonly known as:  PHENERGAN  Take 1 tablet (25 mg total) by mouth every 8 (eight) hours as needed for nausea.     ticagrelor 90 MG Tabs tablet  Commonly known as:  BRILINTA  Take by mouth 2 (two) times daily.     tiotropium 18 MCG inhalation capsule  Commonly known as:  SPIRIVA  Place 18 mcg into inhaler and inhale daily.        Allergies:  Allergies  Allergen Reactions  . Wellbutrin [Bupropion] Other (See Comments)  SEVERE CHEST PAIN    Past Medical History    Diagnosis Date  . Chronic shoulder pain     "right; I've got bad rotator cuff"  . COPD (chronic obstructive pulmonary disease)   . CAD, multiple vessel   . Hyperlipidemia   . GERD (gastroesophageal reflux disease)   . Chest tightness   . Angina pectoris   . Wears glasses   . Anxiety   . Depression   . History of hiatal hernia   . 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 Myocardial infarction 11/2013    PCI w/ drug eluting stent LCx  . Asthma   . Hypothyroidism     PMH:  . IDDM (insulin dependent diabetes mellitus)   . Diabetes mellitus with complication   . Neuropathy due to secondary diabetes   . History of blood transfusion     "when I had my 1st youngun; when I had MI"  . History of stomach ulcers   . Headache     "monthly" (11/22/2014)  . Arthritis     "hands, neck, back, knees, hips, ankles" (11/22/2014)  . Chronic lower back pain   . Incidental lung nodule, > 3mm and < 8mm 11/22/2014    Small non-calcified nodules left lung    Past Surgical History  Procedure Laterality Date  . Cesarean section  1982; 1984  . Gastric resection  1990's    bezoar; "for reflux"  . Coronary angioplasty with stent placement  11/11/2013    Drug-eluting stent in LCx  . Breast surgery Left 1990's    breast duct surgery  . Colonoscopy    . Esophagogastroduodenoscopy    . Coronary artery bypass graft N/A 09/22/2014    Procedure: CORONARY ARTERY BYPASS GRAFTING (CABG);  Surgeon: Purcell Nails, MD;  Location: [facility] OR;  Service: Open Heart Surgery;  Laterality: N/A;  Times 2 using left internal mammary artery and endoscopically harvested right saphenous vein  . Tee without cardioversion N/A 09/22/2014    Procedure: TRANSESOPHAGEAL ECHOCARDIOGRAM (TEE);  Surgeon: Purcell Nails, MD;  Location: [facility] OR;  Service: Open Heart Surgery;  Laterality: N/A;  . Laparoscopic cholecystectomy  2000  . Abdominal hysterectomy  1990's  . Cardiac catheterization  09/01/14  .  Sternal wound debridement N/A 11/23/2014    Procedure: SUPERFICIAL STERNAL WOUND DEBRIDEMENT;  Surgeon: Purcell Nails, MD;  Location: [facility] OR;  Service: Thoracic;  Laterality: N/A;  . Application of wound vac N/A 11/23/2014    Procedure: POSSIBLE APPLICATION OF WOUND VAC;  Surgeon: Purcell Nails, MD;  Location: [facility] OR;  Service: Thoracic;  Laterality: N/A;    Family History  Problem Relation Age of Onset  . Adopted: Yes  . Family history unknown: Yes    Social History:  reports that she has been smoking Cigarettes.  She has a 82 pack-year smoking history. She quit smokeless tobacco use about 9 years ago. Her smokeless tobacco use included Chew. She reports that she does not drink alcohol or use illicit drugs.    Review of Systems    Lipid history: Has had markedly increased triglycerides, last level was 405 from PCP, LDL has been below 100 in 5784.  Not clear this is being managed by cardiologist in [facility]    Lab Results  Component Value Date   CHOL 235* 04/20/2015   HDL 30.10* 04/20/2015   LDLDIRECT 152.0 04/20/2015   TRIG * 04/20/2015  482.0 Triglyceride is over 400; calculations on Lipids are invalid.   CHOLHDL 8 04/20/2015             Has long-standing history of numbness, burning, pains or tingling in feet and lower legs.  Symptoms may occur during the daytime.  Has not been on medications for this Diabetic foot exam in 5/16 showed the following: Monofilament sensation markedly decreased in the distal toes    Physical Examination:  BP 142/84 mmHg  Pulse 82  Temp(Src) 98.2 F (36.8 C) (Oral)  Wt 204 lb (92.534 kg)  SpO2 98%       ASSESSMENT/PLAN:  Diabetes type 2, uncontrolled       The patient has had overall fairly good blood sugars although has had some fluctuation and relatively high readings at times in the mornings Also has postprandial hyperglycemia especially when she goes off her diet and keeps drinking sweet tea especially in the evenings Very  few blood sugars checked nonfasting and only once after supper recently She has seen the nurse educator but does not appear to be very motivated to make the changes or even check her blood sugar regularly especially after meals She does need to lose weight  Currently she is not able to exercise  Since she will not change her meal pattern and continue to drink sweet tea at suppertime will start her on Novolog 6-8 units This way she will also be more likely to check her blood sugars after supper to help adjust the dose Discussed action profile of Novolog, timing of injection, adjustment based on meal size and postprandial readings She will also take this at lunch if eating out or planning to have a sweet drink  Instructions for day-to-day care as follows:  Patient Instructions  Check blood sugars on waking up .Marland Kitchen 3-4 .Marland Kitchen times a week Also check blood sugars about 2 hours after a meal and do this after different meals by rotation  Recommended blood sugar levels on waking up is 90-130 and about 2 hours after meal is 140-180 Please bring blood sugar monitor to each visit.  Novolog 6-8 to cover pm meal and keep sugar in range above May reduce pm Lantus if am sugar stays<90  Counseling time on subjects discussed above is over 50% of today's 25 minute visit   Lashawna Poche 04/26/2015, 1:18 PM   Note: This office note was prepared with Insurance underwriter. Any transcriptional errors that result from this process are unintentional.

## 2015-04-25 NOTE — Patient Instructions (Addendum)
Check blood sugars on waking up .Marland Kitchen 3-4 .Marland Kitchen times a week Also check blood sugars about 2 hours after a meal and do this after different meals by rotation  Recommended blood sugar levels on waking up is 90-130 and about 2 hours after meal is 140-180 Please bring blood sugar monitor to each visit.  Novolog 6-8 to cover pm meal and keep sugar in range above May reduce pm Lantus if am sugar stays<90

## 2015-04-25 NOTE — Progress Notes (Signed)
Pre visit review using our clinic review tool, if applicable. No additional management support is needed unless otherwise documented below in the visit note. 

## 2015-04-26 ENCOUNTER — Other Ambulatory Visit: Payer: Self-pay | Admitting: *Deleted

## 2015-04-26 MED ORDER — INSULIN ASPART 100 UNIT/ML FLEXPEN: PEN_INJECTOR | SUBCUTANEOUS | Status: DC

## 2015-06-01 ENCOUNTER — Emergency Department (HOSPITAL_COMMUNITY)
Admission: EM | Admit: 2015-06-01 | Discharge: 2015-06-01 | Disposition: A | Discharge status: Home / Self Care | Payer: BLUE CROSS/BLUE SHIELD | Attending: Emergency Medicine | Admitting: Emergency Medicine

## 2015-06-01 ENCOUNTER — Encounter (HOSPITAL_COMMUNITY): Payer: Self-pay | Admitting: *Deleted

## 2015-06-01 ENCOUNTER — Emergency Department (HOSPITAL_COMMUNITY): Payer: BLUE CROSS/BLUE SHIELD

## 2015-06-01 DIAGNOSIS — M545 Low back pain: Secondary | ICD-10-CM | POA: Diagnosis not present

## 2015-06-01 DIAGNOSIS — M19071 Primary osteoarthritis, right ankle and foot: Secondary | ICD-10-CM | POA: Insufficient documentation

## 2015-06-01 DIAGNOSIS — M19072 Primary osteoarthritis, left ankle and foot: Secondary | ICD-10-CM | POA: Insufficient documentation

## 2015-06-01 DIAGNOSIS — Z9861 Coronary angioplasty status: Secondary | ICD-10-CM | POA: Insufficient documentation

## 2015-06-01 DIAGNOSIS — M47819 Spondylosis without myelopathy or radiculopathy, site unspecified: Secondary | ICD-10-CM | POA: Diagnosis not present

## 2015-06-01 DIAGNOSIS — Z8719 Personal history of other diseases of the digestive system: Secondary | ICD-10-CM | POA: Diagnosis not present

## 2015-06-01 DIAGNOSIS — M19042 Primary osteoarthritis, left hand: Secondary | ICD-10-CM | POA: Diagnosis not present

## 2015-06-01 DIAGNOSIS — M1611 Unilateral primary osteoarthritis, right hip: Secondary | ICD-10-CM | POA: Insufficient documentation

## 2015-06-01 DIAGNOSIS — M19041 Primary osteoarthritis, right hand: Secondary | ICD-10-CM | POA: Diagnosis not present

## 2015-06-01 DIAGNOSIS — M1612 Unilateral primary osteoarthritis, left hip: Secondary | ICD-10-CM | POA: Insufficient documentation

## 2015-06-01 DIAGNOSIS — R630 Anorexia: Secondary | ICD-10-CM | POA: Diagnosis not present

## 2015-06-01 DIAGNOSIS — Z794 Long term (current) use of insulin: Secondary | ICD-10-CM | POA: Diagnosis not present

## 2015-06-01 DIAGNOSIS — M1712 Unilateral primary osteoarthritis, left knee: Secondary | ICD-10-CM | POA: Insufficient documentation

## 2015-06-01 DIAGNOSIS — Z720 Tobacco use: Secondary | ICD-10-CM | POA: Diagnosis not present

## 2015-06-01 DIAGNOSIS — Z7951 Long term (current) use of inhaled steroids: Secondary | ICD-10-CM | POA: Insufficient documentation

## 2015-06-01 DIAGNOSIS — Z8639 Personal history of other endocrine, nutritional and metabolic disease: Secondary | ICD-10-CM | POA: Insufficient documentation

## 2015-06-01 DIAGNOSIS — Z79891 Long term (current) use of opiate analgesic: Secondary | ICD-10-CM | POA: Diagnosis not present

## 2015-06-01 DIAGNOSIS — I252 Old myocardial infarction: Secondary | ICD-10-CM | POA: Diagnosis not present

## 2015-06-01 DIAGNOSIS — I25119 Atherosclerotic heart disease of native coronary artery with unspecified angina pectoris: Secondary | ICD-10-CM | POA: Insufficient documentation

## 2015-06-01 DIAGNOSIS — J449 Chronic obstructive pulmonary disease, unspecified: Secondary | ICD-10-CM | POA: Diagnosis not present

## 2015-06-01 DIAGNOSIS — Z79899 Other long term (current) drug therapy: Secondary | ICD-10-CM | POA: Insufficient documentation

## 2015-06-01 DIAGNOSIS — M47812 Spondylosis without myelopathy or radiculopathy, cervical region: Secondary | ICD-10-CM | POA: Diagnosis not present

## 2015-06-01 DIAGNOSIS — Z8659 Personal history of other mental and behavioral disorders: Secondary | ICD-10-CM | POA: Insufficient documentation

## 2015-06-01 DIAGNOSIS — R531 Weakness: Secondary | ICD-10-CM | POA: Insufficient documentation

## 2015-06-01 DIAGNOSIS — M1711 Unilateral primary osteoarthritis, right knee: Secondary | ICD-10-CM | POA: Insufficient documentation

## 2015-06-01 DIAGNOSIS — R197 Diarrhea, unspecified: Secondary | ICD-10-CM

## 2015-06-01 DIAGNOSIS — G8929 Other chronic pain: Secondary | ICD-10-CM | POA: Insufficient documentation

## 2015-06-01 DIAGNOSIS — Z7982 Long term (current) use of aspirin: Secondary | ICD-10-CM | POA: Insufficient documentation

## 2015-06-01 DIAGNOSIS — Z9889 Other specified postprocedural states: Secondary | ICD-10-CM | POA: Insufficient documentation

## 2015-06-01 DIAGNOSIS — Z951 Presence of aortocoronary bypass graft: Secondary | ICD-10-CM | POA: Diagnosis not present

## 2015-06-01 DIAGNOSIS — R112 Nausea with vomiting, unspecified: Secondary | ICD-10-CM | POA: Insufficient documentation

## 2015-06-01 LAB — COMPREHENSIVE METABOLIC PANEL
ALBUMIN: 4.1 g/dL (ref 3.5–5.0)
ALK PHOS: 119 U/L (ref 38–126)
ALT: 61 U/L — AB (ref 14–54)
ANION GAP: 12 (ref 5–15)
AST: 31 U/L (ref 15–41)
BUN: 10 mg/dL (ref 6–20)
CALCIUM: 10 mg/dL (ref 8.9–10.3)
CO2: 22 mmol/L (ref 22–32)
Chloride: 99 mmol/L — ABNORMAL LOW (ref 101–111)
Creatinine, Ser: 0.85 mg/dL (ref 0.44–1.00)
GFR calc Af Amer: >60 mL/min (ref >60)
GFR calc non Af Amer: >60 mL/min (ref >60)
GLUCOSE: 364 mg/dL — AB (ref 65–99)
Potassium: 3.5 mmol/L (ref 3.5–5.1)
SODIUM: 133 mmol/L — AB (ref 135–145)
Total Bilirubin: 0.7 mg/dL (ref 0.3–1.2)
Total Protein: 7.7 g/dL (ref 6.5–8.1)

## 2015-06-01 LAB — URINE MICROSCOPIC-ADD ON

## 2015-06-01 LAB — CBC
HCT: 48.4 % — ABNORMAL HIGH (ref 36.0–46.0)
HEMOGLOBIN: 17.3 g/dL — AB (ref 12.0–15.0)
MCH: 30.8 pg (ref 26.0–34.0)
MCHC: 35.7 g/dL (ref 30.0–36.0)
MCV: 86.3 fL (ref 78.0–100.0)
Platelets: 275 10*3/uL (ref 150–400)
RBC: 5.61 MIL/uL — ABNORMAL HIGH (ref 3.87–5.11)
RDW: 12 % (ref 11.5–15.5)
WBC: 16.3 10*3/uL — ABNORMAL HIGH (ref 4.0–10.5)

## 2015-06-01 LAB — URINALYSIS, ROUTINE W REFLEX MICROSCOPIC
Ketones, ur: 15 mg/dL — AB
Leukocytes, UA: NEGATIVE
Nitrite: NEGATIVE
PROTEIN: 30 mg/dL — AB
SPECIFIC GRAVITY, URINE: 1.035 — AB (ref 1.005–1.030)
Urobilinogen, UA: 0.2 mg/dL (ref 0.0–1.0)
pH: 5 (ref 5.0–8.0)

## 2015-06-01 LAB — I-STAT BETA HCG BLOOD, ED (MC, WL, AP ONLY): I-stat hCG, quantitative: <5 m[IU]/mL (ref <5)

## 2015-06-01 LAB — CBG MONITORING, ED: GLUCOSE-CAPILLARY: 305 mg/dL — AB (ref 65–99)

## 2015-06-01 LAB — I-STAT TROPONIN, ED: Troponin i, poc: 0.01 ng/mL (ref 0.00–0.08)

## 2015-06-01 LAB — LIPASE, BLOOD: Lipase: 37 U/L (ref 22–51)

## 2015-06-01 MED ORDER — SODIUM CHLORIDE 0.9 % IV BOLUS (SEPSIS)
1000.0000 mL | Freq: Once | INTRAVENOUS | Status: AC
  Administered 2015-06-01: 1000 mL via INTRAVENOUS

## 2015-06-01 MED ORDER — ONDANSETRON HCL 4 MG PO TABS: 4.0000 mg | ORAL_TABLET | Freq: Four times a day (QID) | ORAL | Status: DC

## 2015-06-01 MED ORDER — LOPERAMIDE HCL 2 MG PO CAPS: 2.0000 mg | ORAL_CAPSULE | Freq: Four times a day (QID) | ORAL | Status: DC | PRN

## 2015-06-01 MED ORDER — ONDANSETRON HCL 4 MG/2ML IJ SOLN
4.0000 mg | Freq: Once | INTRAMUSCULAR | Status: AC
  Administered 2015-06-01: 4 mg via INTRAVENOUS
  Filled 2015-06-01: qty 2

## 2015-06-01 MED ORDER — ONDANSETRON 4 MG PO TBDP: 4.0000 mg | ORAL_TABLET | Freq: Once | ORAL | Status: DC

## 2015-06-01 NOTE — Discharge Instructions (Signed)
Diarrhea °Diarrhea is watery poop (stool). It can make you feel weak, tired, thirsty, or give you a dry mouth (signs of dehydration). Watery poop is a sign of another problem, most often an infection. It often lasts 2-3 days. It can last longer if it is a sign of something serious. Take care of yourself as told by your doctor. °HOME CARE  °· Drink 1 cup (8 ounces) of fluid each time you have watery poop. °· Do not drink the following fluids: °¨ Those that contain simple sugars (fructose, glucose, galactose, lactose, sucrose, maltose). °¨ Sports drinks. °¨ Fruit juices. °¨ Whole milk products. °¨ Sodas. °¨ Drinks with caffeine (coffee, tea, soda) or alcohol. °· Oral rehydration solution may be used if the doctor says it is okay. You may make your own solution. Follow this recipe: °¨  - teaspoon table salt. °¨ ¾ teaspoon baking soda. °¨  teaspoon salt substitute containing potassium chloride. °¨ 1 tablespoons sugar. °¨ 1 liter (34 ounces) of water. °· Avoid the following foods: °¨ High fiber foods, such as raw fruits and vegetables. °¨ Nuts, seeds, and whole grain breads and cereals. °¨  Those that are sweetened with sugar alcohols (xylitol, sorbitol, mannitol). °· Try eating the following foods: °¨ Starchy foods, such as rice, toast, pasta, low-sugar cereal, oatmeal, baked potatoes, crackers, and bagels. °¨ Bananas. °¨ Applesauce. °· Eat probiotic-rich foods, such as yogurt and milk products that are fermented. °· Wash your hands well after each time you have watery poop. °· Only take medicine as told by your doctor. °· Take a warm bath to help lessen burning or pain from having watery poop. °GET HELP RIGHT AWAY IF:  °· You cannot drink fluids without throwing up (vomiting). °· You keep throwing up. °· You have blood in your poop, or your poop looks black and tarry. °· You do not pee (urinate) in 6-8 hours, or there is only a small amount of very dark pee. °· You have belly (abdominal) pain that gets worse or stays  in the same spot (localizes). °· You are weak, dizzy, confused, or light-headed. °· You have a very bad headache. °· Your watery poop gets worse or does not get better. °· You have a fever or lasting symptoms for more than 2-3 days. °· You have a fever and your symptoms suddenly get worse. °MAKE SURE YOU:  °· Understand these instructions. °· Will watch your condition. °· Will get help right away if you are not doing well or get worse. °Document Released: 02/06/2008 Document Revised: 01/04/2014 Document Reviewed: 04/27/2012 °[employer]® Patient Information ©2015 [employer]. This information is not intended to replace advice given to you by your health care provider. Make sure you discuss any questions you have with your health care provider. ° °Nausea and Vomiting °Nausea means you feel sick to your stomach. Throwing up (vomiting) is a reflex where stomach contents come out of your mouth. °HOME CARE  °· Take medicine as told by your doctor. °· Do not force yourself to eat. However, you do need to drink fluids. °· If you feel like eating, eat a normal diet as told by your doctor. °¨ Eat rice, wheat, potatoes, bread, lean meats, yogurt, fruits, and vegetables. °¨ Avoid high-fat foods. °· Drink enough fluids to keep your pee (urine) clear or pale yellow. °· Ask your doctor how to replace body fluid losses (rehydrate). Signs of body fluid loss (dehydration) include: °¨ Feeling very thirsty. °¨ Dry lips and mouth. °¨ Feeling dizzy. °¨   Dark pee. °¨ Peeing less than normal. °¨ Feeling confused. °¨ Fast breathing or heart rate. °GET HELP RIGHT AWAY IF:  °· You have blood in your throw up. °· You have black or bloody poop (stool). °· You have a bad headache or stiff neck. °· You feel confused. °· You have bad belly (abdominal) pain. °· You have chest pain or trouble breathing. °· You do not pee at least once every 8 hours. °· You have cold, clammy skin. °· You keep throwing up after 24 to 48 hours. °· You have a  fever. °MAKE SURE YOU:  °· Understand these instructions. °· Will watch your condition. °· Will get help right away if you are not doing well or get worse. °Document Released: 02/06/2008 Document Revised: 11/12/2011 Document Reviewed: 01/19/2011 °[employer]® Patient Information ©2015 [employer]. This information is not intended to replace advice given to you by your health care provider. Make sure you discuss any questions you have with your health care provider. ° °

## 2015-06-01 NOTE — ED Notes (Signed)
Pt was able to tolerate PO fluids, and crackers.

## 2015-06-01 NOTE — ED Notes (Signed)
Called for triage x1.

## 2015-06-01 NOTE — ED Provider Notes (Signed)
CSN: [id number]     Arrival date & time 06/01/15  1141 History   First MD Initiated Contact with Patient 06/01/15 1549     Chief Complaint  Patient presents with  . Emesis  . Diarrhea     (Consider location/radiation/quality/duration/timing/severity/associated sxs/prior Treatment) Patient is a 54 y.o. female presenting with vomiting and diarrhea. The history is provided by the patient.  Emesis Severity:  Severe Duration:  2 days Timing:  Constant Number of daily episodes:  "hundreds" Quality:  Stomach contents Feeding tolerance: not able to tolerate PO intake. Progression:  Worsening Chronicity:  New Recent urination:  Normal Relieved by:  Nothing Worsened by:  Nothing tried Ineffective treatments:  None tried Associated symptoms: diarrhea   Associated symptoms: no abdominal pain, no chills and no fever   Diarrhea Associated symptoms: vomiting   Associated symptoms: no abdominal pain and no chills     Past Medical History  Diagnosis Date  . Chronic shoulder pain     "right; I've got bad rotator cuff"  . COPD (chronic obstructive pulmonary disease)   . CAD, multiple vessel   . Hyperlipidemia   . GERD (gastroesophageal reflux disease)   . Chest tightness   . Angina pectoris   . Wears glasses   . Anxiety   . Depression   . History of hiatal hernia   . 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 Myocardial infarction 11/2013    PCI w/ drug eluting stent LCx  . Asthma   . Hypothyroidism     PMH:  . IDDM (insulin dependent diabetes mellitus)   . Diabetes mellitus with complication   . Neuropathy due to secondary diabetes   . History of blood transfusion     "when I had my 1st youngun; when I had MI"  . History of stomach ulcers   . Headache     "monthly" (11/22/2014)  . Arthritis     "hands, neck, back, knees, hips, ankles" (11/22/2014)  . Chronic lower back pain   . Incidental lung nodule, > 3mm and < 8mm 11/22/2014    Small  non-calcified nodules left lung   Past Surgical History  Procedure Laterality Date  . Cesarean section  1982; 1984  . Gastric resection  1990's    bezoar; "for reflux"  . Coronary angioplasty with stent placement  11/11/2013    Drug-eluting stent in LCx  . Breast surgery Left 1990's    breast duct surgery  . Colonoscopy    . Esophagogastroduodenoscopy    . Coronary artery bypass graft N/A 09/22/2014    Procedure: CORONARY ARTERY BYPASS GRAFTING (CABG);  Surgeon: Purcell Nails, MD;  Location: [facility] OR;  Service: Open Heart Surgery;  Laterality: N/A;  Times 2 using left internal mammary artery and endoscopically harvested right saphenous vein  . Tee without cardioversion N/A 09/22/2014    Procedure: TRANSESOPHAGEAL ECHOCARDIOGRAM (TEE);  Surgeon: Purcell Nails, MD;  Location: [facility] OR;  Service: Open Heart Surgery;  Laterality: N/A;  . Laparoscopic cholecystectomy  2000  . Abdominal hysterectomy  1990's  . Cardiac catheterization  09/01/14  . Sternal wound debridement N/A 11/23/2014    Procedure: SUPERFICIAL STERNAL WOUND DEBRIDEMENT;  Surgeon: Purcell Nails, MD;  Location: [facility] OR;  Service: Thoracic;  Laterality: N/A;  . Application of wound vac N/A 11/23/2014    Procedure: POSSIBLE APPLICATION OF WOUND VAC;  Surgeon: Purcell Nails, MD;  Location: [facility] OR;  Service: Thoracic;  Laterality: N/A;   Family History  Problem Relation Age of Onset  . Adopted: Yes  . Family history unknown: Yes   Social History  Substance Use Topics  . Smoking status: Current Every Day Smoker -- 2.00 packs/day for 41 years    Types: Cigarettes  . Smokeless tobacco: Former Neurosurgeon    Types: Chew    Quit date: 09/13/2005  . Alcohol Use: No   OB History    No data available     Review of Systems  Constitutional: Negative for chills.  Respiratory: Negative for shortness of breath.   Cardiovascular: Negative for chest pain.  Gastrointestinal: Positive for nausea, vomiting and diarrhea. Negative for abdominal  pain, constipation and blood in stool.  Genitourinary: Negative for dysuria, urgency, hematuria and flank pain.  Musculoskeletal: Positive for back pain.       Chronic lower back pain  Neurological: Positive for weakness.      Allergies  Wellbutrin  Home Medications   Prior to Admission medications   Medication Sig Start Date End Date Taking? Authorizing Provider  nitroGLYCERIN (NITROSTAT) 0.4 MG SL tablet Place 0.4 mg under the tongue.   Yes Historical Provider, MD  albuterol (PROVENTIL HFA;VENTOLIN HFA) 108 (90 BASE) MCG/ACT inhaler Inhale 2 puffs into the lungs every 6 (six) hours as needed for wheezing or shortness of breath.    Historical Provider, MD  aspirin EC 81 MG tablet Take 81 mg by mouth daily.    Historical Provider, MD  carvedilol (COREG) 3.125 MG tablet Take 1 tablet (3.125 mg total) by mouth 2 (two) times daily with a meal. 11/26/14   Donielle Margaretann Loveless, PA-C  fenofibrate (TRICOR) 145 MG tablet Take 145 mg by mouth daily.    Historical Provider, MD  Fluticasone-Salmeterol (ADVAIR) 100-50 MCG/DOSE AEPB Inhale 1 puff into the lungs 2 (two) times daily.    Historical Provider, MD  insulin aspart (NOVOLOG FLEXPEN) 100 UNIT/ML FlexPen Inject 6-8 units once daily before supper or large meals 04/26/15   Reather Littler, MD  insulin glargine (LANTUS) 100 UNIT/ML injection Inject into the skin 2 (two) times daily. Inject 50 units in am and 40 units in pm    Historical Provider, MD  insulin lispro (HUMALOG KWIKPEN) 100 UNIT/ML KiwkPen 6-8 units before supper or large meals 04/25/15   Reather Littler, MD  lisinopril (PRINIVIL,ZESTRIL) 2.5 MG tablet Take 1 tablet (2.5 mg total) by mouth daily. 11/29/14   Donielle Margaretann Loveless, PA-C  loperamide (IMODIUM) 2 MG capsule Take 1 capsule (2 mg total) by mouth 4 (four) times daily as needed for diarrhea or loose stools. 06/01/15   Cheri Fowler, PA-C  metFORMIN (GLUCOPHAGE-XR) 500 MG 24 hr tablet 2 tablets at supper 01/26/15   Reather Littler, MD  morphine  (KADIAN) 50 MG 24 hr capsule Take 25 mg by mouth 2 (two) times daily.    Historical Provider, MD  morphine (MSIR) 15 MG tablet Take 15 mg by mouth every 12 (twelve) hours.    Historical Provider, MD  ondansetron (ZOFRAN) 4 MG tablet Take 1 tablet (4 mg total) by mouth every 6 (six) hours. 06/01/15   Cheri Fowler, PA-C  Oxycodone HCl 10 MG TABS Take 10 mg by mouth every 8 (eight) hours as needed.    Historical Provider, MD  promethazine (PHENERGAN) 25 MG tablet Take 1 tablet (25 mg total) by mouth every 8 (eight) hours as needed for nausea. 12/02/14   Purcell Nails, MD  ticagrelor (BRILINTA) 90 MG TABS  tablet Take by mouth 2 (two) times daily.    Historical Provider, MD  tiotropium (SPIRIVA) 18 MCG inhalation capsule Place 18 mcg into inhaler and inhale daily.    Historical Provider, MD   BP 147/77 mmHg  Pulse 88  Temp(Src) 98.2 F (36.8 C) (Oral)  Resp 19  Ht 5\' 3"  (1.6 m)  Wt 203 lb (92.08 kg)  BMI 35.97 kg/m2  SpO2 94% Physical Exam  Constitutional: She is oriented to person, place, and time. She appears well-developed and well-nourished.  HENT:  Head: Normocephalic and atraumatic.  Mouth/Throat: Oropharynx is clear and moist.  Eyes: Conjunctivae and EOM are normal.  Neck: Normal range of motion. Neck supple.  Cardiovascular: Normal rate, regular rhythm and normal heart sounds.   No murmur heard. Pulmonary/Chest: Effort normal and breath sounds normal. No respiratory distress. She has no wheezes. She has no rales.  Abdominal: Soft. Bowel sounds are normal. She exhibits no distension. There is no tenderness. There is no rebound and no guarding.  Musculoskeletal: Normal range of motion.  Lymphadenopathy:    She has no cervical adenopathy.  Neurological: She is alert and oriented to person, place, and time. She has normal strength. No sensory deficit.  Skin: Skin is warm and dry.  Psychiatric: She has a normal mood and affect. Her behavior is normal.    ED Course  Procedures  (including critical care time) Labs Review Labs Reviewed  COMPREHENSIVE METABOLIC PANEL - Abnormal; Notable for the following:    Sodium 133 (*)    Chloride 99 (*)    Glucose, Bld 364 (*)    ALT 61 (*)    All other components within normal limits  CBC - Abnormal; Notable for the following:    WBC 16.3 (*)    RBC 5.61 (*)    Hemoglobin 17.3 (*)    HCT 48.4 (*)    All other components within normal limits  URINALYSIS, ROUTINE W REFLEX MICROSCOPIC (NOT AT [facility]) - Abnormal; Notable for the following:    APPearance CLOUDY (*)    Specific Gravity, Urine 1.035 (*)    Glucose, UA >1000 (*)    Hgb urine dipstick TRACE (*)    Bilirubin Urine SMALL (*)    Ketones, ur 15 (*)    Protein, ur 30 (*)    All other components within normal limits  URINE MICROSCOPIC-ADD ON - Abnormal; Notable for the following:    Squamous Epithelial / LPF MANY (*)    Bacteria, UA FEW (*)    All other components within normal limits  CBG MONITORING, ED - Abnormal; Notable for the following:    Glucose-Capillary 305 (*)    All other components within normal limits  LIPASE, BLOOD  I-STAT BETA HCG BLOOD, ED ([facility], [facility], [facility] ONLY)  I-STAT TROPOININ, ED    Imaging Review Dg Chest 2 View  06/01/2015   CLINICAL DATA:  Flu like symptoms for the past few days. Difficulty breathing this morning.  EXAM: CHEST  2 VIEW  COMPARISON:  12/06/2014  FINDINGS: The cardiac silhouette, mediastinal and hilar contours are normal and stable. Stable surgical changes from bypass surgery. The lungs are clear. No pleural effusion. The bony thorax is intact.  IMPRESSION: No acute cardiopulmonary findings.   Electronically Signed   By: Rudie Meyer M.D.   On: 06/01/2015 18:44   I have personally reviewed and evaluated these images and lab results as part of my medical decision-making.   EKG Interpretation   Date/Time:  Wednesday June 01 2015 18:18:59 EDT Ventricular Rate:  93 PR Interval:  132 QRS Duration: 110 QT Interval:   428 QTC Calculation: 532 R Axis:   69 Text Interpretation:  Sinus rhythm Inferior infarct, old Lateral leads are  also involved No significant change since last tracing Confirmed by YAO   MD, DAVID ([id number]) on 06/01/2015 7:19:11 PM      MDM   Final diagnoses:  Non-intractable vomiting with nausea, vomiting of unspecified type  Diarrhea    Patient presents with 2 day history of N/V/D.  Decreased PO intake.  Hx of DM.  VSS, NAD, non-toxic.  On exam, no focal neurological findings, abdomen soft non-tender.  Initial labs include hcg, lipase, CMP, CBC, UA.  WBC 16.3.  Elevated glucose at 364; no AG.  Doubt DKA.  Will start IVF,  zofran, and tylenol.  Will reassess.  UA shows glycosuria and low specific gravity consistent with dehydration and elevated blood glucose.  Doubt acute abdominal etiology such as appendicitis, diverticulitis given history and physical exam.  No abdominal imaging indicated at this time given benign abdomen physical exam.  Will order EKG, CXR, and troponin given her cardiac history.  Recheck glucose with CBG.    Upon reassessment, patient's symptoms have improved.  Tolerating PO intake. If troponin and CXR negative.  Likely viral etiology.  Will d/c home with zofran, loperamide with PCP follow up.   Case has been discussed with and seen by Dr. Silverio Lay who agrees with the above plan for discharge.     Cheri Fowler, PA-C 06/01/15 1944  Richardean Canal, MD 06/02/15 [phone]

## 2015-06-01 NOTE — ED Notes (Signed)
Pt reports N/V/D for 2 days. Pt denies any abdominal pain but reports lower back pain.

## 2015-06-24 ENCOUNTER — Encounter: Payer: Self-pay | Admitting: Endocrinology

## 2015-06-24 ENCOUNTER — Ambulatory Visit (INDEPENDENT_AMBULATORY_CARE_PROVIDER_SITE_OTHER): Payer: BLUE CROSS/BLUE SHIELD | Admitting: Endocrinology

## 2015-06-24 VITALS — BP 134/76 | HR 88 | Temp 98.5°F | Resp 16 | Ht 63.0 in | Wt 199.8 lb

## 2015-06-24 DIAGNOSIS — Z794 Long term (current) use of insulin: Secondary | ICD-10-CM

## 2015-06-24 DIAGNOSIS — E1165 Type 2 diabetes mellitus with hyperglycemia: Secondary | ICD-10-CM

## 2015-06-24 DIAGNOSIS — E1142 Type 2 diabetes mellitus with diabetic polyneuropathy: Secondary | ICD-10-CM

## 2015-06-24 DIAGNOSIS — E785 Hyperlipidemia, unspecified: Secondary | ICD-10-CM

## 2015-06-24 NOTE — Progress Notes (Signed)
Patient ID: Sara Hobbs, female   DOB: 11/28/1960, 54 y.o.   MRN: [id number]           Reason for Appointment: follow-up for Type 2 Diabetes  Referring physician: Cloward  History of Present Illness:          Date of diagnosis of type 2 diabetes mellitus :        Background history:  She thinks she was treated with metformin for the first few years Although she says she was having some diarrhea with metformin she continued to take it but was probably taking 500 mg twice a day; not clear if she was taking the extended release her not She does not know if she took her medications for her diabetes with metformin At some point metformin was stopped because of kidney function abnormality and insulin started, probably in 2002 She thinks she has been on Lantus insulin most of the time Her blood sugar control has been consistently poor; A1c had been at least 9% and as high as 13% since 2013 She had also been on Invokana for some time but had been having difficulty with recurrent vaginal candidiasis with this  Recent history:   INSULIN regimen is described as: Lantus  55 units every morning--40 or 45 units p.m.  Novolog 6-8 at suppertime   Her A1c was 6.9% in August when her blood sugars had started improving However even though she was started on mealtime insulin coverage in 8/16 at suppertime her blood sugars overall appear to be higher  Current blood sugar patterns and problems identified:   She has not brought her blood sugar monitor for review and not clear how often she is checking  She has had other intercurrent medical problems and she things blood sugars are higher from this  Her appetite has been variable and has lost some weight also  She thinks her blood sugars are high all the time especially before and after supper.  This is despite her saying that she is compliant with taking the insulin before eating  Is able to tolerate only 1000 mg of metformin ER  She is still not  able to be active for various reasons  Oral hypoglycemic drugs the patient is taking are: metformin ER 1000 mg in evening Side effects from medications have been: Diarrhea from regular metformin, candidiasis from Invokana  Compliance with the medical regimen: Fair Hypoglycemia: None  Glucose monitoring:  done usually 1-2  times a day         Glucometer: One Touch.      Blood Glucose readings by recall:   PRE-MEAL Fasting Lunch Dinner Bedtime Overall  Glucose range: 150  200 250   Mean/median:         Self-care: The diet that the patient has been following is: tries to limit fat fats    Meal times: Breakfast is Variable Lunch: 12-3 pm  Dinner: 6 pm  Typical meal intake: Breakfast is applesauce or burritos.  Will have a sandwich for lunch and meat and vegetables for dinner.  Has applesauce or chips for snacks.  Will drink sweet tea 1x daily at supper              Diabetes education: None             Exercise:  unable to do any  Weight history: previously maximum weight was 260  Wt Readings from Last 3 Encounters:  06/24/15 199 lb 12.8 oz (90.629 kg)  06/01/15 203 lb (  92.08 kg)  04/25/15 204 lb (92.534 kg)    Glycemic control:   Lab Results  Component Value Date   HGBA1C 6.9* 04/20/2015   HGBA1C 9.1* 01/26/2015   HGBA1C 10.0* 11/23/2014   Lab Results  Component Value Date   MICROALBUR <0.7 01/26/2015   CREATININE 0.85 06/01/2015         Medication List       This list is accurate as of: 06/24/15 11:59 PM.  Always use your most recent med list.               albuterol 108 (90 BASE) MCG/ACT inhaler  Commonly known as:  PROVENTIL HFA;VENTOLIN HFA  Inhale 2 puffs into the lungs every 6 (six) hours as needed for wheezing or shortness of breath.     aspirin EC 81 MG tablet  Take 81 mg by mouth daily.     atorvastatin 80 MG tablet  Commonly known as:  LIPITOR  Take 80 mg by mouth daily.     carvedilol 3.125 MG tablet  Commonly known as:  COREG  Take 1  tablet (3.125 mg total) by mouth 2 (two) times daily with a meal.     fenofibrate 145 MG tablet  Commonly known as:  TRICOR  Take 145 mg by mouth daily.     Fluticasone-Salmeterol 100-50 MCG/DOSE Aepb  Commonly known as:  ADVAIR  Inhale 1 puff into the lungs 2 (two) times daily.     insulin aspart 100 UNIT/ML FlexPen  Commonly known as:  NOVOLOG FLEXPEN  Inject 6-8 units once daily before supper or large meals     insulin glargine 100 UNIT/ML injection  Commonly known as:  LANTUS  Inject into the skin 2 (two) times daily. Inject 50 units in am and 40 units in pm     insulin lispro 100 UNIT/ML KiwkPen  Commonly known as:  HUMALOG KWIKPEN  6-8 units before supper or large meals     lisinopril 2.5 MG tablet  Commonly known as:  PRINIVIL,ZESTRIL  Take 1 tablet (2.5 mg total) by mouth daily.     loperamide 2 MG capsule  Commonly known as:  IMODIUM  Take 1 capsule (2 mg total) by mouth 4 (four) times daily as needed for diarrhea or loose stools.     metFORMIN 500 MG 24 hr tablet  Commonly known as:  GLUCOPHAGE-XR  2 tablets at supper     metoCLOPramide 10 MG tablet  Commonly known as:  REGLAN  Take 10 mg by mouth 4 (four) times daily.     morphine 15 MG tablet  Commonly known as:  MSIR  Take 15 mg by mouth every 12 (twelve) hours.     morphine 50 MG 24 hr capsule  Commonly known as:  KADIAN  Take 25 mg by mouth 2 (two) times daily.     nitroGLYCERIN 0.4 MG SL tablet  Commonly known as:  NITROSTAT  Place 0.4 mg under the tongue.     ondansetron 4 MG tablet  Commonly known as:  ZOFRAN  Take 1 tablet (4 mg total) by mouth every 6 (six) hours.     oxymorphone 10 MG T12a 12 hr tablet  Commonly known as:  OPANA ER  Take 10 mg by mouth.     promethazine 25 MG tablet  Commonly known as:  PHENERGAN  Take 1 tablet (25 mg total) by mouth every 8 (eight) hours as needed for nausea.     ticagrelor 90 MG Tabs tablet  Commonly known as:  BRILINTA  Take by mouth 2 (two)  times daily.     tiotropium 18 MCG inhalation capsule  Commonly known as:  SPIRIVA  Place 18 mcg into inhaler and inhale daily.        Allergies:  Allergies  Allergen Reactions  . Wellbutrin [Bupropion] Other (See Comments)    SEVERE CHEST PAIN    Past Medical History  Diagnosis Date  . Chronic shoulder pain     "right; I've got bad rotator cuff"  . COPD (chronic obstructive pulmonary disease) (HCC)   . CAD, multiple vessel   . Hyperlipidemia   . GERD (gastroesophageal reflux disease)   . Chest tightness   . Angina pectoris (HCC)   . Wears glasses   . Anxiety   . Depression   . History of hiatal hernia   . 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 Myocardial infarction (HCC) 11/2013    PCI w/ drug eluting stent LCx  . Asthma   . Hypothyroidism     PMH:  . IDDM (insulin dependent diabetes mellitus) (HCC)   . Diabetes mellitus with complication (HCC)   . Neuropathy due to secondary diabetes (HCC)   . History of blood transfusion     "when I had my 1st youngun; when I had MI"  . History of stomach ulcers   . Headache     "monthly" (11/22/2014)  . Arthritis     "hands, neck, back, knees, hips, ankles" (11/22/2014)  . Chronic lower back pain   . Incidental lung nodule, > 3mm and < 8mm 11/22/2014    Small non-calcified nodules left lung    Past Surgical History  Procedure Laterality Date  . Cesarean section  1982; 1984  . Gastric resection  1990's    bezoar; "for reflux"  . Coronary angioplasty with stent placement  11/11/2013    Drug-eluting stent in LCx  . Breast surgery Left 1990's    breast duct surgery  . Colonoscopy    . Esophagogastroduodenoscopy    . Coronary artery bypass graft N/A 09/22/2014    Procedure: CORONARY ARTERY BYPASS GRAFTING (CABG);  Surgeon: Purcell Nails, MD;  Location: [facility] OR;  Service: Open Heart Surgery;  Laterality: N/A;  Times 2 using left internal mammary artery and endoscopically harvested right  saphenous vein  . Tee without cardioversion N/A 09/22/2014    Procedure: TRANSESOPHAGEAL ECHOCARDIOGRAM (TEE);  Surgeon: Purcell Nails, MD;  Location: [facility] OR;  Service: Open Heart Surgery;  Laterality: N/A;  . Laparoscopic cholecystectomy  2000  . Abdominal hysterectomy  1990's  . Cardiac catheterization  09/01/14  . Sternal wound debridement N/A 11/23/2014    Procedure: SUPERFICIAL STERNAL WOUND DEBRIDEMENT;  Surgeon: Purcell Nails, MD;  Location: [facility] OR;  Service: Thoracic;  Laterality: N/A;  . Application of wound vac N/A 11/23/2014    Procedure: POSSIBLE APPLICATION OF WOUND VAC;  Surgeon: Purcell Nails, MD;  Location: [facility] OR;  Service: Thoracic;  Laterality: N/A;    Family History  Problem Relation Age of Onset  . Adopted: Yes  . Family history unknown: Yes    Social History:  reports that she has been smoking Cigarettes.  She has a 82 pack-year smoking history. She quit smokeless tobacco use about 9 years ago. Her smokeless tobacco use included Chew. She reports that she does not drink alcohol or use illicit drugs.    Review of Systems  Lipid history: Has had increased triglycerides, and last LDL was also high  Not clear if this is being managed by cardiologist in [facility] or her PCP    Lab Results  Component Value Date   CHOL 235* 04/20/2015   HDL 30.10* 04/20/2015   LDLDIRECT 152.0 04/20/2015   TRIG * 04/20/2015    482.0 Triglyceride is over 400; calculations on Lipids are invalid.   CHOLHDL 8 04/20/2015             Has long-standing history of numbness, burning, pains or tingling in feet and lower legs.  Symptoms may occur during the daytime. She is on extended-release morphine for other problems  Diabetic foot exam in 5/16 showed the following: Monofilament sensation markedly decreased in the distal toes   Physical Examination:  BP 134/76 mmHg  Pulse 88  Temp(Src) 98.5 F (36.9 C)  Resp 16  Ht 5\' 3"  (1.6 m)  Wt 199 lb 12.8 oz (90.629 kg)  BMI 35.40  kg/m2  SpO2 95%   No ankle edema present    ASSESSMENT/PLAN:  Diabetes type 2, uncontrolled    See history of present illness for detailed discussion of his current management, blood sugar patterns and problems identified  Her blood sugars appear to be higher and not care of this is related to intercurrent illnesses She continues to need relatively large doses of insulin Now appears to have consistently high postprandial readings after lunch and supper Taking only small amounts of Novolog at suppertime with inadequate action Today her blood sugar patterns could not be analyzed because of her not bringing her meter and not clear how often she is monitoring However blood sugars are mildly increased in the morning and probably progressively higher the rest of the day Some of her control is also limited by her continuing to drink drinks which sugar especially at suppertime  Currently she is not able to exercise  Since she is limited with her inability to add another diabetes drugs such as Victoza will continue to modify her insulin Discussed needing to gradually increase her suppertime Novolog and also had another 10 units at lunchtime Again encouraged her to improve her diet and reduce drinks which sugar She will bring her monitor for download some of: Discussed timing and targets for blood sugars Also will need to increase her Lantus in the morning and reduce evening dose to 40 consistently  Instructions for day-to-day care as follows:  Patient Instructions     Check blood sugars on waking up 3-4  times a week Also check blood sugars about 2 hours after a meal and do this after different meals by rotation  Recommended blood sugar levels on waking up is 90-130 and about 2 hours after meal is 130-160  Please bring your blood sugar monitor to each visit, thank you  NOVOLOG 10 UNITS AT LUNCH AND 12-14 at supper  Check more sugars when sick  Lantus 60 in am and 40 at night       Counseling time on subjects discussed above is over 50% of today's 25 minute visit   Kejon Feild 06/26/2015, 8:41 PM   Note: This office note was prepared with Insurance underwriter. Any transcriptional errors that result from this process are unintentional.

## 2015-06-24 NOTE — Patient Instructions (Addendum)
   Check blood sugars on waking up 3-4  times a week Also check blood sugars about 2 hours after a meal and do this after different meals by rotation  Recommended blood sugar levels on waking up is 90-130 and about 2 hours after meal is 130-160  Please bring your blood sugar monitor to each visit, thank you  NOVOLOG 10 UNITS AT LUNCH AND 12-14 at supper  Check more sugars when sick  Lantus 60 in am and 40 at night

## 2015-07-20 ENCOUNTER — Other Ambulatory Visit: Payer: BLUE CROSS/BLUE SHIELD

## 2015-07-21 ENCOUNTER — Other Ambulatory Visit (INDEPENDENT_AMBULATORY_CARE_PROVIDER_SITE_OTHER): Payer: BLUE CROSS/BLUE SHIELD

## 2015-07-21 DIAGNOSIS — Z794 Long term (current) use of insulin: Secondary | ICD-10-CM

## 2015-07-21 DIAGNOSIS — E785 Hyperlipidemia, unspecified: Secondary | ICD-10-CM | POA: Diagnosis not present

## 2015-07-21 DIAGNOSIS — E1165 Type 2 diabetes mellitus with hyperglycemia: Secondary | ICD-10-CM | POA: Diagnosis not present

## 2015-07-21 LAB — COMPREHENSIVE METABOLIC PANEL
ALBUMIN: 3.8 g/dL (ref 3.5–5.2)
ALT: 59 U/L — ABNORMAL HIGH (ref 0–35)
AST: 29 U/L (ref 0–37)
Alkaline Phosphatase: 118 U/L — ABNORMAL HIGH (ref 39–117)
BUN: 13 mg/dL (ref 6–23)
CO2: 30 mEq/L (ref 19–32)
Calcium: 9.3 mg/dL (ref 8.4–10.5)
Chloride: 98 mEq/L (ref 96–112)
Creatinine, Ser: 0.77 mg/dL (ref 0.40–1.20)
GFR: 83.06 mL/min (ref >60.00)
Glucose, Bld: 317 mg/dL — ABNORMAL HIGH (ref 70–99)
POTASSIUM: 3.5 meq/L (ref 3.5–5.1)
Sodium: 135 mEq/L (ref 135–145)
TOTAL PROTEIN: 6.4 g/dL (ref 6.0–8.3)
Total Bilirubin: 0.6 mg/dL (ref 0.2–1.2)

## 2015-07-21 LAB — LIPID PANEL
CHOL/HDL RATIO: 4
Cholesterol: 104 mg/dL (ref 0–200)
HDL: 27.8 mg/dL — ABNORMAL LOW (ref >39.00)
LDL Cholesterol: 44 mg/dL (ref 0–99)
NONHDL: 76.6
TRIGLYCERIDES: 162 mg/dL — AB (ref 0.0–149.0)
VLDL: 32.4 mg/dL (ref 0.0–40.0)

## 2015-07-21 LAB — HEMOGLOBIN A1C: HEMOGLOBIN A1C: 10.2 % — AB (ref 4.6–6.5)

## 2015-07-25 ENCOUNTER — Other Ambulatory Visit: Payer: Self-pay | Admitting: *Deleted

## 2015-07-25 ENCOUNTER — Ambulatory Visit (INDEPENDENT_AMBULATORY_CARE_PROVIDER_SITE_OTHER): Payer: BLUE CROSS/BLUE SHIELD | Admitting: Endocrinology

## 2015-07-25 ENCOUNTER — Encounter: Payer: Self-pay | Admitting: Endocrinology

## 2015-07-25 VITALS — BP 132/79 | HR 78 | Temp 97.8°F | Ht 63.0 in | Wt 205.2 lb

## 2015-07-25 DIAGNOSIS — Z794 Long term (current) use of insulin: Secondary | ICD-10-CM | POA: Diagnosis not present

## 2015-07-25 DIAGNOSIS — E1165 Type 2 diabetes mellitus with hyperglycemia: Secondary | ICD-10-CM | POA: Diagnosis not present

## 2015-07-25 MED ORDER — METFORMIN HCL ER 500 MG PO TB24: ORAL_TABLET | ORAL | Status: DC

## 2015-07-25 MED ORDER — ONETOUCH DELICA LANCETS 33G MISC: Status: DC

## 2015-07-25 MED ORDER — INSULIN REGULAR HUMAN (CONC) 500 UNIT/ML ~~LOC~~ SOPN: PEN_INJECTOR | SUBCUTANEOUS | Status: DC

## 2015-07-25 MED ORDER — GLUCOSE BLOOD VI STRP: ORAL_STRIP | Status: DC

## 2015-07-25 NOTE — Progress Notes (Signed)
Patient ID: Sara Hobbs, female   DOB: 1961-06-01, 54 y.o.   MRN: [id number]           Reason for Appointment: follow-up for Type 2 Diabetes  Referring physician: Cloward  History of Present Illness:          Date of diagnosis of type 2 diabetes mellitus :        Background history:  She thinks she was treated with metformin for the first few years Although she says she was having some diarrhea with metformin she continued to take it but was probably taking 500 mg twice a day; not clear if she was taking the extended release her not She does not know if she took her medications for her diabetes with metformin At some point metformin was stopped because of kidney function abnormality and insulin started, probably in 2002 She thinks she has been on Lantus insulin most of the time Her blood sugar control has been consistently poor; A1c had been at least 9% and as high as 13% since 2013 She had also been on Invokana for some time but had been having difficulty with recurrent vaginal candidiasis with this  Recent history:   INSULIN regimen is described as: Lantus  50 units every morning--55 units p.m.  Novolog 18 at lunch and supper   Her A1c was 6.9% in August but since then her blood sugars have been much higher than before Previously had to stop Invokana because of candidiasis On her last visit she was told to increase her morning Lantus but has not done so but has increased her mealtime dose significantly However on her last visit she had not brought her glucose monitoring record  Current blood sugar patterns and problems identified:   She has significantly high blood sugars throughout the day although is checking primarily on waking up in in the early afternoon  She ran out of her metformin a month ago and did not refill it, did not ask for a new prescription  She has not had as much nausea since her last visit and her weight is going up  She is also concerned about the cost  of brand-name insulin   She has only one reading after supper which is over 400  She thinks she is cutting back on drinks with sugar  She is still not able to be active for various reasons  Oral hypoglycemic drugs the patient is taking are: metformin ER 1000 mg in evening Side effects from medications have been: Diarrhea from regular metformin, candidiasis from Invokana  Compliance with the medical regimen: Fair Hypoglycemia: None  Glucose monitoring:  done usually 1-2  times a day         Glucometer: One Touch.      Blood Glucose readings by monitor download  Mean values apply above for all meters except median for One Touch  PRE-MEAL Fasting Lunch  2-5 PM  Bedtime Overall  Glucose range:  210-374   304, 326   199-346   464    Mean/median:      289      Self-care: The diet that the patient has been following is: tries to limit fat fats    Meal times: Breakfast is Variable Lunch: 12-3 pm  Dinner: 6 pm  Typical meal intake: Breakfast is applesauce or burritos.  Will have a sandwich for lunch and meat and vegetables for dinner.  Has applesauce or chips for snacks.  Will drink sweet tea periodically, less now  Diabetes education: None             Exercise:  unable to do any  Weight history: previously maximum weight was 260  Wt Readings from Last 3 Encounters:  07/25/15 205 lb 4 oz (93.101 kg)  06/24/15 199 lb 12.8 oz (90.629 kg)  06/01/15 203 lb (92.08 kg)    Glycemic control:   Lab Results  Component Value Date   HGBA1C 10.2* 07/21/2015   HGBA1C 6.9* 04/20/2015   HGBA1C 9.1* 01/26/2015   Lab Results  Component Value Date   MICROALBUR <0.7 01/26/2015   LDLCALC 44 07/21/2015   CREATININE 0.77 07/21/2015         Medication List       This list is accurate as of: 07/25/15  9:20 PM.  Always use your most recent med list.               albuterol 108 (90 BASE) MCG/ACT inhaler  Commonly known as:  PROVENTIL HFA;VENTOLIN HFA  Inhale 2 puffs into  the lungs every 6 (six) hours as needed for wheezing or shortness of breath.     aspirin EC 81 MG tablet  Take 81 mg by mouth daily.     atorvastatin 80 MG tablet  Commonly known as:  LIPITOR  Take 80 mg by mouth daily.     carvedilol 3.125 MG tablet  Commonly known as:  COREG  Take 1 tablet (3.125 mg total) by mouth 2 (two) times daily with a meal.     fenofibrate 145 MG tablet  Commonly known as:  TRICOR  Take 145 mg by mouth daily.     Fluticasone-Salmeterol 100-50 MCG/DOSE Aepb  Commonly known as:  ADVAIR  Inhale 1 puff into the lungs 2 (two) times daily.     glucose blood test strip  Commonly known as:  ONE TOUCH ULTRA TEST  Use as instructed to check blood sugar 2 times per day. Dx code E11.8     insulin glargine 100 UNIT/ML injection  Commonly known as:  LANTUS  Inject into the skin 2 (two) times daily. Inject 50 units in am and 40 units in pm     insulin lispro 100 UNIT/ML KiwkPen  Commonly known as:  HUMALOG KWIKPEN  6-8 units before supper or large meals     insulin regular human CONCENTRATED 500 UNIT/ML kwikpen  Commonly known as:  HUMULIN R  Inject 35 units in am and 30 units in pm     lisinopril 2.5 MG tablet  Commonly known as:  PRINIVIL,ZESTRIL  Take 1 tablet (2.5 mg total) by mouth daily.     loperamide 2 MG capsule  Commonly known as:  IMODIUM  Take 1 capsule (2 mg total) by mouth 4 (four) times daily as needed for diarrhea or loose stools.     metFORMIN 500 MG 24 hr tablet  Commonly known as:  GLUCOPHAGE-XR  2 tablets at supper     metoCLOPramide 10 MG tablet  Commonly known as:  REGLAN  Take 10 mg by mouth 4 (four) times daily.     morphine 15 MG tablet  Commonly known as:  MSIR  Take 15 mg by mouth every 12 (twelve) hours.     nitroGLYCERIN 0.4 MG SL tablet  Commonly known as:  NITROSTAT  Place 0.4 mg under the tongue.     ondansetron 4 MG tablet  Commonly known as:  ZOFRAN  Take 1 tablet (4 mg total) by mouth every 6 (six) hours.  ONETOUCH DELICA LANCETS 33G Misc  Use to check blood sugar 2 times per day dx code E11.8     Oxymorphone HCl (Crush Resist) 20 MG T12a  Take by mouth every 12 (twelve) hours.     promethazine 25 MG tablet  Commonly known as:  PHENERGAN  Take 1 tablet (25 mg total) by mouth every 8 (eight) hours as needed for nausea.     ticagrelor 90 MG Tabs tablet  Commonly known as:  BRILINTA  Take by mouth 2 (two) times daily.     tiotropium 18 MCG inhalation capsule  Commonly known as:  SPIRIVA  Place 18 mcg into inhaler and inhale daily.        Allergies:  Allergies  Allergen Reactions  . Wellbutrin [Bupropion] Other (See Comments)    SEVERE CHEST PAIN    Past Medical History  Diagnosis Date  . Chronic shoulder pain     "right; I've got bad rotator cuff"  . COPD (chronic obstructive pulmonary disease) (HCC)   . CAD, multiple vessel   . Hyperlipidemia   . GERD (gastroesophageal reflux disease)   . Chest tightness   . Angina pectoris (HCC)   . Wears glasses   . Anxiety   . Depression   . History of hiatal hernia   . 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 Myocardial infarction (HCC) 11/2013    PCI w/ drug eluting stent LCx  . Asthma   . Hypothyroidism     PMH:  . IDDM (insulin dependent diabetes mellitus) (HCC)   . Diabetes mellitus with complication (HCC)   . Neuropathy due to secondary diabetes (HCC)   . History of blood transfusion     "when I had my 1st youngun; when I had MI"  . History of stomach ulcers   . Headache     "monthly" (11/22/2014)  . Arthritis     "hands, neck, back, knees, hips, ankles" (11/22/2014)  . Chronic lower back pain   . Incidental lung nodule, > 3mm and < 8mm 11/22/2014    Small non-calcified nodules left lung    Past Surgical History  Procedure Laterality Date  . Cesarean section  1982; 1984  . Gastric resection  1990's    bezoar; "for reflux"  . Coronary angioplasty with stent placement  11/11/2013     Drug-eluting stent in LCx  . Breast surgery Left 1990's    breast duct surgery  . Colonoscopy    . Esophagogastroduodenoscopy    . Coronary artery bypass graft N/A 09/22/2014    Procedure: CORONARY ARTERY BYPASS GRAFTING (CABG);  Surgeon: Purcell Nails, MD;  Location: [facility];  Service: Open Heart Surgery;  Laterality: N/A;  Times 2 using left internal mammary artery and endoscopically harvested right saphenous vein  . Tee without cardioversion N/A 09/22/2014    Procedure: TRANSESOPHAGEAL ECHOCARDIOGRAM (TEE);  Surgeon: Purcell Nails, MD;  Location: [facility] OR;  Service: Open Heart Surgery;  Laterality: N/A;  . Laparoscopic cholecystectomy  2000  . Abdominal hysterectomy  1990's  . Cardiac catheterization  09/01/14  . Sternal wound debridement N/A 11/23/2014    Procedure: SUPERFICIAL STERNAL WOUND DEBRIDEMENT;  Surgeon: Purcell Nails, MD;  Location: [facility] OR;  Service: Thoracic;  Laterality: N/A;  . Application of wound vac N/A 11/23/2014    Procedure: POSSIBLE APPLICATION OF WOUND VAC;  Surgeon: Purcell Nails, MD;  Location: [facility] OR;  Service: Thoracic;  Laterality: N/A;    Family History  Problem Relation Age of Onset  . Adopted: Yes  . Family history unknown: Yes    Social History:  reports that she has been smoking Cigarettes.  She has a 82 pack-year smoking history. She quit smokeless tobacco use about 9 years ago. Her smokeless tobacco use included Chew. She reports that she does not drink alcohol or use illicit drugs.    Review of Systems    Lipid history: Has had increased triglycerides, and last LDL was also high  Not clear if this is being managed by cardiologist in [facility] or her PCP    Lab Results  Component Value Date   CHOL 104 07/21/2015   HDL 27.80* 07/21/2015   LDLCALC 44 07/21/2015   LDLDIRECT 152.0 04/20/2015   TRIG 162.0* 07/21/2015   CHOLHDL 4 07/21/2015             Has long-standing history of numbness, burning, pains or tingling in feet and lower  legs.   Symptoms may occur during the daytime.  She is on extended-release morphine for pain control otherwise  Diabetic foot exam in 5/16 showed the following: Monofilament sensation markedly decreased in the distal toes   Physical Examination:  BP 132/79 mmHg  Pulse 78  Temp(Src) 97.8 F (36.6 C)  Ht  (1.6 m)  Wt 205 lb 4 oz (93.101 kg)  BMI 36.37 kg/m2   No  edema present    ASSESSMENT/PLAN:  Diabetes type 2, uncontrolled    See history of present illness for detailed discussion of his current management, blood sugar patterns and problems identified  Her blood sugars appear to be higher despite increasing her insulin doses, both basal and bolus A1c is significantly higher at 10.2 %  However she is probably not as nauseated and is eating better She may have been benefiting from metformin as blood sugars seem to be higher since she left it off and did not go to get a refill  She is still quite insulin resistant and has difficulty tolerating drugs like Invokana and Victoza may be too expensive and causes nausea and not care of this is related to intercurrent illnesses She is a good candidate for concentrated insulin which may also help her overnight blood sugars  Discussed nature of U-500 insulin, timing of injection, duration of action, dosage adjustment and use of the Humalog quick pen She was given co-pay card to get a free box of 2 pens   Since the sugars are progressively higher during the day she will take the first injection on waking up along with Lantus and the second before  her supper  She will start with 35 units of the U-500 insulin  twice a day  Meanwhile reduce Lantus to 45 twice a day  To restart metformin as before  Review blood sugars by phone on Monday  Also discussed needing to call if blood sugars are low normal and may need to reduce her insulin  Instructions for day-to-day care as follows:  Patient Instructions  Humulin U500: take 35  units in am and 30 min before supper  LANTUS 45 UNITS IN AM AND PM  RESTART METFORMIN  CALL if sugar <90 and also on Monday  Check blood sugars on waking up  3-4 times a week Also check blood sugars about 2 hours after a meal and do this after different meals by rotation  Recommended blood sugar levels on waking up is 90-130 and about 2  hours after meal is 130-160  Please bring your blood sugar monitor to each visit, thank you      Counseling time on subjects discussed above is over 50% of today's 25 minute visit   Domnick Chervenak 07/25/2015, 9:20 PM   Note: This office note was prepared with Insurance underwriter. Any transcriptional errors that result from this process are unintentional.

## 2015-07-25 NOTE — Patient Instructions (Signed)
Humulin U500: take 35 units in am and 30 min before supper  LANTUS 45 UNITS IN AM AND PM  RESTART METFORMIN  CALL if sugar <90 and also on Monday  Check blood sugars on waking up  3-4 times a week Also check blood sugars about 2 hours after a meal and do this after different meals by rotation  Recommended blood sugar levels on waking up is 90-130 and about 2 hours after meal is 130-160  Please bring your blood sugar monitor to each visit, thank you

## 2015-07-26 ENCOUNTER — Telehealth: Payer: Self-pay | Admitting: Endocrinology

## 2015-07-26 NOTE — Telephone Encounter (Signed)
Patient Name: Madlyn Bontempo °Gender: Female °DOB: 12/13/1960 °Age: 53 Y 10 M 28 D °Return Phone °Number: °Address: °City/State/Zip: [place] °Client [facility] Night - Client °Client Site [facility] °Physician Kumar, Ajay °Contact Type Call °Call Type Triage / Clinical °Caller Name Sierra °Relationship To Patient Other °Return Phone Number Please choose phone number °Chief Complaint Prescription Refill or Medication Request (non °symptomatic) °Initial Comment Caller states that she is from [facility], °received a prescription - should it be quick pen or °a vile, insurance wont pay for hemolog - R, need °prior authorization. [phone] °Nurse Assessment °Nurse: Daoud, RN, Deborah Date/Time (Eastern Time): 07/25/2015 5:52:13 PM °Confirm and document reason for call. If symptomatic, °describe symptoms. °---Caller states patient's prescription requires a prior °authorization. States the order says she is supposed to be °checking three times per day but that is less frequent than she °had been doing. Advised to contact the office in the am regarding °the prior authorization and clarification on the frequency. Caller °verbalized understanding °

## 2015-07-26 NOTE — Telephone Encounter (Signed)
Per Dr. Lucianne Muss, the patients meter download shows she is only testing 2 times a day, not three.  A fax was sent back to [facility] informing them of this and that it would not be changed.

## 2015-07-26 NOTE — Telephone Encounter (Signed)
Patient Name: Sara Hobbs Gender: Female DOB: [birth year] Age: 54 Y 10 M 28 D Return Phone Number: Address: City/State/Zip:  [facility] Night - Client Client Site [facility] Physician Reather Littler Contact Type Call Call Type Triage / Clinical Caller Name [place] Relationship To Patient Other Return Phone Number Please choose phone number Chief Complaint Prescription Refill or Medication Request (non symptomatic) Initial Comment Caller states that she is from [place] [facility], received a prescription - should it be quick pen or a vile, insurance wont pay for hemolog - R, need prior authorization. [phone] Nurse Assessment Nurse: Para March RN, Gavin Pound Date/Time (Eastern Time): 07/25/2015 5:52:13 PM Confirm and document reason for call. If symptomatic, describe symptoms. ---Caller states patient's prescription requires a prior authorization. States the order says she is supposed to be checking three times per day but that is less frequent than she had been doing. Advised to contact the office in the am regarding the prior authorization and clarification on the frequency. Caller verbalized understanding

## 2015-08-11 ENCOUNTER — Other Ambulatory Visit: Payer: BLUE CROSS/BLUE SHIELD

## 2015-08-15 ENCOUNTER — Ambulatory Visit: Payer: BLUE CROSS/BLUE SHIELD | Admitting: Endocrinology

## 2015-10-21 ENCOUNTER — Other Ambulatory Visit: Payer: BLUE CROSS/BLUE SHIELD

## 2015-10-26 ENCOUNTER — Ambulatory Visit: Payer: BLUE CROSS/BLUE SHIELD | Admitting: Endocrinology

## 2015-11-25 ENCOUNTER — Inpatient Hospital Stay (HOSPITAL_COMMUNITY)
Admission: EM | Admit: 2015-11-25 | Discharge: 2015-11-29 | DRG: 246 | Disposition: A | Discharge status: Home / Self Care | Payer: BLUE CROSS/BLUE SHIELD | Attending: Cardiovascular Disease | Admitting: Cardiovascular Disease

## 2015-11-25 ENCOUNTER — Encounter (HOSPITAL_COMMUNITY): Payer: Self-pay | Admitting: Cardiovascular Disease

## 2015-11-25 ENCOUNTER — Encounter (HOSPITAL_COMMUNITY)
Admission: EM | Disposition: A | Discharge status: Home / Self Care | Payer: Self-pay | Source: Home / Self Care | Attending: Cardiovascular Disease

## 2015-11-25 ENCOUNTER — Emergency Department (HOSPITAL_COMMUNITY): Payer: BLUE CROSS/BLUE SHIELD

## 2015-11-25 DIAGNOSIS — J9602 Acute respiratory failure with hypercapnia: Secondary | ICD-10-CM

## 2015-11-25 DIAGNOSIS — I469 Cardiac arrest, cause unspecified: Secondary | ICD-10-CM | POA: Diagnosis not present

## 2015-11-25 DIAGNOSIS — J9601 Acute respiratory failure with hypoxia: Secondary | ICD-10-CM | POA: Diagnosis not present

## 2015-11-25 DIAGNOSIS — Z79899 Other long term (current) drug therapy: Secondary | ICD-10-CM | POA: Diagnosis not present

## 2015-11-25 DIAGNOSIS — J9600 Acute respiratory failure, unspecified whether with hypoxia or hypercapnia: Secondary | ICD-10-CM

## 2015-11-25 DIAGNOSIS — Z8711 Personal history of peptic ulcer disease: Secondary | ICD-10-CM | POA: Diagnosis not present

## 2015-11-25 DIAGNOSIS — I10 Essential (primary) hypertension: Secondary | ICD-10-CM | POA: Diagnosis present

## 2015-11-25 DIAGNOSIS — Y832 Surgical operation with anastomosis, bypass or graft as the cause of abnormal reaction of the patient, or of later complication, without mention of misadventure at the time of the procedure: Secondary | ICD-10-CM | POA: Diagnosis present

## 2015-11-25 DIAGNOSIS — R570 Cardiogenic shock: Secondary | ICD-10-CM

## 2015-11-25 DIAGNOSIS — E872 Acidosis: Secondary | ICD-10-CM | POA: Diagnosis present

## 2015-11-25 DIAGNOSIS — E1165 Type 2 diabetes mellitus with hyperglycemia: Secondary | ICD-10-CM | POA: Diagnosis present

## 2015-11-25 DIAGNOSIS — I214 Non-ST elevation (NSTEMI) myocardial infarction: Secondary | ICD-10-CM

## 2015-11-25 DIAGNOSIS — E781 Pure hyperglyceridemia: Secondary | ICD-10-CM | POA: Diagnosis present

## 2015-11-25 DIAGNOSIS — F329 Major depressive disorder, single episode, unspecified: Secondary | ICD-10-CM | POA: Diagnosis present

## 2015-11-25 DIAGNOSIS — J449 Chronic obstructive pulmonary disease, unspecified: Secondary | ICD-10-CM | POA: Diagnosis present

## 2015-11-25 DIAGNOSIS — Z6836 Body mass index (BMI) 36.0-36.9, adult: Secondary | ICD-10-CM

## 2015-11-25 DIAGNOSIS — E669 Obesity, unspecified: Secondary | ICD-10-CM | POA: Diagnosis present

## 2015-11-25 DIAGNOSIS — Z7982 Long term (current) use of aspirin: Secondary | ICD-10-CM

## 2015-11-25 DIAGNOSIS — G931 Anoxic brain damage, not elsewhere classified: Secondary | ICD-10-CM | POA: Diagnosis not present

## 2015-11-25 DIAGNOSIS — T82898A Other specified complication of vascular prosthetic devices, implants and grafts, initial encounter: Secondary | ICD-10-CM | POA: Diagnosis not present

## 2015-11-25 DIAGNOSIS — I959 Hypotension, unspecified: Secondary | ICD-10-CM | POA: Diagnosis present

## 2015-11-25 DIAGNOSIS — Z955 Presence of coronary angioplasty implant and graft: Secondary | ICD-10-CM

## 2015-11-25 DIAGNOSIS — E1140 Type 2 diabetes mellitus with diabetic neuropathy, unspecified: Secondary | ICD-10-CM | POA: Diagnosis present

## 2015-11-25 DIAGNOSIS — G8929 Other chronic pain: Secondary | ICD-10-CM | POA: Diagnosis present

## 2015-11-25 DIAGNOSIS — J9690 Respiratory failure, unspecified, unspecified whether with hypoxia or hypercapnia: Secondary | ICD-10-CM | POA: Diagnosis present

## 2015-11-25 DIAGNOSIS — I252 Old myocardial infarction: Secondary | ICD-10-CM

## 2015-11-25 DIAGNOSIS — IMO0001 Reserved for inherently not codable concepts without codable children: Secondary | ICD-10-CM

## 2015-11-25 DIAGNOSIS — E039 Hypothyroidism, unspecified: Secondary | ICD-10-CM | POA: Diagnosis present

## 2015-11-25 DIAGNOSIS — F17210 Nicotine dependence, cigarettes, uncomplicated: Secondary | ICD-10-CM | POA: Diagnosis present

## 2015-11-25 DIAGNOSIS — K219 Gastro-esophageal reflux disease without esophagitis: Secondary | ICD-10-CM | POA: Diagnosis present

## 2015-11-25 DIAGNOSIS — I4510 Unspecified right bundle-branch block: Secondary | ICD-10-CM | POA: Diagnosis present

## 2015-11-25 DIAGNOSIS — F419 Anxiety disorder, unspecified: Secondary | ICD-10-CM | POA: Diagnosis present

## 2015-11-25 DIAGNOSIS — R451 Restlessness and agitation: Secondary | ICD-10-CM | POA: Diagnosis present

## 2015-11-25 DIAGNOSIS — I2582 Chronic total occlusion of coronary artery: Secondary | ICD-10-CM | POA: Diagnosis present

## 2015-11-25 DIAGNOSIS — Z794 Long term (current) use of insulin: Secondary | ICD-10-CM

## 2015-11-25 DIAGNOSIS — I2510 Atherosclerotic heart disease of native coronary artery without angina pectoris: Secondary | ICD-10-CM | POA: Diagnosis not present

## 2015-11-25 DIAGNOSIS — D7589 Other specified diseases of blood and blood-forming organs: Secondary | ICD-10-CM | POA: Diagnosis present

## 2015-11-25 DIAGNOSIS — E785 Hyperlipidemia, unspecified: Secondary | ICD-10-CM | POA: Diagnosis present

## 2015-11-25 DIAGNOSIS — R739 Hyperglycemia, unspecified: Secondary | ICD-10-CM | POA: Diagnosis not present

## 2015-11-25 DIAGNOSIS — I619 Nontraumatic intracerebral hemorrhage, unspecified: Secondary | ICD-10-CM

## 2015-11-25 DIAGNOSIS — Z7401 Bed confinement status: Secondary | ICD-10-CM | POA: Diagnosis not present

## 2015-11-25 DIAGNOSIS — M1990 Unspecified osteoarthritis, unspecified site: Secondary | ICD-10-CM | POA: Diagnosis present

## 2015-11-25 DIAGNOSIS — R079 Chest pain, unspecified: Secondary | ICD-10-CM | POA: Diagnosis present

## 2015-11-25 DIAGNOSIS — I25110 Atherosclerotic heart disease of native coronary artery with unstable angina pectoris: Secondary | ICD-10-CM | POA: Diagnosis not present

## 2015-11-25 HISTORY — PX: CARDIAC CATHETERIZATION: SHX172

## 2015-11-25 HISTORY — DX: Cardiac arrest, cause unspecified: I46.9

## 2015-11-25 HISTORY — DX: Atherosclerotic heart disease of native coronary artery without angina pectoris: I25.10

## 2015-11-25 LAB — CBC WITH DIFFERENTIAL/PLATELET
BASOS ABS: 0.2 10*3/uL — AB (ref 0.0–0.1)
BASOS ABS: 0 10*3/uL (ref 0.0–0.1)
BASOS PCT: 1 %
Basophils Relative: 0 %
EOS ABS: 0 10*3/uL (ref 0.0–0.7)
Eosinophils Absolute: 0.2 10*3/uL (ref 0.0–0.7)
Eosinophils Relative: 1 %
Eosinophils Relative: 0 %
HEMATOCRIT: 53.3 % — AB (ref 36.0–46.0)
HEMATOCRIT: 44 % (ref 36.0–46.0)
HEMOGLOBIN: 15.3 g/dL — AB (ref 12.0–15.0)
Hemoglobin: 18.5 g/dL — ABNORMAL HIGH (ref 12.0–15.0)
LYMPHS PCT: 55 %
LYMPHS PCT: 10 %
Lymphs Abs: 11.4 10*3/uL — ABNORMAL HIGH (ref 0.7–4.0)
Lymphs Abs: 1.6 10*3/uL (ref 0.7–4.0)
MCH: 32.2 pg (ref 26.0–34.0)
MCH: 30.6 pg (ref 26.0–34.0)
MCHC: 34.7 g/dL (ref 30.0–36.0)
MCHC: 34.8 g/dL (ref 30.0–36.0)
MCV: 92.7 fL (ref 78.0–100.0)
MCV: 88 fL (ref 78.0–100.0)
MONOS PCT: 6 %
MONOS PCT: 10 %
Monocytes Absolute: 1.2 10*3/uL — ABNORMAL HIGH (ref 0.1–1.0)
Monocytes Absolute: 1.6 10*3/uL — ABNORMAL HIGH (ref 0.1–1.0)
NEUTROS ABS: 7.7 10*3/uL (ref 1.7–7.7)
NEUTROS ABS: 12.5 10*3/uL — AB (ref 1.7–7.7)
NEUTROS PCT: 80 %
Neutrophils Relative %: 37 %
Platelets: 228 10*3/uL (ref 150–400)
Platelets: 183 10*3/uL (ref 150–400)
RBC: 5.75 MIL/uL — ABNORMAL HIGH (ref 3.87–5.11)
RBC: 5 MIL/uL (ref 3.87–5.11)
RDW: 13.4 % (ref 11.5–15.5)
RDW: 13.2 % (ref 11.5–15.5)
WBC: 20.7 10*3/uL — ABNORMAL HIGH (ref 4.0–10.5)
WBC: 15.7 10*3/uL — ABNORMAL HIGH (ref 4.0–10.5)

## 2015-11-25 LAB — COMPREHENSIVE METABOLIC PANEL
ALBUMIN: 4.5 g/dL (ref 3.5–5.0)
ALBUMIN: 3.5 g/dL (ref 3.5–5.0)
ALK PHOS: 164 U/L — AB (ref 38–126)
ALK PHOS: 144 U/L — AB (ref 38–126)
ALT: 204 U/L — ABNORMAL HIGH (ref 14–54)
ALT: 256 U/L — AB (ref 14–54)
AST: 262 U/L — AB (ref 15–41)
AST: 338 U/L — ABNORMAL HIGH (ref 15–41)
Anion gap: 22 — ABNORMAL HIGH (ref 5–15)
Anion gap: 12 (ref 5–15)
BILIRUBIN TOTAL: 0.7 mg/dL (ref 0.3–1.2)
BUN: 11 mg/dL (ref 6–20)
BUN: 14 mg/dL (ref 6–20)
CALCIUM: 8 mg/dL — AB (ref 8.9–10.3)
CHLORIDE: 94 mmol/L — AB (ref 101–111)
CHLORIDE: 106 mmol/L (ref 101–111)
CO2: 16 mmol/L — AB (ref 22–32)
CO2: 17 mmol/L — AB (ref 22–32)
CREATININE: 0.81 mg/dL (ref 0.44–1.00)
Calcium: 9.3 mg/dL (ref 8.9–10.3)
Creatinine, Ser: 1.19 mg/dL — ABNORMAL HIGH (ref 0.44–1.00)
GFR calc Af Amer: 59 mL/min — ABNORMAL LOW (ref >60)
GFR calc Af Amer: >60 mL/min (ref >60)
GFR calc non Af Amer: >60 mL/min (ref >60)
GFR, EST NON AFRICAN AMERICAN: 51 mL/min — AB (ref >60)
GLUCOSE: 606 mg/dL — AB (ref 65–99)
GLUCOSE: 512 mg/dL — AB (ref 65–99)
POTASSIUM: 3.6 mmol/L (ref 3.5–5.1)
Potassium: 4.5 mmol/L (ref 3.5–5.1)
SODIUM: 135 mmol/L (ref 135–145)
Sodium: 132 mmol/L — ABNORMAL LOW (ref 135–145)
Total Bilirubin: 1 mg/dL (ref 0.3–1.2)
Total Protein: 7.3 g/dL (ref 6.5–8.1)
Total Protein: 5.4 g/dL — ABNORMAL LOW (ref 6.5–8.1)

## 2015-11-25 LAB — BLOOD GAS, ARTERIAL
Acid-base deficit: 6.1 mmol/L — ABNORMAL HIGH (ref 0.0–2.0)
BICARBONATE: 17.8 meq/L — AB (ref 20.0–24.0)
Drawn by: 42624
FIO2: 0.4
MECHVT: 430 mL
O2 Saturation: 98.8 %
PEEP/CPAP: 5 cmH2O
Patient temperature: 98.6
RATE: 28 resp/min
TCO2: 18.7 mmol/L (ref 0–100)
pCO2 arterial: 29.5 mmHg — ABNORMAL LOW (ref 35.0–45.0)
pH, Arterial: 7.399 (ref 7.350–7.450)
pO2, Arterial: 141 mmHg — ABNORMAL HIGH (ref 80.0–100.0)

## 2015-11-25 LAB — I-STAT CHEM 8, ED
BUN: 13 mg/dL (ref 6–20)
CALCIUM ION: 1.13 mmol/L (ref 1.12–1.23)
CREATININE: 0.8 mg/dL (ref 0.44–1.00)
Chloride: 97 mmol/L — ABNORMAL LOW (ref 101–111)
Glucose, Bld: 572 mg/dL (ref 65–99)
HCT: 61 % — ABNORMAL HIGH (ref 36.0–46.0)
Hemoglobin: 20.7 g/dL — ABNORMAL HIGH (ref 12.0–15.0)
Potassium: 3.6 mmol/L (ref 3.5–5.1)
Sodium: 135 mmol/L (ref 135–145)
TCO2: 18 mmol/L (ref 0–100)

## 2015-11-25 LAB — URINALYSIS, ROUTINE W REFLEX MICROSCOPIC
Bilirubin Urine: NEGATIVE
KETONES UR: 15 mg/dL — AB
LEUKOCYTES UA: NEGATIVE
Nitrite: NEGATIVE
PH: 5 (ref 5.0–8.0)
Protein, ur: NEGATIVE mg/dL
Specific Gravity, Urine: >1.046 — ABNORMAL HIGH (ref 1.005–1.030)

## 2015-11-25 LAB — POCT ACTIVATED CLOTTING TIME: ACTIVATED CLOTTING TIME: 405 s

## 2015-11-25 LAB — CBG MONITORING, ED: GLUCOSE-CAPILLARY: 519 mg/dL — AB (ref 65–99)

## 2015-11-25 LAB — GLUCOSE, CAPILLARY: Glucose-Capillary: 479 mg/dL — ABNORMAL HIGH (ref 65–99)

## 2015-11-25 LAB — I-STAT ARTERIAL BLOOD GAS, ED
ACID-BASE DEFICIT: 18 mmol/L — AB (ref 0.0–2.0)
BICARBONATE: 14.8 meq/L — AB (ref 20.0–24.0)
O2 Saturation: 98 %
PO2 ART: 166 mmHg — AB (ref 80.0–100.0)
Patient temperature: 98.6
TCO2: 17 mmol/L (ref 0–100)
pCO2 arterial: 65.9 mmHg (ref 35.0–45.0)
pH, Arterial: 6.958 — CL (ref 7.350–7.450)

## 2015-11-25 LAB — MAGNESIUM: Magnesium: 1.9 mg/dL (ref 1.7–2.4)

## 2015-11-25 LAB — URINE MICROSCOPIC-ADD ON

## 2015-11-25 LAB — POCT I-STAT 3, ART BLOOD GAS (G3+)
ACID-BASE DEFICIT: 4 mmol/L — AB (ref 0.0–2.0)
BICARBONATE: 22.8 meq/L (ref 20.0–24.0)
O2 SAT: 100 %
PH ART: 7.273 — AB (ref 7.350–7.450)
PO2 ART: 478 mmHg — AB (ref 80.0–100.0)
Patient temperature: 98.6
TCO2: 24 mmol/L (ref 0–100)
pCO2 arterial: 49.3 mmHg — ABNORMAL HIGH (ref 35.0–45.0)

## 2015-11-25 LAB — CORTISOL: Cortisol, Plasma: 21.3 ug/dL

## 2015-11-25 LAB — I-STAT TROPONIN, ED: TROPONIN I, POC: 0.9 ng/mL — AB (ref 0.00–0.08)

## 2015-11-25 LAB — TRIGLYCERIDES: Triglycerides: 337 mg/dL — ABNORMAL HIGH (ref <150)

## 2015-11-25 LAB — PHOSPHORUS: Phosphorus: 4.4 mg/dL (ref 2.5–4.6)

## 2015-11-25 LAB — I-STAT CG4 LACTIC ACID, ED: LACTIC ACID, VENOUS: 13.53 mmol/L — AB (ref 0.5–2.0)

## 2015-11-25 LAB — BRAIN NATRIURETIC PEPTIDE: B Natriuretic Peptide: 404.1 pg/mL — ABNORMAL HIGH (ref 0.0–100.0)

## 2015-11-25 LAB — LACTIC ACID, PLASMA: LACTIC ACID, VENOUS: 1.9 mmol/L (ref 0.5–2.0)

## 2015-11-25 LAB — MRSA PCR SCREENING: MRSA BY PCR: POSITIVE — AB

## 2015-11-25 SURGERY — LEFT HEART CATH AND CORS/GRAFTS ANGIOGRAPHY

## 2015-11-25 MED ORDER — SODIUM CHLORIDE 0.9 % IV SOLN
25.0000 ug/h | INTRAVENOUS | Status: DC
  Filled 2015-11-25: qty 50

## 2015-11-25 MED ORDER — SODIUM CHLORIDE 0.9% FLUSH: 10.0000 mL | INTRAVENOUS | Status: DC | PRN

## 2015-11-25 MED ORDER — CHLORHEXIDINE GLUCONATE 0.12% ORAL RINSE (MEDLINE KIT)
15.0000 mL | Freq: Two times a day (BID) | OROMUCOSAL | Status: DC
  Administered 2015-11-25: 15 mL via OROMUCOSAL

## 2015-11-25 MED ORDER — SODIUM CHLORIDE 0.9 % IV SOLN
1.7500 mg/kg/h | INTRAVENOUS | Status: AC
  Administered 2015-11-25: 1.75 mg/kg/h via INTRAVENOUS
  Filled 2015-11-25: qty 250

## 2015-11-25 MED ORDER — IPRATROPIUM-ALBUTEROL 0.5-2.5 (3) MG/3ML IN SOLN
3.0000 mL | Freq: Four times a day (QID) | RESPIRATORY_TRACT | Status: DC
  Administered 2015-11-25 – 2015-11-27 (×6): 3 mL via RESPIRATORY_TRACT
  Filled 2015-11-25 (×6): qty 3

## 2015-11-25 MED ORDER — IOPAMIDOL (ISOVUE-370) INJECTION 76%
INTRAVENOUS | Status: AC
  Filled 2015-11-25: qty 100

## 2015-11-25 MED ORDER — SODIUM CHLORIDE 0.9 % IV SOLN
250.0000 mL | INTRAVENOUS | Status: DC | PRN
  Administered 2015-11-25: 250 mL via INTRAVENOUS

## 2015-11-25 MED ORDER — MUPIROCIN 2 % EX OINT
1.0000 "application " | TOPICAL_OINTMENT | Freq: Two times a day (BID) | CUTANEOUS | Status: DC
  Administered 2015-11-25 – 2015-11-29 (×8): 1 via NASAL
  Filled 2015-11-25 (×2): qty 22

## 2015-11-25 MED ORDER — FENTANYL BOLUS VIA INFUSION
50.0000 ug | INTRAVENOUS | Status: DC | PRN
  Filled 2015-11-25: qty 50

## 2015-11-25 MED ORDER — CISATRACURIUM BESYLATE (PF) 200 MG/20ML IV SOLN
1.0000 ug/kg/min | INTRAVENOUS | Status: DC
  Filled 2015-11-25: qty 20

## 2015-11-25 MED ORDER — LIDOCAINE HCL (PF) 1 % IJ SOLN
INTRAMUSCULAR | Status: DC | PRN
  Administered 2015-11-25: 20 mL via SUBCUTANEOUS

## 2015-11-25 MED ORDER — ARTIFICIAL TEARS OP OINT: 1.0000 "application " | TOPICAL_OINTMENT | Freq: Three times a day (TID) | OPHTHALMIC | Status: DC

## 2015-11-25 MED ORDER — ONDANSETRON HCL 4 MG/2ML IJ SOLN: 4.0000 mg | Freq: Four times a day (QID) | INTRAMUSCULAR | Status: DC | PRN

## 2015-11-25 MED ORDER — POTASSIUM CHLORIDE 10 MEQ/50ML IV SOLN
10.0000 meq | INTRAVENOUS | Status: AC
  Administered 2015-11-25 (×4): 10 meq via INTRAVENOUS
  Filled 2015-11-25 (×4): qty 50

## 2015-11-25 MED ORDER — TICAGRELOR 90 MG PO TABS: 180.0000 mg | ORAL_TABLET | Freq: Once | ORAL | Status: DC

## 2015-11-25 MED ORDER — PROPOFOL 1000 MG/100ML IV EMUL
0.0000 ug/kg/min | INTRAVENOUS | Status: DC
  Administered 2015-11-25: 5 ug/kg/min via INTRAVENOUS
  Filled 2015-11-25: qty 100

## 2015-11-25 MED ORDER — NOREPINEPHRINE BITARTRATE 1 MG/ML IV SOLN
0.0000 ug/min | Freq: Once | INTRAVENOUS | Status: AC
  Administered 2015-11-25: 5 ug/min via INTRAVENOUS
  Filled 2015-11-25: qty 4

## 2015-11-25 MED ORDER — FENTANYL CITRATE (PF) 100 MCG/2ML IJ SOLN
100.0000 ug | INTRAMUSCULAR | Status: AC | PRN
  Administered 2015-11-25 – 2015-11-26 (×3): 100 ug via INTRAVENOUS
  Filled 2015-11-25 (×3): qty 2

## 2015-11-25 MED ORDER — IOPAMIDOL (ISOVUE-370) INJECTION 76%
INTRAVENOUS | Status: DC | PRN
  Administered 2015-11-25: 110 mL

## 2015-11-25 MED ORDER — "THROMBI-PAD 3""X3"" EX PADS"
1.0000 | MEDICATED_PAD | Freq: Once | CUTANEOUS | Status: AC
  Administered 2015-11-26: 1 via TOPICAL
  Filled 2015-11-25: qty 1

## 2015-11-25 MED ORDER — FENTANYL CITRATE (PF) 100 MCG/2ML IJ SOLN
INTRAMUSCULAR | Status: DC | PRN
  Administered 2015-11-25: 25 ug via INTRAVENOUS

## 2015-11-25 MED ORDER — CISATRACURIUM BOLUS VIA INFUSION
0.0500 mg/kg | INTRAVENOUS | Status: DC | PRN
  Filled 2015-11-25: qty 5

## 2015-11-25 MED ORDER — SODIUM CHLORIDE 0.9 % IV SOLN: 2000.0000 mL | Freq: Once | INTRAVENOUS | Status: DC

## 2015-11-25 MED ORDER — LIDOCAINE HCL (PF) 1 % IJ SOLN
INTRAMUSCULAR | Status: AC
  Filled 2015-11-25: qty 30

## 2015-11-25 MED ORDER — BIVALIRUDIN BOLUS VIA INFUSION - CUPID
INTRAVENOUS | Status: DC | PRN
  Administered 2015-11-25: 75.75 mg via INTRAVENOUS

## 2015-11-25 MED ORDER — ASPIRIN 300 MG RE SUPP: 300.0000 mg | RECTAL | Status: DC

## 2015-11-25 MED ORDER — FENTANYL CITRATE (PF) 100 MCG/2ML IJ SOLN: 100.0000 ug | Freq: Once | INTRAMUSCULAR | Status: DC

## 2015-11-25 MED ORDER — FENTANYL CITRATE (PF) 100 MCG/2ML IJ SOLN
INTRAMUSCULAR | Status: AC
  Filled 2015-11-25: qty 2

## 2015-11-25 MED ORDER — BIVALIRUDIN 250 MG IV SOLR
INTRAVENOUS | Status: AC
  Filled 2015-11-25: qty 250

## 2015-11-25 MED ORDER — ACETAMINOPHEN 325 MG PO TABS: 650.0000 mg | ORAL_TABLET | ORAL | Status: DC | PRN

## 2015-11-25 MED ORDER — SODIUM CHLORIDE 0.9% FLUSH: 3.0000 mL | INTRAVENOUS | Status: DC | PRN

## 2015-11-25 MED ORDER — METOPROLOL TARTRATE 1 MG/ML IV SOLN
INTRAVENOUS | Status: AC
  Filled 2015-11-25: qty 5

## 2015-11-25 MED ORDER — ANTISEPTIC ORAL RINSE SOLUTION (CORINZ)
7.0000 mL | OROMUCOSAL | Status: DC
  Administered 2015-11-25 – 2015-11-26 (×8): 7 mL via OROMUCOSAL

## 2015-11-25 MED ORDER — SODIUM CHLORIDE 0.9 % IV BOLUS (SEPSIS): 1000.0000 mL | Freq: Once | INTRAVENOUS | Status: DC

## 2015-11-25 MED ORDER — INSULIN ASPART 100 UNIT/ML ~~LOC~~ SOLN: 0.0000 [IU] | SUBCUTANEOUS | Status: DC

## 2015-11-25 MED ORDER — SODIUM CHLORIDE 0.9 % IV SOLN: INTRAVENOUS | Status: AC

## 2015-11-25 MED ORDER — CISATRACURIUM BOLUS VIA INFUSION
0.1000 mg/kg | Freq: Once | INTRAVENOUS | Status: DC
  Filled 2015-11-25: qty 10

## 2015-11-25 MED ORDER — CHLORHEXIDINE GLUCONATE 0.12% ORAL RINSE (MEDLINE KIT)
15.0000 mL | Freq: Two times a day (BID) | OROMUCOSAL | Status: DC
  Administered 2015-11-25 – 2015-11-26 (×2): 15 mL via OROMUCOSAL

## 2015-11-25 MED ORDER — MIDAZOLAM BOLUS VIA INFUSION
2.0000 mg | INTRAVENOUS | Status: DC | PRN
  Filled 2015-11-25: qty 2

## 2015-11-25 MED ORDER — MIDAZOLAM HCL 2 MG/2ML IJ SOLN: 2.0000 mg | Freq: Once | INTRAMUSCULAR | Status: DC

## 2015-11-25 MED ORDER — ASPIRIN 300 MG RE SUPP
300.0000 mg | RECTAL | Status: AC
  Administered 2015-11-25: 300 mg via RECTAL
  Filled 2015-11-25: qty 1

## 2015-11-25 MED ORDER — METOPROLOL TARTRATE 1 MG/ML IV SOLN
INTRAVENOUS | Status: DC | PRN
  Administered 2015-11-25: 5 mg via INTRAVENOUS

## 2015-11-25 MED ORDER — "THROMBI-PAD 3""X3"" EX PADS": 1.0000 | MEDICATED_PAD | Freq: Once | CUTANEOUS | Status: DC

## 2015-11-25 MED ORDER — ROCURONIUM BROMIDE 50 MG/5ML IV SOLN
INTRAVENOUS | Status: AC | PRN
  Administered 2015-11-25: 100 mg via INTRAVENOUS

## 2015-11-25 MED ORDER — HEPARIN (PORCINE) IN NACL 2-0.9 UNIT/ML-% IJ SOLN
INTRAMUSCULAR | Status: DC | PRN
  Administered 2015-11-25: 1000 mL via INTRA_ARTERIAL

## 2015-11-25 MED ORDER — ALBUTEROL SULFATE HFA 108 (90 BASE) MCG/ACT IN AERS
8.0000 | INHALATION_SPRAY | RESPIRATORY_TRACT | Status: DC | PRN
Start: 2015-11-25 — End: 2015-11-25

## 2015-11-25 MED ORDER — NOREPINEPHRINE BITARTRATE 1 MG/ML IV SOLN
0.0000 ug/min | INTRAVENOUS | Status: DC
  Filled 2015-11-25: qty 4

## 2015-11-25 MED ORDER — PANTOPRAZOLE SODIUM 40 MG IV SOLR: 40.0000 mg | Freq: Every day | INTRAVENOUS | Status: DC

## 2015-11-25 MED ORDER — HYDROCORTISONE NA SUCCINATE PF 100 MG IJ SOLR
50.0000 mg | Freq: Four times a day (QID) | INTRAMUSCULAR | Status: DC
  Administered 2015-11-25 – 2015-11-26 (×3): 50 mg via INTRAVENOUS
  Filled 2015-11-25 (×3): qty 2

## 2015-11-25 MED ORDER — FENTANYL CITRATE (PF) 100 MCG/2ML IJ SOLN
100.0000 ug | INTRAMUSCULAR | Status: DC | PRN
  Administered 2015-11-25 – 2015-11-26 (×4): 100 ug via INTRAVENOUS
  Filled 2015-11-25 (×4): qty 2

## 2015-11-25 MED ORDER — ASPIRIN 81 MG PO CHEW
81.0000 mg | CHEWABLE_TABLET | Freq: Every day | ORAL | Status: DC
  Administered 2015-11-26 – 2015-11-29 (×4): 81 mg via ORAL
  Filled 2015-11-25 (×4): qty 1

## 2015-11-25 MED ORDER — SODIUM CHLORIDE 0.9% FLUSH
3.0000 mL | Freq: Two times a day (BID) | INTRAVENOUS | Status: DC
  Administered 2015-11-25 – 2015-11-27 (×5): 3 mL via INTRAVENOUS

## 2015-11-25 MED ORDER — ALBUTEROL SULFATE (2.5 MG/3ML) 0.083% IN NEBU: 2.5000 mg | INHALATION_SOLUTION | RESPIRATORY_TRACT | Status: DC | PRN

## 2015-11-25 MED ORDER — SODIUM CHLORIDE 0.9% FLUSH
10.0000 mL | Freq: Two times a day (BID) | INTRAVENOUS | Status: DC
  Administered 2015-11-26: 30 mL

## 2015-11-25 MED ORDER — ANTISEPTIC ORAL RINSE SOLUTION (CORINZ): 7.0000 mL | Freq: Four times a day (QID) | OROMUCOSAL | Status: DC

## 2015-11-25 MED ORDER — IPRATROPIUM BROMIDE 0.02 % IN SOLN
0.5000 mg | RESPIRATORY_TRACT | Status: DC | PRN
Start: 2015-11-25 — End: 2015-11-29

## 2015-11-25 MED ORDER — SODIUM CHLORIDE 0.9 % IV BOLUS (SEPSIS)
2000.0000 mL | Freq: Once | INTRAVENOUS | Status: AC
  Administered 2015-11-25: 1000 mL via INTRAVENOUS

## 2015-11-25 MED ORDER — PANTOPRAZOLE SODIUM 40 MG IV SOLR
40.0000 mg | Freq: Every day | INTRAVENOUS | Status: DC
  Administered 2015-11-25 – 2015-11-27 (×3): 40 mg via INTRAVENOUS
  Filled 2015-11-25 (×4): qty 40

## 2015-11-25 MED ORDER — DEXMEDETOMIDINE HCL IN NACL 200 MCG/50ML IV SOLN
0.4000 ug/kg/h | INTRAVENOUS | Status: DC
  Administered 2015-11-26: 0.4 ug/kg/h via INTRAVENOUS
  Administered 2015-11-26 (×2): 0.6 ug/kg/h via INTRAVENOUS
  Filled 2015-11-25 (×3): qty 50

## 2015-11-25 MED ORDER — MIDAZOLAM HCL 2 MG/2ML IJ SOLN
INTRAMUSCULAR | Status: AC
  Filled 2015-11-25: qty 2

## 2015-11-25 MED ORDER — TICAGRELOR 90 MG PO TABS
180.0000 mg | ORAL_TABLET | Freq: Once | ORAL | Status: AC
  Administered 2015-11-25: 180 mg via ORAL
  Filled 2015-11-25: qty 2

## 2015-11-25 MED ORDER — NITROGLYCERIN 1 MG/10 ML FOR IR/CATH LAB
INTRA_ARTERIAL | Status: AC
  Filled 2015-11-25: qty 10

## 2015-11-25 MED ORDER — SODIUM CHLORIDE 0.9 % IV SOLN
250.0000 mg | INTRAVENOUS | Status: DC | PRN
  Administered 2015-11-25 (×2): 1.75 mg/kg/h via INTRAVENOUS

## 2015-11-25 MED ORDER — EPINEPHRINE HCL 0.1 MG/ML IJ SOSY
PREFILLED_SYRINGE | INTRAMUSCULAR | Status: AC | PRN
  Administered 2015-11-25: 1 via INTRAVENOUS

## 2015-11-25 MED ORDER — CHLORHEXIDINE GLUCONATE CLOTH 2 % EX PADS
6.0000 | MEDICATED_PAD | Freq: Every day | CUTANEOUS | Status: DC
  Administered 2015-11-26 – 2015-11-29 (×4): 6 via TOPICAL

## 2015-11-25 MED ORDER — MIDAZOLAM HCL 2 MG/2ML IJ SOLN
INTRAMUSCULAR | Status: DC | PRN
  Administered 2015-11-25: 2 mg via INTRAVENOUS

## 2015-11-25 MED ORDER — SODIUM CHLORIDE 0.9 % IV SOLN
1.0000 mg/h | INTRAVENOUS | Status: DC
  Filled 2015-11-25: qty 10

## 2015-11-25 MED ORDER — SODIUM CHLORIDE 0.9 % IV SOLN
INTRAVENOUS | Status: DC
  Administered 2015-11-25: 3.2 [IU]/h via INTRAVENOUS
  Filled 2015-11-25: qty 2.5

## 2015-11-25 MED ORDER — ETOMIDATE 2 MG/ML IV SOLN
INTRAVENOUS | Status: AC | PRN
  Administered 2015-11-25: 20 mg via INTRAVENOUS

## 2015-11-25 MED ORDER — TICAGRELOR 90 MG PO TABS
90.0000 mg | ORAL_TABLET | Freq: Two times a day (BID) | ORAL | Status: DC
  Administered 2015-11-25 – 2015-11-29 (×8): 90 mg via ORAL
  Filled 2015-11-25 (×9): qty 1

## 2015-11-25 MED ORDER — HEPARIN (PORCINE) IN NACL 2-0.9 UNIT/ML-% IJ SOLN
INTRAMUSCULAR | Status: AC
  Filled 2015-11-25: qty 1500

## 2015-11-25 MED FILL — Medication: Qty: 1 | Status: AC

## 2015-11-25 SURGICAL SUPPLY — 17 items
BALLN EMERGE MR 2.5X12 (BALLOONS) ×2
BALLN ~~LOC~~ EMERGE MR 3.0X8 (BALLOONS) ×2
BALLOON EMERGE MR 2.5X12 (BALLOONS) ×1 IMPLANT
BALLOON ~~LOC~~ EMERGE MR 3.0X8 (BALLOONS) ×1 IMPLANT
CATH INFINITI 5FR ANG PIGTAIL (CATHETERS) ×2 IMPLANT
CATH INFINITI 5FR MULTPACK ANG (CATHETERS) ×2 IMPLANT
CATH VISTA GUIDE 6FR XBLAD3.5 (CATHETERS) ×2 IMPLANT
KIT ENCORE 26 ADVANTAGE (KITS) ×2 IMPLANT
KIT HEART LEFT (KITS) ×2 IMPLANT
PACK CARDIAC CATHETERIZATION (CUSTOM PROCEDURE TRAY) ×2 IMPLANT
SHEATH PINNACLE 6F 10CM (SHEATH) ×4 IMPLANT
STENT PROMUS PREM MR 2.75X12 (Permanent Stent) ×2 IMPLANT
SYR MEDRAD MARK V 150ML (SYRINGE) ×2 IMPLANT
TRANSDUCER W/STOPCOCK (MISCELLANEOUS) ×2 IMPLANT
TUBING CIL FLEX 10 FLL-RA (TUBING) ×2 IMPLANT
WIRE COUGAR XT STRL 190CM (WIRE) ×2 IMPLANT
WIRE EMERALD 3MM-J .035X150CM (WIRE) ×4 IMPLANT

## 2015-11-25 NOTE — Progress Notes (Signed)
eLink Physician-Brief Progress Note Patient Name: Sara Hobbs DOB: 06/16/1961 MRN: [id number]   Date of Service  11/25/2015  HPI/Events of Note  Multiple issues: 1. Nurse requests order for Thrombipad - arterial sheath site oozing and 2. Agitation with ventilator dyssynchrony.  eICU Interventions  Will oreder: 1. Thrombipad to arterial sheath site.  2. Precedex IV infusion. Titrate to RASS = 0 to -1.     Intervention Category Minor Interventions: Agitation / anxiety - evaluation and management  Lenell Antu 11/25/2015, 11:52 PM

## 2015-11-25 NOTE — Progress Notes (Signed)
Changes made per MD request after ABG results.

## 2015-11-25 NOTE — Progress Notes (Signed)
eLink Physician-Brief Progress Note Patient Name: Sara Hobbs DOB: 1960-11-08 MRN: [id number]   Date of Service  11/25/2015  HPI/Events of Note  RN calls about hyperglycemia at 400mg + (pt on insulin at home).  RN calls about abg results with 7.27/49/478  eICU Interventions  Will start insulin sliding scale >> if not corrected, will start insulin drip.  Will increase rate and dec Fio2. Rpt abg post 1 hr. RT to call     Intervention Category Intermediate Interventions: Other:  Louann Sjogren 11/25/2015, 6:50 PM

## 2015-11-25 NOTE — Progress Notes (Signed)
Responded to page to support patients husband and escort him to consultation room. Patient was brought to ED by husband via car.  Patient coded and was intubated. Patient going to cath Lab. Husband said that patient had been having pain for about 2-3 days. Patient has one daughter in [place] ,[place]. and one daughter here in [place] who lives with parents. I provided hospitality, empathetic listening, ministry of presence and information sharing between patient husband and staff.    11/25/15 1400  Clinical Encounter Type  Visited With Family;Patient not available;Health care provider  Visit Type Initial;Spiritual support;Pre-op;ED;Trauma  Referral From Nurse  Spiritual Encounters  Spiritual Needs Emotional  Stress Factors  Family Stress Factors Exhausted;Health changes  Venida Jarvis, [place] [phone]

## 2015-11-25 NOTE — Consult Note (Signed)
INTERVENTIONAL CARDIOLOGY CONSULT NOTE  Patient ID: Sara Hobbs, MRN: [id number], DOB/AGE: September 20, 1960 55 y.o. Admit date: 11/25/2015 Date of Consult: 11/25/2015  Primary Physician: CLOWARD,DAVIS L, MD Primary Cardiologist: Dr Folk Referring Physician: Dr Knott  Chief Complaint: shortness of breath Reason for Consultation: cardiac arrest  HPI: 55 year old woman with known coronary artery disease. She had CABG in 2016 with a LIMA to LAD and saphenous vein graft to PDA. She had had previous stenting to the circumflex in 2015. The patient is generally not in good health. She has chronic pain and uses a fentanyl patch. She is ambulatory not active. She has been complaining of chest pain last night and took nitroglycerin. This morning she felt bad and complained of weakness and shortness of breath. She was walking to her car and was unsteady on her feet. Her husband got her into the car and drove to the emergency room here. Upon entering the emergency room the patient collapsed. She required CPR for resuscitation. Her initial rhythm was PEA. After CPR and ACLS she regained a pulse. She did not require defibrillation. We are called to see her in the emergency room for consideration of emergent cardiac catheterization.  The patient has been having episodic chest pain per her husband's report. She continues to smoke cigarettes. She has diabetes that has been reasonably well controlled. She has not had edema, orthopnea, or PND. All history is obtained from the patient's husband.  Medical History:  Past Medical History  Diagnosis Date  . Chronic shoulder pain     "right; I've got bad rotator cuff"  . COPD (chronic obstructive pulmonary disease) (HCC)   . CAD, multiple vessel   . Hyperlipidemia   . GERD (gastroesophageal reflux disease)   . Chest tightness   . Angina pectoris (HCC)   . Wears glasses   . Anxiety   . Depression   . History of hiatal hernia   . 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 Myocardial infarction (HCC) 11/2013    PCI w/ drug eluting stent LCx  . Asthma   . Hypothyroidism     PMH:  . IDDM (insulin dependent diabetes mellitus) (HCC)   . Diabetes mellitus with complication (HCC)   . Neuropathy due to secondary diabetes (HCC)   . History of blood transfusion     "when I had my 1st youngun; when I had MI"  . History of stomach ulcers   . Headache     "monthly" (11/22/2014)  . Arthritis     "hands, neck, back, knees, hips, ankles" (11/22/2014)  . Chronic lowMaisiNorthwest Ohio Psyc467 JMaisiRockledge Regiona985MaisiBon Secours Surgery Center At Harbour View LLCMaisiSpecialty SuMaisiSt. Luke'S RehabiMaisiNorth Platte Su79 CMaisiJohnston M[address]708 HamiltKentu29mckZella MaisiMercy MediMaisiTri-State M8MaisiSt VincentMaisiEye [facility] An549MaisiNovant Health Ballantyne Ou6MaisiMaisiDwight D. Eisenhower VMaisiFaulkMaisiJacobson Memorial Hospit9914 SwansonKentucky AbbeHarlaOneta RackenterlKen59mtuZella Maisie Fu829TNorth Dakotah19Jonette Evadore De83marEve al History:  Past Surgical History  Procedure Laterality Date  . Cesarean section  1982; 1984  . Gastric resection  1990's    bezoar; "for reflux"  . Coronary angioplasty with stent placement  11/11/2013    Drug-eluting stent in LCx  . Breast surgery Left 1990's    breast duct surgery  . Colonoscopy    . Esophagogastroduodenoscopy    . Coronary artery bypass graft N/A 09/22/2014    Procedure: CORONARY ARTERY BYPASS GRAFTING (CABG);  Surgeon: Clarence H Owen, MD;  Location: [facility] OR;  Service: Open Heart Surgery;  Laterality: N/A;  Times 2 using left internal mammary artery  and endoscopically harvested right saphenous vein  . Tee without cardioversion N/A 09/22/2014    Procedure: TRANSESOPHAGEAL ECHOCARDIOGRAM (TEE);  Surgeon: Purcell Nails, MD;  Location: [facility] OR;  Service: Open Heart Surgery;  Laterality: N/A;  . Laparoscopic cholecystectomy  2000  . Abdominal hysterectomy  1990's  . Cardiac catheterization  09/01/14  . Sternal wound debridement N/A 11/23/2014    Procedure: SUPERFICIAL STERNAL WOUND DEBRIDEMENT;  Surgeon: Purcell Nails, MD;  Location: [facility] OR;  Service: Thoracic;  Laterality: N/A;  . Application of wound vac N/A 11/23/2014    Procedure: POSSIBLE APPLICATION OF WOUND VAC;  Surgeon: Purcell Nails,  MD;  Location: [facility] OR;  Service: Thoracic;  Laterality: N/A;     Home Meds: Prior to Admission medications   Medication Sig Start Date End Date Taking? Authorizing Provider  albuterol (PROVENTIL HFA;VENTOLIN HFA) 108 (90 BASE) MCG/ACT inhaler Inhale 2 puffs into the lungs every 6 (six) hours as needed for wheezing or shortness of breath.    Historical Provider, MD  aspirin EC 81 MG tablet Take 81 mg by mouth daily.    Historical Provider, MD  atorvastatin (LIPITOR) 80 MG tablet Take 80 mg by mouth daily.    Historical Provider, MD  carvedilol (COREG) 3.125 MG tablet Take 1 tablet (3.125 mg total) by mouth 2 (two) times daily with a meal. 11/26/14   Donielle Margaretann Loveless, PA-C  fenofibrate (TRICOR) 145 MG tablet Take 145 mg by mouth daily.    Historical Provider, MD  Fluticasone-Salmeterol (ADVAIR) 100-50 MCG/DOSE AEPB Inhale 1 puff into the lungs 2 (two) times daily.    Historical Provider, MD  glucose blood (ONE TOUCH ULTRA TEST) test strip Use as instructed to check blood sugar 2 times per day. Dx code E11.8 07/25/15   Reather Littler, MD  insulin glargine (LANTUS) 100 UNIT/ML injection Inject into the skin 2 (two) times daily. Inject 50 units in am and 40 units in pm    Historical Provider, MD  insulin lispro (HUMALOG KWIKPEN) 100 UNIT/ML KiwkPen 6-8 units before supper or large meals 04/25/15   Reather Littler, MD  insulin regular human CONCENTRATED (HUMULIN R) 500 UNIT/ML kwikpen Inject 35 units in am and 30 units in pm 07/25/15   Reather Littler, MD  lisinopril (PRINIVIL,ZESTRIL) 2.5 MG tablet Take 1 tablet (2.5 mg total) by mouth daily. 11/29/14   Donielle Margaretann Loveless, PA-C  loperamide (IMODIUM) 2 MG capsule Take 1 capsule (2 mg total) by mouth 4 (four) times daily as needed for diarrhea or loose stools. Patient not taking: Reported on 07/25/2015 06/01/15   Cheri Fowler, PA-C  metFORMIN (GLUCOPHAGE-XR) 500 MG 24 hr tablet 2 tablets at supper 07/25/15   Reather Littler, MD  metoCLOPramide (REGLAN) 10 MG tablet Take 10  mg by mouth 4 (four) times daily.    Historical Provider, MD  morphine (MSIR) 15 MG tablet Take 15 mg by mouth every 12 (twelve) hours.    Historical Provider, MD  nitroGLYCERIN (NITROSTAT) 0.4 MG SL tablet Place 0.4 mg under the tongue.    Historical Provider, MD  ondansetron (ZOFRAN) 4 MG tablet Take 1 tablet (4 mg total) by mouth every 6 (six) hours. Patient not taking: Reported on 07/25/2015 06/01/15   Cheri Fowler, PA-C  [facility] DELICA LANCETS 33G MISC Use to check blood sugar 2 times per day dx code E11.8 07/25/15   Reather Littler, MD  Oxymorphone HCl, Crush Resist, 20 MG T12A Take by mouth every 12 (twelve) hours.    Historical Provider,  MD  promethazine (PHENERGAN) 25 MG tablet Take 1 tablet (25 mg total) by mouth every 8 (eight) hours as needed for nausea. 12/02/14   Purcell Nails, MD  ticagrelor (BRILINTA) 90 MG TABS tablet Take by mouth 2 (two) times daily.    Historical Provider, MD  tiotropium (SPIRIVA) 18 MCG inhalation capsule Place 18 mcg into inhaler and inhale daily.    Historical Provider, MD    Inpatient Medications:  . aspirin  300 mg Rectal NOW      Allergies:  Allergies  Allergen Reactions  . Wellbutrin [Bupropion] Other (See Comments)    SEVERE CHEST PAIN    Social History   Social History  . Marital Status: Married    Spouse Name: N/A  . Number of Children: N/A  . Years of Education: N/A   Occupational History  . Not on file.   Social History Main Topics  . Smoking status: Current Every Day Smoker -- 2.00 packs/day for 41 years    Types: Cigarettes  . Smokeless tobacco: Former Neurosurgeon    Types: Chew    Quit date: 09/13/2005  . Alcohol Use: No  . Drug Use: No  . Sexual Activity: Yes   Other Topics Concern  . Not on file   Social History Narrative     Family History  Problem Relation Age of Onset  . Adopted: Yes  . Family history unknown: Yes     Review of Systems: Unable to obtain-the patient is unconscious  Physical Exam: Blood pressure  110/67, pulse 118, resp. rate 15, SpO2 99 %. Pt is intubated and sedated HEENT: normal Neck: JVP normal. Carotid upstrokes normal without bruits. No thyromegaly. Lungs: equal expansion, clear bilaterally CV: Apex is nonpalpable, tachycardic and regular without murmur or gallop Abd: soft, nondistended, positive bowel sounds Back: Midline Ext: no C/C/E Skin: warm and dry without rash Neuro: Unconscious, nonresponsive     Labs: No results for input(s): CKTOTAL, CKMB, TROPONINI in the last 72 hours. Lab Results  Component Value Date   WBC 20.7* 11/25/2015   HGB 20.7* 11/25/2015   HCT 61.0* 11/25/2015   MCV 92.7 11/25/2015   PLT 228 11/25/2015    Recent Labs Lab 11/25/15 1410  NA 135  K 3.6  CL 97*  BUN 13  CREATININE 0.80  GLUCOSE 572*   Lab Results  Component Value Date   CHOL 104 07/21/2015   HDL 27.80* 07/21/2015   LDLCALC 44 07/21/2015   TRIG 162.0* 07/21/2015   No results found for: DDIMER  Radiology/Studies:  Dg Chest Port 1 View  11/25/2015  CLINICAL DATA:  Cardiac arrest.  Endotracheal tube placement. EXAM: PORTABLE CHEST 1 VIEW COMPARISON:  06/01/2015 FINDINGS: External defibrillator pads overlie the chest. Endotracheal tube terminates approximately 3 cm above the carina. Enteric tube courses into the left upper abdomen. The cardiac silhouette is upper limits of normal in size. There is pulmonary vascular congestion without overt alveolar edema, segmental consolidation, sizeable pleural effusion, or pneumothorax. Sequelae of prior CABG are identified, and right upper quadrant abdominal surgical clips are noted. No acute osseous abnormality is identified. IMPRESSION: 1. Support devices as above. 2. Pulmonary vascular congestion. Electronically Signed   By: Sebastian Ache M.D.   On: 11/25/2015 14:38    EKG: Sinus tachycardia with wide QRS nonspecific IVCD  Cardiac Studies: Pending  ASSESSMENT AND PLAN:  55 year old woman, known CAD status post CABG, presenting  with increasing anginal symptoms and now with PEA cardiac arrest. She has been resuscitated and  we are taking her emergently for cardiac catheterization. Her EKG is nondiagnostic because of the wide QRS complex. Considering her acute decompensation, known CAD, and increasing chest pain, it seems reasonable to take her for emergent cath. Her prognosis is very guarded as she is in critical condition with a pH of approximately 7.0 and the blood lactate of 13.5. I have discussed the situation with her husband who would like [place] to do everything we can.  SignedTonny Bollman MD, [facility] 11/25/2015, 2:44 PM

## 2015-11-25 NOTE — Progress Notes (Signed)
eLink Physician-Brief Progress Note Patient Name: Sara Hobbs DOB: 02-12-1961 MRN: [id number]   Date of Service  11/25/2015  HPI/Events of Note  ABG on 40%/PRVC 28/TV 430/P 6 = 7.399/29.5/141/17.8  eICU Interventions  Continue current ventilator management.      Intervention Category Major Interventions: Respiratory failure - evaluation and management  Sommer,Steven Dennard Nip 11/25/2015, 9:44 PM

## 2015-11-25 NOTE — Procedures (Signed)
Intubation Procedure Note Sara Hobbs [id number] 03/17/1961  Procedure: Intubation Indications: Airway protection and maintenance   Procedure Details Consent: Risks of procedure as well as the alternatives and risks of each were explained to the (patient/caregiver).  Consent for procedure obtained. Time Out: Verified patient identification, verified procedure, site/side was marked, verified correct patient position, special equipment/implants available, medications/allergies/relevent history reviewed, required imaging and test results available.  Performed  Maximum sterile technique was used including antiseptics, gloves, gown, hand hygiene and mask.  MAC and 3    Evaluation Hemodynamic Status: BP stable throughout; O2 sats: stable throughout Patient's Current Condition: stable Complications: No apparent complications Patient did tolerate procedure well. Chest X-ray ordered to verify placement.  CXR: pending.   [name] 11/25/2015

## 2015-11-25 NOTE — ED Provider Notes (Signed)
CSN: [id number]     Arrival date & time 11/25/15  1351 History   First MD Initiated Contact with Patient 11/25/15 1434     Chief Complaint: Chest Pain    HPI  55 y.o.  female with extensive medical history including COPD, CAD with 2 prior bypass surgeries as well as stents in place, insulin-dependent diabetes, hyperlipidemia, severe osteoarthritis, who presents unresponsive. Per the patient's husband, the patient was complaining of chest pain this morning. On arrival to triage the patient's was verbal though somnolent, and became unresponsive while being wheeled back into the ED. CPR was started immediately. Patient was noted to have agonal respirations and breaths were delivered by bag valve mask. No further history was able to be obtained secondary to acuity of the patient's medical condition.    Past Medical History  Diagnosis Date  . Chronic shoulder pain     "right; I've got bad rotator cuff"  . COPD (chronic obstructive pulmonary disease) (HCC)   . CAD, multiple vessel   . Hyperlipidemia   . GERD (gastroesophageal reflux disease)   . Chest tightness   . Angina pectoris (HCC)   . Wears glasses   . Anxiety   . Depression   . History of hiatal hernia   . 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 Myocardial infarction (HCC) 11/2013    PCI w/ drug eluting stent LCx  . Asthma   . Hypothyroidism     PMH:  . IDDM (insulin dependent diabetes mellitus) (HCC)   . Diabetes mellitus with complication (HCC)   . Neuropathy due to secondary diabetes (HCC)   . History of blood transfusion     "when I had my 1st youngun; when I had MI"  . History of stomach ulcers   . Headache     "monthly" (11/22/2014)  . Arthritis     "hands, neck, back, knees, hips, ankles" (11/22/2014)  . Chronic lower back pain   . Incidental lung nodule, > 3mm and < 8mm 11/22/2014    Small non-calcified nodules left lung   Past Surgical History  Procedure Laterality Date  .  Cesarean section  1982; 1984  . Gastric resection  1990's    bezoar; "for reflux"  . Coronary angioplasty with stent placement  11/11/2013    Drug-eluting stent in LCx  . Breast surgery Left 1990's    breast duct surgery  . Colonoscopy    . Esophagogastroduodenoscopy    . Coronary artery bypass graft N/A 09/22/2014    Procedure: CORONARY ARTERY BYPASS GRAFTING (CABG);  Surgeon: Purcell Nails, MD;  Location: [facility] OR;  Service: Open Heart Surgery;  Laterality: N/A;  Times 2 using left internal mammary artery and endoscopically harvested right saphenous vein  . Tee without cardioversion N/A 09/22/2014    Procedure: TRANSESOPHAGEAL ECHOCARDIOGRAM (TEE);  Surgeon: Purcell Nails, MD;  Location: [facility] OR;  Service: Open Heart Surgery;  Laterality: N/A;  . Laparoscopic cholecystectomy  2000  . Abdominal hysterectomy  1990's  . Cardiac catheterization  09/01/14  . Sternal wound debridement N/A 11/23/2014    Procedure: SUPERFICIAL STERNAL WOUND DEBRIDEMENT;  Surgeon: Purcell Nails, MD;  Location: [facility] OR;  Service: Thoracic;  Laterality: N/A;  . Application of wound vac N/A 11/23/2014    Procedure: POSSIBLE APPLICATION OF WOUND VAC;  Surgeon: Purcell Nails, MD;  Location: [facility] OR;  Service: Thoracic;  Laterality: N/A;   Family History  Problem Relation Age of Onset  . Adopted: Yes  . Family history unknown: Yes   Social History  Substance Use Topics  . Smoking status: Current Every Day Smoker -- 2.00 packs/day for 41 years    Types: Cigarettes  . Smokeless tobacco: Former Neurosurgeon    Types: Chew    Quit date: 09/13/2005  . Alcohol Use: No   OB History    No data available     Review of Systems  Unable to perform ROS: Acuity of condition      Allergies  Wellbutrin  Home Medications   Prior to Admission medications   Medication Sig Start Date End Date Taking? Authorizing Provider  albuterol (PROVENTIL HFA;VENTOLIN HFA) 108 (90 BASE) MCG/ACT inhaler Inhale 2 puffs into the lungs every 6  (six) hours as needed for wheezing or shortness of breath.    Historical Provider, MD  aspirin EC 81 MG tablet Take 81 mg by mouth daily.    Historical Provider, MD  atorvastatin (LIPITOR) 80 MG tablet Take 80 mg by mouth daily.    Historical Provider, MD  carvedilol (COREG) 3.125 MG tablet Take 1 tablet (3.125 mg total) by mouth 2 (two) times daily with a meal. 11/26/14   Donielle Margaretann Loveless, PA-C  fenofibrate (TRICOR) 145 MG tablet Take 145 mg by mouth daily.    Historical Provider, MD  Fluticasone-Salmeterol (ADVAIR) 100-50 MCG/DOSE AEPB Inhale 1 puff into the lungs 2 (two) times daily.    Historical Provider, MD  glucose blood (ONE TOUCH ULTRA TEST) test strip Use as instructed to check blood sugar 2 times per day. Dx code E11.8 07/25/15   Reather Littler, MD  insulin glargine (LANTUS) 100 UNIT/ML injection Inject into the skin 2 (two) times daily. Inject 50 units in am and 40 units in pm    Historical Provider, MD  insulin lispro (HUMALOG KWIKPEN) 100 UNIT/ML KiwkPen 6-8 units before supper or large meals 04/25/15   Reather Littler, MD  insulin regular human CONCENTRATED (HUMULIN R) 500 UNIT/ML kwikpen Inject 35 units in am and 30 units in pm 07/25/15   Reather Littler, MD  lisinopril (PRINIVIL,ZESTRIL) 2.5 MG tablet Take 1 tablet (2.5 mg total) by mouth daily. 11/29/14   Donielle Margaretann Loveless, PA-C  loperamide (IMODIUM) 2 MG capsule Take 1 capsule (2 mg total) by mouth 4 (four) times daily as needed for diarrhea or loose stools. Patient not taking: Reported on 07/25/2015 06/01/15   Cheri Fowler, PA-C  metFORMIN (GLUCOPHAGE-XR) 500 MG 24 hr tablet 2 tablets at supper 07/25/15   Reather Littler, MD  metoCLOPramide (REGLAN) 10 MG tablet Take 10 mg by mouth 4 (four) times daily.    Historical Provider, MD  morphine (MSIR) 15 MG tablet Take 15 mg by mouth every 12 (twelve) hours.    Historical Provider, MD  nitroGLYCERIN (NITROSTAT) 0.4 MG SL tablet Place 0.4 mg under the tongue.    Historical Provider, MD  ondansetron  (ZOFRAN) 4 MG tablet Take 1 tablet (4 mg total) by mouth every 6 (six) hours. Patient not taking: Reported on 07/25/2015 06/01/15   Cheri Fowler, PA-C  [facility] DELICA LANCETS 33G MISC Use to check blood sugar 2 times per day dx code E11.8 07/25/15   Reather Littler, MD  Oxymorphone HCl, Crush Resist, 20 MG T12A Take by mouth every 12 (twelve) hours.    Historical Provider, MD  promethazine (PHENERGAN) 25 MG tablet Take 1 tablet (25 mg total) by mouth every 8 (eight) hours as needed for nausea. 12/02/14  Purcell Nails, MD  ticagrelor (BRILINTA) 90 MG TABS tablet Take by mouth 2 (two) times daily.    Historical Provider, MD  tiotropium (SPIRIVA) 18 MCG inhalation capsule Place 18 mcg into inhaler and inhale daily.    Historical Provider, MD   BP 110/67 mmHg  Pulse 118  Resp 15  SpO2 99% Physical Exam  Constitutional:  Obese, unresponsive  HENT:  Head: Normocephalic and atraumatic.  Eyes:  Pupils 3mm and sluggishly reactive bilaterally  Neck: No tracheal deviation present.  Cardiovascular:  After ROSC, tachycardic with a regular rhythm   Pulmonary/Chest:  Agonal respirations  Abdominal: Soft. She exhibits no distension.  Musculoskeletal:  No gross trauma  Neurological:  Somnolent  Skin:  Cool, dry    ED Course  .Intubation Date/Time: 11/25/2015 4:37 PM Performed by: Charlie Pitter Authorized by: Charlie Pitter Consent: The procedure was performed in an emergent situation. Indications: respiratory failure and  airway protection Intubation method: video-assisted Patient status: paralyzed (RSI) Preoxygenation: BVM Sedatives: etomidate Paralytic: rocuronium Laryngoscope size: Mac 3 Tube size: 7.5 mm Tube type: cuffed Number of attempts: 1 Cords visualized: yes Post-procedure assessment: chest rise,  CO2 detector and ETCO2 monitor Breath sounds: equal and absent over the epigastrium Cuff inflated: yes ETT to lip: 23 cm Tube secured with: ETT holder Chest x-ray  interpreted by me, other physician and radiologist. Chest x-ray findings: endotracheal tube in appropriate position Patient tolerance: Patient tolerated the procedure well with no immediate complications   (including critical care time) Labs Review Labs Reviewed  CBC WITH DIFFERENTIAL/PLATELET - Abnormal; Notable for the following:    WBC 20.7 (*)    RBC 5.75 (*)    Hemoglobin 18.5 (*)    HCT 53.3 (*)    All other components within normal limits  CBG MONITORING, ED - Abnormal; Notable for the following:    Glucose-Capillary 519 (*)    All other components within normal limits  I-STAT CG4 LACTIC ACID, ED - Abnormal; Notable for the following:    Lactic Acid, Venous 13.53 (*)    All other components within normal limits  I-STAT TROPOININ, ED - Abnormal; Notable for the following:    Troponin i, poc 0.90 (*)    All other components within normal limits  I-STAT CHEM 8, ED - Abnormal; Notable for the following:    Chloride 97 (*)    Glucose, Bld 572 (*)    Hemoglobin 20.7 (*)    HCT 61.0 (*)    All other components within normal limits  I-STAT ARTERIAL BLOOD GAS, ED - Abnormal; Notable for the following:    pH, Arterial 6.958 (*)    pCO2 arterial 65.9 (*)    pO2, Arterial 166.0 (*)    Bicarbonate 14.8 (*)    Acid-base deficit 18.0 (*)    All other components within normal limits  CULTURE, BLOOD (ROUTINE X 2)  CULTURE, BLOOD (ROUTINE X 2)  URINE CULTURE  BLOOD GAS, ARTERIAL  APTT  PROTIME-INR  COMPREHENSIVE METABOLIC PANEL  URINALYSIS, ROUTINE W REFLEX MICROSCOPIC (NOT AT [facility]  BRAIN NATRIURETIC PEPTIDE  CBG MONITORING, ED    Imaging Review No results found. I have personally reviewed and evaluated these images and lab results as part of my medical decision-making.   EKG Interpretation   Date/Time:  Friday November 25 2015 13:53:45 EDT Ventricular Rate:  126 PR Interval:  131 QRS Duration: 157 QT Interval:  334 QTC Calculation: 484 R Axis:   117 Text  Interpretation:  Sinus tachycardia  RBBB and LPFB Abnormal inferior Q  waves right bundle new since previous Confirmed by KNOTT MD, DANIEL  [phone]) on 11/25/2015 2:01:09 PM      MDM   Final diagnoses:  Cardiac arrest (HCC)    CPR was initiated immediately following cardiac arrest. After 2 rounds of CPR and epinephrine the patient regained spontaneous circulation. She was intubated for agonal respirations according to the procedure note above. Arterial blood gas reveals severe mixed respiratory and metabolic acidosis with pH of 6.958. Lactic acidosis to 13.53. EKG reveals a new RBBB and LPFB with inferior Q waves. I-STAT troponin elevated to 0.9. Chest x-ray with pulmonary vascular congestion.  After intubation, patient became hypotensive with blood pressure of 80/50. No tension pneumothorax on ultrasound or chest x-ray. Levophed drip initiated with improvement in blood pressure. Cardiology consultated and evaluated the patient at bedside. Patient will be brought to the Cath Lab for emergent cardiac catheterization. Suspect that her cardiac arrest is secondary to an underlying ischemic event.    Jenifer Ernestina Penna, MD 11/25/15 1638  Lyndal Pulley, MD 11/25/15 7412  Lyndal Pulley, MD 11/25/15 [phone]

## 2015-11-25 NOTE — H&P (Signed)
PULMONARY / CRITICAL CARE MEDICINE   Name: Sara Hobbs MRN: [id number] DOB: [birth year]    ADMISSION DATE:  11/25/2015 CONSULTATION DATE:  11/25/2015  REFERRING MD:  EDP  CHIEF COMPLAINT:  PEA  HISTORY OF PRESENT ILLNESS:   55 year old female diabetic, HTN, smoker with previous CABG 2 years ago who has not done well since the surgery and has been essentially bed ridden who started complaining of chest pain and started looking pale.  Husband placed her in his car and drove to the ED where she had a PEA cardiac arrest, brief downtime with ROSC with one epi and CPR.  The patient never regained consciousness and was intubated in the ED.  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  PAST SURGICAL HISTORY: She  has past surgical history that includes Cesarean section (1982; 1984); Gastric resection (1990's); Coronary angioplasty with stent (11/11/2013); Breast surgery (Left, 1990's); Colonoscopy; Esophagogastroduodenoscopy; Coronary artery bypass graft (N/A, 09/22/2014); TEE without cardioversion (N/A, 09/22/2014); Laparoscopic cholecystectomy (2000); Abdominal hysterectomy (1990's); Cardiac catheterization (09/01/14); Sternal wound debridement (N/A, 11/23/2014); and Application if wound vac (N/A, 11/23/2014).  Allergies   Allergen Reactions  . Wellbutrin [Bupropion] Other (See Comments)    SEVERE CHEST PAIN    No current facility-administered medications on file prior to encounter.   Current Outpatient Prescriptions on File Prior to Encounter  Medication Sig  . albuterol (PROVENTIL HFA;VENTOLIN HFA) 108 (90 BASE) MCG/ACT inhaler Inhale 2 puffs into the lungs every 6 (six) hours as needed for wheezing or shortness of breath.  Marland Kitchen aspirin EC 81 MG tablet Take 81 mg by mouth daily.  Marland Kitchen atorvastatin (LIPITOR) 80 MG tablet Take 80 mg by mouth daily.  . carvedilol (COREG) 3.125 MG tablet Take 1 tablet (3.125 mg total) by mouth 2 (two) times daily with a meal.  . fenofibrate (TRICOR) 145 MG tablet Take 145 mg by mouth daily.  . Fluticasone-Salmeterol (ADVAIR) 100-50 MCG/DOSE AEPB Inhale 1 puff into the lungs 2 (two) times daily.  Marland Kitchen glucose blood (ONE TOUCH ULTRA TEST) test strip Use as instructed to check blood sugar 2 times per day. Dx code E11.8  . insulin glargine (LANTUS) 100 UNIT/ML injection Inject into the skin 2 (two) times daily. Inject 50 units in am and 40 units in pm  . insulin lispro (HUMALOG KWIKPEN) 100 UNIT/ML KiwkPen 6-8 units before supper or large meals  . insulin regular human CONCENTRATED (HUMULIN R) 500 UNIT/ML kwikpen Inject 35 units in am and 30 units in pm  . lisinopril (PRINIVIL,ZESTRIL) 2.5 MG tablet Take 1 tablet (2.5 mg total) by mouth daily.  Marland Kitchen loperamide (IMODIUM) 2 MG capsule Take 1 capsule (2 mg total) by mouth 4 (four) times daily as needed for diarrhea or loose stools. (Patient not taking: Reported on 07/25/2015)  . metFORMIN (GLUCOPHAGE-XR) 500 MG 24 hr tablet 2 tablets at supper  . metoCLOPramide (REGLAN) 10 MG tablet Take 10 mg by mouth 4 (four) times daily.  Marland Kitchen morphine (MSIR)  15 MG tablet Take 15 mg by mouth every 12 (twelve) hours.  . nitroGLYCERIN (NITROSTAT) 0.4 MG SL tablet Place 0.4 mg under the tongue.  . ondansetron (ZOFRAN) 4 MG tablet Take 1 tablet (4 mg total) by mouth  every 6 (six) hours. (Patient not taking: Reported on 07/25/2015)  . ONETOUCH DELICA LANCETS 33G MISC Use to check blood sugar 2 times per day dx code E11.8  . Oxymorphone HCl, Crush Resist, 20 MG T12A Take by mouth every 12 (twelve) hours.  . promethazine (PHENERGAN) 25 MG tablet Take 1 tablet (25 mg total) by mouth every 8 (eight) hours as needed for nausea.  . ticagrelor (BRILINTA) 90 MG TABS tablet Take by mouth 2 (two) times daily.  Marland Kitchen tiotropium (SPIRIVA) 18 MCG inhalation capsule Place 18 mcg into inhaler and inhale daily.    FAMILY HISTORY:  Her is adopted.  SOCIAL HISTORY: She  reports that she has been smoking Cigarettes.  She has a 82 pack-year smoking history. She quit smokeless tobacco use about 10 years ago. Her smokeless tobacco use included Chew. She reports that she does not drink alcohol or use illicit drugs.  REVIEW OF SYSTEMS:   Unattainable, sedated and intubated.  SUBJECTIVE:  No events since arrest.  VITAL SIGNS: BP 110/67 mmHg  Pulse 118  Resp 15  SpO2 100%  HEMODYNAMICS:    VENTILATOR SETTINGS:    INTAKE / OUTPUT:    PHYSICAL EXAMINATION: General:  Chronically ill appearing female, sedated, intubated and paralyzed. Neuro:  Unable to assess, paralyzed. HEENT:  /AT, pupils are non-reactive, MMM. Cardiovascular:  RRR, Nl S1/S2, -M/R/G. Lungs:  End exp wheezes noted. Abdomen:  Soft, NT, ND and +BS. Musculoskeletal:  -edema and -tenderness. Skin:  Mottled  LABS:  BMET  Recent Labs Lab 11/25/15 1400 11/25/15 1410  NA 132* 135  K 3.6 3.6  CL 94* 97*  CO2 16*  --   BUN 11 13  CREATININE 1.19* 0.80  GLUCOSE 606* 572*    Electrolytes  Recent Labs Lab 11/25/15 1400  CALCIUM 9.3    CBC  Recent Labs Lab 11/25/15 1400 11/25/15 1410  WBC 20.7*  --   HGB 18.5* 20.7*  HCT 53.3* 61.0*  PLT 228  --     Coag's No results for input(s): APTT, INR in the last 168 hours.  Sepsis Markers  Recent Labs Lab 11/25/15 1411   LATICACIDVEN 13.53*    ABG  Recent Labs Lab 11/25/15 1429  PHART 6.958*  PCO2ART 65.9*  PO2ART 166.0*    Liver Enzymes  Recent Labs Lab 11/25/15 1400  AST 262*  ALT 204*  ALKPHOS 164*  BILITOT 0.7  ALBUMIN 4.5    Cardiac Enzymes No results for input(s): TROPONINI, PROBNP in the last 168 hours.  Glucose  Recent Labs Lab 11/25/15 1355  GLUCAP 519*    Imaging Dg Chest Port 1 View  11/25/2015  CLINICAL DATA:  Cardiac arrest.  Endotracheal tube placement. EXAM: PORTABLE CHEST 1 VIEW COMPARISON:  06/01/2015 FINDINGS: External defibrillator pads overlie the chest. Endotracheal tube terminates approximately 3 cm above the carina. Enteric tube courses into the left upper abdomen. The cardiac silhouette is upper limits of normal in size. There is pulmonary vascular congestion without overt alveolar edema, segmental consolidation, sizeable pleural effusion, or pneumothorax. Sequelae of prior CABG are identified, and right upper quadrant abdominal surgical clips are noted. No acute osseous abnormality is identified. IMPRESSION: 1. Support devices as above. 2. Pulmonary vascular congestion. Electronically Signed   By: Freida Busman  Mosetta Putt M.D.   On: 11/25/2015 14:38     STUDIES:  3/24>>>Cath with very stenotic CIRC  CULTURES: Blood 3/24>>> Urine 3/24>>> Sputum 3/24>>>  ANTIBIOTICS: None  SIGNIFICANT EVENTS: 3/24>>>Cardiac arrest and initiation of hypothermia protocol.  LINES/TUBES: ETT3/24>>> Femoral sheeth3/24>>>  DISCUSSION: 55 year old chronically ill female suffering a PEA arrest on 3/24 with occlusion of her CIRC requiring stenting.  ASSESSMENT / PLAN:  PULMONARY A: VDRF due to to cardiac arrest COPD Respiratory acidosis. P:   - Full vent support. - Increase RR to compensate for acidosis. - Titrate O2 for sats. - Scheduled duonebs and PRN albuterol. - SBT post hypothermia and pending cardiac status.  CARDIOVASCULAR A:  PEA with stenotic CIRC Patient  was complaining of CP prior to collapse and circ is occluded all pointing at a cardiac event as cause of arrest not an ICH. P:  - To cath lab. - Anti-coag post cath per cards. - Levophed for BP support, target MAP of 85 mmHg. - Cardiac enzymes and EKG. - Echo per cards. - Will order a head CT to r/o ICH.  RENAL A:   Renal function stable. Metabolic (lactic) acidosis post arrest. P:   - Hydrate. - Will not be able to follow CVP (anti-coag and femoral line). - Replace electrolytes as indicated. - BMET q2 per protocol. - Will give 4 runs of K with cooling as K is 3.6 and will likely get moved intracellularly with cooling.  GASTROINTESTINAL A:   No active issues. P:   - OGT. - NPO. - PPI.  HEMATOLOGIC A:   Elevated WBC likely a stress response. Hg of 20.7 in a smoker with cardiac disease, likely chronic. Thrombocytosis P:  - CBC in AM. - Hydrate (likely hemoconcentrated). - Transfuse per ICU protocol.  INFECTIOUS A:   No indication of infection. P:   - Pan culture. - Hold off abx.  ENDOCRINE A:   DM by history and glucose on lytes is extremely elevated.   P:   - ISS. - CBGs. - Cortisol level. - Stress dose steroids until cortisol level results.  NEUROLOGIC A:   Anoxic injury likely. P:   RASS goal: -5 while on hypothermia. - EEG. - CT of the brain to r/o ICH. - Will need neuro evaluation pending what happens when rewarmed.   FAMILY  - Updates: No family bedside.  - Inter-disciplinary family meet or Palliative Care meeting due by:    The patient is critically ill with multiple organ systems failure and requires high complexity decision making for assessment and support, frequent evaluation and titration of therapies, application of advanced monitoring technologies and extensive interpretation of multiple databases.   Critical Care Time devoted to patient care services described in this note is  35  Minutes. This time reflects time of care of this  signee Dr Koren Bound. This critical care time does not reflect procedure time, or teaching time or supervisory time of PA/NP/Med student/Med Resident etc but could involve care discussion time.  Alyson Reedy, M.D. [facility] Pulmonary/Critical Care Medicine. Pager: [phone]. After hours pager: [phone].  11/25/2015, 3:16 PM

## 2015-11-25 NOTE — Progress Notes (Signed)
eLink Physician-Brief Progress Note Patient Name: Sara Hobbs DOB: 25-May-1961 MRN: [id number]   Date of Service  11/25/2015  HPI/Events of Note  Discussed the case with RN and Dr. Molli Knock. Pt was awake, following commands post cath and upon arrival at ICU  eICU Interventions  Will d/c code cool. Sedate pt. Try PST in am.      Intervention Category Intermediate Interventions: Abdominal pain - evaluation and management;Other:  Louann Sjogren 11/25/2015, 7:56 PM

## 2015-11-25 NOTE — Procedures (Signed)
ABG results given to Dr. Dellie Catholic via elink.

## 2015-11-25 NOTE — Progress Notes (Signed)
CRITICAL VALUE ALERT  Critical value received:HGB 15.3 Date of notification:  11/25/2015  Time of notification: 1800  Critical value read back:Yes.    Nurse who received alert:  Deneise Lever RN  MD notified (1st page):  Christene Slates  Time of first page:  1830  MD notified (2nd page):  Time of second page:  Responding MD:  Christene Slates Time MD responded:  [phone]

## 2015-11-25 NOTE — Progress Notes (Signed)
eLink Physician-Brief Progress Note Patient Name: Sara Hobbs DOB: 1961-07-09 MRN: [id number]   Date of Service  11/25/2015  HPI/Events of Note  Multiple issues: 1. Patient not cooled. Question need for Head CT scan. 2. Blood glucose = 431.   eICU Interventions  Will order: 1. D/C Head CT Scan.  2. D/C moderate Q 4 hour Novolog SSI.  3. Insulin IV infusion per ICU protocol.     Intervention Category Major Interventions: Hyperglycemia - active titration of insulin therapy Intermediate Interventions: Diagnostic test evaluation  Lenell Antu 11/25/2015, 9:16 PM

## 2015-11-25 NOTE — ED Notes (Addendum)
Pt brought to ED from [facility], unresponsive.  No pulse noted, apneic, gray.  CPR initiated immediately.  Pulse returned at 1345 after 2 rounds of cpr.

## 2015-11-25 NOTE — Sedation Documentation (Signed)
Fentanyl patch removed from L arm.

## 2015-11-26 ENCOUNTER — Inpatient Hospital Stay (HOSPITAL_COMMUNITY): Payer: BLUE CROSS/BLUE SHIELD

## 2015-11-26 DIAGNOSIS — J9601 Acute respiratory failure with hypoxia: Secondary | ICD-10-CM

## 2015-11-26 DIAGNOSIS — I469 Cardiac arrest, cause unspecified: Secondary | ICD-10-CM

## 2015-11-26 LAB — CBC
HEMATOCRIT: 39.8 % (ref 36.0–46.0)
Hemoglobin: 14.3 g/dL (ref 12.0–15.0)
MCH: 31.5 pg (ref 26.0–34.0)
MCHC: 35.9 g/dL (ref 30.0–36.0)
MCV: 87.7 fL (ref 78.0–100.0)
PLATELETS: 218 10*3/uL (ref 150–400)
RBC: 4.54 MIL/uL (ref 3.87–5.11)
RDW: 13.6 % (ref 11.5–15.5)
WBC: 15.1 10*3/uL — AB (ref 4.0–10.5)

## 2015-11-26 LAB — GLUCOSE, CAPILLARY
GLUCOSE-CAPILLARY: 431 mg/dL — AB (ref 65–99)
GLUCOSE-CAPILLARY: 376 mg/dL — AB (ref 65–99)
GLUCOSE-CAPILLARY: 308 mg/dL — AB (ref 65–99)
GLUCOSE-CAPILLARY: 244 mg/dL — AB (ref 65–99)
GLUCOSE-CAPILLARY: 105 mg/dL — AB (ref 65–99)
GLUCOSE-CAPILLARY: 138 mg/dL — AB (ref 65–99)
GLUCOSE-CAPILLARY: 157 mg/dL — AB (ref 65–99)
GLUCOSE-CAPILLARY: 191 mg/dL — AB (ref 65–99)
GLUCOSE-CAPILLARY: 201 mg/dL — AB (ref 65–99)
Glucose-Capillary: 152 mg/dL — ABNORMAL HIGH (ref 65–99)
Glucose-Capillary: 153 mg/dL — ABNORMAL HIGH (ref 65–99)
Glucose-Capillary: 174 mg/dL — ABNORMAL HIGH (ref 65–99)
Glucose-Capillary: 189 mg/dL — ABNORMAL HIGH (ref 65–99)
Glucose-Capillary: 220 mg/dL — ABNORMAL HIGH (ref 65–99)
Glucose-Capillary: 250 mg/dL — ABNORMAL HIGH (ref 65–99)
Glucose-Capillary: 286 mg/dL — ABNORMAL HIGH (ref 65–99)
Glucose-Capillary: 340 mg/dL — ABNORMAL HIGH (ref 65–99)
Glucose-Capillary: 365 mg/dL — ABNORMAL HIGH (ref 65–99)

## 2015-11-26 LAB — BLOOD GAS, ARTERIAL
Acid-base deficit: 4 mmol/L — ABNORMAL HIGH (ref 0.0–2.0)
BICARBONATE: 18.8 meq/L — AB (ref 20.0–24.0)
Drawn by: 42624
FIO2: 40
LHR: 28 {breaths}/min
MECHVT: 430 mL
O2 Saturation: 99.3 %
PEEP/CPAP: 5 cmH2O
Patient temperature: 98.6
TCO2: 19.5 mmol/L (ref 0–100)
pCO2 arterial: 24.7 mmHg — ABNORMAL LOW (ref 35.0–45.0)
pH, Arterial: 7.493 — ABNORMAL HIGH (ref 7.350–7.450)
pO2, Arterial: 173 mmHg — ABNORMAL HIGH (ref 80.0–100.0)

## 2015-11-26 LAB — BASIC METABOLIC PANEL
ANION GAP: 8 (ref 5–15)
BUN: 16 mg/dL (ref 6–20)
CALCIUM: 8.8 mg/dL — AB (ref 8.9–10.3)
CO2: 18 mmol/L — ABNORMAL LOW (ref 22–32)
Chloride: 114 mmol/L — ABNORMAL HIGH (ref 101–111)
Creatinine, Ser: 0.66 mg/dL (ref 0.44–1.00)
Glucose, Bld: 248 mg/dL — ABNORMAL HIGH (ref 65–99)
Potassium: 3.7 mmol/L (ref 3.5–5.1)
Sodium: 140 mmol/L (ref 135–145)

## 2015-11-26 LAB — ECHOCARDIOGRAM COMPLETE
HEIGHTINCHES: 62 in
Weight: 3160.51 oz

## 2015-11-26 LAB — HEMOGLOBIN A1C
Hgb A1c MFr Bld: 11.8 % — ABNORMAL HIGH (ref 4.8–5.6)
MEAN PLASMA GLUCOSE: 292 mg/dL

## 2015-11-26 MED ORDER — NOREPINEPHRINE BITARTRATE 1 MG/ML IV SOLN
0.0000 ug/min | INTRAVENOUS | Status: DC
  Administered 2015-11-26: 2 ug/min via INTRAVENOUS
  Filled 2015-11-26: qty 4

## 2015-11-26 MED ORDER — FENTANYL CITRATE (PF) 100 MCG/2ML IJ SOLN
25.0000 ug | INTRAMUSCULAR | Status: DC | PRN
  Administered 2015-11-27 (×2): 25 ug via INTRAVENOUS
  Filled 2015-11-26 (×3): qty 2

## 2015-11-26 MED ORDER — SODIUM CHLORIDE 0.9 % IV SOLN
INTRAVENOUS | Status: AC
  Administered 2015-11-26: 20:00:00 via INTRAVENOUS

## 2015-11-26 MED ORDER — ATROPINE SULFATE 0.1 MG/ML IJ SOLN
INTRAMUSCULAR | Status: AC
  Filled 2015-11-26: qty 10

## 2015-11-26 MED ORDER — INSULIN ASPART 100 UNIT/ML ~~LOC~~ SOLN
0.0000 [IU] | SUBCUTANEOUS | Status: DC
  Administered 2015-11-26: 7 [IU] via SUBCUTANEOUS
  Administered 2015-11-26: 4 [IU] via SUBCUTANEOUS
  Administered 2015-11-26: 7 [IU] via SUBCUTANEOUS
  Administered 2015-11-27: 4 [IU] via SUBCUTANEOUS
  Administered 2015-11-27: 3 [IU] via SUBCUTANEOUS

## 2015-11-26 MED ORDER — INSULIN GLARGINE 100 UNIT/ML ~~LOC~~ SOLN: 5.0000 [IU] | Freq: Every day | SUBCUTANEOUS | Status: DC

## 2015-11-26 MED ORDER — FENTANYL 25 MCG/HR TD PT72
25.0000 ug | MEDICATED_PATCH | TRANSDERMAL | Status: DC
  Administered 2015-11-26 – 2015-11-29 (×2): 25 ug via TRANSDERMAL
  Filled 2015-11-26 (×2): qty 1

## 2015-11-26 MED ORDER — INSULIN GLARGINE 100 UNIT/ML ~~LOC~~ SOLN
10.0000 [IU] | Freq: Every day | SUBCUTANEOUS | Status: DC
  Administered 2015-11-26: 10 [IU] via SUBCUTANEOUS
  Filled 2015-11-26 (×2): qty 0.1

## 2015-11-26 MED ORDER — CETYLPYRIDINIUM CHLORIDE 0.05 % MT LIQD
7.0000 mL | Freq: Two times a day (BID) | OROMUCOSAL | Status: DC
Start: 2015-11-26 — End: 2015-11-29
  Administered 2015-11-26 – 2015-11-28 (×4): 7 mL via OROMUCOSAL

## 2015-11-26 NOTE — Progress Notes (Signed)
R femoral sheath pulled at 1535 by Prince Rome, RN. Manual pressure held for 20 min. Pt tolerated well. Site CDI, BLE equally warm, pink, dry, cap refill <3 secs, DPs 2+. R groin venous line removed, pressure held for 8 min. Pressure dressing applied to sites and pt educated on holding pressure, to call for any bleeding.

## 2015-11-26 NOTE — Procedures (Signed)
Extubation Procedure Note  Patient Details:   Name: Sara Hobbs DOB: 11/13/1960 MRN: [id number]   Airway Documentation:  Airway 7.5 mm (Active)  Secured at (cm) 24 cm 11/26/2015  8:08 AM  Measured From Lips 11/26/2015  8:08 AM  Secured Location Center 11/26/2015  8:08 AM  Secured By [employer] 11/26/2015  8:08 AM  Tube Holder Repositioned Yes 11/26/2015  8:08 AM  Cuff Pressure (cm H2O) 22 cm H2O 11/26/2015  8:08 AM  Site Condition Dry 11/26/2015  7:46 AM    Evaluation  O2 sats: stable throughout Complications: No apparent complications Patient did tolerate procedure well. Bilateral Breath Sounds: Diminished   Yes  Ok Anis, MA 11/26/2015, 11:38 AM

## 2015-11-26 NOTE — Progress Notes (Signed)
Patient ID: Sara Hobbs, female   DOB: [birth year], 55 y.o.   MRN: [id number]    Subjective:  Intubated Sedation being weaned   Objective:  Filed Vitals:   11/26/15 0500 11/26/15 0600 11/26/15 0700 11/26/15 0746  BP: 80/57 87/67 107/75   Pulse: 85 79 79 88  Temp:      TempSrc:      Resp: '28 28 24 13  ' Height:      Weight: 89.6 kg (197 lb 8.5 oz)     SpO2: 100% 100% 100% 100%    Intake/Output from previous day:  Intake/Output Summary (Last 24 hours) at 11/26/15 0755 Last data filed at 11/26/15 0700  Gross per 24 hour  Intake 1256.17 ml  Output   2125 ml  Net -868.83 ml    Physical Exam: Sedated  Chronically ill white female  HEENT: normal Neck supple with no adenopathy JVP normal no bruits no thyromegaly Lungs clear with no wheezing and good diaphragmatic motion Heart:  S1/S2 no murmur, no rub, gallop or click PMI normal Abdomen: benighn, BS positve, no tenderness, no AAA no bruit.  No HSM or HJR Distal pulses intact with no bruits No edema Neuro non-focal Sheat right FA no hematoma   Lab Results: Basic Metabolic Panel:  Recent Labs  11/25/15 1747 11/26/15 0534  NA 135 140  K 4.5 3.7  CL 106 114*  CO2 17* 18*  GLUCOSE 512* 248*  BUN 14 16  CREATININE 0.81 0.66  CALCIUM 8.0* 8.8*  MG 1.9  --   PHOS 4.4  --    Liver Function Tests:  Recent Labs  11/25/15 1400 11/25/15 1747  AST 262* 338*  ALT 204* 256*  ALKPHOS 164* 144*  BILITOT 0.7 1.0  PROT 7.3 5.4*  ALBUMIN 4.5 3.5   CBC:  Recent Labs  11/25/15 1400  11/25/15 1747 11/26/15 0534  WBC 20.7*  --  15.7* 15.1*  NEUTROABS 7.7  --  12.5*  --   HGB 18.5*  < > 15.3* 14.3  HCT 53.3*  < > 44.0 39.8  MCV 92.7  --  88.0 87.7  PLT 228  --  183 218  < > = values in this interval not displayed. Hemoglobin A1C:  Recent Labs  11/25/15 1754  HGBA1C 11.8*   Fasting Lipid Panel:  Recent Labs  11/25/15 1859  TRIG 337*    Imaging: Dg Chest Port 1 View  11/25/2015  CLINICAL DATA:   Cardiac arrest.  Endotracheal tube placement. EXAM: PORTABLE CHEST 1 VIEW COMPARISON:  06/01/2015 FINDINGS: External defibrillator pads overlie the chest. Endotracheal tube terminates approximately 3 cm above the carina. Enteric tube courses into the left upper abdomen. The cardiac silhouette is upper limits of normal in size. There is pulmonary vascular congestion without overt alveolar edema, segmental consolidation, sizeable pleural effusion, or pneumothorax. Sequelae of prior CABG are identified, and right upper quadrant abdominal surgical clips are noted. No acute osseous abnormality is identified. IMPRESSION: 1. Support devices as above. 2. Pulmonary vascular congestion. Electronically Signed   By: Logan Bores M.D.   On: 11/25/2015 14:38    Cardiac Studies:  ECG:  SR rate 97 insig Q waves 3,F no ST elevation    Telemetry:  NSR no VT  11/26/2015   Echo:  Pending   Medications:   . antiseptic oral rinse  7 mL Mouth Rinse 10 times per day  . aspirin  81 mg Oral Daily  . chlorhexidine gluconate (SAGE KIT)  15 mL Mouth Rinse  BID  . Chlorhexidine Gluconate Cloth  6 each Topical Q0600  . ipratropium-albuterol  3 mL Nebulization QID  . mupirocin ointment  1 application Nasal BID  . pantoprazole (PROTONIX) IV  40 mg Intravenous Daily  . sodium chloride flush  10-40 mL Intracatheter Q12H  . sodium chloride flush  3 mL Intravenous Q12H  . ticagrelor  90 mg Oral BID     . dexmedetomidine 0.3 mcg/kg/hr (11/26/15 0753)  . insulin (NOVOLIN-R) infusion 5.2 Units/hr (11/26/15 0720)  . norepinephrine (LEVOPHED) Adult infusion 2 mcg/min (11/26/15 0754)  . propofol (DIPRIVAN) infusion Stopped (11/25/15 2235)    Assessment/Plan:  Cardiac Arrest:  In setting of circumflex MI.  Post CABG with patent LIMA  RCA graft down but collaterals.  Post DES to proximal circumflex  EF 45-50% Wean from vent per CCM.  After extubated will try to pull RFA sheath.  Continue DAT  DM:  On insulin drip  A1c over 11  poorly controlled was on 3 different types of insulin prior to admission as well as glucophage   Jenkins Rouge 11/26/2015, 7:55 AM

## 2015-11-26 NOTE — Progress Notes (Signed)
PULMONARY / CRITICAL CARE MEDICINE   Name: Sara Hobbs MRN: [id number] DOB: 08-27-1961    ADMISSION DATE:  11/25/2015 CONSULTATION DATE:  11/25/2015  REFERRING MD:  EDP  CHIEF COMPLAINT:  PEA Arrest   SUBJECTIVE: RN reports cooling aborted due to patient waking after LHC.  Remains on low dose levophed at 3 mcg, precedex and insulin gtt.    VITAL SIGNS: BP 107/75 mmHg  Pulse 79  Temp(Src) 98.5 F (36.9 C) (Axillary)  Resp 24  Ht 5\' 2"  (1.575 m)  Wt 197 lb 8.5 oz (89.6 kg)  BMI 36.12 kg/m2  SpO2 100%  HEMODYNAMICS:    VENTILATOR SETTINGS: Vent Mode:  [-] PRVC FiO2 (%):  [40 %-100 %] 40 % Set Rate:  [15 bmp-28 bmp] 28 bmp Vt Set:  [430 mL-500 mL] 430 mL PEEP:  [5 cmH20-8 cmH20] 5 cmH20 Plateau Pressure:  [16 cmH20-29 cmH20] 16 cmH20  INTAKE / OUTPUT: I/O last 3 completed shifts: In: 1199.4 [I.V.:999.4; IV Piggyback:200] Out: 2125 [Urine:2125]  PHYSICAL EXAMINATION: General:  Chronically ill appearing female, sedated / intubated Neuro:  Awake, alert, follows commands, MAE HEENT:  [place]/AT, pupils =R, MMM. Cardiovascular:  RRR, Nl S1/S2, -M/R/G. Lungs:  Even/non-labored, lungs bilaterally clear Abdomen:  Soft, NT, ND and +BS. Musculoskeletal:  -edema and -tenderness. Skin: warm/dry, no edema   LABS:  BMET  Recent Labs Lab 11/25/15 1400 11/25/15 1410 11/25/15 1747 11/26/15 0534  NA 132* 135 135 140  K 3.6 3.6 4.5 3.7  CL 94* 97* 106 114*  CO2 16*  --  17* 18*  BUN 11 13 14 16   CREATININE 1.19* 0.80 0.81 0.66  GLUCOSE 606* 572* 512* 248*    Electrolytes  Recent Labs Lab 11/25/15 1400 11/25/15 1747 11/26/15 0534  CALCIUM 9.3 8.0* 8.8*  MG  --  1.9  --   PHOS  --  4.4  --     CBC  Recent Labs Lab 11/25/15 1400 11/25/15 1410 11/25/15 1747 11/26/15 0534  WBC 20.7*  --  15.7* 15.1*  HGB 18.5* 20.7* 15.3* 14.3  HCT 53.3* 61.0* 44.0 39.8  PLT 228  --  183 218    Coag's No results for input(s): APTT, INR in the last 168  hours.  Sepsis Markers  Recent Labs Lab 11/25/15 1411 11/25/15 1747  LATICACIDVEN 13.53* 1.9    ABG  Recent Labs Lab 11/25/15 1755 11/25/15 2113 11/26/15 0420  PHART 7.273* 7.399 7.493*  PCO2ART 49.3* 29.5* 24.7*  PO2ART 478.0* 141* 173*    Liver Enzymes  Recent Labs Lab 11/25/15 1400 11/25/15 1747  AST 262* 338*  ALT 204* 256*  ALKPHOS 164* 144*  BILITOT 0.7 1.0  ALBUMIN 4.5 3.5    Cardiac Enzymes No results for input(s): TROPONINI, PROBNP in the last 168 hours.  Glucose  Recent Labs Lab 11/26/15 0229 11/26/15 0328 11/26/15 0435 11/26/15 0527 11/26/15 0627 11/26/15 0633  GLUCAP 308* 286* 244* 220* 105* 138*    Imaging Dg Chest Port 1 View  11/25/2015  CLINICAL DATA:  Cardiac arrest.  Endotracheal tube placement. EXAM: PORTABLE CHEST 1 VIEW COMPARISON:  06/01/2015 FINDINGS: External defibrillator pads overlie the chest. Endotracheal tube terminates approximately 3 cm above the carina. Enteric tube courses into the left upper abdomen. The cardiac silhouette is upper limits of normal in size. There is pulmonary vascular congestion without overt alveolar edema, segmental consolidation, sizeable pleural effusion, or pneumothorax. Sequelae of prior CABG are identified, and right upper quadrant abdominal surgical clips are noted. No acute osseous abnormality  is identified. IMPRESSION: 1. Support devices as above. 2. Pulmonary vascular congestion. Electronically Signed   By: Sebastian Ache M.D.   On: 11/25/2015 14:38     STUDIES:  3/24 LHC >> stenotic CIRC s/p stent 3/25 CT Head >>   CULTURES: Blood 3/24 >> Urine 3/24 >> Sputum 3/24 >>  ANTIBIOTICS: None  SIGNIFICANT EVENTS: 3/24  Cardiac arrest and initiation of hypothermia protocol  LINES/TUBES: ETT3/24 >> Femoral sheeth 3/24 >>  DISCUSSION: 55 year old chronically ill female suffering a PEA arrest on 3/24 with occlusion of her CIRC requiring stenting.  ASSESSMENT / PLAN:  PULMONARY A: VDRF  due to to cardiac arrest COPD Respiratory acidosis P:   PRVC 8 cc/kg Wean PEEP / FiO2 for sats > 92% Intermittent CXR Scheduled duonebs and PRN albuterol SBT with goal extubation am 3/25 Hold home advair, spiriva for now (can resume closer to discharge)  CARDIOVASCULAR A:  PEA with stenotic CIRC Patient was complaining of CP prior to collapse and circ is occluded all pointing at a cardiac event as cause of arrest not an ICH. Hypertriglyceridemia  P:  Cardiology following, appreciate input Plan to pull sheath once extubated and stable respiratory status (will need to lie flat) Anti-coag post cath per cards Wean levophed to off, goal MAP >65 Trend troponin, EKG  RENAL A:   Renal function stable Metabolic (lactic) acidosis post arrest P:   Replace electrolytes as indicated Trend BMP / UOP  GASTROINTESTINAL A:   Obesity  P:   OGT NPO PPI  HEMATOLOGIC A:   Elevated WBC likely a stress response Hg of 20.7 in a smoker with cardiac disease, likely chronic Thrombocytosis P:  Trend CBC Transfuse per ICU protocol  INFECTIOUS A:   No indication of infection. P:   Pan culture Hold off abx  ENDOCRINE A:   DM  P:  Transition off hyperglycemia protocol Lantus 10 units QD D/C stress steroids  Hold metformin while inpatient   NEUROLOGIC A:   Chronic Pain  Post Arrest  P:   RASS goal: 0 SBT / WUA, goal extubation 3/25 Home pain regimen - fentanyl 25 mcg patch, confirmed with patient and family.  No longer takes oxy or MSIR PRN fentanyl for pain    FAMILY  - Updates: No family bedside.  - Inter-disciplinary family meet or Palliative Care meeting due by:  4/1   Canary Brim, NP-C [facility] Pgr: [phone] or if no answer [phone] 11/26/2015, 7:24 AM    Attending:  I have seen and examined the patient with Canary Brim and I agree with the findings of her note  Safely extubated this morning  On exam: Following  commands Lungs clear to auscultation Cardiac exam within normal limits  Acute respiratory failure with hypoxemia: We were able to extubate her this morning, continue post extubation order set including incentive spirometry, out of bed tomorrow Cardiac arrest: Per cardiology, she needs to remain flat for now because the sheath was just removed  Cardiology to take over care tomorrow  Critical care time 31 minutes  [place], MD [place] Pager: [phone] Cell: [phone] After 3pm or if no response, call [phone]

## 2015-11-26 NOTE — Progress Notes (Signed)
  Echocardiogram 2D Echocardiogram has been performed.  Delcie Roch 11/26/2015, 9:11 AM

## 2015-11-26 NOTE — Progress Notes (Signed)
Nutrition Follow-up  DOCUMENTATION CODES:   Obesity unspecified  INTERVENTION:   -If pt unable to extubated within 48 hours of intubation, recommend:  Initiate Vital High Protein @ 20 ml/hr via OGT and increase by 10 ml every 4 hours to goal rate of 50 ml/hr.   Tube feeding regimen provides 1200 kcal (100% of needs), 105 grams of protein, and 1003 ml of H2O.   NUTRITION DIAGNOSIS:   Inadequate oral intake related to inability to eat as evidenced by NPO status.  GOAL:   Provide needs based on ASPEN/SCCM guidelines  MONITOR:   Vent status, Labs, TF tolerance, Skin, I & O's  REASON FOR ASSESSMENT:   Ventilator    ASSESSMENT:   55 year old chronically ill female suffering a PEA arrest on 3/24 with occlusion of her CIRC requiring stenting.  Pt admitted with PEA arrest.   Patient is currently intubated on ventilator support. OGT in place.  MV: 9.3 L/min Temp (24hrs), Avg:98.1 F (36.7 C), Min:97.8 F (36.6 C), Max:98.5 F (36.9 C)  Propofol: n/a   Per CCM notes, cooling was aborted due to pt waking after LHC.   Wt hx reviewed. UBW around 200#. Noted wt stability within the past 6 months.   Nutrition-Focused physical exam completed. Findings are no fat depletion, no muscle depletion, and mild edema.   Labs reviewed: CBGS: 105-138.   Diet Order:  Diet NPO time specified  Skin:  Reviewed, no issues  Last BM:  PTA  Height:   Ht Readings from Last 1 Encounters:  11/25/15 5\' 2"  (1.575 m)    Weight:   Wt Readings from Last 1 Encounters:  11/26/15 197 lb 8.5 oz (89.6 kg)    Ideal Body Weight:  50 kg  BMI:  Body mass index is 36.12 kg/(m^2).  Estimated Nutritional Needs:   Kcal:  1100-1250  Protein:  >100 grams  Fluid:  >1.1 L  EDUCATION NEEDS:   No education needs identified at this time  Tekisha Darcey A. Mayford Knife, RD, LDN, CDE Pager: [phone] After hours Pager: [phone]

## 2015-11-26 NOTE — Progress Notes (Signed)
eLink Physician-Brief Progress Note Patient Name: Sara Hobbs DOB: 1961/07/08 MRN: [id number]   Date of Service  11/26/2015  HPI/Events of Note  Hypotension - BP = 76/42. Likely related to Precedex IV infusion.  eICU Interventions  Will order: 1. Hold Precedex IV infusion until BP improved. 2. Norepinephrine IV infusion. Titrate to MAP >= 65. 3. When BP improved, restart Precedex IV infusion at 1/2 present dose.      Intervention Category Major Interventions: Hypotension - evaluation and management  Sommer,Steven Eugene 11/26/2015, 1:00 AM

## 2015-11-27 ENCOUNTER — Inpatient Hospital Stay (HOSPITAL_COMMUNITY): Payer: BLUE CROSS/BLUE SHIELD

## 2015-11-27 DIAGNOSIS — Z794 Long term (current) use of insulin: Secondary | ICD-10-CM

## 2015-11-27 DIAGNOSIS — R739 Hyperglycemia, unspecified: Secondary | ICD-10-CM

## 2015-11-27 DIAGNOSIS — IMO0001 Reserved for inherently not codable concepts without codable children: Secondary | ICD-10-CM

## 2015-11-27 DIAGNOSIS — E1165 Type 2 diabetes mellitus with hyperglycemia: Secondary | ICD-10-CM

## 2015-11-27 LAB — BASIC METABOLIC PANEL
ANION GAP: 9 (ref 5–15)
BUN: 14 mg/dL (ref 6–20)
CALCIUM: 8.7 mg/dL — AB (ref 8.9–10.3)
CO2: 22 mmol/L (ref 22–32)
Chloride: 112 mmol/L — ABNORMAL HIGH (ref 101–111)
Creatinine, Ser: 0.61 mg/dL (ref 0.44–1.00)
GFR calc Af Amer: >60 mL/min (ref >60)
GLUCOSE: 173 mg/dL — AB (ref 65–99)
Potassium: 3.2 mmol/L — ABNORMAL LOW (ref 3.5–5.1)
SODIUM: 143 mmol/L (ref 135–145)

## 2015-11-27 LAB — GLUCOSE, CAPILLARY
GLUCOSE-CAPILLARY: 166 mg/dL — AB (ref 65–99)
GLUCOSE-CAPILLARY: 158 mg/dL — AB (ref 65–99)
GLUCOSE-CAPILLARY: 229 mg/dL — AB (ref 65–99)
GLUCOSE-CAPILLARY: 184 mg/dL — AB (ref 65–99)
Glucose-Capillary: 149 mg/dL — ABNORMAL HIGH (ref 65–99)

## 2015-11-27 LAB — CBC
HCT: 38.1 % (ref 36.0–46.0)
HEMOGLOBIN: 12.7 g/dL (ref 12.0–15.0)
MCH: 30.2 pg (ref 26.0–34.0)
MCHC: 33.3 g/dL (ref 30.0–36.0)
MCV: 90.5 fL (ref 78.0–100.0)
Platelets: 146 10*3/uL — ABNORMAL LOW (ref 150–400)
RBC: 4.21 MIL/uL (ref 3.87–5.11)
RDW: 14.2 % (ref 11.5–15.5)
WBC: 12.2 10*3/uL — AB (ref 4.0–10.5)

## 2015-11-27 LAB — URINALYSIS, ROUTINE W REFLEX MICROSCOPIC
BILIRUBIN URINE: NEGATIVE
Ketones, ur: NEGATIVE mg/dL
Leukocytes, UA: NEGATIVE
Nitrite: NEGATIVE
PH: 5.5 (ref 5.0–8.0)
Protein, ur: NEGATIVE mg/dL
SPECIFIC GRAVITY, URINE: 1.019 (ref 1.005–1.030)

## 2015-11-27 LAB — URINE CULTURE: CULTURE: NO GROWTH

## 2015-11-27 LAB — URINE MICROSCOPIC-ADD ON

## 2015-11-27 MED ORDER — INSULIN ASPART 100 UNIT/ML ~~LOC~~ SOLN
0.0000 [IU] | Freq: Three times a day (TID) | SUBCUTANEOUS | Status: DC
  Administered 2015-11-27: 7 [IU] via SUBCUTANEOUS
  Administered 2015-11-27: 4 [IU] via SUBCUTANEOUS
  Administered 2015-11-28 – 2015-11-29 (×3): 7 [IU] via SUBCUTANEOUS

## 2015-11-27 MED ORDER — POTASSIUM CHLORIDE CRYS ER 20 MEQ PO TBCR
30.0000 meq | EXTENDED_RELEASE_TABLET | ORAL | Status: AC
  Administered 2015-11-27 (×2): 30 meq via ORAL
  Filled 2015-11-27 (×2): qty 1

## 2015-11-27 MED ORDER — INSULIN GLARGINE 100 UNIT/ML ~~LOC~~ SOLN
15.0000 [IU] | Freq: Every day | SUBCUTANEOUS | Status: DC
  Administered 2015-11-27: 15 [IU] via SUBCUTANEOUS
  Filled 2015-11-27 (×3): qty 0.15

## 2015-11-27 MED ORDER — OXYCODONE-ACETAMINOPHEN 7.5-325 MG PO TABS
1.0000 | ORAL_TABLET | ORAL | Status: DC | PRN
  Administered 2015-11-27 – 2015-11-29 (×10): 1 via ORAL
  Filled 2015-11-27 (×10): qty 1

## 2015-11-27 MED ORDER — INSULIN ASPART 100 UNIT/ML ~~LOC~~ SOLN
3.0000 [IU] | Freq: Three times a day (TID) | SUBCUTANEOUS | Status: DC
  Administered 2015-11-27: 3 [IU] via SUBCUTANEOUS

## 2015-11-27 MED ORDER — INSULIN ASPART 100 UNIT/ML ~~LOC~~ SOLN
0.0000 [IU] | Freq: Every day | SUBCUTANEOUS | Status: DC
  Administered 2015-11-28: 4 [IU] via SUBCUTANEOUS

## 2015-11-27 NOTE — Progress Notes (Signed)
PULMONARY / CRITICAL CARE MEDICINE   Name: Sara Hobbs MRN: [id number] DOB: February 01, 1961    ADMISSION DATE:  11/25/2015 CONSULTATION DATE:  11/25/2015  REFERRING MD:  EDP  CHIEF COMPLAINT:  PEA Arrest   SUBJECTIVE: RN reports pt doing very well post extubation - tolerating diet, not on O2.  Glucose running 140-250 on lantus + SSI. Pt reports chest soreness from CPR.    VITAL SIGNS: BP 125/70 mmHg  Pulse 96  Temp(Src) 98.7 F (37.1 C) (Oral)  Resp 16  Ht  (1.575 m)  Wt 198 lb 13.7 oz (90.2 kg)  BMI 36.36 kg/m2  SpO2 98%  HEMODYNAMICS:    VENTILATOR SETTINGS: Vent Mode:  [-] CPAP;PSV FiO2 (%):  [40 %] 40 % PEEP:  [5 cmH20] 5 cmH20 Pressure Support:  [5 cmH20] 5 cmH20  INTAKE / OUTPUT: I/O last 3 completed shifts: In: 1494.5 [I.V.:1344.5; IV Piggyback:150] Out: 2875 [Urine:2875]  PHYSICAL EXAMINATION: General:  Chronically ill appearing female in NAD Neuro:  AAOx4, speech clear, MAE HEENT:  [place]/AT, pupils =R, MMM. Cardiovascular:  RRR, Nl S1/S2, -M/R/G. Lungs:  Even/non-labored, lungs bilaterally clear Abdomen:  Soft, NT, ND and +BS. Musculoskeletal:  -edema and -tenderness. Skin: warm/dry, no edema   LABS:  BMET  Recent Labs Lab 11/25/15 1747 11/26/15 0534 11/27/15 0217  NA 135 140 143  K 4.5 3.7 3.2*  CL 106 114* 112*  CO2 17* 18* 22  BUN CREATININE 0.81 0.66 0.61  GLUCOSE 512* 248* 173*    Electrolytes  Recent Labs Lab 11/25/15 1747 11/26/15 0534 11/27/15 0217  CALCIUM 8.0* 8.8* 8.7*  MG 1.9  --   --   PHOS 4.4  --   --    CBC  Recent Labs Lab 11/25/15 1747 11/26/15 0534 11/27/15 0217  WBC 15.7* 15.1* 12.2*  HGB 15.3* 14.3 12.7  HCT 44.0 39.8 38.1  PLT 183 218 146*   Sepsis Markers  Recent Labs Lab 11/25/15 1411 11/25/15 1747  LATICACIDVEN 13.53* 1.9   ABG  Recent Labs Lab 11/25/15 1755 11/25/15 2113 11/26/15 0420  PHART 7.273* 7.399 7.493*  PCO2ART 49.3* 29.5* 24.7*  PO2ART 478.0* 141* 173*    Liver Enzymes  Recent Labs Lab 11/25/15 1400 11/25/15 1747  AST 262* 338*  ALT 204* 256*  ALKPHOS 164* 144*  BILITOT 0.7 1.0  ALBUMIN 4.5 3.5   Glucose  Recent Labs Lab 11/26/15 1025 11/26/15 1152 11/26/15 1614 11/26/15 1950 11/26/15 2327 11/27/15 0338  GLUCAP 189* 157* 191* 201* 250* 149*    Imaging No results found.   STUDIES:  3/24 LHC >> stenotic CIRC s/p stent 3/25 CT Head >>   CULTURES: Blood 3/24 >> Urine 3/24 >> Sputum 3/24 >>  ANTIBIOTICS: None  SIGNIFICANT EVENTS: 3/24  Cardiac arrest and initiation of hypothermia protocol 3/25  Extubated  LINES/TUBES: ETT3/24 >> 3/25 Femoral sheeth 3/24 >> 3/25  DISCUSSION: 55 year old chronically ill female suffering a PEA arrest on 3/24 with occlusion of her CIRC requiring stenting.  ASSESSMENT / PLAN:  PULMONARY A: VDRF due to to cardiac arrest - resolved.  COPD Respiratory acidosis - resolved.  P:   Intermittent CXR Push pulmonary hygiene with splinting from CPR - IS, mobilize Scheduled duonebs and PRN albuterol Hold home advair, spiriva for now (can resume closer to discharge)  CARDIOVASCULAR A:  PEA with stenotic CIRC Patient was complaining of CP prior to collapse and circ is occluded all pointing at a cardiac event as cause of arrest not  an ICH. Hypertriglyceridemia  P:  Per Cardiology  SDU monitoring, ok to transition out of unit from respiratory standpoint   HEMATOLOGIC A:   Elevated WBC likely a stress response Hg of 20.7 in a smoker with cardiac disease, likely chronic Thrombocytosis P:  Trend CBC  ENDOCRINE A:   DM  P:  Lantus 15 units QD Adjusted to TID & QHS CBG with meal / HS coverage Consult Diabetes Coordinator  Hold metformin while inpatient   NEUROLOGIC A:   Chronic Pain  Post Arrest  P:   Home pain regimen - fentanyl 25 mcg patch, confirmed with patient and family.  No longer takes oxy or MSIR PRN fentanyl for pain    FAMILY  - Updates: patient  updated on plan of care.      Canary Brim, NP-C [facility] Pgr: [phone] or if no answer [phone] 11/27/2015, 7:57 AM

## 2015-11-27 NOTE — Progress Notes (Signed)
[facility] ADULT ICU REPLACEMENT PROTOCOL FOR AM LAB REPLACEMENT ONLY  The patient does apply for the [facility] Adult ICU Electrolyte Replacment Protocol based on the criteria listed below:   1. Is GFR >/= 40 ml/min? Yes.    Patient's GFR today is >60 2. Is urine output >/= 0.5 ml/kg/hr for the last 6 hours? Yes.   Patient's UOP is 0.98 ml/kg/hr 3. Is BUN < 60 mg/dL? Yes.    Patient's BUN today is 14 4. Abnormal electrolyte(s):  K - 3.2 5. Ordered repletion with: PER PROTOCOL 6. If a panic level lab has been reported, has the CCM MD in charge been notified? Yes.  .   Physician:  Dr. Ocie Cornfield 11/27/2015 4:18 AM

## 2015-11-27 NOTE — Progress Notes (Signed)
Patient ID: Sara Hobbs, female   DOB: June 06, 1961, 55 y.o.   MRN: [id number]    Subjective:  Intubated Sedation being weaned   Objective:  Filed Vitals:   11/27/15 0300 11/27/15 0400 11/27/15 0500 11/27/15 0600  BP: 106/57 112/65 105/61 125/70  Pulse: 78 90 85 96  Temp:      TempSrc:      Resp: Height:      Weight:    90.2 kg (198 lb 13.7 oz)  SpO2: 98% 100% 98% 98%    Intake/Output from previous day:  Intake/Output Summary (Last 24 hours) at 11/27/15 0729 Last data filed at 11/27/15 0500  Gross per 24 hour  Intake 671.62 ml  Output    975 ml  Net -303.38 ml    Physical Exam: Sedated  Chronically ill white female  HEENT: normal Neck supple with no adenopathy JVP normal no bruits no thyromegaly Lungs clear with no wheezing and good diaphragmatic motion Heart:  S1/S2 no murmur, no rub, gallop or click PMI normal Abdomen: benighn, BS positve, no tenderness, no AAA no bruit.  No HSM or HJR Distal pulses intact with no bruits No edema Neuro non-focal Sheat right FA no hematoma   Lab Results: Basic Metabolic Panel:  Recent Labs  18/84/16 1747 11/26/15 0534 11/27/15 0217  NA 135 140 143  K 4.5 3.7 3.2*  CL 106 114* 112*  CO2 17* 18* 22  GLUCOSE 512* 248* 173*  BUN CREATININE 0.81 0.66 0.61  CALCIUM 8.0* 8.8* 8.7*  MG 1.9  --   --   PHOS 4.4  --   --    Liver Function Tests:  Recent Labs  11/25/15 1400 11/25/15 1747  AST 262* 338*  ALT 204* 256*  ALKPHOS 164* 144*  BILITOT 0.7 1.0  PROT 7.3 5.4*  ALBUMIN 4.5 3.5   CBC:  Recent Labs  11/25/15 1400  11/25/15 1747 11/26/15 0534 11/27/15 0217  WBC 20.7*  --  15.7* 15.1* 12.2*  NEUTROABS 7.7  --  12.5*  --   --   HGB 18.5*  < > 15.3* 14.3 12.7  HCT 53.3*  < > 44.0 39.8 38.1  MCV 92.7  --  88.0 87.7 90.5  PLT 228  --  183 218 146*  < > = values in this interval not displayed. Hemoglobin A1C:  Recent Labs  11/25/15 1754  HGBA1C 11.8*   Fasting Lipid  Panel:  Recent Labs  11/25/15 1859  TRIG 337*    Imaging: Dg Chest Port 1 View  11/26/2015  CLINICAL DATA:  Respiratory distress. EXAM: PORTABLE CHEST 1 VIEW COMPARISON:  11/25/2015. FINDINGS: Overlying telemetry leads and pads obscure central detail. Grossly satisfactory and unchanged support apparatus. Grossly normal cardiac silhouette. Mild vascular congestion without focal infiltrates or failure appears less prominent compared with yesterday's radiograph. IMPRESSION: Improved aeration. Electronically Signed   By: Elsie Stain M.D.   On: 11/26/2015 09:54   Dg Chest Port 1 View  11/25/2015  CLINICAL DATA:  Cardiac arrest.  Endotracheal tube placement. EXAM: PORTABLE CHEST 1 VIEW COMPARISON:  06/01/2015 FINDINGS: External defibrillator pads overlie the chest. Endotracheal tube terminates approximately 3 cm above the carina. Enteric tube courses into the left upper abdomen. The cardiac silhouette is upper limits of normal in size. There is pulmonary vascular congestion without overt alveolar edema, segmental consolidation, sizeable pleural effusion, or pneumothorax. Sequelae of prior CABG are identified, and right upper quadrant abdominal surgical clips are  noted. No acute osseous abnormality is identified. IMPRESSION: 1. Support devices as above. 2. Pulmonary vascular congestion. Electronically Signed   By: Sebastian Ache M.D.   On: 11/25/2015 14:38    Cardiac Studies:  ECG:  SR rate 97 insig Q waves 3,F no ST elevation    Telemetry:  NSR no VT  11/27/2015    Echo:  EF 45-50%    Medications:   . antiseptic oral rinse  7 mL Mouth Rinse BID  . aspirin  81 mg Oral Daily  . Chlorhexidine Gluconate Cloth  6 each Topical Q0600  . fentaNYL  25 mcg Transdermal Q72H  . insulin aspart  0-20 Units Subcutaneous 6 times per day  . insulin glargine  10 Units Subcutaneous Daily  . ipratropium-albuterol  3 mL Nebulization QID  . mupirocin ointment  1 application Nasal BID  . pantoprazole (PROTONIX)  IV  40 mg Intravenous Daily  . potassium chloride  30 mEq Oral Q4H  . sodium chloride flush  3 mL Intravenous Q12H  . ticagrelor  90 mg Oral BID     . dexmedetomidine Stopped (11/26/15 0825)  . norepinephrine (LEVOPHED) Adult infusion Stopped (11/26/15 0825)  . propofol (DIPRIVAN) infusion Stopped (11/25/15 2235)    Assessment/Plan:  Cardiac Arrest:  In setting of circumflex MI.  Post CABG with patent LIMA  RCA graft down but collaterals.  Post DES to proximal circumflex  EF 45-50% Extubated Chest pain from CPR.  OOB, IS   Continue DAT should be able to move out of unit  RFA sheath out with no hematoma.  Continue nebs Post extubation  Reviewed echo EF 45-50%   DM:  On insulin drip  A1c over 11 poorly controlled was on 3 different types of insulin prior to admission as well as glucophage  CCM to help With regimen now that she is extubated   Charlton Haws 11/27/2015, 7:29 AM

## 2015-11-27 NOTE — Progress Notes (Signed)
Utilization review completed.  

## 2015-11-27 NOTE — Progress Notes (Signed)
Inpatient Diabetes Program Recommendations  AACE/ADA: New Consensus Statement on Inpatient Glycemic Control (2015)  Target Ranges:  Prepandial:   less than 140 mg/dL      Peak postprandial:   less than 180 mg/dL (1-2 hours)      Critically ill patients:  140 - 180 mg/dL   Review of Glycemic Control  Diabetes history: DM 2 Outpatient Diabetes medications: Lantus 50 units QAM, 40 units QPM, Humalog 6-8 units @ supper, Metformin 1,000 mg Daily at supper Current orders for Inpatient glycemic control: Lantus 15 units, Novolog Resistant + HS + Novolog HS scale  Inpatient Diabetes Program Recommendations:   Consult for glucose review and control. Fasting glucose in the 160's this am. Glucose increased slightly overnight into the low 200's after meals. Agree with current Novolog and meal coverage regimen at this time. Will watch trends  Thanks,  Christena Deem RN, MSN, [facility] Inpatient Diabetes Coordinator Team Pager [phone] (8a-5p)

## 2015-11-27 NOTE — Progress Notes (Signed)
Report called to [facility] Rn on unit 2W. Patient to be transferred to 2W15 via bed.

## 2015-11-28 ENCOUNTER — Encounter (HOSPITAL_COMMUNITY): Payer: Self-pay | Admitting: Cardiovascular Disease

## 2015-11-28 DIAGNOSIS — I25110 Atherosclerotic heart disease of native coronary artery with unstable angina pectoris: Secondary | ICD-10-CM

## 2015-11-28 LAB — GLUCOSE, CAPILLARY
GLUCOSE-CAPILLARY: 239 mg/dL — AB (ref 65–99)
GLUCOSE-CAPILLARY: 204 mg/dL — AB (ref 65–99)
GLUCOSE-CAPILLARY: 327 mg/dL — AB (ref 65–99)
Glucose-Capillary: 176 mg/dL — ABNORMAL HIGH (ref 65–99)

## 2015-11-28 LAB — CULTURE, RESPIRATORY W GRAM STAIN

## 2015-11-28 LAB — BASIC METABOLIC PANEL
ANION GAP: 10 (ref 5–15)
BUN: 7 mg/dL (ref 6–20)
CALCIUM: 8.9 mg/dL (ref 8.9–10.3)
CO2: 23 mmol/L (ref 22–32)
Chloride: 109 mmol/L (ref 101–111)
Creatinine, Ser: 0.57 mg/dL (ref 0.44–1.00)
Glucose, Bld: 203 mg/dL — ABNORMAL HIGH (ref 65–99)
POTASSIUM: 3.6 mmol/L (ref 3.5–5.1)
SODIUM: 142 mmol/L (ref 135–145)

## 2015-11-28 LAB — CULTURE, RESPIRATORY: CULTURE: NORMAL

## 2015-11-28 LAB — PATHOLOGIST SMEAR REVIEW

## 2015-11-28 MED ORDER — INSULIN GLARGINE 100 UNIT/ML ~~LOC~~ SOLN
18.0000 [IU] | Freq: Every day | SUBCUTANEOUS | Status: DC
  Administered 2015-11-28 – 2015-11-29 (×2): 18 [IU] via SUBCUTANEOUS
  Filled 2015-11-28 (×2): qty 0.18

## 2015-11-28 MED ORDER — INSULIN ASPART 100 UNIT/ML ~~LOC~~ SOLN
5.0000 [IU] | Freq: Three times a day (TID) | SUBCUTANEOUS | Status: DC
Start: 2015-11-28 — End: 2015-11-29
  Administered 2015-11-28 – 2015-11-29 (×2): 5 [IU] via SUBCUTANEOUS

## 2015-11-28 MED ORDER — CARVEDILOL 3.125 MG PO TABS
3.1250 mg | ORAL_TABLET | Freq: Two times a day (BID) | ORAL | Status: DC
  Administered 2015-11-28 – 2015-11-29 (×2): 3.125 mg via ORAL
  Filled 2015-11-28 (×2): qty 1

## 2015-11-28 MED ORDER — PANTOPRAZOLE SODIUM 40 MG PO TBEC
40.0000 mg | DELAYED_RELEASE_TABLET | Freq: Every day | ORAL | Status: DC
  Administered 2015-11-28 – 2015-11-29 (×2): 40 mg via ORAL
  Filled 2015-11-28 (×2): qty 1

## 2015-11-28 MED ORDER — INSULIN GLARGINE 100 UNIT/ML ~~LOC~~ SOLN
20.0000 [IU] | Freq: Every day | SUBCUTANEOUS | Status: DC
  Filled 2015-11-28: qty 0.2

## 2015-11-28 MED FILL — Heparin Sodium (Porcine) 2 Unit/ML in Sodium Chloride 0.9%: INTRAMUSCULAR | Qty: 500 | Status: AC

## 2015-11-28 MED FILL — Nitroglycerin IV Soln 100 MCG/ML in D5W: INTRA_ARTERIAL | Qty: 10 | Status: AC

## 2015-11-28 NOTE — Progress Notes (Addendum)
Inpatient Diabetes Program Recommendations  AACE/ADA: New Consensus Statement on Inpatient Glycemic Control (2015)  Target Ranges:  Prepandial:   less than 140 mg/dL      Peak postprandial:   less than 180 mg/dL (1-2 hours)      Critically ill patients:  140 - 180 mg/dL  Results for ELAYA, HOSTEEN (MRN [id number]) as of 11/28/2015 08:22  Ref. Range 11/27/2015 08:17 11/27/2015 11:38 11/27/2015 16:10 11/27/2015 21:28 11/28/2015 07:03  Glucose-Capillary Latest Ref Range: 65-99 mg/dL 664 (H) 403 (H) 474 (H) 184 (H) 239 (H)  Results for ROSALIND, KOLLIN (MRN [id number]) as of 11/28/2015 08:22  Ref. Range 11/25/2015 17:54  Hemoglobin A1C Latest Ref Range: 4.8-5.6 % 11.8 (H)   Review of Glycemic Control  Diabetes history: DM2 Outpatient Diabetes medications: Lantus 50 units QAM,, Lantus 40 units QPM, Humalog 6-8 units TID with large meals, Metformin XR 500 mg BID (also has Humulin R U500 on home medication list with not that patient is not taking) Current orders for Inpatient glycemic control: Lantus 15 units dialy, Novolog 3 units TID with meals, Novolog 0-20 units TID with meals, Novolog 0-5 units QHS  Inpatient Diabetes Program Recommendations:  Insulin - Basal: Please consider increasing Lantus to 18 units daily (based on 88.7 kg x 0.2 units). Insulin - Meal Coverage: Please consider increasing meal coverage to Novolog 5 units TID with meals if patient eats at least 50% of meals.  NOTE: In reviewing the chart, noted patient is followed by Dr. Lucianne Muss (Endocrinologist) as an outpatient and she saw him last on 07/25/2015. At office visit on 07/25/2015 patient was asked to decrease Lantus to 45 units BID, was restarted on Metformin (patient has ran out of medication), and was started on Humulin R U500 (Concentrated insulin) 35 units BID with breakfast and supper. However on the medication list there is notation that patient is not taking the U500 and the Lanuts doeages are different than what Dr. Lucianne Muss asked  her to change to. Patient's A1C was 11.8% on 11/25/15 indicating very poor glycemic control over the past 2-3 months. Will plan to talk with patient today.  11/28/15@14 :54-Spoke with patient about diabetes and home regimen for diabetes control. Patient reports that she is followed by Dr. Lucianne Muss for diabetes management and currently she takes Lantus 50 units QAM, Lantus 40 units QPM, and Metformin XR 500 mg BID as an outpatient for diabetes control. Inquired about Humulin R U500 and Humalog as listed on home medication list and patient states that she is not taking either U500 or Humalog because she ran out of the insulins and has not been back to see Dr. Lucianne Muss for refills. Patient states that she took the U500 for 2 months (from the time she was started on U500 in November 2016) and she has not taken any Humalog for 2-3 months at which time she ran out of insulin. Patient states that she checks her glucose 2-3 times per week and that it is "usually OK". Discussed A1C results (11.8% on 11/25/15) and explained that her current A1C indicates an average glucose of 292 mg/dl over the past 2-3 months. Discussed glucose and A1C goals. Discussed importance of checking CBGs and maintaining good CBG control to prevent long-term and short-term complications. Explained how hyperglycemia leads to damage within blood vessels which lead to the common complications seen with uncontrolled diabetes. Stressed to the patient the importance of improving glycemic control to prevent further complications from uncontrolled diabetes. Discussed impact of nutrition, exercise, stress, sickness, and medications on  diabetes control. Encouraged patient to check her glucose 3-4 times per day (before meals and at bedtime) and to keep a log book of glucose readings and insulin taken which she will need to take to follow up appointments with Dr. Lucianne Muss. Patient states that she does not have a scheduled appointment with Dr. Lucianne Muss at this time.  Encouraged patient to go ahead and make an appointment with Dr. Lucianne Muss and patient stated "I will call when I feel better."  Explained how Dr. Lucianne Muss can use the log book to continue to make insulin adjustments if needed. Patient verbalized understanding of information discussed and he states that he has no further questions at this time related to diabetes.  Thanks, Orlando Penner, RN, MSN, CDE Diabetes Coordinator Inpatient Diabetes Program [phone] (Team Pager from 8am to 5pm) [phone] ([facility] office) [phone] [facility] office) [phone] [facility] office)

## 2015-11-28 NOTE — Care Management Important Message (Signed)
Important Message  Patient Details  Name: Sara Hobbs MRN: [id number] Date of Birth: 05-Jan-1961   Medicare Important Message Given:  Yes    Bernadette Hoit 11/28/2015, 12:43 PM

## 2015-11-28 NOTE — Progress Notes (Signed)
Hospital Problem List     Active Problems:   Cardiac arrest (HCC)   PEA (Pulseless electrical activity) (HCC)   Respiratory failure (HCC)   Acute respiratory failure with hypoxia (HCC)   Hyperglycemia     Patient Profile:   Primary Cardiologist: Dr. Judithe Modest  55 yo female w/ PMH of CAD (s/p CABG in 2016 w/ LIMA-LAD, SVG-PDA), COPD, IDDM, HTN, HLD, and chronic back pain and Hypothyroidism who presented to [facility] ED on 11/25/2015 for dyspnea, went into PEA while in the ED and required CPR. Emergent cath showed patent LIMA-LAD, occlusion of the SVG-PDA, and 99% stenosis of the Prox-Mid CX (successful PCI w/ DES placed).    Subjective   Reporting sternal chest pain this morning, tender to touch and worse with deep breathing. Likely secondary to CPR she required. Walking does not influence the pain. Says her breathing is as baseline.  Inpatient Medications    . antiseptic oral rinse  7 mL Mouth Rinse BID  . aspirin  81 mg Oral Daily  . Chlorhexidine Gluconate Cloth  6 each Topical Q0600  . fentaNYL  25 mcg Transdermal Q72H  . insulin aspart  0-20 Units Subcutaneous TID WC  . insulin aspart  0-5 Units Subcutaneous QHS  . insulin aspart  3 Units Subcutaneous TID WC  . insulin glargine  15 Units Subcutaneous Daily  . mupirocin ointment  1 application Nasal BID  . pantoprazole (PROTONIX) IV  40 mg Intravenous Daily  . sodium chloride flush  3 mL Intravenous Q12H  . ticagrelor  90 mg Oral BID    Vital Signs    Filed Vitals:   11/27/15 1125 11/27/15 2106 11/28/15 0312 11/28/15 0657  BP:  128/67 119/64   Pulse:  91 83   Temp:  99.5 F (37.5 C) 98.3 F (36.8 C)   TempSrc:  Oral Oral   Resp:  18 18   Height:      Weight:    195 lb 11.2 oz (88.769 kg)  SpO2: 95% 98% 94%     Intake/Output Summary (Last 24 hours) at 11/28/15 0818 Last data filed at 11/28/15 0314  Gross per 24 hour  Intake    120 ml  Output      0 ml  Net    120 ml   Filed Weights   11/26/15 0500 11/27/15 0600  11/28/15 0657  Weight: 197 lb 8.5 oz (89.6 kg) 198 lb 13.7 oz (90.2 kg) 195 lb 11.2 oz (88.769 kg)    Physical Exam    General: Well developed, well nourished, female appearing in no acute distress. Head: Normocephalic, atraumatic.  Neck: Supple without bruits, JVD not elevated. Lungs:  Resp regular and unlabored, CTA without wheezing or rales. Heart: RRR, S1, S2, no S3, S4, or murmur; no rub. Tender to palpation along sternum. Abdomen: Soft, non-tender, non-distended with normoactive bowel sounds. No hepatomegaly. No rebound/guarding. No obvious abdominal masses. Extremities: No clubbing, cyanosis, or edema. Distal pedal pulses are 2+ bilaterally. Neuro: Alert and oriented X 3. Moves all extremities spontaneously. Psych: Normal affect.  Labs    CBC  Recent Labs  11/25/15 1400  11/25/15 1747 11/26/15 0534 11/27/15 0217  WBC 20.7*  --  15.7* 15.1* 12.2*  NEUTROABS 7.7  --  12.5*  --   --   HGB 18.5*  < > 15.3* 14.3 12.7  HCT 53.3*  < > 44.0 39.8 38.1  MCV 92.7  --  88.0 87.7 90.5  PLT 228  --  183 218 146*  < > = values in this interval not displayed. Basic Metabolic Panel  Recent Labs  11/25/15 1747  11/27/15 0217 11/28/15 0251  NA 135  < > 143 142  K 4.5  < > 3.2* 3.6  CL 106  < > 112* 109  CO2 17*  < > 22 23  GLUCOSE 512*  < > 173* 203*  BUN 14  < > 14 7  CREATININE 0.81  < > 0.61 0.57  CALCIUM 8.0*  < > 8.7* 8.9  MG 1.9  --   --   --   PHOS 4.4  --   --   --   < > = values in this interval not displayed. Liver Function Tests  Recent Labs  11/25/15 1400 11/25/15 1747  AST 262* 338*  ALT 204* 256*  ALKPHOS 164* 144*  BILITOT 0.7 1.0  PROT 7.3 5.4*  ALBUMIN 4.5 3.5   No results for input(s): LIPASE, AMYLASE in the last 72 hours. Cardiac Enzymes No results for input(s): CKTOTAL, CKMB, CKMBINDEX, TROPONINI in the last 72 hours. BNP Invalid input(s): POCBNP D-Dimer No results for input(s): DDIMER in the last 72 hours. Hemoglobin A1C  Recent Labs   11/25/15 1754  HGBA1C 11.8*   Fasting Lipid Panel  Recent Labs  11/25/15 1859  TRIG 337*     Telemetry    NSR, HR in 80's - 90's. No atopic events.  ECG    No new tracings.    Cardiac Studies and Radiology    Dg Chest Port 1 View  11/27/2015  CLINICAL DATA:  Acute respiratory failure. EXAM: PORTABLE CHEST 1 VIEW COMPARISON:  11/26/2015. FINDINGS: The patient has been extubated. Enteric tube is removed. The heart remains enlarged. Prior CABG. Slight vascular congestion without infiltrates or overt failure. IMPRESSION: Satisfactory post extubation film.  Slight vascular congestion. Electronically Signed   By: Elsie Stain M.D.   On: 11/27/2015 08:50   Dg Chest Port 1 View  11/26/2015  CLINICAL DATA:  Respiratory distress. EXAM: PORTABLE CHEST 1 VIEW COMPARISON:  11/25/2015. FINDINGS: Overlying telemetry leads and pads obscure central detail. Grossly satisfactory and unchanged support apparatus. Grossly normal cardiac silhouette. Mild vascular congestion without focal infiltrates or failure appears less prominent compared with yesterday's radiograph. IMPRESSION: Improved aeration. Electronically Signed   By: Elsie Stain M.D.   On: 11/26/2015 09:54   Dg Chest Port 1 View  11/25/2015  CLINICAL DATA:  Cardiac arrest.  Endotracheal tube placement. EXAM: PORTABLE CHEST 1 VIEW COMPARISON:  06/01/2015 FINDINGS: External defibrillator pads overlie the chest. Endotracheal tube terminates approximately 3 cm above the carina. Enteric tube courses into the left upper abdomen. The cardiac silhouette is upper limits of normal in size. There is pulmonary vascular congestion without overt alveolar edema, segmental consolidation, sizeable pleural effusion, or pneumothorax. Sequelae of prior CABG are identified, and right upper quadrant abdominal surgical clips are noted. No acute osseous abnormality is identified. IMPRESSION: 1. Support devices as above. 2. Pulmonary vascular congestion. Electronically  Signed   By: Sebastian Ache M.D.   On: 11/25/2015 14:38    Echocardiogram: 11/26/2015 Study Conclusions - Left ventricle: Distal septal and posterior lateral hypokinesis.  The cavity size was mildly dilated. Wall thickness was normal.  Systolic function was mildly reduced. The estimated ejection  fraction was in the range of 45% to 50%. - Mitral valve: There was mild regurgitation. - Atrial septum: No defect or patent foramen ovale was identified.  Assessment & Plan  1. Cardiac Arrest  - occurring in the setting of circumflex MI. History of CAD (s/p CABG in 2016 w/ LIMA-LAD, SVG-PDA) - cath this admission showed patent LIMA-LAD, occlusion of the SVG-PDA, and 99% stenosis of the Prox-Mid CX (successful PCI w/ DES placed). - continue DAPT with ASA and Brilinta. Will restart BB as her BP and HR are stable. Statin currently held due to significant transaminitis.  -  She is reporting chest pain that is reproducible on palpation, worse with deep breathing, likely secondary to CPR she required at time of admission. Was on a Fentanyl patch PTA for chronic pain and is receiving Fentanyl and Percocet at this time. Cardiac Rehab was present at the time of this encounter and will ambulate with the patient today.  2. Uncontrolled IDDM - A1c elevated to 11.8. Glucose initially in the 370's. Has improved into the 160's  - 220's yesterday.  - was on 3 different types of Insulin prior to admission. - will increase her Lantus dose with elevated morning glucose readings. Appreciate Diabetes Coordinator recommendations.  3. COPD - continue with Duonebs and PRN Albuterol with plan to resume PTA Advair and Spiriva at time of discharge per CCM recommendations.  4. HTN - BP has been 119/64 - 145/78 in the past 24 hours.  - will resume home Coreg.  5. HLD - not currently on statin at this time. LFT's elevated at time of admission (AST 338, ALT 256).  - will recheck LFT's with morning labs.    Lorri Frederick , PA-C 8:18 AM 11/28/2015 Pager: [phone]   I have personally seen and examined this patient with Randall An, PA-C. I agree with the assessment and plan as outlined above. She is post cardiac arrest, NSTEMI. DES placed Circumflex. Chest wall sore this am, likely from CPR. BP stable. Labs reviewed. Exam with RRR, no murmurs, no LE edema, lungs clear. Will continue current DAPT with ASA and Brilinta. Statin on hold with elevated LFTs. Ambulate today. Possible d/c home tomorrow.   Chandler Swiderski 11/28/2015 9:37 AM

## 2015-11-28 NOTE — Care Management Note (Signed)
Case Management Note Donn Pierini RN, BSN Unit 2W-Case Manager [phone]  Patient Details  Name: Doree Longman MRN: [id number] Date of Birth: 02-26-1961  Subjective/Objective:    Pt admitted s/p cardiac arrest                Action/Plan: PTA pt lived at home with spouse- anticipate return home - pt hx stents has been on Brilinta prior to this admission- spoke with pt and spouse- at bedside- per conversation pt has been taking Brilinta and has not had any difficulty getting medications- drug plan is with [employer]- [id number]) ID- [id number]- checked on coverage just to be sure- Per rep at [employer]: $80 at retail for 30 day- no auth required - patient can use any major retail pharmacy -  Per pt she uses [facility] for pharmacy needs. - Pt is currently on room air- ambulating in room unassisted- do not anticipate any needs at discharge- no needs identified with pt and spouse,- CM will continue to follow  Expected Discharge Date:                  Expected Discharge Plan:  Home/Self Care  In-House Referral:     Discharge planning Services  CM Consult, Medication Assistance  Post Acute Care Choice:  NA Choice offered to:  NA  DME Arranged:    DME Agency:     HH Arranged:    HH Agency:     Status of Service:  In process, will continue to follow  Medicare Important Message Given:    Date Medicare IM Given:    Medicare IM give by:    Date Additional Medicare IM Given:    Additional Medicare Important Message give by:     If discussed at Long Length of Stay Meetings, dates discussed:    Additional Comments:  Darrold Span, RN 11/28/2015, 12:28 PM

## 2015-11-28 NOTE — Progress Notes (Signed)
CARDIAC REHAB PHASE I   PRE:  Rate/Rhythm: 78 SR  BP:  Supine:   Sitting: 126/71  Standing:    SaO2: 98%RA  MODE:  Ambulation: 550 ft   POST:  Rate/Rhythm: 98  BP:  Supine:   Sitting: 127/69  Standing:    SaO2: 100%RA 0830-0900 Pt received pain med for CP from CPR prior to walk. Pt walked 550 ft on RA with hand held asst x 2 with steady gait. Stopped a couple of times to cough. Stated felt a little dizzy toward end of walk. Denied SOB or chest pressure. To recliner with call bell. Can be asst x 1.   Luetta Nutting, RN BSN  11/28/2015 8:56 AM

## 2015-11-29 ENCOUNTER — Encounter (HOSPITAL_COMMUNITY): Payer: Self-pay | Admitting: Student

## 2015-11-29 ENCOUNTER — Telehealth: Payer: Self-pay | Admitting: Cardiovascular Disease

## 2015-11-29 DIAGNOSIS — Z794 Long term (current) use of insulin: Secondary | ICD-10-CM

## 2015-11-29 DIAGNOSIS — E1165 Type 2 diabetes mellitus with hyperglycemia: Secondary | ICD-10-CM

## 2015-11-29 LAB — GLUCOSE, CAPILLARY
GLUCOSE-CAPILLARY: 214 mg/dL — AB (ref 65–99)
GLUCOSE-CAPILLARY: 164 mg/dL — AB (ref 65–99)

## 2015-11-29 LAB — COMPREHENSIVE METABOLIC PANEL
ALBUMIN: 3.2 g/dL — AB (ref 3.5–5.0)
ALT: 51 U/L (ref 14–54)
ANION GAP: 9 (ref 5–15)
AST: 16 U/L (ref 15–41)
Alkaline Phosphatase: 98 U/L (ref 38–126)
BILIRUBIN TOTAL: 0.7 mg/dL (ref 0.3–1.2)
BUN: 8 mg/dL (ref 6–20)
CO2: 24 mmol/L (ref 22–32)
Calcium: 8.9 mg/dL (ref 8.9–10.3)
Chloride: 108 mmol/L (ref 101–111)
Creatinine, Ser: 0.56 mg/dL (ref 0.44–1.00)
Glucose, Bld: 206 mg/dL — ABNORMAL HIGH (ref 65–99)
POTASSIUM: 3.7 mmol/L (ref 3.5–5.1)
Sodium: 141 mmol/L (ref 135–145)
TOTAL PROTEIN: 5.4 g/dL — AB (ref 6.5–8.1)

## 2015-11-29 MED ORDER — ATORVASTATIN CALCIUM 40 MG PO TABS: 40.0000 mg | ORAL_TABLET | Freq: Every day | ORAL | Status: DC

## 2015-11-29 NOTE — Progress Notes (Addendum)
CARDIAC REHAB PHASE I   Pt still c/o chest pain related to chest compressions. Pt states she walked independently in the hallway this morning, declines ambulation with cardiac rehab. Completed MI/stent education.  Reviewed risk factors, tobacco cessation (pt states she does not plan to quit smoking), anti-platelet therapy, stent card (not in pt chart or at bedside, pt states she does not know if her husband has her stent card), activity restrictions, ntg, exercise, heart healthy diet, carb counting, portion control, sodium restrictions, s/s heart failure, and phase 2 cardiac rehab. Pt verbalized understanding, flat affect, limited receptiveness to education. Pt agrees to phase 2 cardiac rehab referral, will send to [facility] per pt request. Pt in bed, call bell within reach. Will follow.   [phone] Joylene Grapes, RN, BSN 11/29/2015 9:48 AM

## 2015-11-29 NOTE — Discharge Summary (Signed)
Discharge Summary    Patient ID: Sara Hobbs,  MRN: [id number], DOB/AGE: 05-30-1961 55 y.o.  Admit date: 11/25/2015 Discharge date: 11/29/2015  Primary Care Provider: CLOWARD,DAVIS L Primary Cardiologist: Previously Dr. Judithe Modest - Wants to follow with [facility] now (Dr. Excell Seltzer)  Discharge Diagnoses    Active Problems:   Cardiac arrest [facility])   PEA (Pulseless electrical activity) (HCC)   Respiratory failure (HCC)   Acute respiratory failure with hypoxia (HCC)   Uncontrolled insulin dependent diabetes mellitus (HCC)   History of Present Illness     Sara Hobbs is a 55 y.o. female with past medical history of CAD (s/p CABG), COPD, IDDM, HTN, HLD, and chronic pain who presented to [facility] ED on 11/25/2015 for evaluation of chest pain and shortness of breath starting earlier that morning.   While in triage, she became unresponsive and was in PEA. CPR was initiated with two doses of Epinephrine administered before return of spontaneous circulation. No defibrillation was required. The patient was emergently intubated during this process. Labs showed a lactic acidosis of 13.53 and EKG showed a new RBBB with inferior Q-waves and an initial troponin of 0.90. She was then taken emergently to the catheterization lab.  Hospital Course     Consultants: Critical Care   Her catheterization showed severe three-vessel coronary artery disease with severe stenosis of the proximal left circumflex, total occlusion of the proximal LAD, and total occlusion of the mid RCA (collaterals present). The LIMA-LAD was patent but CTO of the SVG-PDA was noted. The Prox to Mid Cx was 99% stenosed and successful  PCI to left circumflex using a Promus DES was performed. She was continued on Angiomax for 2 hours after being loaded with Brilinta 180 mg. The full report is included below.  Upon arrival to the CCU, she was noted to be hyperglycemic into the 400's and was started on SSI but later required an insulin drip  (Hgb A1c 11.8 this admission). She was hypotensive into the 70's overnight and was started on Norepinephrine. The full cooling protocol was unable to be performed due to the patient waking up. She was able to be extubated on 11/26/2015.  Critical Care helped to manage her glucose and wean off the Insulin drip. Recommendations were also appreciated from the Diabetes Coordinator as well. She was transferred to telemetry on 11/27/2015.  On 11/28/2015, she reported having chest discomfort that was reproducible with palpation and tender to touch, thought to be secondary to CPR she required at time of admission. She was continued on her Fentanyl patch which was prescribed prior to admission by her Pain Management doctor, with whom she has a pain contract with.  She was able to ambulate over 550 ft with cardiac rehab later that day without any significant symptoms.  She continued to progress well on 11/29/2015. Still had mild chest discomfort which was tender to touch and worse with positional changes. Her LFT's were rechecked and were WNL, therefore her statin was restarted at a decreased dose of Lipitor 40mg  daily. She will continue on DAPT with ASA and Brilinta.  She was last examined by Dr. Clifton James and deemed stable for discharge. A 7-10 day TCM appointment has been arranged. At the time of discharge, she requested additional pain medications but these were not provided due to her having a pain contract with a Pain Management provider. Will continue with her Fentanyl patch for now. She was instructed to follow-up with her Pain Management doctor in regards to her pain  medications. She reported having refills of her cardiac medications (on Brilinta prior to admission) and was instructed to call the office if she needed refills prior to her appointment.   Discharge Vitals Blood pressure 124/70, pulse 80, temperature 97.6 F (36.4 C), temperature source Oral, resp. rate 18, height  (1.575 m), weight 194 lb 8  oz (88.225 kg), SpO2 95 %.  Filed Weights   11/27/15 0600 11/28/15 0657 11/29/15 0500  Weight: 198 lb 13.7 oz (90.2 kg) 195 lb 11.2 oz (88.769 kg) 194 lb 8 oz (88.225 kg)    Labs & Radiologic Studies     CBC  Recent Labs  11/27/15 0217  WBC 12.2*  HGB 12.7  HCT 38.1  MCV 90.5  PLT 146*   Basic Metabolic Panel  Recent Labs  11/28/15 0251 11/29/15 0423  NA 142 141  K 3.6 3.7  CL 109 108  CO2 23 24  GLUCOSE 203* 206*  BUN 7 8  CREATININE 0.57 0.56  CALCIUM 8.9 8.9   Liver Function Tests  Recent Labs  11/29/15 0423  AST 16  ALT 51  ALKPHOS 98  BILITOT 0.7  PROT 5.4*  ALBUMIN 3.2*    Dg Chest Port 1 View: 11/27/2015  CLINICAL DATA:  Acute respiratory failure. EXAM: PORTABLE CHEST 1 VIEW COMPARISON:  11/26/2015. FINDINGS: The patient has been extubated. Enteric tube is removed. The heart remains enlarged. Prior CABG. Slight vascular congestion without infiltrates or overt failure. IMPRESSION: Satisfactory post extubation film.  Slight vascular congestion. Electronically Signed   By: Elsie Stain M.D.   On: 11/27/2015 08:50   Dg Chest Port 1 View: 11/26/2015  CLINICAL DATA:  Respiratory distress. EXAM: PORTABLE CHEST 1 VIEW COMPARISON:  11/25/2015. FINDINGS: Overlying telemetry leads and pads obscure central detail. Grossly satisfactory and unchanged support apparatus. Grossly normal cardiac silhouette. Mild vascular congestion without focal infiltrates or failure appears less prominent compared with yesterday's radiograph. IMPRESSION: Improved aeration. Electronically Signed   By: Elsie Stain M.D.   On: 11/26/2015 09:54   Dg Chest Port 1 View: 11/25/2015  CLINICAL DATA:  Cardiac arrest.  Endotracheal tube placement. EXAM: PORTABLE CHEST 1 VIEW COMPARISON:  06/01/2015 FINDINGS: External defibrillator pads overlie the chest. Endotracheal tube terminates approximately 3 cm above the carina. Enteric tube courses into the left upper abdomen. The cardiac silhouette is upper  limits of normal in size. There is pulmonary vascular congestion without overt alveolar edema, segmental consolidation, sizeable pleural effusion, or pneumothorax. Sequelae of prior CABG are identified, and right upper quadrant abdominal surgical clips are noted. No acute osseous abnormality is identified. IMPRESSION: 1. Support devices as above. 2. Pulmonary vascular congestion. Electronically Signed   By: Sebastian Ache M.D.   On: 11/25/2015 14:38    Diagnostic Studies/Procedures     Cardiac Catheterization: 11/25/2015     Prox LAD lesion, 80% stenosed.  Ost 1st Sept lesion, 100% stenosed.  1st Diag lesion, 80% stenosed.  Mid RCA lesion, 100% stenosed.  SVG .  100% occlusion, appears chronic  Origin lesion, 100% stenosed.  LIMA .  Widely patent LIMA-LAD  Prox Cx to Mid Cx lesion, 99% stenosed. Post intervention, there is a 0% residual stenosis. The lesion was previously treated with a stent (unknown type) greater than two years ago.  1. Severe three-vessel coronary artery disease with severe stenosis of the proximal left circumflex, total occlusion of the proximal LAD, and total occlusion of the mid RCA.  2. Status post aortocoronary bypass surgery with continued patency of  the LIMA to LAD and chronic total occlusion of the SVG to PDA  3. Extensive collaterals from the septal perforators of the LAD to the distal RCA territory  4. Moderate segmental LV systolic dysfunction consistent with the circumflex territory infarction  5. Successful PCI to left circumflex using a Promus DES  Recommendations: The patient will be continued on Angiomax for 2 hours after she is loaded with brilinta 180 mg. She will begin the hypothermia protocol per CCM. She is critically ill and will be transferred directly from the cardiac catheterization lab to the CCU.  Echocardiogram: 11/26/2015 Study Conclusions - Left ventricle: Distal septal and posterior lateral hypokinesis.  The cavity size  was mildly dilated. Wall thickness was normal.  Systolic function was mildly reduced. The estimated ejection  fraction was in the range of 45% to 50%. - Mitral valve: There was mild regurgitation. - Atrial septum: No defect or patent foramen ovale was identified.  Disposition   Pt is being discharged home today in good condition.  Follow-up Plans & Appointments    Follow-up Information    Follow up with Tereso Newcomer, PA-C On 12/06/2015.   Specialties:  Physician Assistant, Radiology, Interventional Cardiology   Why:  Cardiology Hospital Follow-Up on 12/06/2015 at 10:00AM.   Contact information:   [address] [place] [address] [phone]      Discharge Instructions    AMB Referral to Cardiac Rehabilitation - Phase II    Complete by:  As directed   Diagnosis:  Myocardial Infarction     Amb Referral to Cardiac Rehabilitation    Complete by:  As directed   Diagnosis:   PCI Myocardial Infarction       Diet - low sodium heart healthy    Complete by:  As directed      Discharge instructions    Complete by:  As directed   PLEASE REMEMBER TO BRING ALL OF YOUR MEDICATIONS TO EACH OF YOUR FOLLOW-UP OFFICE VISITS.  PLEASE ATTEND ALL SCHEDULED FOLLOW-UP APPOINTMENTS.   Activity: Increase activity slowly as tolerated. You may shower, but no soaking baths (or swimming) for 1 week. No driving for 1 week. No lifting over 10 lbs for 2 weeks. No sexual activity for 2 weeks.   You May Return to Work: in 1 week (if applicable)  Wound Care: You may wash cath site gently with soap and water. Keep cath site clean and dry. If you notice pain, swelling, bleeding or pus at your cath site, please call [phone].     Increase activity slowly    Complete by:  As directed            Discharge Medications   Discharge Medication List as of 11/29/2015 12:40 PM    CONTINUE these medications which have CHANGED   Details  atorvastatin (LIPITOR) 40 MG tablet Take 1 tablet  (40 mg total) by mouth daily., Starting 11/29/2015, Until Discontinued, Normal      CONTINUE these medications which have NOT CHANGED   Details  fentaNYL (DURAGESIC - DOSED MCG/HR) 25 MCG/HR patch Place 25 mcg onto the skin every other day., Until Discontinued, Historical Med    insulin lispro (HUMALOG KWIKPEN) 100 UNIT/ML KiwkPen 6-8 units before supper or large meals, Normal    Pseudoeph-Doxylamine-DM-APAP (NYQUIL PO) Take 10 mLs by mouth at bedtime as needed (sleep)., Until Discontinued, Historical Med    ticagrelor (BRILINTA) 90 MG TABS tablet Take by mouth 2 (two) times daily., Until Discontinued, Historical Med  tiotropium (SPIRIVA) 18 MCG inhalation capsule Place 18 mcg into inhaler and inhale daily as needed (shortness of breath). , Until Discontinued, Historical Med    albuterol (PROVENTIL HFA;VENTOLIN HFA) 108 (90 BASE) MCG/ACT inhaler Inhale 2 puffs into the lungs every 6 (six) hours as needed for wheezing or shortness of breath., Until Discontinued, Historical Med    aspirin EC 81 MG tablet Take 81 mg by mouth daily., Until Discontinued, Historical Med    carvedilol (COREG) 3.125 MG tablet Take 1 tablet (3.125 mg total) by mouth 2 (two) times daily with a meal., Starting 11/26/2014, Until Discontinued, Print    fenofibrate (TRICOR) 145 MG tablet Take 145 mg by mouth daily., Until Discontinued, Historical Med    Fluticasone-Salmeterol (ADVAIR) 100-50 MCG/DOSE AEPB Inhale 1 puff into the lungs 2 (two) times daily as needed (shortness of breath). , Until Discontinued, Historical Med    glucose blood (ONE TOUCH ULTRA TEST) test strip Use as instructed to check blood sugar 2 times per day. Dx code E11.8, Print    insulin glargine (LANTUS) 100 UNIT/ML injection Inject into the skin 2 (two) times daily. Inject 50 units in am and 40 units in pm, Until Discontinued, Historical Med    lisinopril (PRINIVIL,ZESTRIL) 2.5 MG tablet Take 1 tablet (2.5 mg total) by mouth daily., Starting  11/29/2014, Until Discontinued, Print    metFORMIN (GLUCOPHAGE-XR) 500 MG 24 hr tablet 2 tablets at supper, Normal    nitroGLYCERIN (NITROSTAT) 0.4 MG SL tablet Place 0.4 mg under the tongue., Until Discontinued, Historical Med    ONETOUCH DELICA LANCETS 33G MISC Use to check blood sugar 2 times per day dx code E11.8, Print      STOP taking these medications     insulin regular human CONCENTRATED (HUMULIN R) 500 UNIT/ML kwikpen      loperamide (IMODIUM) 2 MG capsule            Allergies Allergies  Allergen Reactions  . Wellbutrin [Bupropion] Other (See Comments) and Palpitations    "Makes me feel like im having a heart attack" SEVERE CHEST PAIN     Outstanding Labs/Studies   None  Duration of Discharge Encounter   Greater than 30 minutes including physician time.  Signed, Ellsworth Lennox, PA-C 11/29/2015, 3:07 PM

## 2015-11-29 NOTE — Progress Notes (Signed)
SUBJECTIVE: Mild chest pain to touch and movement  BP 124/70 mmHg  Pulse 80  Temp(Src) 97.6 F (36.4 C) (Oral)  Resp 18  Ht 5\' 2"  (1.575 m)  Wt 194 lb 8 oz (88.225 kg)  BMI 35.57 kg/m2  SpO2 95%  Intake/Output Summary (Last 24 hours) at 11/29/15 1014 Last data filed at 11/28/15 2100  Gross per 24 hour  Intake    720 ml  Output      0 ml  Net    720 ml    PHYSICAL EXAM General: Well developed, well nourished, in no acute distress. Alert and oriented x 3.  Psych:  Good affect, responds appropriately Neck: No JVD. No masses noted.  Lungs: Clear bilaterally with no wheezes or rhonci noted.  Heart: RRR with no murmurs noted. Abdomen: Bowel sounds are present. Soft, non-tender.  Extremities: No lower extremity edema.   LABS: Basic Metabolic Panel:  Recent Labs  65/68/12 0251 11/29/15 0423  NA 142 141  K 3.6 3.7  CL 109 108  CO2 23 24  GLUCOSE 203* 206*  BUN 7 8  CREATININE 0.57 0.56  CALCIUM 8.9 8.9   CBC:  Recent Labs  11/27/15 0217  WBC 12.2*  HGB 12.7  HCT 38.1  MCV 90.5  PLT 146*   Current Meds: . antiseptic oral rinse  7 mL Mouth Rinse BID  . aspirin  81 mg Oral Daily  . carvedilol  3.125 mg Oral BID WC  . Chlorhexidine Gluconate Cloth  6 each Topical Q0600  . fentaNYL  25 mcg Transdermal Q72H  . insulin aspart  0-20 Units Subcutaneous TID WC  . insulin aspart  0-5 Units Subcutaneous QHS  . insulin aspart  5 Units Subcutaneous TID WC  . insulin glargine  18 Units Subcutaneous Daily  . mupirocin ointment  1 application Nasal BID  . pantoprazole  40 mg Oral Daily  . sodium chloride flush  3 mL Intravenous Q12H  . ticagrelor  90 mg Oral BID     ASSESSMENT AND PLAN:  1. Cardiac Arrest: Occurring in the setting of circumflex MI. History of CAD (s/p CABG in 2016 w/ LIMA-LAD, SVG-PDA) - cath this admission showed patent LIMA-LAD, occlusion of the SVG-PDA, and 99% stenosis of the Prox-Mid CX (successful PCI w/ DES placed). Will continue DAPT  with ASA and Brilinta. Will continue beta blocker. Statin currently held due to significant transaminitis but now that LfTs are normal, will restart Lipitor 40 mg daily.  - She is reporting chest pain that is reproducible on palpation, worse with deep breathing, likely secondary to CPR she required at time of admission. Was on a Fentanyl patch PTA for chronic pain and is receiving Fentanyl and Percocet at this time.   2. Uncontrolled IDDM: A1c elevated to 11.8. Glucose initially in the 370's. Has improved into the 160's - 220's yesterday. She was on 3 different types of Insulin prior to admission. Lantus increased. Appreciate Diabetes Coordinator recommendations.  3. COPD: Continue with Duonebs and PRN Albuterol with plan to resume PTA Advair and Spiriva at time of discharge per CCM recommendations.  4. HTN: BP controlled.   5. HLD:  Not currently on statin at this time. LFT's elevated at time of admission (AST 338, ALT 256). Now normal. Will restart Lipitor at 40 mg daily. Needs repeat LFTs as outpt.   D/c home today. Follow up Dr. Excell Seltzer or office APP in 1-2 weeks.    Sara Hobbs  3/28/201710:14 AM

## 2015-11-29 NOTE — Telephone Encounter (Signed)
TCM phone call.Marland Kitchen Appt is on 12/06/15 at 10am w/ Tereso Newcomer at the [facility] office .Marland Kitchen Thanks

## 2015-11-29 NOTE — Progress Notes (Signed)
Removed IV. Reviewed discharge instructions with patient and family. Family requested extra pain meds for pt until they could see the pain md cardiology refused. Awaiting wheelchair for discharge.  Lorimer Tiberio, Charlaine Dalton RN

## 2015-11-29 NOTE — Telephone Encounter (Signed)
3/28 patient currently admitted-[place] RN

## 2015-11-29 NOTE — Progress Notes (Signed)
Inpatient Diabetes Program Recommendations  AACE/ADA: New Consensus Statement on Inpatient Glycemic Control (2015)  Target Ranges:  Prepandial:   less than 140 mg/dL      Peak postprandial:   less than 180 mg/dL (1-2 hours)      Critically ill patients:  140 - 180 mg/dL    Review of Glycemic Control  Diabetes history: DM2 Outpatient Diabetes medications: Lantus 50 units QAM,, Lantus 40 units QPM, Humalog 6-8 units TID with large meals, Metformin XR 500 mg BID (also has Humulin R U500 on home medication list with not that patient is not taking) Current orders for Inpatient glycemic control: Lantus 18 units dialy, Novolog 5 units TID with meals, Novolog 0-20 units TID with meals, Novolog 0-5 units QHS    Inpatient Diabetes Program Recommendations: Insulin - Basal: Please consider increasing Lantus to 22 units daily. Insulin - Meal Coverage: In reviewing the chart, noted patient did not receive any meal coverage for supper last night despite recorded meal intake of 75%. As a result glucose was up to 327 mg/dl at 48:88 last night.  Please consider increasing meal coverage to Novolog 8 units TID with meals. NURSING: Please administer meal coverage insulin when parameters are meet (eats at least 50% of meals, pre-meal glucose >80 mg/dl).   Thanks, Orlando Penner, RN, MSN, CDE Diabetes Coordinator Inpatient Diabetes Program [phone] (Team Pager from 8am to 5pm) [phone] ([facility] office) [phone] [facility] office) [phone] [facility] office)

## 2015-11-30 LAB — CULTURE, BLOOD (ROUTINE X 2)
CULTURE: NO GROWTH
CULTURE: NO GROWTH

## 2015-11-30 NOTE — Telephone Encounter (Signed)
Pt was d/c yesterday 3/28 but no answer when tried to call.

## 2015-12-01 NOTE — Telephone Encounter (Signed)
Left message with person who answered pt's mobile number to have pt call office.

## 2015-12-02 NOTE — Telephone Encounter (Signed)
Patient contacted regarding discharge from [facility] on 11/29/15.  Patient understands to follow up with provider on 4/4 at 10 am  at [facility] office.  Patient understands discharge instructions?  Yes  Patient understands medications and regiment?  Yes  Patient understands to bring all medications to this visit?  Yes  Other details:

## 2015-12-05 NOTE — Progress Notes (Signed)
Cardiology Office Note:    Date:  12/06/2015   ID:  Sara Hobbs, DOB [birth year], MRN [id number]  PCP:  Lavell Islam, MD  Cardiologist:  Previous Dr. Judithe Modest >> Dr. Tonny Bollman   Electrophysiologist:  n/a  Chief Complaint  Patient presents with  . Hospitalization Follow-up    ACS/PEA arrest >> PCI of LCx    History of Present Illness:     Sara Hobbs is a 55 y.o. female with a hx of CAD status post CABG, COPD, diabetes, HTN, HL, chronic pain. Previously followed in [facility].    Admitted 3/24-3/28.  Presented with chest pain. While in triage she became unresponsive and developed PEA arrest. CPR/ACLS was initiated with ROSC.  Troponin increased to 0.9 and ECG demonstrated new RBBB. LHC demonstrated 3 vessel CAD with severe stenosis of the proximal LCx, patent LIMA-LAD and chronic occlusion of the SVG-PDA. She had PCI with DES native LCx. Post PCI, she did have some chest wall pain related to CPR. No obvious fx on CXRs.  She was asked to follow-up with her pain management provider for refills on Fentanyl patch. Lipitor dose was reduced to 40 mg daily. She was discharged on Brilinta, aspirin, carvedilol, lisinopril.  Returns for FU.  Here with her husband who offers a lot of the patient's history today.  Of note, the patient is able to provide accurate history.  She continues to have MSK type chest pain worse with movement or lying down.  This is consistent with the pain she had in the hospital.  She notes some difficulty breathing when lying flat.  She denies recurrent anginal symptoms.  Denies PND, edema.  Denies syncope.  She is not doing any walking for exercise and declines cardiac rehab.  She continues to smoke.  She has a chronic cough that is NP.  Denies any bleeding issues.     Past Medical History  Diagnosis Date  . Chronic shoulder pain     "right; I've got bad rotator cuff"  . COPD (chronic obstructive pulmonary disease) (HCC)   . CAD, multiple vessel   .  Hyperlipidemia   . GERD (gastroesophageal reflux disease)   . Chest tightness   . Angina pectoris (HCC)   . Wears glasses   . Anxiety   . Depression   . History of hiatal hernia   . CAD (coronary artery disease)     a. 09/2014: LIMA to LAD, SVG to PDA, EVH via right thigh b. Cardiac Arrest 11/2015: patent LIMA-LAD, CTO if SVG-PDA, 99% stenosis of Prox-Mid Cx w/ DES placed  . Myocardial infarction (HCC) 11/2013    PCI w/ drug eluting stent LCx  . Asthma   . Hypothyroidism     PMH:  . IDDM (insulin dependent diabetes mellitus) (HCC)   . Diabetes mellitus with complication (HCC)   . Neuropathy due to secondary diabetes (HCC)   . History of blood transfusion     "when I had my 1st youngun; when I had MI"  . History of stomach ulcers   . Headache     "monthly" (11/22/2014)  . Arthritis     "hands, neck, back, knees, hips, ankles" (11/22/2014)  . Chronic lower back pain   . Incidental lung nodule, > 3mm and < 8mm 11/22/2014    Small non-calcified nodules left lung  . Cardiac arrest (HCC) 11/25/2015    Past Surgical History  Procedure Laterality Date  . Cesarean section  1982; 1984  . Gastric resection  1990's  bezoar; "for reflux"  . Coronary angioplasty with stent placement  11/11/2013    Drug-eluting stent in LCx  . Breast surgery Left 1990's    breast duct surgery  . Colonoscopy    . Esophagogastroduodenoscopy    . Coronary artery bypass graft N/A 09/22/2014    Procedure: CORONARY ARTERY BYPASS GRAFTING (CABG);  Surgeon: Purcell Nails, MD;  Location: [facility] OR;  Service: Open Heart Surgery;  Laterality: N/A;  Times 2 using left internal mammary artery and endoscopically harvested right saphenous vein  . Tee without cardioversion N/A 09/22/2014    Procedure: TRANSESOPHAGEAL ECHOCARDIOGRAM (TEE);  Surgeon: Purcell Nails, MD;  Location: [facility] OR;  Service: Open Heart Surgery;  Laterality: N/A;  . Laparoscopic cholecystectomy  2000  . Abdominal hysterectomy  1990's  . Cardiac  catheterization  09/01/14  . Sternal wound debridement N/A 11/23/2014    Procedure: SUPERFICIAL STERNAL WOUND DEBRIDEMENT;  Surgeon: Purcell Nails, MD;  Location: [facility] OR;  Service: Thoracic;  Laterality: N/A;  . Application of wound vac N/A 11/23/2014    Procedure: POSSIBLE APPLICATION OF WOUND VAC;  Surgeon: Purcell Nails, MD;  Location: [facility] OR;  Service: Thoracic;  Laterality: N/A;  . Cardiac catheterization N/A 11/25/2015    Procedure: Left Heart Cath and Cors/Grafts Angiography;  Surgeon: Tonny Bollman, MD;  Location: [facility] INVASIVE CV LAB;  Service: Cardiovascular;  Laterality: N/A;  . Cardiac catheterization N/A 11/25/2015    Procedure: Coronary Stent Intervention;  Surgeon: Tonny Bollman, MD;  Location: [facility] INVASIVE CV LAB;  Service: Cardiovascular;  Laterality: N/A;    Current Medications: Outpatient Prescriptions Prior to Visit  Medication Sig Dispense Refill  . albuterol (PROVENTIL HFA;VENTOLIN HFA) 108 (90 BASE) MCG/ACT inhaler Inhale 2 puffs into the lungs every 6 (six) hours as needed for wheezing or shortness of breath.    Marland Kitchen aspirin EC 81 MG tablet Take 81 mg by mouth daily.    . carvedilol (COREG) 3.125 MG tablet Take 1 tablet (3.125 mg total) by mouth 2 (two) times daily with a meal. 60 tablet 1  . fentaNYL (DURAGESIC - DOSED MCG/HR) 25 MCG/HR patch Place 25 mcg onto the skin every other day.    . Fluticasone-Salmeterol (ADVAIR) 100-50 MCG/DOSE AEPB Inhale 1 puff into the lungs 2 (two) times daily as needed (shortness of breath).     Marland Kitchen glucose blood (ONE TOUCH ULTRA TEST) test strip Use as instructed to check blood sugar 2 times per day. Dx code E11.8 100 each 3  . insulin glargine (LANTUS) 100 UNIT/ML injection Inject into the skin 2 (two) times daily. Inject 50 units in am and 40 units in pm    . insulin lispro (HUMALOG KWIKPEN) 100 UNIT/ML KiwkPen 6-8 units before supper or large meals 15 mL 1  . ONETOUCH DELICA LANCETS 33G MISC Use to check blood sugar 2 times per day dx code  E11.8 100 each 3  . ticagrelor (BRILINTA) 90 MG TABS tablet Take by mouth 2 (two) times daily.    Marland Kitchen tiotropium (SPIRIVA) 18 MCG inhalation capsule Place 18 mcg into inhaler and inhale daily as needed (shortness of breath).     Marland Kitchen lisinopril (PRINIVIL,ZESTRIL) 2.5 MG tablet Take 1 tablet (2.5 mg total) by mouth daily. 30 tablet 3  . nitroGLYCERIN (NITROSTAT) 0.4 MG SL tablet Place 0.4 mg under the tongue every 5 (five) minutes as needed for chest pain.     Marland Kitchen atorvastatin (LIPITOR) 40 MG tablet Take 1 tablet (40 mg total) by mouth daily. (Patient  not taking: Reported on 12/06/2015) 30 tablet 6  . fenofibrate (TRICOR) 145 MG tablet Take 145 mg by mouth daily.    . metFORMIN (GLUCOPHAGE-XR) 500 MG 24 hr tablet 2 tablets at supper (Patient not taking: Reported on 12/06/2015) 60 tablet 3  . Pseudoeph-Doxylamine-DM-APAP (NYQUIL PO) Take 10 mLs by mouth at bedtime as needed (sleep). Reported on 12/06/2015     No facility-administered medications prior to visit.     Allergies:   Wellbutrin   Social History   Social History  . Marital Status: Married    Spouse Name: N/A  . Number of Children: N/A  . Years of Education: N/A   Social History Main Topics  . Smoking status: Current Every Day Smoker -- 2.00 packs/day for 41 years    Types: Cigarettes  . Smokeless tobacco: Former Neurosurgeon    Types: Chew    Quit date: 09/13/2005  . Alcohol Use: No  . Drug Use: No  . Sexual Activity: Yes   Other Topics Concern  . None   Social History Narrative     Family History:  The patient's She was adopted. Family history is unknown by patient.   ROS:   Please see the history of present illness.    Review of Systems  Respiratory: Positive for cough.   Psychiatric/Behavioral: The patient is nervous/anxious.    All other systems reviewed and are negative.   Physical Exam:    VS:  BP 130/62 mmHg  Pulse 78  Ht 5\' 2"  (1.575 m)  Wt 192 lb 12.8 oz (87.454 kg)  BMI 35.25 kg/m2  SpO2 96%   GEN: Well  nourished, well developed, in no acute distress HEENT: normal Neck: no JVD, no masses Cardiac: Normal S1/S2,  RRR; no murmurs, rubs, or gallops, no edema;  right wrist without hematoma or mass  Chest - tender to palpation bilateral, no obvious deformity   Respiratory:  Decrease breath sounds bilaterally; no wheezing, rhonchi or rales GI: soft, nontender, nondistended MS: no deformity or atrophy Skin: warm and dry Neuro: No focal deficits  Psych: Alert and oriented x 3, normal affect  Wt Readings from Last 3 Encounters:  12/06/15 192 lb 12.8 oz (87.454 kg)  11/29/15 194 lb 8 oz (88.225 kg)  07/25/15 205 lb 4 oz (93.101 kg)      Studies/Labs Reviewed:     EKG:  EKG is  ordered today.  The ekg ordered today demonstrateNSR, HR 78, nonspecific ST-T wave changes, QTc 424 ms   Recent Labs: 11/25/2015: B Natriuretic Peptide 404.1*; Magnesium 1.9 11/27/2015: Hemoglobin 12.7; Platelets 146* 11/29/2015: ALT 51; BUN 8; Creatinine, Ser 0.56; Potassium 3.7; Sodium 141   Recent Lipid Panel    Component Value Date/Time   CHOL 104 07/21/2015 1417   TRIG 337* 11/25/2015 1859   HDL 27.80* 07/21/2015 1417   CHOLHDL 4 07/21/2015 1417   VLDL 32.4 07/21/2015 1417   LDLCALC 44 07/21/2015 1417   LDLDIRECT 152.0 04/20/2015 1450    Additional studies/ records that were reviewed today include:   Echo 11/26/15 Distal septal and posterior lateral HK, EF 45-50%, mild MR  LHC 11/25/15 LM normal LAD proximal 80%, D1 80%, ostial first septal branch 100% LCx proximal 99% ISR RCA mid 100% with left-right collaterals SVG-RPDA 100% LIMA-LAD patent EF 45-50%, distal inferior akinesis, basal inferior hypokinesis, moderate MR PCI: 2.75 x 12 mm Promus DES to the LCx 1. Severe three-vessel coronary artery disease with severe stenosis of the proximal left circumflex, total occlusion of the  proximal LAD, and total occlusion of the mid RCA. 2. Status post aortocoronary bypass surgery with continued patency of  the LIMA to LAD and chronic total occlusion of the SVG to PDA 3. Extensive collaterals from the septal perforators of the LAD to the distal RCA territory 4. Moderate segmental LV systolic dysfunction consistent with the circumflex territory infarction 5. Successful PCI to left circumflex using a Promus DES       Carotid US 1/16 Bilateral - 1% to 39% ICA stenosis    ASSESSMENT:     1. Coronary artery disease involving native coronary artery of native heart without angina pectoris   2. Other chest pain   3. Ischemic cardiomyopathy   4. Mitral regurgitation   5. Essential hypertension   6. Hyperlipidemia   7. Tobacco abuse     PLAN:     In order of problems listed above:  1. CAD - s/p CABG.  Recent ACS with PEA arrest with LHC demonstrating severe CAD with patent L-LAD, occluded LAD and RCA and an occluded S-PDA.  There were extensive L-R collaterals.  She has sig stenosis in the native LCx which was treated with DES.  She understands the importance of dual antiplatelet Rx.  She has been on Brilinta and ASA long term. She is not interested in formal cardiac rehab.  -  Continue ASA, Brilinta, statin, beta-blocker, ACE inhibitor.  -  If she changes her mind, refer to [facility]  -  DC cigs.  2. Chest pain - She has a lot of MSK pain related to CPR from her arrest.  No obvious fx on CXR and sternal wires appear to be intact upon my review.  I reassured her that this will resolve over time. She does have a pain management provider that fills her pain medications.  Of note, no effusion on echo post MI.  3. Ischemic CM - EF 45-50% by echo post MI.  She has dyspnea that seems to be related to her pain from the CPR.  I will get a BMET, BNP today.  If BNP up, add Lasix.  Continue beta-blocker, ACE inhibitor.  EF is > 40%.  No need for Spironolactone.   4. Mitral regurgitation - Mild by post MI echo.  5. HTN - Controlled.   6. HL - Statin held during admit due to elevated LFTs.  LFTs normal at DC.   Lipitor 40 resumed.  Check Lipids and LFTs in 6 weeks.    7. Tobacco abuse - She is not ready to quit.    Medication Adjustments/Labs and Tests Ordered: Current medicines are reviewed at length with the patient today.  Concerns regarding medicines are outlined above.  Medication changes, Labs and Tests ordered today are outlined in the Patient Instructions noted below. Patient Instructions  Medication Instructions:  1. A REFILL FOR NTG, LISINOPRIL HAVE BEEN SENT IN 2. A NEW RX FOR LIPITOR 40 MG HAS BEEN SENT IN AS WELL  Labwork: 1. TODAY BMET, BNP 2. IN 6-8 WEEKS YOU WILL NEED A FASTING CHOLESTEROL PANEL  Testing/Procedures: NONE  Follow-Up: DR. Excell Seltzer IN 2 MONTHS  Any Other Special Instructions Will Be Listed Below (If Applicable).  If you need a refill on your cardiac medications before your next appointment, please call your pharmacy.     Signed, Tereso Newcomer, PA-C  12/06/2015 4:07 PM    [facility] [address] [place], [place], [place]  [address] Phone: [phone]; Fax: [phone]

## 2015-12-06 ENCOUNTER — Encounter: Payer: Self-pay | Admitting: Physician Assistant

## 2015-12-06 ENCOUNTER — Telehealth: Payer: Self-pay | Admitting: *Deleted

## 2015-12-06 ENCOUNTER — Ambulatory Visit (INDEPENDENT_AMBULATORY_CARE_PROVIDER_SITE_OTHER): Payer: BLUE CROSS/BLUE SHIELD | Admitting: Physician Assistant

## 2015-12-06 VITALS — BP 130/62 | HR 78 | Ht 62.0 in | Wt 192.8 lb

## 2015-12-06 DIAGNOSIS — E785 Hyperlipidemia, unspecified: Secondary | ICD-10-CM

## 2015-12-06 DIAGNOSIS — I2510 Atherosclerotic heart disease of native coronary artery without angina pectoris: Secondary | ICD-10-CM

## 2015-12-06 DIAGNOSIS — I340 Nonrheumatic mitral (valve) insufficiency: Secondary | ICD-10-CM

## 2015-12-06 DIAGNOSIS — I255 Ischemic cardiomyopathy: Secondary | ICD-10-CM | POA: Diagnosis not present

## 2015-12-06 DIAGNOSIS — I10 Essential (primary) hypertension: Secondary | ICD-10-CM

## 2015-12-06 DIAGNOSIS — R0789 Other chest pain: Secondary | ICD-10-CM

## 2015-12-06 DIAGNOSIS — Z720 Tobacco use: Secondary | ICD-10-CM

## 2015-12-06 LAB — BASIC METABOLIC PANEL
BUN: 13 mg/dL (ref 7–25)
CALCIUM: 10 mg/dL (ref 8.6–10.4)
CHLORIDE: 98 mmol/L (ref 98–110)
CO2: 28 mmol/L (ref 20–31)
CREATININE: 0.72 mg/dL (ref 0.50–1.05)
Glucose, Bld: 229 mg/dL — ABNORMAL HIGH (ref 65–99)
Potassium: 4.2 mmol/L (ref 3.5–5.3)
SODIUM: 137 mmol/L (ref 135–146)

## 2015-12-06 LAB — BRAIN NATRIURETIC PEPTIDE: Brain Natriuretic Peptide: 36.2 pg/mL (ref <100)

## 2015-12-06 MED ORDER — NITROGLYCERIN 0.4 MG SL SUBL: 0.4000 mg | SUBLINGUAL_TABLET | SUBLINGUAL | Status: DC | PRN

## 2015-12-06 MED ORDER — LISINOPRIL 2.5 MG PO TABS: 2.5000 mg | ORAL_TABLET | Freq: Every day | ORAL | Status: DC

## 2015-12-06 MED ORDER — ATORVASTATIN CALCIUM 40 MG PO TABS: 40.0000 mg | ORAL_TABLET | Freq: Every day | ORAL | Status: DC

## 2015-12-06 NOTE — Telephone Encounter (Signed)
Lmtcb for lab results 

## 2015-12-06 NOTE — Patient Instructions (Addendum)
Medication Instructions:  1. A REFILL FOR NTG, LISINOPRIL HAVE BEEN SENT IN 2. A NEW RX FOR LIPITOR 40 MG HAS BEEN SENT IN AS WELL  Labwork: 1. TODAY BMET, BNP 2. IN 6-8 WEEKS YOU WILL NEED A FASTING CHOLESTEROL PANEL  Testing/Procedures: NONE  Follow-Up: DR. Excell Seltzer IN 2 MONTHS  Any Other Special Instructions Will Be Listed Below (If Applicable).  If you need a refill on your cardiac medications before your next appointment, please call your pharmacy.

## 2016-01-24 ENCOUNTER — Other Ambulatory Visit: Payer: Self-pay | Admitting: Internal Medicine

## 2016-01-24 DIAGNOSIS — Z1231 Encounter for screening mammogram for malignant neoplasm of breast: Secondary | ICD-10-CM

## 2016-01-31 ENCOUNTER — Telehealth: Payer: Self-pay | Admitting: *Deleted

## 2016-01-31 ENCOUNTER — Other Ambulatory Visit (INDEPENDENT_AMBULATORY_CARE_PROVIDER_SITE_OTHER): Payer: BLUE CROSS/BLUE SHIELD | Admitting: *Deleted

## 2016-01-31 DIAGNOSIS — E785 Hyperlipidemia, unspecified: Secondary | ICD-10-CM | POA: Diagnosis not present

## 2016-01-31 LAB — LIPID PANEL
CHOL/HDL RATIO: 4.5 ratio (ref <=5.0)
CHOLESTEROL: 140 mg/dL (ref 125–200)
HDL: 31 mg/dL — ABNORMAL LOW (ref >=46)
LDL CALC: 52 mg/dL (ref <130)
Triglycerides: 283 mg/dL — ABNORMAL HIGH (ref <150)
VLDL: 57 mg/dL — AB (ref <30)

## 2016-01-31 LAB — HEPATIC FUNCTION PANEL
ALT: 46 U/L — AB (ref 6–29)
AST: 20 U/L (ref 10–35)
Albumin: 4.4 g/dL (ref 3.6–5.1)
Alkaline Phosphatase: 156 U/L — ABNORMAL HIGH (ref 33–130)
BILIRUBIN DIRECT: 0.1 mg/dL (ref <=0.2)
BILIRUBIN TOTAL: 0.5 mg/dL (ref 0.2–1.2)
Indirect Bilirubin: 0.4 mg/dL (ref 0.2–1.2)
Total Protein: 6.8 g/dL (ref 6.1–8.1)

## 2016-01-31 NOTE — Telephone Encounter (Signed)
Lmtcb to go over lab results and recommendations.  

## 2016-02-01 ENCOUNTER — Encounter: Payer: Self-pay | Admitting: Cardiovascular Disease

## 2016-02-01 NOTE — Telephone Encounter (Signed)
Lmtcb x 2

## 2016-02-02 NOTE — Telephone Encounter (Signed)
New Message  Pt requesting to speak RN about recent lab results. Please call back to discuss

## 2016-02-03 NOTE — Telephone Encounter (Signed)
No answer on [phone]; lmtcb on [phone] x 3 to go over lab results and recommendations.

## 2016-02-06 ENCOUNTER — Ambulatory Visit
Admission: RE | Admit: 2016-02-06 | Discharge: 2016-02-06 | Disposition: A | Discharge status: Home / Self Care | Payer: BLUE CROSS/BLUE SHIELD | Source: Ambulatory Visit | Attending: Internal Medicine | Admitting: Internal Medicine

## 2016-02-06 ENCOUNTER — Ambulatory Visit (INDEPENDENT_AMBULATORY_CARE_PROVIDER_SITE_OTHER): Payer: BLUE CROSS/BLUE SHIELD | Admitting: Cardiovascular Disease

## 2016-02-06 ENCOUNTER — Encounter: Payer: Self-pay | Admitting: Cardiovascular Disease

## 2016-02-06 VITALS — BP 102/62 | HR 82 | Ht 63.0 in | Wt 187.1 lb

## 2016-02-06 DIAGNOSIS — Z1231 Encounter for screening mammogram for malignant neoplasm of breast: Secondary | ICD-10-CM

## 2016-02-06 DIAGNOSIS — R748 Abnormal levels of other serum enzymes: Secondary | ICD-10-CM | POA: Diagnosis not present

## 2016-02-06 DIAGNOSIS — I25810 Atherosclerosis of coronary artery bypass graft(s) without angina pectoris: Secondary | ICD-10-CM | POA: Diagnosis not present

## 2016-02-06 NOTE — Patient Instructions (Signed)
Medication Instructions:  Your physician recommends that you continue on your current medications as directed. Please refer to the Current Medication list given to you today.  Labwork: Your physician recommends that you return for lab work in: 2 MONTHS (LIVER Panel)  Testing/Procedures: No new orders.  Follow-Up: Your physician recommends that you schedule a follow-up appointment in: 3 MONTHS with Tereso Newcomer PA-C  Your physician wants you to follow-up in: 6 MONTHS with Dr Excell Seltzer. You will receive a reminder letter in the mail two months in advance. If you don't receive a letter, please call our office to schedule the follow-up appointment.    Any Other Special Instructions Will Be Listed Below (If Applicable).     If you need a refill on your cardiac medications before your next appointment, please call your pharmacy.

## 2016-02-06 NOTE — Progress Notes (Signed)
Cardiology Office Note Date:  02/07/2016   ID:  Sara Hobbs, DOB 07-24-1961, MRN [id number]  PCP:  Lavell Islam, MD  Cardiologist:  Tonny Bollman, MD    Chief Complaint  Patient presents with  . Follow-up    2 month   History of Present Illness: Sara Hobbs is a 55 y.o. female who presents for follow-up evaluation. The patient has a complex medical history with coronary artery disease and remote CABG, COPD, diabetes, hypertension, hyperlipidemia, and chronic pain syndrome. She presented 11/25/2015 with chest pain, shortness of breath, and PEA arrest in the emergency room. She was resuscitated and underwent emergency cardiac catheterization showing three-vessel coronary artery disease with severe stenosis of the proximal left circumflex. She had patency of the LIMA to LAD graft and chronic occlusion of the vein graft to PDA. She underwent PCI of the native left circumflex without complication. She's had a lot of musculoskeletal pain after CPR.  She remembers all of the events leading up to her cardiac arrest. She was experiencing frequent chest pain and taking NTG for several weeks. She developed severe shortness of breath and felt like she was going to die. She gathered the strength to walk outside and say goodbye to her husband, and collapsed in his arms. He rushed her to the hospital and she arrested on arrival.  The patient is here with her husband today. She is concerned because they may have to start traveling for his work and she is anxious about that. Overall she is feeling better and her chest wall pain is improving. She's had no anginal chest pain and has not required nitroglycerin. Shortness of breath is unchanged with activity. She continues to smoke 1-1/2 packs of cigarettes per day, down from 2 packs per day previously. She has set a goal of quitting next month on her daughter's birthday.  Past Medical History  Diagnosis Date  . Chronic shoulder pain     "right; I've  got bad rotator cuff"  . COPD (chronic obstructive pulmonary disease) (HCC)   . CAD, multiple vessel   . Hyperlipidemia   . GERD (gastroesophageal reflux disease)   . Chest tightness   . Angina pectoris (HCC)   . Wears glasses   . Anxiety   . Depression   . History of hiatal hernia   . CAD (coronary artery disease)     a. 09/2014: LIMA to LAD, SVG to PDA, EVH via right thigh b. Cardiac Arrest 11/2015: patent LIMA-LAD, CTO if SVG-PDA, 99% stenosis of Prox-Mid Cx w/ DES placed  . Myocardial infarction (HCC) 11/2013    PCI w/ drug eluting stent LCx  . Asthma   . Hypothyroidism     PMH:  . IDDM (insulin dependent diabetes mellitus) (HCC)   . Diabetes mellitus with complication (HCC)   . Neuropathy due to secondary diabetes (HCC)   . History of blood transfusion     "when I had my 1st youngun; when I had MI"  . History of stomach ulcers   . Headache     "monthly" (11/22/2014)  . Arthritis     "hands, neck, back, knees, hips, ankles" (11/22/2014)  . Chronic lower back pain   . Incidental lung nodule, > 3mm and < 8mm 11/22/2014    Small non-calcified nodules left lung  . Cardiac arrest (HCC) 11/25/2015    Past Surgical History  Procedure Laterality Date  . Cesarean section  1982; 1984  . Gastric resection  1990's    bezoar; "for reflux"  .  Coronary angioplasty with stent placement  11/11/2013    Drug-eluting stent in LCx  . Breast surgery Left 1990's    breast duct surgery  . Colonoscopy    . Esophagogastroduodenoscopy    . Coronary artery bypass graft N/A 09/22/2014    Procedure: CORONARY ARTERY BYPASS GRAFTING (CABG);  Surgeon: Purcell Nails, MD;  Location: [facility] OR;  Service: Open Heart Surgery;  Laterality: N/A;  Times 2 using left internal mammary artery and endoscopically harvested right saphenous vein  . Tee without cardioversion N/A 09/22/2014    Procedure: TRANSESOPHAGEAL ECHOCARDIOGRAM (TEE);  Surgeon: Purcell Nails, MD;  Location: [facility] OR;  Service: Open Heart Surgery;   Laterality: N/A;  . Laparoscopic cholecystectomy  2000  . Abdominal hysterectomy  1990's  . Cardiac catheterization  09/01/14  . Sternal wound debridement N/A 11/23/2014    Procedure: SUPERFICIAL STERNAL WOUND DEBRIDEMENT;  Surgeon: Purcell Nails, MD;  Location: [facility] OR;  Service: Thoracic;  Laterality: N/A;  . Application of wound vac N/A 11/23/2014    Procedure: POSSIBLE APPLICATION OF WOUND VAC;  Surgeon: Purcell Nails, MD;  Location: [facility] OR;  Service: Thoracic;  Laterality: N/A;  . Cardiac catheterization N/A 11/25/2015    Procedure: Left Heart Cath and Cors/Grafts Angiography;  Surgeon: Tonny Bollman, MD;  Location: [facility] INVASIVE CV LAB;  Service: Cardiovascular;  Laterality: N/A;  . Cardiac catheterization N/A 11/25/2015    Procedure: Coronary Stent Intervention;  Surgeon: Tonny Bollman, MD;  Location: [facility] INVASIVE CV LAB;  Service: Cardiovascular;  Laterality: N/A;    Current Outpatient Prescriptions  Medication Sig Dispense Refill  . albuterol (PROVENTIL HFA;VENTOLIN HFA) 108 (90 BASE) MCG/ACT inhaler Inhale 2 puffs into the lungs every 6 (six) hours as needed for wheezing or shortness of breath.    Marland Kitchen aspirin EC 81 MG tablet Take 81 mg by mouth daily.    Marland Kitchen atorvastatin (LIPITOR) 40 MG tablet Take 1 tablet (40 mg total) by mouth daily at 6 PM. 90 tablet 3  . carvedilol (COREG) 3.125 MG tablet Take 1 tablet (3.125 mg total) by mouth 2 (two) times daily with a meal. 60 tablet 1  . fentaNYL (DURAGESIC - DOSED MCG/HR) 12 MCG/HR APPLY 1 PATCH TOPICALLY TO SKIN Q 72 H  0  . fentaNYL (DURAGESIC - DOSED MCG/HR) 25 MCG/HR patch Place 25 mcg onto the skin every other day.    . Fluticasone-Salmeterol (ADVAIR) 100-50 MCG/DOSE AEPB Inhale 1 puff into the lungs 2 (two) times daily as needed (shortness of breath).     . insulin glargine (LANTUS) 100 UNIT/ML injection Inject 55 units into skin every morning and 40 units into skin every evening.    . insulin lispro (HUMALOG KWIKPEN) 100 UNIT/ML KiwkPen 6-8  units before supper or large meals 15 mL 1  . lisinopril (PRINIVIL,ZESTRIL) 2.5 MG tablet Take 1 tablet (2.5 mg total) by mouth daily. 90 tablet 3  . nitroGLYCERIN (NITROSTAT) 0.4 MG SL tablet Place 1 tablet (0.4 mg total) under the tongue every 5 (five) minutes as needed for chest pain. 25 tablet 3  . sertraline (ZOLOFT) 50 MG tablet Take 50 mg by mouth daily.    . ticagrelor (BRILINTA) 90 MG TABS tablet Take 90 mg by mouth 2 (two) times daily.     Marland Kitchen tiotropium (SPIRIVA) 18 MCG inhalation capsule Place 18 mcg into inhaler and inhale daily as needed (shortness of breath).     . traZODone (DESYREL) 50 MG tablet Take 50 mg by mouth at bedtime.  No current facility-administered medications for this visit.    Allergies:   Wellbutrin   Social History:  The patient  reports that she has been smoking Cigarettes.  She has a 82 pack-year smoking history. She quit smokeless tobacco use about 10 years ago. Her smokeless tobacco use included Chew. She reports that she does not drink alcohol or use illicit drugs.   Family History:  The patient's She was adopted. Family history is unknown by patient.   ROS:  Please see the history of present illness.  Otherwise, review of systems is positive for cough, depression, dizziness, anxiety, headaches.  All other systems are reviewed and negative.   PHYSICAL EXAM: VS:  BP 102/62 mmHg  Pulse 82  Ht 5\' 3"  (1.6 m)  Wt 187 lb 1.9 oz (84.877 kg)  BMI 33.16 kg/m2 , BMI Body mass index is 33.16 kg/(m^2). GEN: Well nourished, well developed, obese woman in no acute distress HEENT: normal Neck: no JVD, no masses. No carotid bruits Cardiac: RRR without murmur or gallop                Respiratory:  clear to auscultation bilaterally, normal work of breathing GI: soft, nontender, nondistended, + BS MS: no deformity or atrophy Ext: no pretibial edema, pedal pulses 2+= bilaterally Skin: warm and dry, no rash Neuro:  Strength and sensation are intact Psych: euthymic  mood, full affect  EKG:  EKG is not ordered today.  Recent Labs: 11/25/2015: Magnesium 1.9 11/27/2015: Hemoglobin 12.7; Platelets 146* 12/06/2015: Brain Natriuretic Peptide 36.2; BUN 13; Creat 0.72; Potassium 4.2; Sodium 137 01/31/2016: ALT 46*   Lipid Panel     Component Value Date/Time   CHOL 140 01/31/2016 0743   TRIG 283* 01/31/2016 0743   HDL 31* 01/31/2016 0743   CHOLHDL 4.5 01/31/2016 0743   VLDL 57* 01/31/2016 0743   LDLCALC 52 01/31/2016 0743   LDLDIRECT 152.0 04/20/2015 1450      Wt Readings from Last 3 Encounters:  02/06/16 187 lb 1.9 oz (84.877 kg)  12/06/15 192 lb 12.8 oz (87.454 kg)  11/29/15 194 lb 8 oz (88.225 kg)     Cardiac Studies Reviewed: Echo 11/26/15 Distal septal and posterior lateral HK, EF 45-50%, mild MR  LHC 11/25/15 LM normal LAD proximal 80%, D1 80%, ostial first septal branch 100% LCx proximal 99% ISR RCA mid 100% with left-right collaterals SVG-RPDA 100% LIMA-LAD patent EF 45-50%, distal inferior akinesis, basal inferior hypokinesis, moderate MR PCI: 2.75 x 12 mm Promus DES to the LCx 1. Severe three-vessel coronary artery disease with severe stenosis of the proximal left circumflex, total occlusion of the proximal LAD, and total occlusion of the mid RCA. 2. Status post aortocoronary bypass surgery with continued patency of the LIMA to LAD and chronic total occlusion of the SVG to PDA 3. Extensive collaterals from the septal perforators of the LAD to the distal RCA territory 4. Moderate segmental LV systolic dysfunction consistent with the circumflex territory infarction 5. Successful PCI to left circumflex using a Promus DES     Carotid US 1/16 Bilateral - 1% to 39% ICA stenosis   ASSESSMENT AND PLAN: 1.  CAD, native vessel, with history of CABG and PCI: Continue ASA and brilinta, atorvastatin, beta-blocker, ACE-inhibitor.  2. HTN: BP well-controlled on current Rx  3. Hyperlipidemia: treated with atorvastatin 40 mg. Lipids  reviewed. Triglycerides high - counseling done. She is working on weight loss.   4. Elevated LFT's, mild: repeat labs in 2 months.   Current medicines  are reviewed with the patient today.  The patient does not have concerns regarding medicines.  Labs/ tests ordered today include:   Orders Placed This Encounter  Procedures  . Hepatic function panel    Disposition:   FU 3 months with APP, 6 months with me.   Enzo Bi, MD  02/07/2016 7:51 AM    [facility] [address] [place], [place], [place]  [address] Phone: [phone]; Fax: [phone]

## 2016-04-09 ENCOUNTER — Other Ambulatory Visit: Payer: BLUE CROSS/BLUE SHIELD

## 2016-04-21 ENCOUNTER — Emergency Department (HOSPITAL_COMMUNITY): Payer: BLUE CROSS/BLUE SHIELD

## 2016-04-21 ENCOUNTER — Observation Stay (HOSPITAL_COMMUNITY)
Admission: EM | Admit: 2016-04-21 | Discharge: 2016-04-23 | Disposition: A | Discharge status: Home / Self Care | Payer: BLUE CROSS/BLUE SHIELD | Attending: Oncology | Admitting: Oncology

## 2016-04-21 ENCOUNTER — Encounter (HOSPITAL_COMMUNITY): Payer: Self-pay | Admitting: Emergency Medicine

## 2016-04-21 DIAGNOSIS — I2510 Atherosclerotic heart disease of native coronary artery without angina pectoris: Secondary | ICD-10-CM | POA: Diagnosis not present

## 2016-04-21 DIAGNOSIS — Z720 Tobacco use: Secondary | ICD-10-CM

## 2016-04-21 DIAGNOSIS — E039 Hypothyroidism, unspecified: Secondary | ICD-10-CM | POA: Diagnosis not present

## 2016-04-21 DIAGNOSIS — Z6833 Body mass index (BMI) 33.0-33.9, adult: Secondary | ICD-10-CM | POA: Insufficient documentation

## 2016-04-21 DIAGNOSIS — R079 Chest pain, unspecified: Secondary | ICD-10-CM | POA: Diagnosis present

## 2016-04-21 DIAGNOSIS — E785 Hyperlipidemia, unspecified: Secondary | ICD-10-CM | POA: Insufficient documentation

## 2016-04-21 DIAGNOSIS — F419 Anxiety disorder, unspecified: Secondary | ICD-10-CM | POA: Diagnosis not present

## 2016-04-21 DIAGNOSIS — Z951 Presence of aortocoronary bypass graft: Secondary | ICD-10-CM | POA: Insufficient documentation

## 2016-04-21 DIAGNOSIS — I252 Old myocardial infarction: Secondary | ICD-10-CM | POA: Diagnosis not present

## 2016-04-21 DIAGNOSIS — I10 Essential (primary) hypertension: Secondary | ICD-10-CM | POA: Diagnosis not present

## 2016-04-21 DIAGNOSIS — E1140 Type 2 diabetes mellitus with diabetic neuropathy, unspecified: Secondary | ICD-10-CM | POA: Insufficient documentation

## 2016-04-21 DIAGNOSIS — K219 Gastro-esophageal reflux disease without esophagitis: Secondary | ICD-10-CM | POA: Insufficient documentation

## 2016-04-21 DIAGNOSIS — E118 Type 2 diabetes mellitus with unspecified complications: Secondary | ICD-10-CM | POA: Diagnosis present

## 2016-04-21 DIAGNOSIS — E669 Obesity, unspecified: Secondary | ICD-10-CM | POA: Insufficient documentation

## 2016-04-21 DIAGNOSIS — E1165 Type 2 diabetes mellitus with hyperglycemia: Secondary | ICD-10-CM | POA: Diagnosis not present

## 2016-04-21 DIAGNOSIS — F17210 Nicotine dependence, cigarettes, uncomplicated: Secondary | ICD-10-CM

## 2016-04-21 DIAGNOSIS — Z7982 Long term (current) use of aspirin: Secondary | ICD-10-CM | POA: Insufficient documentation

## 2016-04-21 DIAGNOSIS — J449 Chronic obstructive pulmonary disease, unspecified: Secondary | ICD-10-CM | POA: Diagnosis not present

## 2016-04-21 DIAGNOSIS — Z794 Long term (current) use of insulin: Secondary | ICD-10-CM | POA: Diagnosis not present

## 2016-04-21 DIAGNOSIS — Z79899 Other long term (current) drug therapy: Secondary | ICD-10-CM | POA: Insufficient documentation

## 2016-04-21 DIAGNOSIS — G8929 Other chronic pain: Secondary | ICD-10-CM | POA: Insufficient documentation

## 2016-04-21 DIAGNOSIS — E119 Type 2 diabetes mellitus without complications: Secondary | ICD-10-CM | POA: Diagnosis not present

## 2016-04-21 DIAGNOSIS — I249 Acute ischemic heart disease, unspecified: Secondary | ICD-10-CM | POA: Diagnosis not present

## 2016-04-21 DIAGNOSIS — R0789 Other chest pain: Secondary | ICD-10-CM | POA: Diagnosis not present

## 2016-04-21 DIAGNOSIS — E871 Hypo-osmolality and hyponatremia: Secondary | ICD-10-CM | POA: Diagnosis not present

## 2016-04-21 DIAGNOSIS — M545 Low back pain: Secondary | ICD-10-CM | POA: Diagnosis not present

## 2016-04-21 DIAGNOSIS — I25119 Atherosclerotic heart disease of native coronary artery with unspecified angina pectoris: Secondary | ICD-10-CM | POA: Diagnosis present

## 2016-04-21 DIAGNOSIS — I209 Angina pectoris, unspecified: Secondary | ICD-10-CM | POA: Diagnosis not present

## 2016-04-21 DIAGNOSIS — Z955 Presence of coronary angioplasty implant and graft: Secondary | ICD-10-CM

## 2016-04-21 LAB — CBC
HCT: 50.3 % — ABNORMAL HIGH (ref 36.0–46.0)
Hemoglobin: 17.9 g/dL — ABNORMAL HIGH (ref 12.0–15.0)
MCH: 30.6 pg (ref 26.0–34.0)
MCHC: 35.6 g/dL (ref 30.0–36.0)
MCV: 86 fL (ref 78.0–100.0)
PLATELETS: 212 10*3/uL (ref 150–400)
RBC: 5.85 MIL/uL — AB (ref 3.87–5.11)
RDW: 12.1 % (ref 11.5–15.5)
WBC: 8 10*3/uL (ref 4.0–10.5)

## 2016-04-21 LAB — BASIC METABOLIC PANEL
Anion gap: 12 (ref 5–15)
BUN: 8 mg/dL (ref 6–20)
CALCIUM: 9.7 mg/dL (ref 8.9–10.3)
CHLORIDE: 96 mmol/L — AB (ref 101–111)
CO2: 23 mmol/L (ref 22–32)
CREATININE: 0.74 mg/dL (ref 0.44–1.00)
GFR calc Af Amer: >60 mL/min (ref >60)
GFR calc non Af Amer: >60 mL/min (ref >60)
Glucose, Bld: 368 mg/dL — ABNORMAL HIGH (ref 65–99)
POTASSIUM: 3.9 mmol/L (ref 3.5–5.1)
Sodium: 131 mmol/L — ABNORMAL LOW (ref 135–145)

## 2016-04-21 LAB — PROTIME-INR
INR: 0.92
Prothrombin Time: 12.3 seconds (ref 11.4–15.2)

## 2016-04-21 LAB — I-STAT TROPONIN, ED: TROPONIN I, POC: 0 ng/mL (ref 0.00–0.08)

## 2016-04-21 LAB — GLUCOSE, CAPILLARY
GLUCOSE-CAPILLARY: 504 mg/dL — AB (ref 65–99)
GLUCOSE-CAPILLARY: 390 mg/dL — AB (ref 65–99)
Glucose-Capillary: 422 mg/dL — ABNORMAL HIGH (ref 65–99)

## 2016-04-21 LAB — TROPONIN I: Troponin I: <0.03 ng/mL (ref <0.03)

## 2016-04-21 MED ORDER — INSULIN GLARGINE 100 UNIT/ML ~~LOC~~ SOLN
30.0000 [IU] | Freq: Two times a day (BID) | SUBCUTANEOUS | Status: DC
  Filled 2016-04-21 (×2): qty 0.3

## 2016-04-21 MED ORDER — TRAZODONE HCL 50 MG PO TABS
50.0000 mg | ORAL_TABLET | Freq: Every day | ORAL | Status: DC
  Administered 2016-04-21 – 2016-04-22 (×2): 50 mg via ORAL
  Filled 2016-04-21 (×2): qty 1

## 2016-04-21 MED ORDER — REGADENOSON 0.4 MG/5ML IV SOLN
0.4000 mg | Freq: Once | INTRAVENOUS | Status: AC
  Administered 2016-04-23: 0.4 mg via INTRAVENOUS
  Filled 2016-04-21 (×3): qty 5

## 2016-04-21 MED ORDER — ENOXAPARIN SODIUM 40 MG/0.4ML ~~LOC~~ SOLN
40.0000 mg | SUBCUTANEOUS | Status: DC
  Administered 2016-04-21 – 2016-04-23 (×3): 40 mg via SUBCUTANEOUS
  Filled 2016-04-21 (×3): qty 0.4

## 2016-04-21 MED ORDER — CARVEDILOL 12.5 MG PO TABS
6.2500 mg | ORAL_TABLET | Freq: Two times a day (BID) | ORAL | Status: DC
  Administered 2016-04-21 – 2016-04-23 (×3): 6.25 mg via ORAL
  Filled 2016-04-21 (×5): qty 1

## 2016-04-21 MED ORDER — INSULIN ASPART 100 UNIT/ML ~~LOC~~ SOLN: 10.0000 [IU] | Freq: Once | SUBCUTANEOUS | Status: DC

## 2016-04-21 MED ORDER — NITROGLYCERIN 0.4 MG SL SUBL: 0.4000 mg | SUBLINGUAL_TABLET | SUBLINGUAL | Status: DC | PRN

## 2016-04-21 MED ORDER — ONDANSETRON HCL 4 MG/2ML IJ SOLN
4.0000 mg | Freq: Once | INTRAMUSCULAR | Status: AC
  Administered 2016-04-21: 4 mg via INTRAVENOUS
  Filled 2016-04-21: qty 2

## 2016-04-21 MED ORDER — INSULIN ASPART 100 UNIT/ML ~~LOC~~ SOLN
0.0000 [IU] | Freq: Every day | SUBCUTANEOUS | Status: DC
  Administered 2016-04-21: 5 [IU] via SUBCUTANEOUS
  Administered 2016-04-22: 3 [IU] via SUBCUTANEOUS

## 2016-04-21 MED ORDER — FENTANYL 25 MCG/HR TD PT72
25.0000 ug | MEDICATED_PATCH | TRANSDERMAL | Status: DC
  Administered 2016-04-21 – 2016-04-23 (×2): 25 ug via TRANSDERMAL
  Filled 2016-04-21 (×2): qty 1

## 2016-04-21 MED ORDER — INSULIN GLARGINE 100 UNIT/ML ~~LOC~~ SOLN
40.0000 [IU] | Freq: Two times a day (BID) | SUBCUTANEOUS | Status: DC
  Administered 2016-04-21 – 2016-04-22 (×2): 40 [IU] via SUBCUTANEOUS
  Filled 2016-04-21 (×4): qty 0.4

## 2016-04-21 MED ORDER — LISINOPRIL 2.5 MG PO TABS
2.5000 mg | ORAL_TABLET | Freq: Every day | ORAL | Status: DC
  Administered 2016-04-21 – 2016-04-23 (×3): 2.5 mg via ORAL
  Filled 2016-04-21 (×3): qty 1

## 2016-04-21 MED ORDER — ALBUTEROL SULFATE (2.5 MG/3ML) 0.083% IN NEBU: 2.5000 mg | INHALATION_SOLUTION | Freq: Four times a day (QID) | RESPIRATORY_TRACT | Status: DC | PRN

## 2016-04-21 MED ORDER — TIOTROPIUM BROMIDE MONOHYDRATE 18 MCG IN CAPS
18.0000 ug | ORAL_CAPSULE | Freq: Every day | RESPIRATORY_TRACT | Status: DC
  Administered 2016-04-22: 18 ug via RESPIRATORY_TRACT
  Filled 2016-04-21: qty 5

## 2016-04-21 MED ORDER — MOMETASONE FURO-FORMOTEROL FUM 100-5 MCG/ACT IN AERO
2.0000 | INHALATION_SPRAY | Freq: Two times a day (BID) | RESPIRATORY_TRACT | Status: DC
  Administered 2016-04-21 – 2016-04-23 (×4): 2 via RESPIRATORY_TRACT
  Filled 2016-04-21: qty 8.8

## 2016-04-21 MED ORDER — PROMETHAZINE HCL 25 MG PO TABS
25.0000 mg | ORAL_TABLET | Freq: Four times a day (QID) | ORAL | Status: DC | PRN
  Administered 2016-04-21 – 2016-04-23 (×4): 25 mg via ORAL
  Filled 2016-04-21 (×4): qty 1

## 2016-04-21 MED ORDER — CARVEDILOL 3.125 MG PO TABS
3.1250 mg | ORAL_TABLET | Freq: Two times a day (BID) | ORAL | Status: DC
Start: 2016-04-21 — End: 2016-04-21

## 2016-04-21 MED ORDER — INSULIN ASPART 100 UNIT/ML ~~LOC~~ SOLN
0.0000 [IU] | Freq: Three times a day (TID) | SUBCUTANEOUS | Status: DC
  Administered 2016-04-21: 9 [IU] via SUBCUTANEOUS

## 2016-04-21 MED ORDER — INSULIN ASPART 100 UNIT/ML ~~LOC~~ SOLN: 0.0000 [IU] | Freq: Every day | SUBCUTANEOUS | Status: DC

## 2016-04-21 MED ORDER — TICAGRELOR 90 MG PO TABS
90.0000 mg | ORAL_TABLET | Freq: Two times a day (BID) | ORAL | Status: DC
Start: 2016-04-21 — End: 2016-04-23
  Administered 2016-04-21 – 2016-04-23 (×5): 90 mg via ORAL
  Filled 2016-04-21 (×5): qty 1

## 2016-04-21 MED ORDER — ATORVASTATIN CALCIUM 40 MG PO TABS
40.0000 mg | ORAL_TABLET | Freq: Every day | ORAL | Status: DC
  Administered 2016-04-21 – 2016-04-23 (×3): 40 mg via ORAL
  Filled 2016-04-21 (×3): qty 1

## 2016-04-21 MED ORDER — ASPIRIN EC 81 MG PO TBEC
81.0000 mg | DELAYED_RELEASE_TABLET | Freq: Every day | ORAL | Status: DC
  Administered 2016-04-21 – 2016-04-23 (×3): 81 mg via ORAL
  Filled 2016-04-21 (×3): qty 1

## 2016-04-21 MED ORDER — SERTRALINE HCL 50 MG PO TABS
50.0000 mg | ORAL_TABLET | Freq: Every day | ORAL | Status: DC
  Administered 2016-04-21 – 2016-04-23 (×3): 50 mg via ORAL
  Filled 2016-04-21 (×3): qty 1

## 2016-04-21 MED ORDER — INSULIN ASPART 100 UNIT/ML ~~LOC~~ SOLN
0.0000 [IU] | Freq: Three times a day (TID) | SUBCUTANEOUS | Status: DC
  Administered 2016-04-21: 5 [IU] via SUBCUTANEOUS
  Administered 2016-04-22 (×2): 8 [IU] via SUBCUTANEOUS
  Administered 2016-04-22: 15 [IU] via SUBCUTANEOUS
  Administered 2016-04-23: 5 [IU] via SUBCUTANEOUS
  Administered 2016-04-23 (×2): 8 [IU] via SUBCUTANEOUS

## 2016-04-21 NOTE — H&P (Signed)
Date: 04/21/2016               Patient Name:  Sara Hobbs MRN: [id number]  DOB: [birth year] Age / Sex: 55 y.o., female   PCP: Thurman Coyer, MD         Medical Service: Internal Medicine Teaching Service         Attending Physician: Dr. Annia Belt, MD    First Contact: Welford Roche, MS4 Pager: [phone]  Second Contact: Dr. Dellia Nims  Pager: [phone]       After Hours (After 5p/  First Contact Pager: [phone]  weekends / holidays): Second Contact Pager: [phone]   Chief Complaint: Chest pain   History of Present Illness:  Sara Hobbs is a 55 yo F with history of PEA arrest in 11/2015 2/2 MI, CAD s/p CABG x2 09/2014, COPD, current smoker, and T2DM who presents with chest pain. Patient states her CP started at 2 am this morning and it was localized to the left side of her chest without radiation to arm, neck, or jaw. She was SOB, sweaty and nauseous at the time, no vomiting. Patient states she fell asleep in the couch watching a movie, woke up at 2am and walked to her kitchen to get something to drink when the chest pain started. She has nitro at home, but states she did not use it because the CP was different from CP from MI back in March. States CP continued until she arrived to the ED. CP spontaneously resolved without need for pain medication. Patient not SOB when seen, but continued to have mild nausea. Patient reports she has been feeling tired the past few days, but denies any acute symptoms/illness until this AM.    She reports 1 episode of loose stools yesterday , but has been having normal BMs otherwise. She also reports chronic sinus congestion associated with postnasal drip and sore throat. Denied headaches.   Review of Systems: Review of Systems  Constitutional: Positive for diaphoresis. Negative for chills and fever.  HENT: Positive for congestion and sore throat.   Eyes: Negative.   Respiratory: Positive for cough and sputum production.  Negative for shortness of breath and wheezing.   Cardiovascular: Negative for chest pain, palpitations and leg swelling.  Gastrointestinal: Negative for abdominal pain, diarrhea, nausea and vomiting.  Musculoskeletal:       Chest pain from CPR   Skin: Negative.   Neurological: Negative.  Negative for headaches.    PMH: Past Medical History:  Diagnosis Date  . Angina pectoris ([place])   . Anxiety   . Arthritis    "hands, neck, back, knees, hips, ankles" (11/22/2014)  . Asthma   . CAD (coronary artery disease)    a. 09/2014: LIMA to LAD, SVG to PDA, EVH via right thigh b. Cardiac Arrest 11/2015: patent LIMA-LAD, CTO if SVG-PDA, 99% stenosis of Prox-Mid Cx w/ DES placed  . CAD, multiple vessel   . Cardiac arrest ([place]) 11/25/2015  . Chest tightness   . Chronic lower back pain   . Chronic shoulder pain    "right; I've got bad rotator cuff"  . COPD (chronic obstructive pulmonary disease) ([place])   . Depression   . Diabetes mellitus with complication ([place])   . GERD (gastroesophageal reflux disease)   . Headache    "monthly" (11/22/2014)  . History of blood transfusion    "when I had my 1st youngun; when I had MI"  . History of hiatal hernia   .  History of stomach ulcers   . Hyperlipidemia   . Hypothyroidism    PMH:  . IDDM (insulin dependent diabetes mellitus) ([place])   . Incidental lung nodule, > 70m and < 835m3/21/2016   Small non-calcified nodules left lung  . Myocardial infarction ([place]   PCI w/ drug eluting stent LCx  . Neuropathy due to secondary diabetes ([place]  . Wears glasses     PSH: Past Surgical History:  Procedure Laterality Date  . ABDOMINAL HYSTERECTOMY  1990's  . APPLICATION OF WOUND VAC N/A 11/23/2014   Procedure: POSSIBLE APPLICATION OF WOUND VAC;  Surgeon: ClRexene AlbertsMD;  Location: [facility] OR;  Service: Thoracic;  Laterality: N/A;  . BREAST SURGERY Left 1990's   breast duct surgery  . CARDIAC CATHETERIZATION  09/01/14  . CARDIAC CATHETERIZATION N/A  11/25/2015   Procedure: Left Heart Cath and Cors/Grafts Angiography;  Surgeon: MiSherren MochaMD;  [facility];  Service: Cardiovascular;  Laterality: N/A;  . CARDIAC CATHETERIZATION N/A 11/25/2015   Procedure: Coronary Stent Intervention;  Surgeon: MiSherren MochaMD;  [facility];  Service: Cardiovascular;  Laterality: N/A;  . [id number]  . COLONOSCOPY    . CORONARY ANGIOPLASTY WITH STENT PLACEMENT  11/11/2013   Drug-eluting stent in LCx  . CORONARY ARTERY BYPASS GRAFT N/A 09/22/2014   Procedure: CORONARY ARTERY BYPASS GRAFTING (CABG);  Surgeon: ClRexene AlbertsMD;  [facility] Service: Open [facility] Surgery;  Laterality: N/A;  Times 2 using left internal mammary artery and endoscopically harvested right saphenous vein  . ESOPHAGOGASTRODUODENOSCOPY    . GASTRIC RESECTION  1990's   bezoar; "for reflux"  . LAPAROSCOPIC CHOLECYSTECTOMY  2000  . STERNAL WOUND DEBRIDEMENT N/A 11/23/2014   Procedure: SUPERFICIAL STERNAL WOUND DEBRIDEMENT;  Surgeon: ClRexene AlbertsMD;  [facility] Service: Thoracic;  Laterality: N/A;  . TEE WITHOUT CARDIOVERSION N/A 09/22/2014   Procedure: TRANSESOPHAGEAL ECHOCARDIOGRAM (TEE);  Surgeon: ClRexene AlbertsMD;  [place] Service: Open Heart Surgery;  Laterality: N/A;    Meds:  Prescriptions Prior to Admission  Medication Sig Dispense Refill Last Dose  . albuterol (PROVENTIL HFA;VENTOLIN HFA) 108 (90 BASE) MCG/ACT inhaler Inhale 2 puffs into the lungs every 6 (six) hours as needed for wheezing or shortness of breath.   2 years ago  . aspirin EC 81 MG tablet Take 81 mg by mouth daily.   04/20/2016 at Unknown time  . atorvastatin (LIPITOR) 40 MG tablet Take 1 tablet (40 mg total) by mouth daily at 6 PM. 90 tablet 3 04/20/2016 at Unknown time  . carvedilol (COREG) 3.125 MG tablet Take 1 tablet (3.125 mg total) by mouth 2 (two) times daily with a meal. 60 tablet 1 04/20/2016 at 1800  . fentaNYL (DURAGESIC  - DOSED MCG/HR) 25 MCG/HR patch Place 25 mcg onto the skin every other day.   04/19/2016 at am  . insulin glargine (LANTUS) 100 unit/mL SOPN Inject 50-55 Units into the skin See admin instructions. Inject 55 units subcutaneously every morning and 50 units at night   3 days ago  . insulin lispro (HUMALOG KWIKPEN) 100 UNIT/ML KiwkPen 6-8 units before supper or large meals 15 mL 1 several months ago  . lisinopril (PRINIVIL,ZESTRIL) 2.5 MG tablet Take 1 tablet (2.5 mg total) by mouth daily. 90 tablet 3 04/20/2016 at Unknown time  . nitroGLYCERIN (NITROSTAT) 0.4 MG SL tablet Place 1 tablet (0.4 mg total) under the tongue  every 5 (five) minutes as needed for chest pain. 25 tablet 04 November 2015  . promethazine (PHENERGAN) 25 MG tablet Take 25 mg by mouth every 6 (six) hours as needed for nausea or vomiting.   4 days ago  . sertraline (ZOLOFT) 50 MG tablet Take 50 mg by mouth daily.   week ago  . ticagrelor (BRILINTA) 90 MG TABS tablet Take 90 mg by mouth 2 (two) times daily.    04/20/2016 at 1800  . traZODone (DESYREL) 50 MG tablet Take 50 mg by mouth at bedtime.   week ago  . Fluticasone-Salmeterol (ADVAIR) 100-50 MCG/DOSE AEPB Inhale 1 puff into the lungs 2 (two) times daily as needed (shortness of breath).    2 years ago  . tiotropium (SPIRIVA) 18 MCG inhalation capsule Place 18 mcg into inhaler and inhale daily as needed (shortness of breath).    2 years ago    Allergies: Allergies as of 04/21/2016 - Review Complete 04/21/2016  Allergen Reaction Noted  . Wellbutrin [bupropion] Other (See Comments) and Palpitations 09/10/2014    Family History: Unknown. Patient is adopted.   Social History: Lives at home with husband and 2 dogs. Does not work. Current smoker 2ppd although states she has been smoking less for the past few days because she has not been feeling well. Last cigarrete was at 11 pm last night. Denied alcohol use. Reports marihuana use during teenage years but not currently.   Physical  Exam: Blood pressure 124/73, pulse 81, temperature 98.4 F (36.9 C), temperature source Oral, resp. rate 17, height 5' 2"  (1.575 m), weight 83.2 kg (183 lb 8 oz), SpO2 97 %.  General: appears older than stated age, sitting in bed, comfortable, in NAD  Neck: supple, no JVD noted  CV: RRR, nl S1/S2, no m/r/g, chest pain reproducible on exam, scar from CABG noted  Chest: CTAB, no wheezes or crackles, no increased work or breathing  Abd: soft, NTND, normoactive bowel sounds, no rebound or guarding  Extremities: warm and well perfused, trace LE edema bilaterally, no cyanosis  Skin: facial hair over mentum  Neuro: A&Ox4, no focal deficits   Labs/Studies:    Ref. Range 04/21/2016 05:43  WBC Latest Ref Range: 4.0 - 10.5 K/uL 8.0  RBC Latest Ref Range: 3.87 - 5.11 MIL/uL 5.85 (H)  Hemoglobin Latest Ref Range: 12.0 - 15.0 g/dL 17.9 (H)  HCT Latest Ref Range: 36.0 - 46.0 % 50.3 (H)  MCV Latest Ref Range: 78.0 - 100.0 fL 86.0  MCH Latest Ref Range: 26.0 - 34.0 pg 30.6  MCHC Latest Ref Range: 30.0 - 36.0 g/dL 35.6  RDW Latest Ref Range: 11.5 - 15.5 % 12.1  Platelets Latest Ref Range: 150 - 400 K/uL 212     Ref. Range 04/21/2016 05:43  Sodium Latest Ref Range: 135 - 145 mmol/L 131 (L)  Potassium Latest Ref Range: 3.5 - 5.1 mmol/L 3.9  Chloride Latest Ref Range: 101 - 111 mmol/L 96 (L)  CO2 Latest Ref Range: 22 - 32 mmol/L 23  BUN Latest Ref Range: 6 - 20 mg/dL 8  Creatinine Latest Ref Range: 0.44 - 1.00 mg/dL 0.74  Calcium Latest Ref Range: 8.9 - 10.3 mg/dL 9.7  EGFR (Non-African Amer.) Latest Ref Range: >60 mL/min >60  EGFR (African American) Latest Ref Range: >60 mL/min >60  Glucose Latest Ref Range: 65 - 99 mg/dL 368 (H)  Anion gap Latest Ref Range: 5 - 15  12   PT/INR 12.3/0.92 Troponin <0.03 x1   EKG: HR  94, NSR, QRS 104 possible incomplete branch block, nl QTc, no ischemic changes   CXR: CABG markers, mediastinal surgical clips and median sternotomy wires are again seen.  Cardiomediastinal silhouette size is normal. There is no pneumothorax or sizable pleural effusion. No focal airspace consolidation or pulmonary edema. Cholecystectomy clips are noted.  Assessment & Plan by Problem: Active Problems:   Chest pain  Sara Hobbs is a 55 yo F with history of PEA arrest in 11/2015 2/2 MI, CAD s/p CABG x2 09/2014, COPD, current smoker, and T2DM who presents with chest pain for 12 hrs associated with nausea and diaphoresis concerning for ACS. She is currently HDS and chest pain spontaneously resolved. Suspect this could be UA as patient presented with chest pain at rest than spontaneously resolved without EKG changes and no troponin elevation. Her chest pain could also be MSK related as patient received CPR in 11/2015 and has had chronic chest wall pain since then.Her chest also continues to be tender to palpation. Low suspicion for MI at this time given EKG without ischemic changes and troponin negative x1. Patient also reports current chest pain feels very different from previous MI. Low suspicion for GI etiology as pain started suddenly and was not related to food intake. However, will continue to trend troponin. Cardiology consulted. Per their note, likely myoview in the morning. Plan for LHC is myoview abnormal. Will make NPO at MN. Will note start heparin at this time given patient is chest pain free.   1. Chest pain: associated with nausea and diaphoresis. EKG without ischemic changes and troponin negative x1.  - Cardiology following  - Cycling troponin  - Will not start heparin at this time as patient is chest pain free  - NPO at MN for possible myoview in AM  - Plan for LHC is myoview abnormal - Nitro PRN for chest pain  - On telemetry   2. CAD s/p CABG with LIMA to LAD + SVG to PDA 09/2014: hx of PEA arrest in 11/2015. Underwent LHC with PCI to LCx. TTE with EF 45-50% and distal septal and posterolateral hypokinesis in LV.  - Per cards recommendation will  titrate carvedilol up 3.125 bid --> 6.25 bid - Continue ASA and ticagrelor  - Continue home atorva 40 and lisinopril 2.5   3. Tobacco abuse: current smoker, 2ppd  - Not ready to quit right now, contemplation phase   4. COPD:  PFT on 11/2015 FEV1 and FVC reduced to 62 with normal ratio (FEV1/FVC 79). DLCO also reduced to 68. These findings are consistent with emphysema.    - Continue home meds: spiriva, dulera, and proventil  - Hbg 17.9, likely from chronic hypoxia from COPD  - Satting well on RA   5. Chronic back pain:  - Continue fentanyl patch   6. T2DM, poorly controlled: Last A1c 11.8 on 11/2015. On Lantus 55u in AM and 50u in PM  - Lantus 30u bid + SSI  - Glucose checks   7. Anxiety:  - Continue home zoloft and trazodone   8. Hyponatremia: 2/2 hyperglycemia  - Na 131 on admission --> 135 when corrected  DVT ppx: SQ lovenox  Diet: HH diet   Dispo: Admit patient to Inpatient with expected length of stay greater than 2 midnights.  Signed: Welford Roche, Medical Student 04/21/2016, 1:08 PM  Pager: [phone]

## 2016-04-21 NOTE — ED Notes (Signed)
Cardiology at bedside.

## 2016-04-21 NOTE — ED Provider Notes (Signed)
[facility]-EMERGENCY DEPT Provider Note   CSN: [id number] Arrival date & time: 04/21/16  4098     History   Chief Complaint Chief Complaint  Patient presents with  . Chest Pain    HPI Sara Hobbs is a 55 y.o. female.  HPI 55 y.o. female with a hx of COPD, CAD, DM, HLD, presents to the Emergency Department today complaining of chest pain around 2AM. Pt states that the pain occurred when she was resting in bed. Notes sharp pain on left chest for "a while" and then subsided to a dull twinge that is intermittent. Notes pain is currently 2/10. A the time, pt was diaphoretic, short of breath, nauseated. No fevers. No ABD pain. No headaches. No syncope. Of note, she presented on 11/25/2015 with chest pain, shortness of breath, and PEA arrest in the ED. CPR initiated and underwent Cath showing three-vessel coronary artery disease with severe stenosis of the proximal left circumflex. She had patency of the LIMA to LAD graft and chronic occlusion of the vein graft to PDA. She underwent PCI of the native left circumflex without complication. No other symptoms noted.   Past Medical History:  Diagnosis Date  . Angina pectoris (HCC)   . Anxiety   . Arthritis    "hands, neck, back, knees, hips, ankles" (11/22/2014)  . Asthma   . CAD (coronary artery disease)    a. 09/2014: LIMA to LAD, SVG to PDA, EVH via right thigh b. Cardiac Arrest 11/2015: patent LIMA-LAD, CTO if SVG-PDA, 99% stenosis of Prox-Mid Cx w/ DES placed  . CAD, multiple vessel   . Cardiac arrest (HCC) 11/25/2015  . Chest tightness   . Chronic lower back pain   . Chronic shoulder pain    "right; I've got bad rotator cuff"  . COPD (chronic obstructive pulmonary disease) (HCC)   . Depression   . Diabetes mellitus with complication (HCC)   . GERD (gastroesophageal reflux disease)   . Headache    "monthly" (11/22/2014)  . History of blood transfusion    "when I had my 1st youngun; when I had MI"  . History of hiatal hernia   .  History of stomach ulcers   . Hyperlipidemia   . Hypothyroidism    PMH:  . IDDM (insulin dependent diabetes mellitus) (HCC)   . Incidental lung nodule, > 3mm and < 8mm 11/22/2014   Small non-calcified nodules left lung  . Myocardial infarction (HCC) 11/2013   PCI w/ drug eluting stent LCx  . Neuropathy due to secondary diabetes (HCC)   . Wears glasses     Patient Active Problem List   Diagnosis Date Noted  . Uncontrolled insulin dependent diabetes mellitus (HCC)   . Acute respiratory failure with hypoxia (HCC)   . Cardiac arrest (HCC) 11/25/2015  . PEA (Pulseless electrical activity) (HCC) 11/25/2015  . Respiratory failure (HCC) 11/25/2015  . Wound cellulitis 12/13/2014  . Wound infection (HCC) 11/23/2014  . Cellulitis of sternum 11/22/2014  . Incidental lung nodule, > 3mm and < 8mm 11/22/2014  . 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 Arthritis   . Back pain, chronic   . Chronic shoulder pain   . COPD (chronic obstructive pulmonary disease) (HCC)   . Diabetes mellitus with complication (HCC)   . CAD, multiple vessel   . Hyperlipidemia   . GERD (gastroesophageal reflux disease)   . Chest tightness   . Angina pectoris (HCC)   . H/O heart artery stent 11/01/2013    Past Surgical History:  Procedure Laterality Date  . ABDOMINAL HYSTERECTOMY  1990's  . APPLICATION OF WOUND VAC N/A 11/23/2014   Procedure: POSSIBLE APPLICATION OF WOUND VAC;  Surgeon: Purcell Nailslarence H Owen, MD;  Location: [facility] OR;  Service: Thoracic;  Laterality: N/A;  . BREAST SURGERY Left 1990's   breast duct surgery  . CARDIAC CATHETERIZATION  09/01/14  . CARDIAC CATHETERIZATION N/A 11/25/2015   Procedure: Left Heart Cath and Cors/Grafts Angiography;  Surgeon: Tonny BollmanMichael Cooper, MD;  [facility];  Service: Cardiovascular;  Laterality: N/A;  . CARDIAC CATHETERIZATION N/A 11/25/2015   Procedure: Coronary Stent Intervention;  Surgeon: Tonny BollmanMichael Cooper, MD;  [facility];  Service: Cardiovascular;   Laterality: N/A;  . CESAREAN SECTION  1982; 1984  . COLONOSCOPY    . CORONARY ANGIOPLASTY WITH STENT PLACEMENT  11/11/2013   Drug-eluting stent in LCx  . CORONARY ARTERY BYPASS GRAFT N/A 09/22/2014   Procedure: CORONARY ARTERY BYPASS GRAFTING (CABG);  Surgeon: Purcell Nailslarence H Owen, MD;  [facility];  Service: Open Heart Surgery;  Laterality: N/A;  Times 2 using left internal mammary artery and endoscopically harvested right saphenous vein  . ESOPHAGOGASTRODUODENOSCOPY    . GASTRIC RESECTION  1990's   bezoar; "for reflux"  . LAPAROSCOPIC CHOLECYSTECTOMY  2000  . STERNAL WOUND DEBRIDEMENT N/A 11/23/2014   Procedure: SUPERFICIAL STERNAL WOUND DEBRIDEMENT;  Surgeon: Purcell Nailslarence H Owen, MD;  Location: [facility] OR;  Service: Thoracic;  Laterality: N/A;  . TEE WITHOUT CARDIOVERSION N/A 09/22/2014   Procedure: TRANSESOPHAGEAL ECHOCARDIOGRAM (TEE);  Surgeon: Purcell Nailslarence H Owen, MD;  [facility];  Service: Open Heart Surgery;  Laterality: N/A;    OB History    No data available       Home Medications    Prior to Admission medications   Medication Sig Start Date End Date Taking? Authorizing Provider  albuterol (PROVENTIL HFA;VENTOLIN HFA) 108 (90 BASE) MCG/ACT inhaler Inhale 2 puffs into the lungs every 6 (six) hours as needed for wheezing or shortness of breath.    Historical Provider, MD  aspirin EC 81 MG tablet Take 81 mg by mouth daily.    Historical Provider, MD  atorvastatin (LIPITOR) 40 MG tablet Take 1 tablet (40 mg total) by mouth daily at 6 PM. 12/06/15   Beatrice LecherScott T Weaver, PA-C  carvedilol (COREG) 3.125 MG tablet Take 1 tablet (3.125 mg total) by mouth 2 (two) times daily with a meal. 11/26/14   Donielle Margaretann LovelessM Zimmerman, PA-C  fentaNYL (DURAGESIC - DOSED MCG/HR) 12 MCG/HR APPLY 1 PATCH TOPICALLY TO SKIN Q 72 H 10/05/15   Historical Provider, MD  fentaNYL (DURAGESIC - DOSED MCG/HR) 25 MCG/HR patch Place 25 mcg onto the skin every other day.    Historical Provider, MD  Fluticasone-Salmeterol (ADVAIR) 100-50  MCG/DOSE AEPB Inhale 1 puff into the lungs 2 (two) times daily as needed (shortness of breath).     Historical Provider, MD  insulin glargine (LANTUS) 100 UNIT/ML injection Inject 55 units into skin every morning and 40 units into skin every evening.    Historical Provider, MD  insulin lispro (HUMALOG KWIKPEN) 100 UNIT/ML KiwkPen 6-8 units before supper or large meals 04/25/15   Reather LittlerAjay Kumar, MD  lisinopril (PRINIVIL,ZESTRIL) 2.5 MG tablet Take 1 tablet (2.5 mg total) by mouth daily. 12/06/15   Beatrice LecherScott T Weaver, PA-C  nitroGLYCERIN (NITROSTAT) 0.4 MG SL tablet Place 1 tablet (0.4 mg total) under the tongue every 5 (five) minutes as needed for chest pain. 12/06/15   Beatrice LecherScott T Weaver, PA-C  sertraline (  ZOLOFT) 50 MG tablet Take 50 mg by mouth daily.    Historical Provider, MD  ticagrelor (BRILINTA) 90 MG TABS tablet Take 90 mg by mouth 2 (two) times daily.     Historical Provider, MD  tiotropium (SPIRIVA) 18 MCG inhalation capsule Place 18 mcg into inhaler and inhale daily as needed (shortness of breath).     Historical Provider, MD  traZODone (DESYREL) 50 MG tablet Take 50 mg by mouth at bedtime.    Historical Provider, MD    Family History Family History  Problem Relation Age of Onset  . Adopted: Yes  . Family history unknown: Yes    Social History Social History  Substance Use Topics  . Smoking status: Current Every Day Smoker    Packs/day: 2.00    Years: 41.00    Types: Cigarettes  . Smokeless tobacco: Former Neurosurgeon    Types: Chew    Quit date: 09/13/2005  . Alcohol use No     Allergies   Wellbutrin [bupropion]   Review of Systems Review of Systems ROS reviewed and all are negative for acute change except as noted in the HPI.  Physical Exam Updated Vital Signs BP 135/76   Pulse 87   Temp 98.7 F (37.1 C) (Oral)   Resp 16   Ht 5\' 2"  (1.575 m)   Wt 85.3 kg   SpO2 98%   BMI 34.39 kg/m   Physical Exam  Constitutional: She is oriented to person, place, and time. Vital signs are  normal. She appears well-developed and well-nourished.  HENT:  Head: Normocephalic and atraumatic.  Right Ear: Hearing normal.  Left Ear: Hearing normal.  Eyes: Conjunctivae and EOM are normal. Pupils are equal, round, and reactive to light.  Neck: Normal range of motion. Neck supple.  Cardiovascular: Normal rate, regular rhythm, normal heart sounds and intact distal pulses.   Pulmonary/Chest: Effort normal and breath sounds normal. No respiratory distress. She has no wheezes. She has no rales. She exhibits tenderness.  Chest Wall TTP on Left Anterior Upper Chest  Abdominal: Soft.  Neurological: She is alert and oriented to person, place, and time.  Skin: Skin is warm and dry.  Psychiatric: She has a normal mood and affect. Her speech is normal and behavior is normal. Thought content normal.   ED Treatments / Results  Labs (all labs ordered are listed, but only abnormal results are displayed) Labs Reviewed  BASIC METABOLIC PANEL - Abnormal; Notable for the following:       Result Value   Sodium 131 (*)    Chloride 96 (*)    Glucose, Bld 368 (*)    All other components within normal limits  CBC - Abnormal; Notable for the following:    RBC 5.85 (*)    Hemoglobin 17.9 (*)    HCT 50.3 (*)    All other components within normal limits  PROTIME-INR  I-STAT TROPOININ, ED   EKG  EKG Interpretation None       Radiology Dg Chest 2 View  Result Date: 04/21/2016 CLINICAL DATA:  Chest pain and shortness of breath EXAM: CHEST  2 VIEW COMPARISON:  Chest radiograph 11/27/2015 FINDINGS: CABG markers, mediastinal surgical clips and median sternotomy wires are again seen. Cardiomediastinal silhouette size is normal. There is no pneumothorax or sizable pleural effusion. No focal airspace consolidation or pulmonary edema. Cholecystectomy clips are noted. IMPRESSION: Clear lungs. Electronically Signed   By: Deatra Robinson M.D.   On: 04/21/2016 06:19   Cardiac Studies Reviewed: Echo  11/26/15 Distal septal and posterior lateral HK, EF 45-50%, mild MR  LHC 11/25/15 LM normal LAD proximal 80%, D1 80%, ostial first septal branch 100% LCx proximal 99% ISR RCA mid 100% with left-right collaterals SVG-RPDA 100% LIMA-LAD patent EF 45-50%, distal inferior akinesis, basal inferior hypokinesis, moderate MR PCI: 2.75 x 12 mm Promus DES to the LCx 1. Severe three-vessel coronary artery disease with severe stenosis of the proximal left circumflex, total occlusion of the proximal LAD, and total occlusion of the mid RCA. 2. Status post aortocoronary bypass surgery with continued patency of the LIMA to LAD and chronic total occlusion of the SVG to PDA 3. Extensive collaterals from the septal perforators of the LAD to the distal RCA territory 4. Moderate segmental LV systolic dysfunction consistent with the circumflex territory infarction 5. Successful PCI to left circumflex using a Promus DES  Procedures Procedures (including critical care time)  Medications Ordered in ED Medications - No data to display   Initial Impression / Assessment and Plan / ED Course  I have reviewed the triage vital signs and the nursing notes.  Pertinent labs & imaging results that were available during my care of the patient were reviewed by me and considered in my medical decision making (see chart for details).  Clinical Course     Final Clinical Impressions(s) / ED Diagnoses  I have reviewed and evaluated the relevant laboratory valuesI have reviewed and evaluated the relevant imaging studies. I have interpreted the relevant EKG.I have reviewed the relevant previous healthcare records.I have reviewed EMS Documentation.I obtained HPI from historian. Patient discussed with supervising physician  ED Course:  Assessment: Pt is a 54yF presents with CP with onset this AM at rest around 2AM. Diaphoresis. N/V. Risk Factors HTN, CAD, HLD, DM. Recent PCI due to PEA Arrest in March due to stent  occlusion. BMP with hyperglycemia. Concern for cardiac etiology of Chest Pain. Cardiology has been consulted and will see patient in the ED for likely admit to medicine. Pt does not meet criteria for CP protocol and a further evaluation is recommended. Pt has been re-evaluated prior to consult and VSS, NAD, heart RRR, pain 0/10, lungs CTAB. No acute abnormalities found on EKG and first round of cardiac enzymes negative.   Disposition/Plan:  Admit Pt acknowledges and agrees with plan  Supervising Physician Loren Racer, MD   Final diagnoses:  Chest pain, unspecified chest pain type    New Prescriptions New Prescriptions   No medications on file      Audry Pili, PA-C 04/21/16 1000    Loren Racer, MD 04/21/16 1133

## 2016-04-21 NOTE — Consult Note (Addendum)
CARDIOLOGY CONSULT NOTE     Primary Care Physician: Lavell Islam, MD Referring Physician:  Tacey Heap, [facility]  Primary Cardiologist:  Dr Excell Seltzer  Admit Date: 04/21/2016  Reason for consultation:  Chest pain  Kabao Leite is a 55 y.o. female with a h/o known CAD who now presents with chest pain.  She is s/p CABG 1/16.  She has also had multiple prior stents.  Her most recent cath 11/25/15 is reviewed.  She underwent PCI of Lc with promus DES at that time.  She did have extensive CAD. She now presents complaining of chest pain which began at 2am.  She describes this as well localized over her left chest and different from her prior angina.  She did have associated diaphoresis and nausea.  She also reports SOB.  Her symptoms have resolved.  She is currently pain free.  Today, she denies symptoms of palpitations,  orthopnea, PND, lower extremity edema, dizziness, presyncope, syncope, or neurologic sequela. The patient is tolerating medications without difficulties and is otherwise without complaint today.   Past Medical History:  Diagnosis Date  . Angina pectoris (HCC)   . Anxiety   . Arthritis    "hands, neck, back, knees, hips, ankles" (11/22/2014)  . Asthma   . CAD (coronary artery disease)    a. 09/2014: LIMA to LAD, SVG to PDA, EVH via right thigh b. Cardiac Arrest 11/2015: patent LIMA-LAD, CTO if SVG-PDA, 99% stenosis of Prox-Mid Cx w/ DES placed  . CAD, multiple vessel   . Cardiac arrest (HCC) 11/25/2015  . Chest tightness   . Chronic lower back pain   . Chronic shoulder pain    "right; I've got bad rotator cuff"  . COPD (chronic obstructive pulmonary disease) (HCC)   . Depression   . Diabetes mellitus with complication (HCC)   . GERD (gastroesophageal reflux disease)   . Headache    "monthly" (11/22/2014)  . History of blood transfusion    "when I had my 1st youngun; when I had MI"  . History of hiatal hernia   . History of stomach ulcers   . Hyperlipidemia   .  Hypothyroidism    PMH:  . IDDM (insulin dependent diabetes mellitus) (HCC)   . Incidental lung nodule, > 3mm and < 8mm 11/22/2014   Small non-calcified nodules left lung  . Myocardial infarction (HCC) 11/2013   PCI w/ drug eluting stent LCx  . Neuropathy due to secondary diabetes (HCC)   . Wears glasses    Past Surgical History:  Procedure Laterality Date  . ABDOMINAL HYSTERECTOMY  1990's  . APPLICATION OF WOUND VAC N/A 11/23/2014   Procedure: POSSIBLE APPLICATION OF WOUND VAC;  Surgeon: Purcell Nails, MD;  Location: [facility] OR;  Service: Thoracic;  Laterality: N/A;  . BREAST SURGERY Left 1990's   breast duct surgery  . CARDIAC CATHETERIZATION  09/01/14  . CARDIAC CATHETERIZATION N/A 11/25/2015   Procedure: Left Heart Cath and Cors/Grafts Angiography;  Surgeon: Tonny Bollman, MD;  Location[facility] INVASIVE CV LAB;  Service: Cardiovascular;  Laterality: N/A;  . CARDIAC CATHETERIZATION N/A 11/25/2015   Procedure: Coronary Stent Intervention;  Surgeon: Tonny Bollman, MD;  Location: [facility] CV LAB;  Service: Cardiovascular;  Laterality: N/A;  . CESAREAN SECTION  1982; 1984  . COLONOSCOPY    . CORONARY ANGIOPLASTY WITH STENT PLACEMENT  11/11/2013   Drug-eluting stent in LCx  . CORONARY ARTERY BYPASS GRAFT N/A 09/22/2014   Procedure: CORONARY ARTERY BYPASS GRAFTING (CABG);  Surgeon: Purcell Nails, MD;  Location: [facility] OR;  Service: Open Heart Surgery;  Laterality: N/A;  Times 2 using left internal mammary artery and endoscopically harvested right saphenous vein  . ESOPHAGOGASTRODUODENOSCOPY    . GASTRIC RESECTION  1990's   bezoar; "for reflux"  . LAPAROSCOPIC CHOLECYSTECTOMY  2000  . STERNAL WOUND DEBRIDEMENT N/A 11/23/2014   Procedure: SUPERFICIAL STERNAL WOUND DEBRIDEMENT;  Surgeon: Purcell Nails, MD;  Location: [facility] OR;  Service: Thoracic;  Laterality: N/A;  . TEE WITHOUT CARDIOVERSION N/A 09/22/2014   Procedure: TRANSESOPHAGEAL ECHOCARDIOGRAM (TEE);  Surgeon: Purcell Nails, MD;  Location: [facility]  OR;  Service: Open Heart Surgery;  Laterality: N/A;    . aspirin EC  81 mg Oral Daily  . atorvastatin  40 mg Oral q1800  . carvedilol  3.125 mg Oral BID WC  . enoxaparin (LOVENOX) injection  40 mg Subcutaneous Q24H  . insulin glargine  30 Units Subcutaneous See admin instructions  . lisinopril  2.5 mg Oral Daily  . mometasone-formoterol  2 puff Inhalation BID  . sertraline  50 mg Oral Daily  . ticagrelor  90 mg Oral BID  . traZODone  50 mg Oral QHS      Allergies  Allergen Reactions  . Wellbutrin [Bupropion] Other (See Comments) and Palpitations    "Makes me feel like im having a heart attack" SEVERE CHEST PAIN    Social History   Social History  . Marital status: Married    Spouse name: N/A  . Number of children: N/A  . Years of education: N/A   Occupational History  . Not on file.   Social History Main Topics  . Smoking status: Current Every Day Smoker    Packs/day: 2.00    Years: 41.00    Types: Cigarettes  . Smokeless tobacco: Former Neurosurgeon    Types: Chew    Quit date: 09/13/2005  . Alcohol use No  . Drug use: No  . Sexual activity: Yes   Other Topics Concern  . Not on file   Social History Narrative  . No narrative on file    Family History  Problem Relation Age of Onset  . Adopted: Yes  . Family history unknown: Yes    ROS- All systems are reviewed and negative except as per the HPI above  Physical Exam: Telemetry: Vitals:   04/21/16 1015 04/21/16 1030 04/21/16 1100 04/21/16 1115  BP: 134/85 133/84 108/75 123/80  Pulse: 84 77 79 83  Resp: 12 16 15 12   Temp:      TempSrc:      SpO2: 97% 97% 97% 97%  Weight:      Height:        GEN- The patient is overweight and chronically ill appearing, alert and oriented x 3 today.   Head- normocephalic, atraumatic Eyes-  Sclera clear, conjunctiva pink Ears- hearing intact Oropharynx- clear Neck- supple,   Lungs- Clear to ausculation bilaterally, normal work of breathing Heart- Regular rate and  rhythm  GI- soft, NT, ND, + BS Extremities- no clubbing, cyanosis, + edema MS- no significant deformity or atrophy Skin- no rash or lesion Psych- euthymic mood, full affect Neuro- strength and sensation are intact  EKG reveals sinus rhythm, anterior infract pattern  Labs:   Lab Results  Component Value Date   WBC 8.0 04/21/2016   HGB 17.9 (H) 04/21/2016   HCT 50.3 (H) 04/21/2016   MCV 86.0 04/21/2016   PLT 212 04/21/2016    Recent Labs Lab 04/21/16 0543  NA 131*  K  3.9  CL 96*  CO2 23  BUN 8  CREATININE 0.74  CALCIUM 9.7  GLUCOSE 368*   No results found for: CKTOTAL, CKMB, CKMBINDEX, TROPONINI Lab Results  Component Value Date   CHOL 140 01/31/2016   CHOL 104 07/21/2015   CHOL 235 (H) 04/20/2015   Lab Results  Component Value Date   HDL 31 (L) 01/31/2016   HDL 27.80 (L) 07/21/2015   HDL 30.10 (L) 04/20/2015   Lab Results  Component Value Date   LDLCALC 52 01/31/2016   LDLCALC 44 07/21/2015   Lab Results  Component Value Date   TRIG 283 (H) 01/31/2016   TRIG 337 (H) 11/25/2015   TRIG 162.0 (H) 07/21/2015   Lab Results  Component Value Date   CHOLHDL 4.5 01/31/2016   CHOLHDL 4 07/21/2015   CHOLHDL 8 04/20/2015   Lab Results  Component Value Date   LDLDIRECT 152.0 04/20/2015    Prior cath reviewed Dr Randolm Idoloopers notes reviewed    ASSESSMENT AND PLAN:   1. Chest pain/ CAD She has known extensive CAD.  Currently pain free.  No acute EKG changes.  Initial CMs negative.  I would advise admission for close monitoring and medical management.  Continue to cycle CMs.  As pain free, would not initiate heparin currently.  If rules out, would consider myoview in AM.  If CMS abnormal, will consider cath.  Please make NPO after midnight for possible myoview in am. Continue ASA, ticagrelor, statin Titrate coreg to 6.25mg  BID Smoking cessation strongly encouraged  2. Tobacco Smoking cessation heavily encouraged She is not ready to quit  3. Chronic lung  disease, chronic back pain, poorly controlled diabetes Would recommend admit to medicine service for management of multiple coexisting conditions.  Cardiology to follow closely while in the hospital   Hillis RangeJames Izek Corvino, MD 04/21/2016  11:56 AM

## 2016-04-21 NOTE — Progress Notes (Signed)
Patient lying in bed, blood sugar is 422 per 7:30 check. Per physician, give 5 units and recheck at 2100. Pt feeling a little nauseous, phenergan prev given. Will continue to monitor, call light within reach.

## 2016-04-21 NOTE — H&P (Signed)
Date: 04/21/2016               Patient Name:  Sara Hobbs MRN: [id number]  DOB: 1961-06-06 Age / Sex: 55 y.o., female   PCP: Lavell Islam, MD         Medical Service: Internal Medicine Teaching Service         Attending Physician: Dr. Levert Feinstein, MD         First Contact: Dr. Tasia Catchings Pager: [phone]       After Hours (After 5p/  First Contact Pager: [phone]  weekends / holidays): Second Contact Pager: [phone]   Chief Complaint: chest pain  History of Present Illness:   55 yo female with significant PMH including CAD s/p CABG 2016, PEA arrest, emergent PCI and stent placed on native Lcx on previous stent, uncontrolled DM II, HLD, COPD, presents with chest pain starting 2 AM today.  She felt tired throughout yesterday with poor appetite, did not eat the whole day. Was watching a movie with her husband and fell asleep during the movie. Woke up, got a drink and smoked a cigarette, then went to her bed. She then woke up around 2 AM feeling very hot and was sweating at this time (she usually does not have night sweats). She started having chest pain shortly after that, at rest, without any exertion. It was sharp and stabbing pain on the left side of her chest, different from her previous MI pain. She was very nauseous as well. She stated she was "panting" and feeling SOB.  No radiation to her arm or anywhere else. She came to the ED and her pain slowly improved without any meds. No asa or nitro was given.  Currently she has her chronic rib pain and chest wall pain from previous CPR on 11/2015. No sob. Has some nausea. Initial trop in ED negative. EKG shows no ischemic changes. Hyperglycemic at 368. Pseudohyponatremia of 131 (corrected 135). Has hgb of 17.9.   Meds:  Current Meds  Medication Sig  . albuterol (PROVENTIL HFA;VENTOLIN HFA) 108 (90 BASE) MCG/ACT inhaler Inhale 2 puffs into the lungs every 6 (six) hours as needed for wheezing or shortness of breath.  Marland Kitchen aspirin  EC 81 MG tablet Take 81 mg by mouth daily.  Marland Kitchen atorvastatin (LIPITOR) 40 MG tablet Take 1 tablet (40 mg total) by mouth daily at 6 PM.  . carvedilol (COREG) 3.125 MG tablet Take 1 tablet (3.125 mg total) by mouth 2 (two) times daily with a meal.  . fentaNYL (DURAGESIC - DOSED MCG/HR) 25 MCG/HR patch Place 25 mcg onto the skin every other day.  . insulin glargine (LANTUS) 100 unit/mL SOPN Inject 50-55 Units into the skin See admin instructions. Inject 55 units subcutaneously every morning and 50 units at night  . insulin lispro (HUMALOG KWIKPEN) 100 UNIT/ML KiwkPen 6-8 units before supper or large meals  . lisinopril (PRINIVIL,ZESTRIL) 2.5 MG tablet Take 1 tablet (2.5 mg total) by mouth daily.  . nitroGLYCERIN (NITROSTAT) 0.4 MG SL tablet Place 1 tablet (0.4 mg total) under the tongue every 5 (five) minutes as needed for chest pain.  . promethazine (PHENERGAN) 25 MG tablet Take 25 mg by mouth every 6 (six) hours as needed for nausea or vomiting.  . sertraline (ZOLOFT) 50 MG tablet Take 50 mg by mouth daily.  . ticagrelor (BRILINTA) 90 MG TABS tablet Take 90 mg by mouth 2 (two) times daily.   . traZODone (DESYREL) 50 MG tablet Take  50 mg by mouth at bedtime.     Allergies: Allergies as of 04/21/2016 - Review Complete 04/21/2016  Allergen Reaction Noted  . Wellbutrin [bupropion] Other (See Comments) and Palpitations 09/10/2014   Past Medical History:  Diagnosis Date  . Angina pectoris (HCC)   . Anxiety   . Arthritis    "hands, neck, back, knees, hips, ankles" (11/22/2014)  . Asthma   . CAD (coronary artery disease)    a. 09/2014: LIMA to LAD, SVG to PDA, EVH via right thigh b. Cardiac Arrest 11/2015: patent LIMA-LAD, CTO if SVG-PDA, 99% stenosis of Prox-Mid Cx w/ DES placed  . CAD, multiple vessel   . Cardiac arrest (HCC) 11/25/2015  . Chest tightness   . Chronic lower back pain   . Chronic shoulder pain    "right; I've got bad rotator cuff"  . COPD (chronic obstructive pulmonary  disease) (HCC)   . Depression   . Diabetes mellitus with complication (HCC)   . GERD (gastroesophageal reflux disease)   . Headache    "monthly" (11/22/2014)  . History of blood transfusion    "when I had my 1st youngun; when I had MI"  . History of hiatal hernia   . History of stomach ulcers   . Hyperlipidemia   . Hypothyroidism    PMH:  . IDDM (insulin dependent diabetes mellitus) (HCC)   . Incidental lung nodule, > 60mm and < 8mm 11/22/2014   Small non-calcified nodules left lung  . Myocardial infarction (HCC) 11/2013   PCI w/ drug eluting stent LCx  . Neuropathy due to secondary diabetes (HCC)   . Wears glasses     Family History:  Unknown as patient was adopted.  Social History:  Smokes 1 pack per day currently, but used to smoke 1.5 ppd previous. Has been smoking since she was 55 years old. ~50 PPD. Denies alcohol or any recent drug use.   Review of Systems: Review of Systems  Constitutional: Positive for diaphoresis and malaise/fatigue. Negative for chills and fever.  HENT: Negative for congestion and sore throat.   Eyes: Negative for blurred vision and double vision.  Respiratory: Positive for cough and shortness of breath.   Cardiovascular: Positive for chest pain. Negative for palpitations and leg swelling.  Gastrointestinal: Positive for nausea. Negative for abdominal pain, diarrhea, heartburn and vomiting.  Genitourinary: Negative for dysuria.  Neurological: Negative for dizziness, tremors and weakness.  Psychiatric/Behavioral: Negative for depression and substance abuse.     Physical Exam: Blood pressure 124/73, pulse 81, temperature 98.4 F (36.9 C), temperature source Oral, resp. rate 17, height 5\' 2"  (1.575 m), weight 183 lb 8 oz (83.2 kg), SpO2 97 %.  Physical Exam  Constitutional: She is oriented to person, place, and time. She appears well-developed and well-nourished. No distress.  Pleasant.   HENT:  Head: Normocephalic and atraumatic.  Mouth/Throat:  No oropharyngeal exudate.  Eyes: Conjunctivae are normal. Right eye exhibits no discharge. Left eye exhibits no discharge.  Neck: Normal range of motion.  Cardiovascular: Exam reveals no gallop and no friction rub.   No murmur heard. Respiratory: Effort normal and breath sounds normal. No respiratory distress. She has no wheezes.  Has surgical scar on chest from sternotomy. Has tenderness to palpation all over her left chest. No rashes.   GI: Soft. Bowel sounds are normal.  Musculoskeletal: She exhibits no edema or tenderness.  Neurological: She is alert and oriented to person, place, and time. No cranial nerve deficit. Coordination normal.  Skin: She is  not diaphoretic.    EKG: NSR, no ST changes, incomplete BBB.   CXR:  No effusion or infiltrates. CABG markers and surgical clips are seen.   Assessment & Plan by Problem: Active Problems:   COPD (chronic obstructive pulmonary disease) (HCC)   Diabetes mellitus with complication (HCC)   CAD, multiple vessel   GERD (gastroesophageal reflux disease)   S/P CABG x 2   Chest pain  55 yo female with significant cardiac hx presents with chest pain  Chest pain with hx of significant CAD s/p CABG 2016, and LCx stent placement 11/2015 s/p PEA arrest   unclear etiology. Her chest pain was sharp, started at rest, with nausea, diaphoresis, and resolved without any meds. No EKG changes or troponin elevation. This could be unstable angina - high risk for in-stent stenosis, vs MSK pain. She does have tenderness to palpation on her chest from previous CPR related injury. This pain is chronic for her but she had acute pain which resolved spontaneously. CXR shows normal mediastinal widening. PE Wells score and modified geneva are all low.  - will trend troponins, monitor on tele. - continued home fentanyl patch for her chronic chest wall pain - appreciate cardiology recs: no heparin for now as she is chest pain free, myoview in AM if her trops remain  negative, otherwise would consider cath. - NPO at midnight for possible stress test.  - cont asa + brilinta  - cont lipitor - increase coreg to 6.25mg  bid - cont lisinopril. Will hold if CATH is planned.   COPD with emphysema - FEV1 62%, FVC 62%, FEV1/FVC 79%, DLC0 68% On several home meds. Lung exam is clear without any wheezing or crackles. Not in acute distress - cont home COPD meds: albuterol advair (dulera here), and spiriva.   DM II - last hgba1c 11.8 on 11/2015 - cont lantus reduced dose of 30 u (50-55 units at home) + SSI  Tobacco abuse -  spent significant time to discuss tobacco cessation. She is contemplating about quitting. She mainly smokes to relief stress. We discussed focusing her energy on other things such as exercise or restart her knitting hobby. Husband wants to help out. She is willing to think about it and try it. I offered her nicotine products but she did not want any right now. Discussed the urgency to quit smoking with her hx of emphysema and CAD. - asked her to continue this discussion with her PCP.    Dispo: Admit patient to Observation with expected length of stay less than 2 midnights.  Signed: Hyacinth Meekerasrif Mell Mellott, MD 04/21/2016, 3:22 PM  Pager: [phone]

## 2016-04-21 NOTE — ED Triage Notes (Signed)
Pt. reports left chest pain with SOB , diaphoresis and nausea onset this evening , denies fever or emesis , her cardiologist is Dr. Excell Seltzer , history of CAD/CABG/Coronary stents.

## 2016-04-21 NOTE — ED Notes (Signed)
Pt. Ambulated independently to restroom at this time.  

## 2016-04-21 NOTE — ED Notes (Signed)
Attempted report 

## 2016-04-22 DIAGNOSIS — R0789 Other chest pain: Secondary | ICD-10-CM | POA: Diagnosis not present

## 2016-04-22 DIAGNOSIS — J449 Chronic obstructive pulmonary disease, unspecified: Secondary | ICD-10-CM | POA: Diagnosis not present

## 2016-04-22 DIAGNOSIS — R079 Chest pain, unspecified: Secondary | ICD-10-CM

## 2016-04-22 DIAGNOSIS — Z794 Long term (current) use of insulin: Secondary | ICD-10-CM | POA: Diagnosis not present

## 2016-04-22 DIAGNOSIS — E119 Type 2 diabetes mellitus without complications: Secondary | ICD-10-CM | POA: Diagnosis not present

## 2016-04-22 LAB — CBC
HEMATOCRIT: 47.3 % — AB (ref 36.0–46.0)
Hemoglobin: 16.2 g/dL — ABNORMAL HIGH (ref 12.0–15.0)
MCH: 30 pg (ref 26.0–34.0)
MCHC: 34.2 g/dL (ref 30.0–36.0)
MCV: 87.6 fL (ref 78.0–100.0)
Platelets: 202 10*3/uL (ref 150–400)
RBC: 5.4 MIL/uL — AB (ref 3.87–5.11)
RDW: 12.4 % (ref 11.5–15.5)
WBC: 8.2 10*3/uL (ref 4.0–10.5)

## 2016-04-22 LAB — COMPREHENSIVE METABOLIC PANEL
ALT: 36 U/L (ref 14–54)
AST: 19 U/L (ref 15–41)
Albumin: 3.8 g/dL (ref 3.5–5.0)
Alkaline Phosphatase: 129 U/L — ABNORMAL HIGH (ref 38–126)
Anion gap: 12 (ref 5–15)
BUN: 13 mg/dL (ref 6–20)
CHLORIDE: 96 mmol/L — AB (ref 101–111)
CO2: 26 mmol/L (ref 22–32)
Calcium: 9.7 mg/dL (ref 8.9–10.3)
Creatinine, Ser: 0.8 mg/dL (ref 0.44–1.00)
Glucose, Bld: 333 mg/dL — ABNORMAL HIGH (ref 65–99)
POTASSIUM: 4.7 mmol/L (ref 3.5–5.1)
SODIUM: 134 mmol/L — AB (ref 135–145)
Total Bilirubin: 0.8 mg/dL (ref 0.3–1.2)
Total Protein: 6.7 g/dL (ref 6.5–8.1)

## 2016-04-22 LAB — GLUCOSE, CAPILLARY
GLUCOSE-CAPILLARY: 360 mg/dL — AB (ref 65–99)
GLUCOSE-CAPILLARY: 287 mg/dL — AB (ref 65–99)
Glucose-Capillary: 293 mg/dL — ABNORMAL HIGH (ref 65–99)
Glucose-Capillary: 269 mg/dL — ABNORMAL HIGH (ref 65–99)

## 2016-04-22 MED ORDER — INSULIN GLARGINE 100 UNIT/ML ~~LOC~~ SOLN
40.0000 [IU] | Freq: Two times a day (BID) | SUBCUTANEOUS | Status: DC
  Filled 2016-04-22 (×2): qty 0.4

## 2016-04-22 MED ORDER — ONDANSETRON HCL 4 MG/2ML IJ SOLN
4.0000 mg | Freq: Three times a day (TID) | INTRAMUSCULAR | Status: DC | PRN
  Administered 2016-04-22 (×2): 4 mg via INTRAVENOUS
  Filled 2016-04-22 (×3): qty 2

## 2016-04-22 MED ORDER — ISOSORBIDE MONONITRATE ER 30 MG PO TB24
15.0000 mg | ORAL_TABLET | Freq: Every day | ORAL | Status: DC
  Administered 2016-04-22 – 2016-04-23 (×2): 15 mg via ORAL
  Filled 2016-04-22 (×2): qty 1

## 2016-04-22 MED ORDER — SODIUM CHLORIDE 0.9 % IV SOLN
8.0000 mg | Freq: Three times a day (TID) | INTRAVENOUS | Status: DC | PRN
  Filled 2016-04-22: qty 4

## 2016-04-22 MED ORDER — INSULIN GLARGINE 100 UNIT/ML ~~LOC~~ SOLN
20.0000 [IU] | Freq: Every day | SUBCUTANEOUS | Status: AC
  Administered 2016-04-22: 20 [IU] via SUBCUTANEOUS
  Filled 2016-04-22: qty 0.2

## 2016-04-22 NOTE — Progress Notes (Signed)
   Subjective:   Denies any chest pain currenlty other than her chronic MSK pain. No SOB or any other complaints. Had breakfast this morning inadvertently, was supposed to be NPO for stress test.   Objective:  Vital signs in last 24 hours: Vitals:   04/22/16 0619 04/22/16 0803 04/22/16 0804 04/22/16 0933  BP: 128/78   133/79  Pulse: 84   83  Resp:      Temp: 99 F (37.2 C)     TempSrc: Oral     SpO2: 99% 99% 99%   Weight:      Height:       Vitals reviewed. General: resting in bed, NAD HEENT: PERRL, EOMI, no scleral icterus Cardiac: RRR, no rubs, murmurs or gallops. Has surgical scar on chest. Has some tenderness to palpation on chest/sternum.  Pulm: clear to auscultation bilaterally, no wheezes, rales, or rhonchi Abd: soft, nontender, nondistended, BS present Ext: warm and well perfused, no pedal edema Neuro: alert and oriented X3   Assessment/Plan:  Active Problems:   COPD (chronic obstructive pulmonary disease) (HCC)   Diabetes mellitus with complication (HCC)   CAD, multiple vessel   GERD (gastroesophageal reflux disease)   S/P CABG x 2   Chest pain  55 yo F with significant cardiac hx here with chest pain.  Chest pain - unclear etiology, could be MSK. started at rest, improved spontaneously after few hours, was sharp and stabbing, with diaphoresis, nausea, and sob. Resolved spontaneously after few hours. Does have MSK pain from previous ACLS/CPR on march. Pain diff from previous MI pain.  - appreciate card recs: stress test. Hopefully we can still do it today, patient had breakfast this AM. - cont asa + brilinta - cont lipitor - cont increased coreg of 6.25mg  bid - cont lisinopril - cont fentanyl patch for chronic MSK chest pain.   COPD with emphysema -   FEV1 62%, FVC 62%, FEV1/FVC 79%, DLC0 68% On several home meds. Lung exam is clear without any wheezing or crackles. Not in acute distress - cont home COPD meds: albuterol advair (dulera here), and spiriva.    DM II - last hgba1c 11.8 on 11/2015 - cont lantus reduced dose of 40 units bid (50-55 units bid at home) + SSI  Dispo: Anticipated discharge in approximately 1-2 day(s).   Hyacinth Meeker, MD 04/22/2016, 10:37 AM Pager: [phone]

## 2016-04-22 NOTE — Progress Notes (Signed)
Primary cardiologist: Dr. Tonny BollmanMichael Cooper  Subjective:  No chest pain. Unable to do Myoview, she was brought breakfast and ate it.   Objective:  Vital Signs in the last 24 hours: Temp:  [98 F (36.7 C)-99 F (37.2 C)] 99 F (37.2 C) (08/20 0619) Pulse Rate:  [77-84] 83 (08/20 0933) Resp:  [13-20] 13 (08/19 2126) BP: (109-133)/(54-79) 133/79 (08/20 0933) SpO2:  [97 %-99 %] 99 % (08/20 0804) Weight:  [183 lb 8 oz (83.2 kg)] 183 lb 8 oz (83.2 kg) (08/19 1300)  Intake/Output from previous day:  Intake/Output Summary (Last 24 hours) at 04/22/16 1154 Last data filed at 04/21/16 1700  Gross per 24 hour  Intake              480 ml  Output                0 ml  Net              480 ml    Physical Exam: General appearance: alert, cooperative, no distress and moderately obese Neck: no carotid bruit and no JVD Lungs: clear to auscultation bilaterally Heart: regular rate and rhythm Extremities: no edema Skin: Skin color, texture, turgor normal. No rashes or lesions Neurologic: Grossly normal   Rate: 82  Rhythm: normal sinus rhythm  Lab Results:  Recent Labs  04/21/16 0543 04/22/16 0224  WBC 8.0 8.2  HGB 17.9* 16.2*  PLT 212 202    Recent Labs  04/21/16 0543 04/22/16 0224  NA 131* 134*  K 3.9 4.7  CL 96* 96*  CO2 23 26  GLUCOSE 368* 333*  BUN 8 13  CREATININE 0.74 0.80    Recent Labs  04/21/16 1627 04/21/16 2230  TROPONINI <0.03 <0.03    Recent Labs  04/21/16 0543  INR 0.92    Scheduled Meds: . aspirin EC  81 mg Oral Daily  . atorvastatin  40 mg Oral q1800  . carvedilol  6.25 mg Oral BID WC  . enoxaparin (LOVENOX) injection  40 mg Subcutaneous Q24H  . fentaNYL  25 mcg Transdermal Q48H  . insulin aspart  0-15 Units Subcutaneous TID WC  . insulin aspart  0-5 Units Subcutaneous QHS  . insulin glargine  20 Units Subcutaneous QHS  . [START ON 04/24/2016] insulin glargine  40 Units Subcutaneous BID  . isosorbide mononitrate  15 mg Oral Daily  .  lisinopril  2.5 mg Oral Daily  . mometasone-formoterol  2 puff Inhalation BID  . regadenoson  0.4 mg Intravenous Once  . sertraline  50 mg Oral Daily  . ticagrelor  90 mg Oral BID  . tiotropium  18 mcg Inhalation Daily  . traZODone  50 mg Oral QHS   Continuous Infusions:  PRN Meds:.albuterol, nitroGLYCERIN, ondansetron (ZOFRAN) IV, promethazine   Imaging: Dg Chest 2 View  Result Date: 04/21/2016 CLINICAL DATA:  Chest pain and shortness of breath EXAM: CHEST  2 VIEW COMPARISON:  Chest radiograph 11/27/2015 FINDINGS: CABG markers, mediastinal surgical clips and median sternotomy wires are again seen. Cardiomediastinal silhouette size is normal. There is no pneumothorax or sizable pleural effusion. No focal airspace consolidation or pulmonary edema. Cholecystectomy clips are noted. IMPRESSION: Clear lungs. Electronically Signed   By: Deatra RobinsonKevin  Herman M.D.   On: 04/21/2016 06:19   Cath/ PCI 11/25/15   1. Severe three-vessel coronary artery disease with severe stenosis of the proximal left circumflex, total occlusion of the proximal LAD, and total occlusion of the mid RCA.  2. Status post  aortocoronary bypass surgery with continued patency of the LIMA to LAD and chronic total occlusion of the SVG to PDA  3. Extensive collaterals from the septal perforators of the LAD to the distal RCA territory.  4. Moderate segmental LV systolic dysfunction consistent with the circumflex territory infarction  5. Successful PCI to left circumflex using a Promus DES  Assessment/Plan:  55 y.o. obese female with a h/o known CAD who now presents with chest pain.  She is s/p CABG Jan 2016.  She has also had multiple prior stents.  Her most recent cath was 11/25/15 and she underwent PCI of CFX with promus DES at that time.  She did have residual CAD with occlusion of the SVG to PDA with L-R collaterals.  Principal Problem:   Chest pain with moderate risk of acute coronary syndrome Active Problems:   COPD  (chronic obstructive pulmonary disease) (HCC)   Diabetes mellitus with complication (HCC)   CAD, multiple vessel   GERD (gastroesophageal reflux disease)   S/P CABG x 2   PLAN:  Troponin negative x 3. Will plan on Myoview in am. 1/2 dose Lantus tonight.  OK to ambulate. Add low dose Nitrate.   Corine Shelter PA-C 04/22/2016, 11:54 AM [phone]  Attending note:  Reviewed consultation note per Dr. Johney Frame and discussed case with Mr. Lenn Cal. Patient with multivessel CAD as outlined with most recent percutaneous intervention being DES to the circumflex in March of this year with otherwise prior CABG and documented graft disease. She has ruled out for ACS by cardiac enzymes. Plan is for [facility], unable to complete today as patient ate breakfast. This is being rescheduled for tomorrow. We will continue to follow with you.  Jonelle Sidle, M.D., F.A.C.C.

## 2016-04-23 ENCOUNTER — Observation Stay (HOSPITAL_BASED_OUTPATIENT_CLINIC_OR_DEPARTMENT_OTHER): Payer: BLUE CROSS/BLUE SHIELD

## 2016-04-23 ENCOUNTER — Observation Stay (HOSPITAL_COMMUNITY): Payer: BLUE CROSS/BLUE SHIELD

## 2016-04-23 DIAGNOSIS — Z794 Long term (current) use of insulin: Secondary | ICD-10-CM

## 2016-04-23 DIAGNOSIS — R0789 Other chest pain: Secondary | ICD-10-CM | POA: Diagnosis not present

## 2016-04-23 DIAGNOSIS — R079 Chest pain, unspecified: Secondary | ICD-10-CM | POA: Diagnosis not present

## 2016-04-23 DIAGNOSIS — F17200 Nicotine dependence, unspecified, uncomplicated: Secondary | ICD-10-CM

## 2016-04-23 DIAGNOSIS — J449 Chronic obstructive pulmonary disease, unspecified: Secondary | ICD-10-CM | POA: Diagnosis not present

## 2016-04-23 DIAGNOSIS — I2510 Atherosclerotic heart disease of native coronary artery without angina pectoris: Secondary | ICD-10-CM | POA: Diagnosis not present

## 2016-04-23 DIAGNOSIS — E119 Type 2 diabetes mellitus without complications: Secondary | ICD-10-CM | POA: Diagnosis not present

## 2016-04-23 DIAGNOSIS — Z79899 Other long term (current) drug therapy: Secondary | ICD-10-CM

## 2016-04-23 DIAGNOSIS — Z7982 Long term (current) use of aspirin: Secondary | ICD-10-CM

## 2016-04-23 DIAGNOSIS — Z951 Presence of aortocoronary bypass graft: Secondary | ICD-10-CM

## 2016-04-23 DIAGNOSIS — F419 Anxiety disorder, unspecified: Secondary | ICD-10-CM

## 2016-04-23 LAB — NM MYOCAR MULTI W/SPECT W/WALL MOTION / EF
Estimated workload: 1 METS
LV dias vol: 63 mL (ref 46–106)
LV sys vol: 32 mL
MPHR: 166 {beats}/min
Peak BP: 119 mmHg
Peak HR: 81 {beats}/min
Percent HR: 64 %
Percent of predicted max HR: 48 %
RATE: 0.2
Rest HR: 74 {beats}/min
SDS: 7
SRS: 4
SSS: 11
Stage 1 DBP: 71 mmHg
Stage 1 Grade: 0 %
Stage 1 HR: 76 {beats}/min
Stage 1 SBP: 119 mmHg
Stage 1 Speed: 0 mph
Stage 2 Grade: 0 %
Stage 2 HR: 76 {beats}/min
Stage 2 Speed: 0 mph
Stage 3 DBP: 71 mmHg
Stage 3 Grade: 0 %
Stage 3 HR: 89 {beats}/min
Stage 3 SBP: 129 mmHg
Stage 3 Speed: 0 mph
Stage 4 DBP: 69 mmHg
Stage 4 Grade: 0 %
Stage 4 HR: 81 {beats}/min
Stage 4 SBP: 119 mmHg
Stage 4 Speed: 0 mph
TID: 1.25

## 2016-04-23 LAB — GLUCOSE, CAPILLARY
GLUCOSE-CAPILLARY: 215 mg/dL — AB (ref 65–99)
GLUCOSE-CAPILLARY: 263 mg/dL — AB (ref 65–99)
Glucose-Capillary: 254 mg/dL — ABNORMAL HIGH (ref 65–99)

## 2016-04-23 MED ORDER — INSULIN GLARGINE 100 UNIT/ML ~~LOC~~ SOLN
50.0000 [IU] | Freq: Two times a day (BID) | SUBCUTANEOUS | Status: DC
  Filled 2016-04-23 (×3): qty 0.5

## 2016-04-23 MED ORDER — ISOSORBIDE MONONITRATE ER 30 MG PO TB24: 15.0000 mg | ORAL_TABLET | Freq: Every day | ORAL | 0 refills | Status: DC

## 2016-04-23 MED ORDER — TECHNETIUM TC 99M TETROFOSMIN IV KIT
10.0000 | PACK | Freq: Once | INTRAVENOUS | Status: AC | PRN
  Administered 2016-04-23: 10 via INTRAVENOUS

## 2016-04-23 MED ORDER — TECHNETIUM TC 99M TETROFOSMIN IV KIT
30.0000 | PACK | Freq: Once | INTRAVENOUS | Status: AC | PRN
  Administered 2016-04-23: 30 via INTRAVENOUS

## 2016-04-23 MED ORDER — CARVEDILOL 6.25 MG PO TABS: 3.1250 mg | ORAL_TABLET | Freq: Two times a day (BID) | ORAL | 2 refills | Status: DC

## 2016-04-23 MED ORDER — REGADENOSON 0.4 MG/5ML IV SOLN
INTRAVENOUS | Status: AC
  Administered 2016-04-23: 0.4 mg via INTRAVENOUS
  Filled 2016-04-23: qty 5

## 2016-04-23 NOTE — Progress Notes (Signed)
Subjective:  No acute events overnight. No new chest pain today. No SOB. Reports feeling somewhat dizzy yesterday when ambulating. No other complaints today.   Objective:  Vital signs in last 24 hours: Vitals:   04/22/16 1539 04/22/16 1947 04/22/16 2025 04/23/16 0428  BP: (!) 104/46  (!) 103/55 (!) 97/51  Pulse: 72  77 72  Resp: 18  17 18   Temp: 98.3 F (36.8 C)  98.2 F (36.8 C) 98.2 F (36.8 C)  TempSrc: Oral  Oral Oral  SpO2: 96% 96% 97% 96%  Weight:      Height:       Physical Exam: General: appears older than stated age, obese, sleeping in bed, comfortable, in NAD CV: RRR, nl S1/S2, no m/r/g, chest pain reproducible on exam (chronic), scar from CABG noted  Chest: CTAB, no wheezes or crackles, no increased work or breathing  Abd: soft, NTND, normoactive bowel sounds, no rebound or guarding  Extremities: warm and well perfused, trace LE edema bilaterally, no cyanosis  Skin: facial hair over mentum  Neuro: A&Ox4, no focal deficits   Assessment/Plan:  Principal Problem:   Chest pain with moderate risk of acute coronary syndrome Active Problems:   COPD (chronic obstructive pulmonary disease) (HCC)   Diabetes mellitus with complication (HCC)   CAD, multiple vessel   GERD (gastroesophageal reflux disease)   S/P CABG x 2  Ms. Virl DiamondLoretta Birkeland is a 55 yo F with history of PEA arrest in 11/2015 2/2 MI, CAD s/p CABG x2 09/2014, COPD, current smoker, and T2DM who presented with atypical chest pain 2 days ago. Her chest pain resolved by the time she arrived to the ED and she has been chest pain free during this admission. She is undergoing cardiac workup at this time. So far, her EKG showed no new ischemic changes or BBB and her troponin have been negative x3. Given her significant cardiac risk factors, she will have a myoview stress test today. She could potentially go home today if no abnormalities seen during stress test and if no complications during/after test. Per cards, she  will need a LHC if her stress test is abnormal.   1.Atypical chest pain: resolved. Cardiac workup nonrevealing thus far. EKG without ischemic changes and troponin neg x3.  - Myoview stress test today, will f/u  - Nitro PRN for chest pain - On telemetry  - Cardiology following   2. CAD s/p CABG with LIMA to LAD + SVG to PDA 09/2014: hx of PEA arrest in 11/2015. Underwent LHC with PCI to LCx. TTE with EF 45-50% and distal septal and posterolateral hypokinesis in LV.  - Continue carvedilol 6.25mg  bid  - Continue ASA and ticagrelor  - Continue home atorva 40 and lisinopril 2.5  3. Tobacco abuse: current smoker, 2ppd  - Not ready to quit  - Continue to encourage smoking cessation   4. COPD:  PFT on 11/2015 FEV1 and FVC reduced to 62 with normal ratio (FEV1/FVC 79). DLCO also reduced to 68. These findings are consistent with emphysema. She has mild SOB at baseline, does not require home O2.  - Continue home meds: spiriva, dulera, and proventil  - Hbg 16.2, likely from chronic hypoxia from COPD  - Satting well on RA   5. Chronic back pain:  - Continue fentanyl patch  6. T2DM, poorly controlled: Last A1c 11.8 on 11/2015. On Lantus 55u in AM and 50u in PM. Elevated glucose during this admission.  - Increase Lantus 40--> 50u bid   -  SSI  - Glucose checks   7. Anxiety:  - Continue home zoloft and trazodone   8. Hyponatremia: Na 134 yesterday --> 138 when corrected for glucose   DVT ppx: SQ lovenox Diet: NPO for stress test --> Carb modified   Dispo: Anticipated discharge in approximately 1-2 day(s).   LOS: 0 days   Burna Cash, Medical Student 04/23/2016, 7:54 AM Pager: [phone]

## 2016-04-23 NOTE — Progress Notes (Signed)
   Subjective:   Had stress test this morning without any ST changes reported, nuclear image report pending. Denies any chest pain. Was not ready to discuss her smoking cessation again today as she was irritated from our conversation yesterday. Denies any other symptoms including SOB,n/v.   Objective:  Vital signs in last 24 hours: Vitals:   04/23/16 0907 04/23/16 0916 04/23/16 0918 04/23/16 0920  BP: 119/71 123/72 129/71 119/69  Pulse: 75 (!) 103 89 81  Resp:      Temp:      TempSrc:      SpO2:      Weight:      Height:       Vitals reviewed. General: resting in bed, NAD HEENT: PERRL, EOMI, no scleral icterus Cardiac: RRR, no rubs, murmurs or gallops. Has surgical scar on chest. Has some tenderness to palpation on chest/sternum.  Pulm: clear to auscultation bilaterally, no wheezes, rales, or rhonchi Abd: soft, nontender, nondistended, BS present Ext: warm and well perfused, no pedal edema Neuro: alert and oriented X3   Assessment/Plan:  Principal Problem:   Chest pain with moderate risk of acute coronary syndrome Active Problems:   COPD (chronic obstructive pulmonary disease) (HCC)   Diabetes mellitus with complication (HCC)   CAD, multiple vessel   GERD (gastroesophageal reflux disease)   S/P CABG x 2  55 yo F with significant cardiac hx here with chest pain.  Chest pain - unclear etiology, could be MSK. started at rest, improved spontaneously after few hours, was sharp and stabbing, with diaphoresis, nausea, and sob. Resolved spontaneously after few hours. Does have MSK pain from previous ACLS/CPR on march. Pain diff from previous MI pain.  - appreciate card recs: stress test. Results pending, can go home if normal, otherwise may need cardiac Cath. - cont asa + brilinta - cont lipitor - cont increased coreg of 6.25mg  bid - cont lisinopril - cont fentanyl patch for chronic MSK chest pain.   COPD with emphysema -   FEV1 62%, FVC 62%, FEV1/FVC 79%, DLC0 68% On  several home meds. Lung exam is clear without any wheezing or crackles. Not in acute distress - cont home COPD meds: albuterol advair (dulera here), and spiriva.   DM II - last hgba1c 11.8 on 11/2015 - cont lantus reduced dose of 40 units bid (50-55 units bid at home) + SSI  Dispo: Anticipated discharge in approximately today.  Hyacinth Meeker, MD 04/23/2016, 11:39 AM Pager: [phone]

## 2016-04-23 NOTE — Progress Notes (Signed)
Patient Name: Sara Hobbs Date of Encounter: 04/23/2016     Principal Problem:   Chest pain with moderate risk of acute coronary syndrome Active Problems:   COPD (chronic obstructive pulmonary disease) (HCC)   Diabetes mellitus with complication (HCC)   CAD, multiple vessel   GERD (gastroesophageal reflux disease)   S/P CABG x 2    SUBJECTIVE  No chest pain. Seen in nuc med. Tolerated procedure well.  CURRENT MEDS . aspirin EC  81 mg Oral Daily  . atorvastatin  40 mg Oral q1800  . carvedilol  6.25 mg Oral BID WC  . enoxaparin (LOVENOX) injection  40 mg Subcutaneous Q24H  . fentaNYL  25 mcg Transdermal Q48H  . insulin aspart  0-15 Units Subcutaneous TID WC  . insulin aspart  0-5 Units Subcutaneous QHS  . insulin glargine  40 Units Subcutaneous BID  . isosorbide mononitrate  15 mg Oral Daily  . lisinopril  2.5 mg Oral Daily  . mometasone-formoterol  2 puff Inhalation BID  . sertraline  50 mg Oral Daily  . ticagrelor  90 mg Oral BID  . tiotropium  18 mcg Inhalation Daily  . traZODone  50 mg Oral QHS    OBJECTIVE  Vitals:   04/23/16 0428 04/23/16 0907 04/23/16 0916 04/23/16 0918  BP: (!) 97/51 119/71 123/72 129/71  Pulse: 72 75 (!) 103 89  Resp: 18     Temp: 98.2 F (36.8 C)     TempSrc: Oral     SpO2: 96%     Weight:      Height:        Intake/Output Summary (Last 24 hours) at 04/23/16 0919 Last data filed at 04/22/16 1852  Gross per 24 hour  Intake              480 ml  Output              400 ml  Net               80 ml   Filed Weights   04/21/16 0528 04/21/16 1300  Weight: 188 lb (85.3 kg) 183 lb 8 oz (83.2 kg)    PHYSICAL EXAM  General: Pleasant, NAD. obese Neuro: Alert and oriented X 3. Moves all extremities spontaneously. Psych: Normal affect. HEENT:  Normal  Neck: Supple without bruits or JVD. Lungs:  Resp regular and unlabored, CTA. Heart: RRR no s3, s4, or murmurs. Abdomen: Soft, non-tender, non-distended, BS + x 4.  Extremities: No  clubbing, cyanosis or edema. DP/PT/Radials 2+ and equal bilaterally.  Accessory Clinical Findings  CBC  Recent Labs  04/21/16 0543 04/22/16 0224  WBC 8.0 8.2  HGB 17.9* 16.2*  HCT 50.3* 47.3*  MCV 86.0 87.6  PLT 212 202   Basic Metabolic Panel  Recent Labs  04/21/16 0543 04/22/16 0224  NA 131* 134*  K 3.9 4.7  CL 96* 96*  CO2 23 26  GLUCOSE 368* 333*  BUN 8 13  CREATININE 0.74 0.80  CALCIUM 9.7 9.7   Liver Function Tests  Recent Labs  04/22/16 0224  AST 19  ALT 36  ALKPHOS 129*  BILITOT 0.8  PROT 6.7  ALBUMIN 3.8   No results for input(s): LIPASE, AMYLASE in the last 72 hours. Cardiac Enzymes  Recent Labs  04/21/16 1130 04/21/16 1627 04/21/16 2230  TROPONINI <0.03 <0.03 <0.03    TELE  NSR  Radiology/Studies  Dg Chest 2 View  Result Date: 04/21/2016 CLINICAL DATA:  Chest pain and  shortness of breath EXAM: CHEST  2 VIEW COMPARISON:  Chest radiograph 11/27/2015 FINDINGS: CABG markers, mediastinal surgical clips and median sternotomy wires are again seen. Cardiomediastinal silhouette size is normal. There is no pneumothorax or sizable pleural effusion. No focal airspace consolidation or pulmonary edema. Cholecystectomy clips are noted. IMPRESSION: Clear lungs. Electronically Signed   By: Deatra RobinsonKevin  Herman M.D.   On: 04/21/2016 06:19    ASSESSMENT AND PLAN Sara Hobbs is a 55 y.o. female with a h/o known CAD s/p CABG 1/16 and multiple prior stents (most recent cath 11/25/15 s/p PCI/DES to LCx), tobacco abuse, COPD and uncontrolled DM who [facility] with chest pain.  Chest pain/ CAD: she has known extensive CAD.  Currently pain free.  No acute EKG changes. She ruled out for MI. Nuclear stress test completed today. Await nuclear images.  -- Continue ASA, ticagrelor, statin and BB. Smoking cessation strongly encouraged  Tobacco Abuse: smoking cessation heavily encouraged. She is not ready to quit  Chronic lung disease: stable  Poorly  controlled diabetes: HgA1c 11.8 in 11/2015  HLD: continue stain   HTN: BP well controlled currently.    Signed, Cline CrockKathryn Thompson PA-C  Pager [phone]  Attending Note:   The patient was seen and examined.  Agree with assessment and plan as noted above.  Changes made to the above note as needed.  Patient seen and independently examined with Carlean JewsKatie Thompson, PA .   We discussed all aspects of the encounter. I agree with the assessment and plan as stated above.  I have personally reviewed the myoview images and agree with the findings of inferior MI with superimposed inf. Ischemia.  I have reviewed the angiograms from March, 2017 by Dr. Excell Seltzerooper. She has an occluded RCA with good collateral filling of the distal RCA via collaterals from the left and from the right . There was no anterior or anterior lateral ischemia.  TThis abnormal myoview would be expected knowing what we know about her last cath in March. Her symptoms were not similar to the presenting symptoms in March . Troponins are negative   I do not think she needs any further cardiac  testing at this point   I've advised her to stop smoking .   Does not sound like she wants to quit.  She also needs to get her diabetes under control    I have spent a total of 40 minutes with patient reviewing hospital  notes , telemetry, EKGs, labs and examining patient as well as establishing an assessment and plan that was discussed with the patient. > 50% of time was spent in direct patient care.  Will sign off.   Call for questions.   Vesta MixerPhilip J. Kiearra Oyervides, Montez HagemanJr., MD, [facility] 04/23/2016, 4:39 PM [address],  [address] Office [phone] Pager [phone]- [phone]

## 2016-04-23 NOTE — Discharge Summary (Signed)
Name: Sara Hobbs MRN: [id number] DOB: [birth year] 55 y.o. PCP: Lavell Islam, MD  Date of Admission: 04/21/2016  7:32 AM Date of Discharge: 04/24/2016 Attending Physician: No att. providers found Dr. Heide Spark.  Discharge Diagnosis: 1. Atypical chest pain  2. CAD  3. COPD  4. Tobacco abuse  5. T2DM  6. Anxiety    Principal Problem:   Chest pain with moderate risk of acute coronary syndrome Active Problems:   COPD (chronic obstructive pulmonary disease) (HCC)   Diabetes mellitus with complication (HCC)   CAD, multiple vessel   GERD (gastroesophageal reflux disease)   S/P CABG x 2   Discharge Medications:   Medication List    TAKE these medications   albuterol 108 (90 Base) MCG/ACT inhaler Commonly known as:  PROVENTIL HFA;VENTOLIN HFA Inhale 2 puffs into the lungs every 6 (six) hours as needed for wheezing or shortness of breath.   aspirin EC 81 MG tablet Take 81 mg by mouth daily.   atorvastatin 40 MG tablet Commonly known as:  LIPITOR Take 1 tablet (40 mg total) by mouth daily at 6 PM.   carvedilol 6.25 MG tablet Commonly known as:  COREG Take 0.5 tablets (3.125 mg total) by mouth 2 (two) times daily with a meal. What changed:  medication strength   fentaNYL 25 MCG/HR patch Commonly known as:  DURAGESIC - dosed mcg/hr Place 25 mcg onto the skin every other day.   Fluticasone-Salmeterol 100-50 MCG/DOSE Aepb Commonly known as:  ADVAIR Inhale 1 puff into the lungs 2 (two) times daily as needed (shortness of breath).   insulin glargine 100 unit/mL Sopn Commonly known as:  LANTUS Inject 50-55 Units into the skin See admin instructions. Inject 55 units subcutaneously every morning and 50 units at night   insulin lispro 100 UNIT/ML KiwkPen Commonly known as:  HUMALOG KWIKPEN 6-8 units before supper or large meals   isosorbide mononitrate 30 MG 24 hr tablet Commonly known as:  IMDUR Take 0.5 tablets (15 mg total) by mouth daily.   lisinopril 2.5 MG  tablet Commonly known as:  PRINIVIL,ZESTRIL Take 1 tablet (2.5 mg total) by mouth daily.   nitroGLYCERIN 0.4 MG SL tablet Commonly known as:  NITROSTAT Place 1 tablet (0.4 mg total) under the tongue every 5 (five) minutes as needed for chest pain.   promethazine 25 MG tablet Commonly known as:  PHENERGAN Take 25 mg by mouth every 6 (six) hours as needed for nausea or vomiting.   sertraline 50 MG tablet Commonly known as:  ZOLOFT Take 50 mg by mouth daily.   ticagrelor 90 MG Tabs tablet Commonly known as:  BRILINTA Take 90 mg by mouth 2 (two) times daily.   tiotropium 18 MCG inhalation capsule Commonly known as:  SPIRIVA Place 18 mcg into inhaler and inhale daily as needed (shortness of breath).   traZODone 50 MG tablet Commonly known as:  DESYREL Take 50 mg by mouth at bedtime.       Disposition and follow-up:   SaraSara Hobbs was discharged from [facility] in Stable condition.  At the hospital follow up visit please address:  1.  Please assess any ongoing chest pain. Please assess motivation for smoking cessation.   2.  Labs / imaging needed at time of follow-up: None   3.  Pending labs/ test needing follow-up: None   Follow-up Appointments:   Hospital Course by problem list: Principal Problem:   Chest pain with moderate risk of acute coronary syndrome Active Problems:  COPD (chronic obstructive pulmonary disease) (HCC)   Diabetes mellitus with complication (HCC)   CAD, multiple vessel   GERD (gastroesophageal reflux disease)   S/P CABG x 2   Sara Hobbs is a 55 yo F with history of PEA arrest in 11/2015 2/2 MI, CAD s/p CABG x2 09/2014, COPD, current smoker, and T2DM who presented with atypical chest pain concerning for ACS given her significant risk factors.   1. Atypical chest pain: Patient presented with acute onset of L-sided chest pain associated with nausea and diaphoresis. Patient described the pain as sharp and different from  the pain in 11/2015 when she PEA arrested after an MI. Chest pain had resolved spontaneously by the time she arrived to the ED. She remained chest pain free during this admission. Her EKG showed no acute ischemic changes and her CM were negative x3. Stress test on 8/21 showed findings consistent with prior MI, but no ST segment deviation. The etiology of her chest pain remains unclear at this time, but low suspicion for ACS given negative cardiac workup. She was chest pain free on day of discharge.   2. CAD: s/p CABG x2 1/ 2016. Patient has a history of PEA arrest in 11/2015 with PCI to Lcx. TTE at the time showed EF 45-50%. Per cardiology recommendation, her carvedilol was titrated up 3.125--> 6.25mg  bid. Her home ASA, ticagrelor, atorvastatin, and lisinopril were continued.   3. COPD: Her home medications were continued. Smoking cessation encouraged as described below.   4. Tobacco abuse: Patient currently smokes 2ppd. Smoking cessation heavily encouraged during this admission, however, patient not interested in quitting at this time.   5. T2DM, poorly controlled: Patient on reduced Lantus dose and SSI. Lantus increased to 50u bid on day of discharge.   6. Anxiety: Home medications were continued.    Discharge Vitals:   BP (!) 96/48 (BP Location: Left Arm)   Pulse 75   Temp 98.1 F (36.7 C) (Oral)   Resp 20   Ht 5\' 2"  (1.575 m)   Wt 83.2 kg (183 lb 8 oz)   SpO2 96%   BMI 33.56 kg/m   Pertinent Labs, Studies, and Procedures:   1. Myoview stress test 04/23/2016: There was no ST segment deviation noted during stress. Findings consistent with ischemia and prior myocardial infarction. This is an intermediate risk study. The left ventricular ejection fraction is mildly decreased (45-54%). Moderate sized inferior wall infarct from apex to base with moderate periinfarct ischemia EF 49% with inferobasal hypokinesis.    Discharge Instructions: Discharge Instructions    Diet - low sodium heart  healthy    Complete by:  As directed   Discharge instructions    Complete by:  As directed   Please take all of your medications. You need to quit smoking, otherwise you will continue to have problem with your heart and lung.  Please follow up with your regular doctor in 1 week.   Increase activity slowly    Complete by:  As directed      Signed:  Hyacinth Meekerasrif Jaimey Franchini, Pgy3  And Randa Lynnaly Santos MS IV

## 2016-04-27 ENCOUNTER — Encounter: Payer: Self-pay | Admitting: Physician Assistant

## 2016-05-13 NOTE — Progress Notes (Signed)
Cardiology Office Note:    Date:  05/14/2016   ID:  Sara Hobbs, DOB 1960/12/27, MRN [id number]  PCP:  Lavell Islam, MD  Cardiologist:  Dr. Tonny Bollman   Electrophysiologist:  n/a  Referring MD: Andi Devon Laban Emperor, MD   Chief Complaint  Patient presents with  . Follow-up    CAD  . Hospitalization Follow-up    admx with chest pain   History of Present Illness:    Sara Hobbs is a 55 y.o. female with a hx of CAD s/p CABG, COPD, diabetes, HTN, HL, chronic pain. Previously followed in [facility].  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     Admitted in 3/17 with chest pain and while in triage she became unresponsive and developed PEA arrest.  LHC demonstrated 3 vessel CAD with severe stenosis of the proximal LCx, patent LIMA-LAD and chronic occlusion of the SVG-PDA. She had PCI with DES to native LCx.   Last seen by Dr. Excell Seltzer 6/17. She returns for follow-up. She was admitted in 8/17 with chest pain. Cardiac enzymes remained normal. She underwent inpatient Myoview. This demonstrated moderate sized inferior wall infarct from apex to the base with moderate peri-infarct ischemia, EF 49%. This was reviewed by Dr. Elease Hashimoto. He reviewed her previous angiograms. The patient was noted to have an occluded RCA and S-RCA with good collaterals from L and R to the distal RCA. Her stress nuclear study did not demonstrate anterior or anterolateral ischemia. Findings on her stress test were felt to be similar to her known anatomy. No further cardiac testing was warranted.  She returns for follow-up.  She is here with her husband.  She continues to have chest pains that are brief and only last seconds.  She denies exertional chest pain.  She is sedentary and does not work.  She continues to smoke. She denies any recurrent anginal symptoms like she had prior to her CABG or with her MI. She denies significant dyspnea.  She denies  orthopnea, edema or syncope.  She does have a chronic cough.  She and her husband have a lot of questions about her hospital stay.  She tells me that someone came by her room and told her that they were going to "open her up" and "look at my arteries."  She also notes the they wanted to know how her body "made new arteries."    Prior CV studies that were reviewed today include:    Nuclear Stress Test 04/23/16 Moderate sized inferior wall infarct from apex to base with moderate peri infarct ischemia EF 49% with inferobasal hypokinesis  Echo 11/26/15 Distal septal and posterior lateral HK, EF 45-50%, mild MR   LHC 11/25/15 LM normal LAD proximal 80%, D1 80%, ostial first septal branch 100% LCx proximal 99% ISR RCA mid 100% with left-right collaterals SVG-RPDA 100% LIMA-LAD patent EF 45-50%, distal inferior akinesis, basal inferior hypokinesis, moderate MR PCI: 2.75 x 12 mm Promus DES to the LCx 1. Severe three-vessel coronary artery disease with severe stenosis of the proximal left circumflex, total occlusion of the proximal LAD, and total occlusion of the mid RCA. 2. Status post aortocoronary bypass surgery with continued patency of the LIMA to LAD and chronic total occlusion of the SVG to PDA 3. Extensive collaterals from the septal perforators of the LAD to the distal RCA territory 4. Moderate segmental LV systolic dysfunction consistent with the circumflex territory infarction 5. Successful  PCI to left circumflex using a Promus DES  Carotid US 1/16 Bilateral - 1% to 39% ICA stenosis   Past Medical History:  Diagnosis Date  . Angina pectoris (HCC)   . Anxiety   . Arthritis    "hands, neck, back, knees, hips, ankles" (11/22/2014)  . Asthma   . CAD (coronary artery disease)    a. 09/2014: LIMA to LAD, SVG to PDA, EVH via right thigh b. Cardiac Arrest 11/2015: patent LIMA-LAD, CTO if SVG-PDA, 99% stenosis of Prox-Mid Cx w/ DES placed  . CAD, multiple vessel   . Cardiac arrest (HCC)  11/25/2015  . Chest tightness   . Chronic lower back pain   . Chronic shoulder pain    "right; I've got bad rotator cuff"  . COPD (chronic obstructive pulmonary disease) (HCC)   . Depression   . Diabetes mellitus with complication (HCC)   . GERD (gastroesophageal reflux disease)   . Headache    "monthly" (11/22/2014)  . History of blood transfusion    "when I had my 1st youngun; when I had MI"  . History of hiatal hernia   . History of stomach ulcers   . Hyperlipidemia   . Hypothyroidism    PMH:  . IDDM (insulin dependent diabetes mellitus) (HCC)   . Incidental lung nodule, > 3mm and < 8mm 11/22/2014   Small non-calcified nodules left lung  . Myocardial infarction (HCC) 11/2013   PCI w/ drug eluting stent LCx  . Neuropathy due to secondary diabetes (HCC)   . Wears glasses     Past Surgical History:  Procedure Laterality Date  . ABDOMINAL HYSTERECTOMY  1990's  . APPLICATION OF WOUND VAC N/A 11/23/2014   Procedure: POSSIBLE APPLICATION OF WOUND VAC;  Surgeon: Purcell Nailslarence H Owen, MD;  Location: [facility] OR;  Service: Thoracic;  Laterality: N/A;  . BREAST SURGERY Left 1990's   breast duct surgery  . CARDIAC CATHETERIZATION  09/01/14  . CARDIAC CATHETERIZATION N/A 11/25/2015   Procedure: Left Heart Cath and Cors/Grafts Angiography;  Surgeon: Tonny BollmanMichael Cooper, MD;  [facility];  Service: Cardiovascular;  Laterality: N/A;  . CARDIAC CATHETERIZATION N/A 11/25/2015   Procedure: Coronary Stent Intervention;  Surgeon: Tonny BollmanMichael Cooper, MD;  [facility];  Service: Cardiovascular;  Laterality: N/A;  . CESAREAN SECTION  1982; 1984  . COLONOSCOPY    . CORONARY ANGIOPLASTY WITH STENT PLACEMENT  11/11/2013   Drug-eluting stent in LCx  . CORONARY ARTERY BYPASS GRAFT N/A 09/22/2014   Procedure: CORONARY ARTERY BYPASS GRAFTING (CABG);  Surgeon: Purcell Nailslarence H Owen, MD;  [facility];  Service: Open Heart Surgery;  Laterality: N/A;  Times 2 using left internal mammary artery and  endoscopically harvested right saphenous vein  . ESOPHAGOGASTRODUODENOSCOPY    . GASTRIC RESECTION  1990's   bezoar; "for reflux"  . LAPAROSCOPIC CHOLECYSTECTOMY  2000  . STERNAL WOUND DEBRIDEMENT N/A 11/23/2014   Procedure: SUPERFICIAL STERNAL WOUND DEBRIDEMENT;  Surgeon: Purcell Nailslarence H Owen, MD;  Location: [facility] OR;  Service: Thoracic;  Laterality: N/A;  . TEE WITHOUT CARDIOVERSION N/A 09/22/2014   Procedure: TRANSESOPHAGEAL ECHOCARDIOGRAM (TEE);  Surgeon: Purcell Nailslarence H Owen, MD;  [facility];  Service: Open Heart Surgery;  Laterality: N/A;    Current Medications: Outpatient Medications Prior to Visit  Medication Sig Dispense Refill  . albuterol (PROVENTIL HFA;VENTOLIN HFA) 108 (90 BASE) MCG/ACT inhaler Inhale 2 puffs into the lungs every 6 (six) hours as needed for wheezing or shortness of breath.    Marland Kitchen. aspirin  EC 81 MG tablet Take 81 mg by mouth daily.    Marland Kitchen atorvastatin (LIPITOR) 40 MG tablet Take 1 tablet (40 mg total) by mouth daily at 6 PM. 90 tablet 3  . fentaNYL (DURAGESIC - DOSED MCG/HR) 25 MCG/HR patch Place 25 mcg onto the skin every other day.    . Fluticasone-Salmeterol (ADVAIR) 100-50 MCG/DOSE AEPB Inhale 1 puff into the lungs 2 (two) times daily as needed (shortness of breath).     . insulin glargine (LANTUS) 100 unit/mL SOPN Inject 50-55 Units into the skin See admin instructions. Inject 55 units subcutaneously every morning and 50 units at night    . lisinopril (PRINIVIL,ZESTRIL) 2.5 MG tablet Take 1 tablet (2.5 mg total) by mouth daily. 90 tablet 3  . nitroGLYCERIN (NITROSTAT) 0.4 MG SL tablet Place 1 tablet (0.4 mg total) under the tongue every 5 (five) minutes as needed for chest pain. 25 tablet 3  . promethazine (PHENERGAN) 25 MG tablet Take 25 mg by mouth every 6 (six) hours as needed for nausea or vomiting.    . sertraline (ZOLOFT) 50 MG tablet Take 50 mg by mouth daily.    Marland Kitchen tiotropium (SPIRIVA) 18 MCG inhalation capsule Place 18 mcg into inhaler and inhale daily as needed  (shortness of breath).     . traZODone (DESYREL) 50 MG tablet Take 50 mg by mouth at bedtime.    . isosorbide mononitrate (IMDUR) 30 MG 24 hr tablet Take 0.5 tablets (15 mg total) by mouth daily. 30 tablet 0  . ticagrelor (BRILINTA) 90 MG TABS tablet Take 90 mg by mouth 2 (two) times daily.     . carvedilol (COREG) 6.25 MG tablet Take 0.5 tablets (3.125 mg total) by mouth 2 (two) times daily with a meal. 60 tablet 2  . insulin lispro (HUMALOG KWIKPEN) 100 UNIT/ML KiwkPen 6-8 units before supper or large meals 15 mL 1   No facility-administered medications prior to visit.       Allergies:   Wellbutrin [bupropion]   Social History   Social History  . Marital status: Married    Spouse name: N/A  . Number of children: N/A  . Years of education: N/A   Social History Main Topics  . Smoking status: Current Every Day Smoker    Packs/day: 2.00    Years: 41.00    Types: Cigarettes  . Smokeless tobacco: Former Neurosurgeon    Types: Chew    Quit date: 09/13/2005  . Alcohol use No  . Drug use: No  . Sexual activity: Yes   Other Topics Concern  . None   Social History Narrative  . None     Family History:  The patient's She was adopted. Family history is unknown by patient.   ROS:   Please see the history of present illness.    Review of Systems  HENT: Positive for headaches.   Cardiovascular: Positive for chest pain.  Respiratory: Positive for cough.   Gastrointestinal: Positive for nausea.  Neurological: Positive for dizziness.  Psychiatric/Behavioral: Positive for depression. The patient is nervous/anxious.    All other systems reviewed and are negative.   EKGs/Labs/Other Test Reviewed:    EKG:  EKG is  ordered today.  The ekg ordered today demonstrates NSR, HR 81, normal axis, inferior Q waves, IVCD, NSSTTW changes  Recent Labs: 11/25/2015: Magnesium 1.9 12/06/2015: Brain Natriuretic Peptide 36.2 04/22/2016: ALT 36; BUN 13; Creatinine, Ser 0.80; Hemoglobin 16.2; Platelets 202;  Potassium 4.7; Sodium 134   Recent Lipid Panel  Component Value Date/Time   CHOL 140 01/31/2016 0743   TRIG 283 (H) 01/31/2016 0743   HDL 31 (L) 01/31/2016 0743   CHOLHDL 4.5 01/31/2016 0743   VLDL 57 (H) 01/31/2016 0743   LDLCALC 52 01/31/2016 0743   LDLDIRECT 152.0 04/20/2015 1450     Physical Exam:    VS:  BP 110/68   Pulse 81   Ht 5\' 2"  (1.575 m)   Wt 189 lb 6.4 oz (85.9 kg)   BMI 34.64 kg/m     Wt Readings from Last 3 Encounters:  05/14/16 189 lb 6.4 oz (85.9 kg)  04/21/16 183 lb 8 oz (83.2 kg)  02/06/16 187 lb 1.9 oz (84.9 kg)     Physical Exam  Constitutional: She is oriented to person, place, and time. She appears well-developed and well-nourished. No distress.  HENT:  Head: Normocephalic.  Eyes: No scleral icterus.  Neck: No JVD present.  Cardiovascular: Normal rate, regular rhythm and normal heart sounds.   No murmur heard. Pulmonary/Chest: Effort normal. She has no wheezes. She has no rales.  There is mild dehiscence of sternum noted with tenderness to palpation but no grinding sensation or instability noted   Abdominal: Soft. There is no tenderness.  Musculoskeletal: She exhibits no edema.  Neurological: She is alert and oriented to person, place, and time.  Skin: Skin is warm and dry.  Psychiatric: She has a normal mood and affect.    ASSESSMENT:    1. Other chest pain   2. Coronary artery disease involving native coronary artery of native heart without angina pectoris   3. Ischemic cardiomyopathy   4. Essential hypertension   5. Hyperlipidemia   6. Tobacco abuse    PLAN:    In order of problems listed above:  1. Chest pain - Atypical.  MSK type chest pain.  This may be related to her known sternal dehiscence.  I am sure that CPR in 3/17 only made this worse.  At this point, I have offered referral back to Dr. Cornelius Moras for his opinion. I do not think that rewiring would be recommended at this point.  She prefers to hold off for now.  I have  recommended prn Ibuprofen and heat to her chest 1-2 times a day.  2. CAD - s/p CABG.  She had ACS with PEA arrest in 3/17 with LHC demonstrating severe CAD with patent L-LAD, occluded LAD and RCA and an occluded S-PDA.  There were extensive L-R collaterals.  She has sig stenosis in the native LCx which was treated with DES.  Continue ASA, Brilinta, statin, beta-blocker, ACE inhibitor, nitrates.                   3. Ischemic CM - EF 45-50% by echo post MI.  Continue beta-blocker, ACE inhibitor.     4. HTN - Controlled.    5. HL - LDL in 5/17 was 52.  Continue statin.    6. Tobacco abuse - I have recommended that she quit.   Medication Adjustments/Labs and Tests Ordered: Current medicines are reviewed at length with the patient today.  Concerns regarding medicines are outlined above.  Medication changes, Labs and Tests ordered today are outlined in the Patient Instructions noted below. Patient Instructions  Medication Instructions:  A REFILL FOR BRILINTA HAS BEEN SENT IN TODAY Labwork: NONE Testing/Procedures: NONE Follow-Up: 08/23/16 @ 3:45 WITH DR. Excell Seltzer Any Other Special Instructions Will Be Listed Below (If Applicable). If you need a refill on your cardiac  medications before your next appointment, please call your pharmacy.  Signed, Tereso Newcomer, PA-C  05/14/2016 1:44 PM    [facility] [address] [place], [place], [place]  [address] Phone: [phone]; Fax: [phone]

## 2016-05-14 ENCOUNTER — Encounter: Payer: Self-pay | Admitting: Physician Assistant

## 2016-05-14 ENCOUNTER — Ambulatory Visit (INDEPENDENT_AMBULATORY_CARE_PROVIDER_SITE_OTHER): Payer: BLUE CROSS/BLUE SHIELD | Admitting: Physician Assistant

## 2016-05-14 ENCOUNTER — Ambulatory Visit: Payer: BLUE CROSS/BLUE SHIELD | Admitting: Physician Assistant

## 2016-05-14 VITALS — BP 110/68 | HR 81 | Ht 62.0 in | Wt 189.4 lb

## 2016-05-14 DIAGNOSIS — Z720 Tobacco use: Secondary | ICD-10-CM

## 2016-05-14 DIAGNOSIS — I2510 Atherosclerotic heart disease of native coronary artery without angina pectoris: Secondary | ICD-10-CM

## 2016-05-14 DIAGNOSIS — R0789 Other chest pain: Secondary | ICD-10-CM | POA: Diagnosis not present

## 2016-05-14 DIAGNOSIS — I10 Essential (primary) hypertension: Secondary | ICD-10-CM | POA: Diagnosis not present

## 2016-05-14 DIAGNOSIS — I255 Ischemic cardiomyopathy: Secondary | ICD-10-CM | POA: Diagnosis not present

## 2016-05-14 DIAGNOSIS — E785 Hyperlipidemia, unspecified: Secondary | ICD-10-CM

## 2016-05-14 MED ORDER — TICAGRELOR 90 MG PO TABS: 90.0000 mg | ORAL_TABLET | Freq: Two times a day (BID) | ORAL | 3 refills | Status: DC

## 2016-05-14 NOTE — Patient Instructions (Addendum)
Medication Instructions:  A REFILL FOR BRILINTA HAS BEEN SENT IN TODAY Labwork: NONE Testing/Procedures: NONE Follow-Up: 08/23/16 @ 3:45 WITH DR. Excell Seltzer Any Other Special Instructions Will Be Listed Below (If Applicable). If you need a refill on your cardiac medications before your next appointment, please call your pharmacy.

## 2016-06-17 ENCOUNTER — Emergency Department (HOSPITAL_COMMUNITY): Payer: BLUE CROSS/BLUE SHIELD

## 2016-06-17 ENCOUNTER — Observation Stay (HOSPITAL_COMMUNITY)
Admission: EM | Admit: 2016-06-17 | Discharge: 2016-06-20 | Disposition: A | Discharge status: Home / Self Care | Payer: BLUE CROSS/BLUE SHIELD | Attending: Family Medicine | Admitting: Family Medicine

## 2016-06-17 ENCOUNTER — Encounter (HOSPITAL_COMMUNITY): Payer: Self-pay | Admitting: Emergency Medicine

## 2016-06-17 DIAGNOSIS — I2510 Atherosclerotic heart disease of native coronary artery without angina pectoris: Secondary | ICD-10-CM | POA: Insufficient documentation

## 2016-06-17 DIAGNOSIS — I209 Angina pectoris, unspecified: Secondary | ICD-10-CM | POA: Diagnosis present

## 2016-06-17 DIAGNOSIS — Z7982 Long term (current) use of aspirin: Secondary | ICD-10-CM | POA: Diagnosis not present

## 2016-06-17 DIAGNOSIS — J449 Chronic obstructive pulmonary disease, unspecified: Secondary | ICD-10-CM | POA: Insufficient documentation

## 2016-06-17 DIAGNOSIS — E781 Pure hyperglyceridemia: Secondary | ICD-10-CM | POA: Insufficient documentation

## 2016-06-17 DIAGNOSIS — I110 Hypertensive heart disease with heart failure: Secondary | ICD-10-CM | POA: Insufficient documentation

## 2016-06-17 DIAGNOSIS — F329 Major depressive disorder, single episode, unspecified: Secondary | ICD-10-CM | POA: Diagnosis not present

## 2016-06-17 DIAGNOSIS — R079 Chest pain, unspecified: Secondary | ICD-10-CM | POA: Diagnosis present

## 2016-06-17 DIAGNOSIS — E1140 Type 2 diabetes mellitus with diabetic neuropathy, unspecified: Secondary | ICD-10-CM | POA: Insufficient documentation

## 2016-06-17 DIAGNOSIS — M549 Dorsalgia, unspecified: Secondary | ICD-10-CM

## 2016-06-17 DIAGNOSIS — I25119 Atherosclerotic heart disease of native coronary artery with unspecified angina pectoris: Secondary | ICD-10-CM | POA: Diagnosis present

## 2016-06-17 DIAGNOSIS — Z951 Presence of aortocoronary bypass graft: Secondary | ICD-10-CM

## 2016-06-17 DIAGNOSIS — F17210 Nicotine dependence, cigarettes, uncomplicated: Secondary | ICD-10-CM | POA: Insufficient documentation

## 2016-06-17 DIAGNOSIS — I5020 Unspecified systolic (congestive) heart failure: Secondary | ICD-10-CM | POA: Diagnosis not present

## 2016-06-17 DIAGNOSIS — E785 Hyperlipidemia, unspecified: Secondary | ICD-10-CM | POA: Diagnosis not present

## 2016-06-17 DIAGNOSIS — R0602 Shortness of breath: Secondary | ICD-10-CM | POA: Diagnosis not present

## 2016-06-17 DIAGNOSIS — I252 Old myocardial infarction: Secondary | ICD-10-CM | POA: Insufficient documentation

## 2016-06-17 DIAGNOSIS — I2582 Chronic total occlusion of coronary artery: Secondary | ICD-10-CM | POA: Insufficient documentation

## 2016-06-17 DIAGNOSIS — G8929 Other chronic pain: Secondary | ICD-10-CM | POA: Diagnosis present

## 2016-06-17 DIAGNOSIS — Z794 Long term (current) use of insulin: Secondary | ICD-10-CM | POA: Insufficient documentation

## 2016-06-17 DIAGNOSIS — Z8674 Personal history of sudden cardiac arrest: Secondary | ICD-10-CM | POA: Insufficient documentation

## 2016-06-17 DIAGNOSIS — Z7902 Long term (current) use of antithrombotics/antiplatelets: Secondary | ICD-10-CM | POA: Diagnosis not present

## 2016-06-17 DIAGNOSIS — I255 Ischemic cardiomyopathy: Secondary | ICD-10-CM | POA: Insufficient documentation

## 2016-06-17 DIAGNOSIS — Z955 Presence of coronary angioplasty implant and graft: Secondary | ICD-10-CM

## 2016-06-17 DIAGNOSIS — K219 Gastro-esophageal reflux disease without esophagitis: Secondary | ICD-10-CM | POA: Diagnosis present

## 2016-06-17 DIAGNOSIS — Z7951 Long term (current) use of inhaled steroids: Secondary | ICD-10-CM | POA: Insufficient documentation

## 2016-06-17 DIAGNOSIS — E1165 Type 2 diabetes mellitus with hyperglycemia: Secondary | ICD-10-CM | POA: Insufficient documentation

## 2016-06-17 DIAGNOSIS — Z79899 Other long term (current) drug therapy: Secondary | ICD-10-CM | POA: Insufficient documentation

## 2016-06-17 DIAGNOSIS — E118 Type 2 diabetes mellitus with unspecified complications: Secondary | ICD-10-CM | POA: Diagnosis present

## 2016-06-17 DIAGNOSIS — IMO0001 Reserved for inherently not codable concepts without codable children: Secondary | ICD-10-CM

## 2016-06-17 HISTORY — DX: Heart failure, unspecified: I50.9

## 2016-06-17 LAB — CBC
HEMATOCRIT: 42.5 % (ref 36.0–46.0)
HEMOGLOBIN: 15.2 g/dL — AB (ref 12.0–15.0)
MCH: 30.2 pg (ref 26.0–34.0)
MCHC: 35.8 g/dL (ref 30.0–36.0)
MCV: 84.3 fL (ref 78.0–100.0)
Platelets: 165 10*3/uL (ref 150–400)
RBC: 5.04 MIL/uL (ref 3.87–5.11)
RDW: 12.4 % (ref 11.5–15.5)
WBC: 6.8 10*3/uL (ref 4.0–10.5)

## 2016-06-17 LAB — BASIC METABOLIC PANEL
ANION GAP: 8 (ref 5–15)
BUN: 10 mg/dL (ref 6–20)
CALCIUM: 8.7 mg/dL — AB (ref 8.9–10.3)
CO2: 21 mmol/L — AB (ref 22–32)
Chloride: 105 mmol/L (ref 101–111)
Creatinine, Ser: 0.71 mg/dL (ref 0.44–1.00)
GFR calc Af Amer: >60 mL/min (ref >60)
GFR calc non Af Amer: >60 mL/min (ref >60)
GLUCOSE: 305 mg/dL — AB (ref 65–99)
Potassium: 4.3 mmol/L (ref 3.5–5.1)
Sodium: 134 mmol/L — ABNORMAL LOW (ref 135–145)

## 2016-06-17 LAB — LIPASE, BLOOD: Lipase: 16 U/L (ref 11–51)

## 2016-06-17 LAB — I-STAT TROPONIN, ED: TROPONIN I, POC: 0.01 ng/mL (ref 0.00–0.08)

## 2016-06-17 LAB — I-STAT CREATININE, ED: CREATININE: 0.6 mg/dL (ref 0.44–1.00)

## 2016-06-17 LAB — APTT: aPTT: 28 seconds (ref 24–36)

## 2016-06-17 MED ORDER — ONDANSETRON HCL 4 MG/2ML IJ SOLN
4.0000 mg | Freq: Once | INTRAMUSCULAR | Status: AC
  Administered 2016-06-17: 4 mg via INTRAVENOUS
  Filled 2016-06-17: qty 2

## 2016-06-17 MED ORDER — IOPAMIDOL (ISOVUE-370) INJECTION 76%
INTRAVENOUS | Status: AC
  Administered 2016-06-17: 60 mL
  Filled 2016-06-17: qty 100

## 2016-06-17 MED ORDER — NITROGLYCERIN 0.4 MG SL SUBL: 0.4000 mg | SUBLINGUAL_TABLET | SUBLINGUAL | Status: DC | PRN

## 2016-06-17 MED ORDER — SODIUM CHLORIDE 0.9 % IV BOLUS (SEPSIS)
1000.0000 mL | Freq: Once | INTRAVENOUS | Status: AC
  Administered 2016-06-17: 1000 mL via INTRAVENOUS

## 2016-06-17 NOTE — ED Provider Notes (Signed)
[facility]-EMERGENCY DEPT Provider Note  CSN: [id number] Arrival Date & Time: 06/17/16 @ 1530  History    Chief Complaint Chief Complaint  Patient presents with  . Chest Pain    HPI Sara Hobbs is a 55 y.o. female.  Patient presents emergency part for assessment of chest pain onset of 1 PM today. Patient doses it is a cramping localized to the left breast. Patient endorses it feels similar to the previous myocardial infarction she had in March of this year. Patient has previous MIs 2 per the patient approximately 2 years ago and had stent placement which this past March had reoccluded per the patient. Patient was given aspirin 324 along with 2 sublingual nitroglycerin by EMS. Patient's blood pressure was 170/90 prior to nitroglycerin and 135/74 afterwards. Patient had shortness of breath onset of 9 AM this morning and patient endorses that it is associated with exertion and better with rest and worse with lying recumbent and better when sitting up. Patient versus she also has history of diabetes and is compliant with medications. Chest pain is still present at this time and 3 out of 10.  Past Medical & Surgical History    Past Medical History:  Diagnosis Date  . Angina pectoris (HCC)   . Anxiety   . Arthritis    "hands, neck, back, knees, hips, ankles" (11/22/2014)  . Asthma   . CAD (coronary artery disease)    a. 09/2014: LIMA to LAD, SVG to PDA, EVH via right thigh b. Cardiac Arrest 11/2015: patent LIMA-LAD, CTO if SVG-PDA, 99% stenosis of Prox-Mid Cx w/ DES placed  . CAD, multiple vessel   . Cardiac arrest (HCC) 11/25/2015  . Chest tightness   . CHF (congestive heart failure) (HCC)   . Chronic lower back pain   . Chronic shoulder pain    "right; I've got bad rotator cuff"  . COPD (chronic obstructive pulmonary disease) (HCC)   . Depression   . Diabetes mellitus with complication (HCC)   . GERD (gastroesophageal reflux disease)   . Headache    "monthly" (11/22/2014)  . History  of blood transfusion    "when I had my 1st youngun; when I had MI"  . History of hiatal hernia   . History of stomach ulcers   . Hyperlipidemia   . Hypothyroidism    PMH:  . IDDM (insulin dependent diabetes mellitus) (HCC)   . Incidental lung nodule, > 3mm and < 8mm 11/22/2014   Small non-calcified nodules left lung  . Myocardial infarction 11/2013   PCI w/ drug eluting stent LCx  . Neuropathy due to secondary diabetes (HCC)   . Wears glasses    Patient Active Problem List   Diagnosis Date Noted  . Chest pain 06/17/2016  . Chest pain with moderate risk of acute coronary syndrome 04/21/2016  . Uncontrolled insulin dependent diabetes mellitus (HCC)   . Acute respiratory failure with hypoxia (HCC)   . Cardiac arrest (HCC) 11/25/2015  . PEA (Pulseless electrical activity) (HCC) 11/25/2015  . Respiratory failure (HCC) 11/25/2015  . Wound cellulitis 12/13/2014  . Wound infection 11/23/2014  . Cellulitis of sternum 11/22/2014  . Incidental lung nodule, > 3mm and < 8mm 11/22/2014  . 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 Arthritis   . Back pain, chronic   . Chronic shoulder pain   . COPD (chronic obstructive pulmonary disease) (HCC)   . Diabetes mellitus with complication (HCC)   . CAD, multiple vessel   . Hyperlipidemia   . GERD (  gastroesophageal reflux disease)   . Chest tightness   . Angina pectoris (HCC)   . H/O heart artery stent 11/01/2013   Past Surgical History:  Procedure Laterality Date  . ABDOMINAL HYSTERECTOMY  1990's  . APPLICATION OF WOUND VAC N/A 11/23/2014   Procedure: POSSIBLE APPLICATION OF WOUND VAC;  Surgeon: Purcell Nails, MD;  Location: [facility] OR;  Service: Thoracic;  Laterality: N/A;  . BREAST SURGERY Left 1990's   breast duct surgery  . CARDIAC CATHETERIZATION  09/01/14  . CARDIAC CATHETERIZATION N/A 11/25/2015   Procedure: Left Heart Cath and Cors/Grafts Angiography;  Surgeon: Tonny Bollman, MD;  Location: [facility] INVASIVE CV LAB;  Service: Cardiovascular;  Laterality:  N/A;  . CARDIAC CATHETERIZATION N/A 11/25/2015   Procedure: Coronary Stent Intervention;  Surgeon: Tonny Bollman, MD;  Location: [facility] INVASIVE CV LAB;  Service: Cardiovascular;  Laterality: N/A;  . CESAREAN SECTION  1982; 1984  . COLONOSCOPY    . CORONARY ANGIOPLASTY WITH STENT PLACEMENT  11/11/2013   Drug-eluting stent in LCx  . CORONARY ARTERY BYPASS GRAFT N/A 09/22/2014   Procedure: CORONARY ARTERY BYPASS GRAFTING (CABG);  Surgeon: Purcell Nails, MD;  Location: [facility] OR;  Service: Open Heart Surgery;  Laterality: N/A;  Times 2 using left internal mammary artery and endoscopically harvested right saphenous vein  . ESOPHAGOGASTRODUODENOSCOPY    . GASTRIC RESECTION  1990's   bezoar; "for reflux"  . LAPAROSCOPIC CHOLECYSTECTOMY  2000  . STERNAL WOUND DEBRIDEMENT N/A 11/23/2014   Procedure: SUPERFICIAL STERNAL WOUND DEBRIDEMENT;  Surgeon: Purcell Nails, MD;  Location: [facility] OR;  Service: Thoracic;  Laterality: N/A;  . TEE WITHOUT CARDIOVERSION N/A 09/22/2014   Procedure: TRANSESOPHAGEAL ECHOCARDIOGRAM (TEE);  Surgeon: Purcell Nails, MD;  Location: [facility] OR;  Service: Open Heart Surgery;  Laterality: N/A;    Family & Social History    Family History  Problem Relation Age of Onset  . Adopted: Yes  . Family history unknown: Yes   Social History  Substance Use Topics  . Smoking status: Current Every Day Smoker    Packs/day: 2.00    Years: 41.00    Types: Cigarettes  . Smokeless tobacco: Former Neurosurgeon    Types: Chew    Quit date: 09/13/2005  . Alcohol use No    Home Medications    Prior to Admission medications   Medication Sig Start Date End Date Taking? Authorizing Provider  albuterol (PROVENTIL HFA;VENTOLIN HFA) 108 (90 BASE) MCG/ACT inhaler Inhale 2 puffs into the lungs every 6 (six) hours as needed for wheezing or shortness of breath.   Yes Historical Provider, MD  aspirin EC 81 MG tablet Take 81 mg by mouth daily.   Yes Historical Provider, MD  atorvastatin (LIPITOR) 40 MG tablet Take  1 tablet (40 mg total) by mouth daily at 6 PM. 12/06/15  Yes Scott T Alben Spittle, PA-C  carvedilol (COREG) 3.125 MG tablet Take 3.125 mg by mouth 2 (two) times daily with a meal.   Yes Historical Provider, MD  fentaNYL (DURAGESIC - DOSED MCG/HR) 25 MCG/HR patch Place 25 mcg onto the skin every other day.   Yes Historical Provider, MD  Fluticasone-Salmeterol (ADVAIR) 100-50 MCG/DOSE AEPB Inhale 1 puff into the lungs 2 (two) times daily as needed (shortness of breath).    Yes Historical Provider, MD  insulin glargine (LANTUS) 100 unit/mL SOPN Inject 50-55 Units into the skin See admin instructions. Inject 55 units subcutaneously every morning and 50 units at night   Yes Historical Provider, MD  lisinopril (PRINIVIL,ZESTRIL)  2.5 MG tablet Take 1 tablet (2.5 mg total) by mouth daily. 12/06/15  Yes Scott T Alben Spittle, PA-C  nitroGLYCERIN (NITROSTAT) 0.4 MG SL tablet Place 1 tablet (0.4 mg total) under the tongue every 5 (five) minutes as needed for chest pain. 12/06/15  Yes Scott Moishe Spice, PA-C  promethazine (PHENERGAN) 25 MG tablet Take 25 mg by mouth every 6 (six) hours as needed for nausea or vomiting.   Yes Historical Provider, MD  sertraline (ZOLOFT) 50 MG tablet Take 150 mg by mouth daily.    Yes Historical Provider, MD  ticagrelor (BRILINTA) 90 MG TABS tablet Take 1 tablet (90 mg total) by mouth 2 (two) times daily. 05/14/16  Yes Scott T Alben Spittle, PA-C  tiotropium (SPIRIVA) 18 MCG inhalation capsule Place 18 mcg into inhaler and inhale daily as needed (shortness of breath).    Yes Historical Provider, MD  traZODone (DESYREL) 50 MG tablet Take 50 mg by mouth at bedtime.   Yes Historical Provider, MD    Allergies    Wellbutrin [bupropion]  I reviewed & agree with nursing's documentation on the patient's past medical, surgical, social & family histories as well as their allergies.  Review of Systems  Complete ROS obtained, and is negative except as stated in HPI.  Physical Exam  Updated Vital Signs BP 101/59    Pulse 75   Temp 98.7 F (37.1 C) (Oral)   Resp 12   Ht 5\' 2"  (1.575 m)   Wt 86.2 kg   SpO2 95%   BMI 34.76 kg/m  I have reviewed the triage vital signs and the nursing notes. Physical Exam CONST: Patient alert, well appearing, oriented to person, place and time, ill-appearing.  EYES: PERRLA. EOMI. Conjunctiva w/o d/c. Lids AT w/o swelling.  ENMT: External Nares & Ears AT w/o swelling. Oropharynx patent. MM moist.  NECK: ROM full w/o rigidity. Trachea midline. JVD absent present.  CVS: +S1/S2 w/o obvious murmur. Lower extremities w/o pitting edema.  RESP: Respiratory effort unlabored w/o retractions & accessory muscle use. BS clear bilaterally.  GI: Soft & ND. +BS x 4. TTP absent. Hernia absent. Guarding & Rebound absent.  BACK: CVA TTP absent bilaterally.  SKIN: Skin warm & dry. Turgor good. No rash.  PSYCH: Alert. Oriented. Affect and mood appropriate.  NEURO: CN II-XII grossly intact. Motor exam symmetric w/ upper & lower extremities 5/5 bilaterally. Sensation grossly intact.  MSK: Joints located & stable, w/o obvious dislocation & obvious deformity or crepitus absent w/ Cap refill < 2 sec. Peripheral pulses 2+ & equal in all extremities.   ED Treatments & Results   Labs (only abnormal results are displayed) Labs Reviewed  BASIC METABOLIC PANEL - Abnormal; Notable for the following:       Result Value   Sodium 134 (*)    CO2 21 (*)    Glucose, Bld 305 (*)    Calcium 8.7 (*)    All other components within normal limits  CBC - Abnormal; Notable for the following:    Hemoglobin 15.2 (*)    All other components within normal limits  CBG MONITORING, ED - Abnormal; Notable for the following:    Glucose-Capillary 268 (*)    All other components within normal limits  MRSA PCR SCREENING  APTT  LIPASE, BLOOD  TROPONIN I  TROPONIN I  HEMOGLOBIN A1C  TROPONIN I  I-STAT TROPOININ, ED  I-STAT TROPOININ, ED  I-STAT CREATININE, ED    EKG    EKG  Interpretation  Date/Time:  Sunday  June 17 2016 15:42:14 EDT Ventricular Rate:  88 PR Interval:    QRS Duration: 114 QT Interval:  376 QTC Calculation: 455 R Axis:   91 Text Interpretation:  Sinus rhythm Borderline intraventricular conduction delay Borderline T abnormalities, lateral leads Baseline wander in lead(s) V2 V4 No significant change since last tracing Confirmed by ISAACS MD, Sheria Lang [phone]) on 06/17/2016 4:03:28 PM       Radiology Dg Chest 2 View  Result Date: 06/17/2016 CLINICAL DATA:  Pt c/o chest cramping and pain under left breast since 1pm today. States that she "can't breathe good" hx heart attack with stent placement in March 2017. Smoker 1.5ppd. Diabetic. EXAM: CHEST  2 VIEW COMPARISON:  Chest x-ray dated 04/21/2016. FINDINGS: Heart size and mediastinal contours are normal. Median sternotomy wires appear intact and stable alignment, for CABG. Subtle small nodular densities project over the right lower lung, possible pulmonary nodules. Lungs otherwise clear. No pleural effusion or pneumothorax seen. No acute or suspicious osseous finding. IMPRESSION: 1. Possible small pulmonary nodules within the right lower lung. Recommend chest CT for further characterization. Additionally, a previous chest CT report of 11/22/2014 recommended a 1 year follow-up CT for additional small nodules in the left lung. 2. No evidence of pneumonia or pulmonary edema. 3. Surgical changes of previous CABG. Electronically Signed   By: Bary Richard M.D.   On: 06/17/2016 16:50   Ct Angio Chest Pe W And/or Wo Contrast  Result Date: 06/17/2016 CLINICAL DATA:  Short of breath and chest pain EXAM: CT ANGIOGRAPHY CHEST WITH CONTRAST TECHNIQUE: Multidetector CT imaging of the chest was performed using the standard protocol during bolus administration of intravenous contrast. Multiplanar CT image reconstructions and MIPs were obtained to evaluate the vascular anatomy. CONTRAST:  60 mL Isovue 370 IV  COMPARISON:  Chest x-ray 06/17/2016 FINDINGS: Cardiovascular: Negative for pulmonary embolism. Pulmonary arteries normal in caliber. Negative for aortic aneurysm or dissection. Prior CABG. Heart size normal. No pericardial effusion. Mediastinum/Nodes: Small hiatal hernia. No mass or adenopathy in the mediastinum. Lungs/Pleura: Lungs are clear. No infiltrate or effusion. No lung mass identified. Upper Abdomen: Negative Musculoskeletal: Negative thoracic spine. Prior median sternotomy and CABG. Review of the MIP images confirms the above findings. IMPRESSION: Negative for pulmonary embolism.  No acute abnormality in the chest. Electronically Signed   By: Marlan Palau M.D.   On: 06/17/2016 21:42    Pertinent labs & imaging results that were available during my care of the patient were independently visualized by me and considered in my medical decision making, please see chart for details. Formal interpretation provided by Radiology.  Procedures (including critical care time) Procedures  Medications Ordered in ED Medications  nitroGLYCERIN (NITROSTAT) SL tablet 0.4 mg (not administered)  ticagrelor (BRILINTA) tablet 90 mg (90 mg Oral Given 06/18/16 0221)  sertraline (ZOLOFT) tablet 150 mg (not administered)  traZODone (DESYREL) tablet 50 mg (50 mg Oral Given 06/18/16 0221)  atorvastatin (LIPITOR) tablet 40 mg (not administered)  aspirin EC tablet 81 mg (not administered)  mometasone-formoterol (DULERA) 100-5 MCG/ACT inhaler 2 puff (2 puffs Inhalation Not Given 06/18/16 0122)  tiotropium (SPIRIVA) inhalation capsule 18 mcg (not administered)  fentaNYL (DURAGESIC - dosed mcg/hr) patch 25 mcg (25 mcg Transdermal Not Given 06/18/16 0138)  acetaminophen (TYLENOL) tablet 650 mg (not administered)  ondansetron (ZOFRAN) injection 4 mg (not administered)  insulin glargine (LANTUS) injection 25 Units (25 Units Subcutaneous Given 06/18/16 0221)  insulin aspart (novoLOG) injection 0-9 Units (not  administered)  insulin aspart (novoLOG) injection 0-5  Units (0 Units Subcutaneous Not Given 06/18/16 0143)  0.9 %  sodium chloride infusion ( Intravenous New Bag/Given 06/18/16 0225)  enoxaparin (LOVENOX) injection 40 mg (not administered)  albuterol (PROVENTIL) (2.5 MG/3ML) 0.083% nebulizer solution 2.5 mg (not administered)  ondansetron (ZOFRAN) injection 4 mg (4 mg Intravenous Given 06/17/16 1650)  sodium chloride 0.9 % bolus 1,000 mL (0 mLs Intravenous Stopped 06/17/16 1732)  sodium chloride 0.9 % bolus 1,000 mL (0 mLs Intravenous Stopped 06/17/16 1910)  ondansetron (ZOFRAN) injection 4 mg (4 mg Intravenous Given 06/17/16 2033)  iopamidol (ISOVUE-370) 76 % injection (60 mLs  Contrast Given 06/17/16 2056)    Initial Impression & Plan / ED Course & Results / Final Disposition   Initial Impression & Plan Patient presents emergency department for assessment of chest pain. Due to patient's significant risk factors EKG and serial troponin obtained along with chest x-ray and screening laboratory work. Patient is in no respiratory distress and has no vital signs as concerning for sepsis at this time. Do not believe to be related to infectious etiology at this time and no concerning exam findings on lung exam. Patient has pulses present in all extremities no abnormal sensory deficits or gross variation and blood pressures in all 4's remedies and have considered aortic dissection and do not believe likely at this time. Patient also denies any back pain or radiating chest pain into back and chest pain is not described as tearing. Due to onset of shortness of breath prior to chest pain consideration involves pulmonary embolus and therefore I obtained CT chest angiography.  ED Course & Results Upon review patient's EKG appreciate patient has sinus rhythm heart rate 88 with nonspecific ST segment abnormalities in lateral leads. PR interval normal length as is QTC. Consistent with intraventricular conduction  delay given widened QRS at 114 ms. No evidence of ST segment elevation myocardial infarction, hyperkalemia or preexcitation syndrome.  Chest x-ray reviewed and I appreciate concern for pulmonary nodules and right lower lobe however no obvious focal infiltrate or obvious pneumothorax. Per review of CTA chest I appreciate no acute PE or PNA.  Per my review of patient's progress note in cardiology clinic on 05/14/2016 patient has known coronary artery disease and is status post CABG which was, located by dehiscence of sternum and long protracted course of wound VAC. Per review of the note on 317 patient had PEA arrest and left heart cath revealed three-vessel stenosis of proximal left circumflex LIMA-LAD and chronic occlusion of the SVG-PDA and had subsequent placement of PCI with drug-eluting stent to left circumflex.  Per my review patient's last echo upon 11/26/15 patient has documented EF of 45-50%. Patient also had diffuse hypokinesis worse over septal and posterior lateral walls.  Procedure: Emergency Focused Ultrasound Exam Limited Ultrasound evaluation of the Heart & Pericardium & IVC. Date: 06/17/2016 / Time: 5:06 PM Performed and interpreted by Jonette EvaBrad Donterrius Santucci, MD, at the bedside. Authorizing Provider, Dr. Erma HeritageIsaacs, also reviewed the images.  Indication: Hypotension. Views Obtained: Parasternal long axis, parasternal short axis, apical 4 view, subcostal and IVC. Images were archived per Department policy. Study was limited by this being an emergent procedure, the patient's body habitus, & discomfort/pain.  Findings: Overall cardiac function reduced, no obvious pericardial effusion, IVC collapsible upon respiratory respiration.  Interpretation: Suspect CVP to be significantly low along with moderate decreased EF and no evidence of pericardial tamponade.  Comments: NA  CPT Code(s): [id number] (Limited Transthoracic Cardiac)  Final Disposition Reassessment of the patient reveals BP markedly  improved  s/o IVF. However due to known CAD will require observation overnight and consideration for further cardiac evaluation.  In light of above & the complexity of the patient's medical condition, I believe the patient will require admission for continued medical intervention. ED Course in its entirety was reviewed w/ the patient and relative(s). I therefore consulted the Family Medicine Service for admission & we discussed the patient's ED course & they have agreed to admit. They request no further interventions prior to the patient's transport from the ED. Patient stable for transport. Level of Care determined by the Admitting Service.   Final Clinical Impression & ED Diagnoses   1. Chest pain, unspecified type    Patient care discussed with the attending physician, Dr. Erma Heritage, who oversaw their evaluation & treatment & voiced agreement.  Note: This document was prepared using Dragon voice recognition software and may include unintentional dictation errors.  House Officer: Jonette Eva, MD, Emergency Medicine Resident.   Jonette Eva, MD 06/18/16 [id number]    Jonette Eva, MD 06/18/16 3329    Shaune Pollack, MD 06/18/16 1155

## 2016-06-17 NOTE — ED Notes (Signed)
Patient transported to CT 

## 2016-06-17 NOTE — ED Notes (Signed)
Patient returned from CT

## 2016-06-17 NOTE — ED Notes (Signed)
Called Lab for status of blood draw receipt.

## 2016-06-17 NOTE — ED Notes (Signed)
Admitting at bedside 

## 2016-06-17 NOTE — H&P (Signed)
[facility] History and Physical Service Pager: [phone]  Patient name: Sara Hobbs Medical record number: [id number] Date of birth: 1961/02/23 Age: 55 y.o. Gender: female  Primary Care Provider: Lavell IslamLOWARD,DAVIS L, MD Consultants: Cardiology  Code Status: Full code  Chief Complaint: Chest pain  Assessment and Plan: Sara Hobbs is a 55 y.o. female presenting with chest pain . PMH is significant for CAD status post CABG and recent stent placement, CHF, COPD, type 2 diabetes  Chest pain: Crampy pain under her left breast, preceded by acute shortness of breath. Feels similar to previous MI in March 2017. Shortness of breath resolved. CTA negative for PE. First troponin negative. EKG showed no acute changes since last study. HEART score 5.  - Admit to SDU, attending Dr. Randolm IdolFletke - Trend troponins - Vital signs per floor protocol - repeat EKG - Consult cards  CAD: Status post CABG, and ACS with PEA arrest in 3/17, found to have significant stenosis in the native left circumflex treated with drug-eluting stent.  - chest pain management as above - f/u Hemoglobin A1C - continue brilinta 90mg  daily - continue coreg 3.125mg  BID - continue lisinopril 2.5mg  daily - vitals per floor protocol  Leg pain: Patient complains of right leg pain at night. Describes "knots in calf".  Had very mild pain with palpation.  No swelling or edema.  CTA negative for PE. Well's Score for DVT -1, making this very unlikely cause of pain.  - continue to monitor.  - Consider LE Dopplers if pain worsens, or erythema or swelling develops  COPD: Patient smokes 1.5 packs per day down from 3. Denies shortness of breath at this time and has clear lungs. No home O2.  Takes albuterol PRN and advair and spiriva.  - Continue home regimen  Ischemic cardiomyopathy Last EF 45-50% 3/17. Denies SOB, not volume overloaded on physical exam. CXR shows no signs of pulmonary edema and lungs  were clear.  - continue to monitor - Continue Coreg, Brilinta, lisinopril  Type 2 diabetes: Most recent A1c was 11.8 in march. CBG upon presentation was 305. On 55U Lantus in AM, 50U Lantus PM. - 25 units Lantus at night - SSI - f/u CBGs with meals and night time.  - Follow up hemoglobin A1C  Hyperlipidemia: Last lipid panel 01/2016 with normal total cholesterol (140), elevated triglycerides (283), and decreased HDL (31). - continue lipitor 40mg ; consider increasing to 80mg .   Anxiety/depression: Stable - continue 150mg  zoloft  FEN/GI: heart healthy carb modified diet, NS@100mL /hr Prophylaxis: Brilinta /lovenox  Disposition: admit to telemetry/observation under attending Fletke  History of Present Illness:  Sara Hobbs is a 55 y.o. female presenting with chest pain.  Woke up at 9:30, felt sick to stomach and had diarrhea, then laid on the couch and at 1PM started getting a crampy feeling under L breast and had trouble breathing. Started vomiting in AM around same time as developed diarrhea. No blood in vomit. Husband called 911 after developed chest pain. Still having crampy pain in L chest. Doesn't think breathing is any better than when she first arrived. Still nauseated and has headache. Also had some trouble breathing yesterday too but didn't feel the same as today. Received nitrox2 while en route and stated that this helped a bit. Had EKG in ED that was unchanged since last studies and negative troponins. Smokes 1.5ppd (down from 3ppd).   Feels similar to when came in in March and at that time found to have MI due to  significant stenosis in the left circumflex requiring drug-eluting stent. Patient had PEA arrest while in triage for this admission. Has been following up with cardiology. Went within past couple months and has another appt in December. Last saw Dr. Excell Seltzer.     Review Of Systems: Per HPI with the following additions: Denies any diarrhea, fevers, shortness of breath or  chills.   ROS  Patient Active Problem List   Diagnosis Date Noted  . Chest pain 06/17/2016  . Chest pain with moderate risk of acute coronary syndrome 04/21/2016  . Uncontrolled insulin dependent diabetes mellitus (HCC)   . Acute respiratory failure with hypoxia (HCC)   . Cardiac arrest (HCC) 11/25/2015  . PEA (Pulseless electrical activity) (HCC) 11/25/2015  . Respiratory failure (HCC) 11/25/2015  . Wound cellulitis 12/13/2014  . Wound infection 11/23/2014  . Cellulitis of sternum 11/22/2014  . Incidental lung nodule, > 72mm and < 3mm 11/22/2014  . 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 Arthritis   . Back pain, chronic   . Chronic shoulder pain   . COPD (chronic obstructive pulmonary disease) (HCC)   . Diabetes mellitus with complication (HCC)   . CAD, multiple vessel   . Hyperlipidemia   . GERD (gastroesophageal reflux disease)   . Chest tightness   . Angina pectoris (HCC)   . H/O heart artery stent 11/01/2013    Past Medical History: Past Medical History:  Diagnosis Date  . Angina pectoris (HCC)   . Anxiety   . Arthritis    "hands, neck, back, knees, hips, ankles" (11/22/2014)  . Asthma   . CAD (coronary artery disease)    a. 09/2014: LIMA to LAD, SVG to PDA, EVH via right thigh b. Cardiac Arrest 11/2015: patent LIMA-LAD, CTO if SVG-PDA, 99% stenosis of Prox-Mid Cx w/ DES placed  . CAD, multiple vessel   . Cardiac arrest (HCC) 11/25/2015  . Chest tightness   . CHF (congestive heart failure) (HCC)   . Chronic lower back pain   . Chronic shoulder pain    "right; I've got bad rotator cuff"  . COPD (chronic obstructive pulmonary disease) (HCC)   . Depression   . Diabetes mellitus with complication (HCC)   . GERD (gastroesophageal reflux disease)   . Headache    "monthly" (11/22/2014)  . History of blood transfusion    "when I had my 1st youngun; when I had MI"  . History of hiatal hernia   . History of stomach ulcers   . Hyperlipidemia   . Hypothyroidism    PMH:  . IDDM  (insulin dependent diabetes mellitus) (HCC)   . Incidental lung nodule, > 47mm and < 7mm 11/22/2014   Small non-calcified nodules left lung  . Myocardial infarction 11/2013   PCI w/ drug eluting stent LCx  . Neuropathy due to secondary diabetes (HCC)   . Wears glasses     Past Surgical History: Past Surgical History:  Procedure Laterality Date  . ABDOMINAL HYSTERECTOMY  1990's  . APPLICATION OF WOUND VAC N/A 11/23/2014   Procedure: POSSIBLE APPLICATION OF WOUND VAC;  Surgeon: Purcell Nails, MD;  Location: [facility] OR;  Service: Thoracic;  Laterality: N/A;  . BREAST SURGERY Left 1990's   breast duct surgery  . CARDIAC CATHETERIZATION  09/01/14  . CARDIAC CATHETERIZATION N/A 11/25/2015   Procedure: Left Heart Cath and Cors/Grafts Angiography;  Surgeon: Tonny Bollman, MD;  Location: [facility] INVASIVE CV LAB;  Service: Cardiovascular;  Laterality: N/A;  . CARDIAC CATHETERIZATION N/A 11/25/2015   Procedure:  Coronary Stent Intervention;  Surgeon: Tonny Bollman, MD;  Location: [facility] INVASIVE CV LAB;  Service: Cardiovascular;  Laterality: N/A;  . CESAREAN SECTION  1982; 1984  . COLONOSCOPY    . CORONARY ANGIOPLASTY WITH STENT PLACEMENT  11/11/2013   Drug-eluting stent in LCx  . CORONARY ARTERY BYPASS GRAFT N/A 09/22/2014   Procedure: CORONARY ARTERY BYPASS GRAFTING (CABG);  Surgeon: Purcell Nails, MD;  Location: [facility] OR;  Service: Open Heart Surgery;  Laterality: N/A;  Times 2 using left internal mammary artery and endoscopically harvested right saphenous vein  . ESOPHAGOGASTRODUODENOSCOPY    . GASTRIC RESECTION  1990's   bezoar; "for reflux"  . LAPAROSCOPIC CHOLECYSTECTOMY  2000  . STERNAL WOUND DEBRIDEMENT N/A 11/23/2014   Procedure: SUPERFICIAL STERNAL WOUND DEBRIDEMENT;  Surgeon: Purcell Nails, MD;  Location: [facility] OR;  Service: Thoracic;  Laterality: N/A;  . TEE WITHOUT CARDIOVERSION N/A 09/22/2014   Procedure: TRANSESOPHAGEAL ECHOCARDIOGRAM (TEE);  Surgeon: Purcell Nails, MD;  Location: [facility] OR;  Service:  Open Heart Surgery;  Laterality: N/A;    Social History: Social History  Substance Use Topics  . Smoking status: Current Every Day Smoker    Packs/day: 2.00    Years: 41.00    Types: Cigarettes  . Smokeless tobacco: Former Neurosurgeon    Types: Chew    Quit date: 09/13/2005  . Alcohol use No    Family History: Family History  Problem Relation Age of Onset  . Adopted: Yes  . Family history unknown: Yes    Allergies and Medications: Allergies  Allergen Reactions  . Wellbutrin [Bupropion] Other (See Comments) and Palpitations    "Makes me feel like im having a heart attack" SEVERE CHEST PAIN   No current facility-administered medications on file prior to encounter.    Current Outpatient Prescriptions on File Prior to Encounter  Medication Sig Dispense Refill  . albuterol (PROVENTIL HFA;VENTOLIN HFA) 108 (90 BASE) MCG/ACT inhaler Inhale 2 puffs into the lungs every 6 (six) hours as needed for wheezing or shortness of breath.    Marland Kitchen aspirin EC 81 MG tablet Take 81 mg by mouth daily.    Marland Kitchen atorvastatin (LIPITOR) 40 MG tablet Take 1 tablet (40 mg total) by mouth daily at 6 PM. 90 tablet 3  . carvedilol (COREG) 3.125 MG tablet Take 3.125 mg by mouth 2 (two) times daily with a meal.    . fentaNYL (DURAGESIC - DOSED MCG/HR) 25 MCG/HR patch Place 25 mcg onto the skin every other day.    . Fluticasone-Salmeterol (ADVAIR) 100-50 MCG/DOSE AEPB Inhale 1 puff into the lungs 2 (two) times daily as needed (shortness of breath).     . insulin glargine (LANTUS) 100 unit/mL SOPN Inject 50-55 Units into the skin See admin instructions. Inject 55 units subcutaneously every morning and 50 units at night    . lisinopril (PRINIVIL,ZESTRIL) 2.5 MG tablet Take 1 tablet (2.5 mg total) by mouth daily. 90 tablet 3  . nitroGLYCERIN (NITROSTAT) 0.4 MG SL tablet Place 1 tablet (0.4 mg total) under the tongue every 5 (five) minutes as needed for chest pain. 25 tablet 3  . promethazine (PHENERGAN) 25 MG tablet Take 25  mg by mouth every 6 (six) hours as needed for nausea or vomiting.    . sertraline (ZOLOFT) 50 MG tablet Take 150 mg by mouth daily.     . ticagrelor (BRILINTA) 90 MG TABS tablet Take 1 tablet (90 mg total) by mouth 2 (two) times daily. 180 tablet 3  . tiotropium (SPIRIVA)  18 MCG inhalation capsule Place 18 mcg into inhaler and inhale daily as needed (shortness of breath).     . traZODone (DESYREL) 50 MG tablet Take 50 mg by mouth at bedtime.      Objective: BP 101/59   Pulse 75   Temp 98.7 F (37.1 C) (Oral)   Resp 12   Ht 5\' 2"  (1.575 m)   Wt 190 lb 0.6 oz (86.2 kg)   SpO2 95%   BMI 34.76 kg/m  Exam: General: 55 year old female lying in bed appearing uncomfortable Eyes: EOMI, PERRL, non-injected ENTM: Dry mucous membranes, clear oropharynx Neck: Supple Cardiovascular: Regular rate and rhythm, S1-S2 present, no murmurs Respiratory: Normal work of breathing on RA, clear to auscultation bilaterally, no wheezing or rhonchi Gastrointestinal: Soft, nontender, nondistended MSK: Moves all extremities, mild right calf tenderness, without erythema or induration or swelling, no palpable cords Extremities: No cyanosis clubbing or edema Derm: Warm and dry Neuro: Alert and oriented 3, no focal neurological deficits Psych: Appropriate mood and affect  Labs and Imaging: CBC BMET   Recent Labs Lab 06/17/16 1930  WBC 6.8  HGB 15.2*  HCT 42.5  PLT 165    Recent Labs Lab 06/17/16 1930 06/17/16 2031  NA 134*  --   K 4.3  --   CL 105  --   CO2 21*  --   BUN 10  --   CREATININE 0.71 0.60  GLUCOSE 305*  --   CALCIUM 8.7*  --       Marquette Saa, MD 06/18/2016, 6:34 AM PGY-1, [facility] FPTS Intern pager: [phone], text pages welcome  UPPER LEVEL ADDENDUM  I have read the above note and made revisions highlighted in orange.  Tarri Abernethy, MD, MPH PGY-2 [facility] Pager [phone]

## 2016-06-17 NOTE — ED Triage Notes (Signed)
Per EMS: pt sts left sided CP under neath breast that is similar to when had MI in March; pt with hx of cardiac arrest in past; IV 20g L AC; 2 SL nitro given without relief; pt given 324mg  ASA

## 2016-06-18 DIAGNOSIS — Z955 Presence of coronary angioplasty implant and graft: Secondary | ICD-10-CM

## 2016-06-18 DIAGNOSIS — M549 Dorsalgia, unspecified: Secondary | ICD-10-CM

## 2016-06-18 DIAGNOSIS — R079 Chest pain, unspecified: Secondary | ICD-10-CM | POA: Diagnosis not present

## 2016-06-18 DIAGNOSIS — E1165 Type 2 diabetes mellitus with hyperglycemia: Secondary | ICD-10-CM

## 2016-06-18 DIAGNOSIS — I2510 Atherosclerotic heart disease of native coronary artery without angina pectoris: Secondary | ICD-10-CM

## 2016-06-18 DIAGNOSIS — Z794 Long term (current) use of insulin: Secondary | ICD-10-CM

## 2016-06-18 DIAGNOSIS — Z951 Presence of aortocoronary bypass graft: Secondary | ICD-10-CM | POA: Diagnosis not present

## 2016-06-18 DIAGNOSIS — J439 Emphysema, unspecified: Secondary | ICD-10-CM

## 2016-06-18 DIAGNOSIS — I209 Angina pectoris, unspecified: Secondary | ICD-10-CM

## 2016-06-18 DIAGNOSIS — G8929 Other chronic pain: Secondary | ICD-10-CM

## 2016-06-18 DIAGNOSIS — E785 Hyperlipidemia, unspecified: Secondary | ICD-10-CM

## 2016-06-18 DIAGNOSIS — K219 Gastro-esophageal reflux disease without esophagitis: Secondary | ICD-10-CM

## 2016-06-18 DIAGNOSIS — E118 Type 2 diabetes mellitus with unspecified complications: Secondary | ICD-10-CM | POA: Diagnosis not present

## 2016-06-18 LAB — GLUCOSE, CAPILLARY
Glucose-Capillary: 213 mg/dL — ABNORMAL HIGH (ref 65–99)
Glucose-Capillary: 207 mg/dL — ABNORMAL HIGH (ref 65–99)
Glucose-Capillary: 186 mg/dL — ABNORMAL HIGH (ref 65–99)
Glucose-Capillary: 231 mg/dL — ABNORMAL HIGH (ref 65–99)

## 2016-06-18 LAB — CBG MONITORING, ED: Glucose-Capillary: 268 mg/dL — ABNORMAL HIGH (ref 65–99)

## 2016-06-18 LAB — MRSA PCR SCREENING: MRSA BY PCR: NEGATIVE

## 2016-06-18 LAB — TROPONIN I: Troponin I: <0.03 ng/mL (ref <0.03)

## 2016-06-18 MED ORDER — INSULIN GLARGINE 100 UNIT/ML ~~LOC~~ SOLN
25.0000 [IU] | Freq: Two times a day (BID) | SUBCUTANEOUS | Status: DC
  Administered 2016-06-18: 25 [IU] via SUBCUTANEOUS
  Filled 2016-06-18 (×2): qty 0.25

## 2016-06-18 MED ORDER — INSULIN ASPART 100 UNIT/ML ~~LOC~~ SOLN
0.0000 [IU] | Freq: Every day | SUBCUTANEOUS | Status: DC
  Administered 2016-06-18: 2 [IU] via SUBCUTANEOUS

## 2016-06-18 MED ORDER — SODIUM CHLORIDE 0.9 % IV SOLN: INTRAVENOUS | Status: DC

## 2016-06-18 MED ORDER — TIOTROPIUM BROMIDE MONOHYDRATE 18 MCG IN CAPS: 18.0000 ug | ORAL_CAPSULE | Freq: Every day | RESPIRATORY_TRACT | Status: DC

## 2016-06-18 MED ORDER — ASPIRIN 81 MG PO CHEW
81.0000 mg | CHEWABLE_TABLET | ORAL | Status: AC
  Administered 2016-06-19: 81 mg via ORAL
  Filled 2016-06-18: qty 1

## 2016-06-18 MED ORDER — INSULIN ASPART 100 UNIT/ML ~~LOC~~ SOLN
0.0000 [IU] | Freq: Three times a day (TID) | SUBCUTANEOUS | Status: DC
  Administered 2016-06-18: 3 [IU] via SUBCUTANEOUS
  Administered 2016-06-18: 2 [IU] via SUBCUTANEOUS
  Administered 2016-06-18: 3 [IU] via SUBCUTANEOUS
  Administered 2016-06-19 – 2016-06-20 (×2): 2 [IU] via SUBCUTANEOUS

## 2016-06-18 MED ORDER — ASPIRIN EC 81 MG PO TBEC
81.0000 mg | DELAYED_RELEASE_TABLET | Freq: Every day | ORAL | Status: DC
  Administered 2016-06-18 – 2016-06-20 (×2): 81 mg via ORAL
  Filled 2016-06-18 (×3): qty 1

## 2016-06-18 MED ORDER — TIOTROPIUM BROMIDE MONOHYDRATE 18 MCG IN CAPS: 18.0000 ug | ORAL_CAPSULE | Freq: Every day | RESPIRATORY_TRACT | Status: DC | PRN

## 2016-06-18 MED ORDER — TICAGRELOR 90 MG PO TABS
90.0000 mg | ORAL_TABLET | Freq: Two times a day (BID) | ORAL | Status: DC
Start: 2016-06-18 — End: 2016-06-20
  Administered 2016-06-18 – 2016-06-20 (×6): 90 mg via ORAL
  Filled 2016-06-18 (×6): qty 1

## 2016-06-18 MED ORDER — ALBUTEROL SULFATE HFA 108 (90 BASE) MCG/ACT IN AERS: 2.0000 | INHALATION_SPRAY | Freq: Four times a day (QID) | RESPIRATORY_TRACT | Status: DC | PRN

## 2016-06-18 MED ORDER — INSULIN GLARGINE 100 UNIT/ML ~~LOC~~ SOLN
30.0000 [IU] | Freq: Two times a day (BID) | SUBCUTANEOUS | Status: DC
  Administered 2016-06-18 – 2016-06-20 (×3): 30 [IU] via SUBCUTANEOUS
  Filled 2016-06-18 (×5): qty 0.3

## 2016-06-18 MED ORDER — ONDANSETRON HCL 4 MG/2ML IJ SOLN
4.0000 mg | Freq: Four times a day (QID) | INTRAMUSCULAR | Status: DC | PRN
  Administered 2016-06-18 – 2016-06-20 (×7): 4 mg via INTRAVENOUS
  Filled 2016-06-18 (×7): qty 2

## 2016-06-18 MED ORDER — CARVEDILOL 3.125 MG PO TABS
3.1250 mg | ORAL_TABLET | Freq: Two times a day (BID) | ORAL | Status: DC
  Administered 2016-06-18 – 2016-06-20 (×3): 3.125 mg via ORAL
  Filled 2016-06-18 (×4): qty 1

## 2016-06-18 MED ORDER — MOMETASONE FURO-FORMOTEROL FUM 100-5 MCG/ACT IN AERO
2.0000 | INHALATION_SPRAY | Freq: Two times a day (BID) | RESPIRATORY_TRACT | Status: DC
  Administered 2016-06-18 (×2): 2 via RESPIRATORY_TRACT
  Filled 2016-06-18: qty 8.8

## 2016-06-18 MED ORDER — INSULIN GLARGINE 100 UNIT/ML ~~LOC~~ SOLN
25.0000 [IU] | Freq: Every day | SUBCUTANEOUS | Status: DC
  Administered 2016-06-18: 25 [IU] via SUBCUTANEOUS
  Filled 2016-06-18: qty 0.25

## 2016-06-18 MED ORDER — SODIUM CHLORIDE 0.9% FLUSH: 3.0000 mL | INTRAVENOUS | Status: DC | PRN

## 2016-06-18 MED ORDER — ALBUTEROL SULFATE (2.5 MG/3ML) 0.083% IN NEBU: 2.5000 mg | INHALATION_SOLUTION | Freq: Four times a day (QID) | RESPIRATORY_TRACT | Status: DC | PRN

## 2016-06-18 MED ORDER — ACETAMINOPHEN 325 MG PO TABS
650.0000 mg | ORAL_TABLET | ORAL | Status: DC | PRN
Start: 2016-06-18 — End: 2016-06-20
  Administered 2016-06-18: 650 mg via ORAL
  Filled 2016-06-18: qty 2

## 2016-06-18 MED ORDER — TRAZODONE HCL 50 MG PO TABS
50.0000 mg | ORAL_TABLET | Freq: Every day | ORAL | Status: DC
  Administered 2016-06-18 – 2016-06-19 (×3): 50 mg via ORAL
  Filled 2016-06-18 (×3): qty 1

## 2016-06-18 MED ORDER — SODIUM CHLORIDE 0.9 % IV SOLN: 250.0000 mL | INTRAVENOUS | Status: DC | PRN

## 2016-06-18 MED ORDER — SERTRALINE HCL 50 MG PO TABS
150.0000 mg | ORAL_TABLET | Freq: Every day | ORAL | Status: DC
  Administered 2016-06-18 – 2016-06-20 (×3): 150 mg via ORAL
  Filled 2016-06-18 (×3): qty 1

## 2016-06-18 MED ORDER — ATORVASTATIN CALCIUM 40 MG PO TABS
40.0000 mg | ORAL_TABLET | Freq: Every day | ORAL | Status: DC
Start: 2016-06-18 — End: 2016-06-20
  Administered 2016-06-18 – 2016-06-19 (×2): 40 mg via ORAL
  Filled 2016-06-18 (×2): qty 1

## 2016-06-18 MED ORDER — ENOXAPARIN SODIUM 40 MG/0.4ML ~~LOC~~ SOLN
40.0000 mg | Freq: Every day | SUBCUTANEOUS | Status: DC
  Administered 2016-06-18 – 2016-06-19 (×2): 40 mg via SUBCUTANEOUS
  Filled 2016-06-18 (×2): qty 0.4

## 2016-06-18 MED ORDER — SODIUM CHLORIDE 0.9% FLUSH: 3.0000 mL | Freq: Two times a day (BID) | INTRAVENOUS | Status: DC

## 2016-06-18 MED ORDER — FENTANYL 25 MCG/HR TD PT72
25.0000 ug | MEDICATED_PATCH | TRANSDERMAL | Status: DC
  Administered 2016-06-18 – 2016-06-20 (×2): 25 ug via TRANSDERMAL
  Filled 2016-06-18 (×2): qty 1

## 2016-06-18 MED ORDER — SODIUM CHLORIDE 0.9 % IV SOLN
INTRAVENOUS | Status: DC
  Administered 2016-06-18 – 2016-06-19 (×3): via INTRAVENOUS

## 2016-06-18 NOTE — Progress Notes (Addendum)
Inpatient Diabetes Program Recommendations  AACE/ADA: New Consensus Statement on Inpatient Glycemic Control (2015)  Target Ranges:  Prepandial:   less than 140 mg/dL      Peak postprandial:   less than 180 mg/dL (1-2 hours)      Critically ill patients:  140 - 180 mg/dL   Results for OBELIA, BONELLO (MRN [id number]) as of 06/18/2016 11:19  Ref. Range 06/18/2016 01:30 06/18/2016 08:14  Glucose-Capillary Latest Ref Range: 65 - 99 mg/dL 268 (H) 213 (H)  Results for KAMBRA, BEACHEM (MRN [id number]) as of 06/18/2016 11:19  Ref. Range 07/21/2015 14:17 11/25/2015 17:54  Hemoglobin A1C Latest Ref Range: 4.8 - 5.6 % 10.2 (H) 11.8 (H)    Review of Glycemic Control  Diabetes history: IDDM2 not currently controlled based on last A1C, Obesity Outpatient Diabetes medications: Lantus 50 units at night and 55 units in morning Current orders for Inpatient glycemic control: Novolog 0-9 units TIDAC, 0-5 units QHS; Lantus 25 units BID  Inpatient Diabetes Program Recommendations: Please consider:  Outpatient Diabetes education and support to improve self-management;  Increasing Lantus to 30 units BID.  Note: Diabetes Coordinator Consultation Assessment made today:  Ineffective self-management of diabetes  Goals to be met by discharge: 1.  Patient will identify plan for self-management of Diabetes care management needs. 2.  Patient will schedule a follow up appoint with PCP  Interventions: 1.  Patient taught the following (teach back and/or return demonstration by this RN):      When to call MD      Sick day rules      Hypo/Hyperglycemia      Medications at D/C (what these are, why taking, when taking, how taking, common           S.E.'s)      CBG monitoring      How/why to check feet every day      Why exercise is important      Carb modified diet  2.  Identify barriers and facilitators to self-management goals:       Not wanting to go to outpatient education and support      Significant  co-morbidities      Current smoker      Able to live independently  3.  Support systems:       Husband  Thank you,  Windy Carina, RN, BSN Diabetes Coordinator Inpatient Diabetes Program [phone] (Team Pager) [phone] ([facility] office) [phone] [facility] office) [phone] [facility] office)

## 2016-06-18 NOTE — Consult Note (Signed)
Patient ID: Sara Hobbs MRN: [id number], DOB/AGE: August 20, 1961   Admit date: 06/17/2016   Reason for Consult: Chest Pain Requesting MD: Dr. Randolm Idol, Family Medicine   Primary Physician: Lavell Islam, MD Primary Cardiologist: Dr. Excell Seltzer   Pt. Profile:  55 y/o female with h/o CAD with remote CABG, recent PEA arrest 11/2015 subsequent to occluded native LCx requiring emergent PCI, ischemic cardiomyopathy with EF of 45-50% on echo 11/2015, IDT2DM, HTN, HLD, tobacco use and COPD, admitted for acute CP evaluation.   Problem List  Past Medical History:  Diagnosis Date  . Angina pectoris (HCC)   . Anxiety   . Arthritis    "hands, neck, back, knees, hips, ankles" (11/22/2014)  . Asthma   . CAD (coronary artery disease)    a. 09/2014: LIMA to LAD, SVG to PDA, EVH via right thigh b. Cardiac Arrest 11/2015: patent LIMA-LAD, CTO if SVG-PDA, 99% stenosis of Prox-Mid Cx w/ DES placed  . CAD, multiple vessel   . Cardiac arrest (HCC) 11/25/2015  . Chest tightness   . CHF (congestive heart failure) (HCC)   . Chronic lower back pain   . Chronic shoulder pain    "right; I've got bad rotator cuff"  . COPD (chronic obstructive pulmonary disease) (HCC)   . Depression   . Diabetes mellitus with complication (HCC)   . GERD (gastroesophageal reflux disease)   . Headache    "monthly" (11/22/2014)  . History of blood transfusion    "when I had my 1st youngun; when I had MI"  . History of hiatal hernia   . History of stomach ulcers   . Hyperlipidemia   . Hypothyroidism    PMH:  . IDDM (insulin dependent diabetes mellitus) (HCC)   . Incidental lung nodule, > 3mm and < 8mm 11/22/2014   Small non-calcified nodules left lung  . Myocardial infarction 11/2013   PCI w/ drug eluting stent LCx  . Neuropathy due to secondary diabetes (HCC)   . Wears glasses     Past Surgical History:  Procedure Laterality Date  . ABDOMINAL HYSTERECTOMY  1990's  . APPLICATION OF WOUND VAC N/A 11/23/2014   Procedure: POSSIBLE APPLICATION OF WOUND VAC;  Surgeon: Purcell Nails, MD;  Location: [facility] OR;  Service: Thoracic;  Laterality: N/A;  . BREAST SURGERY Left 1990's   breast duct surgery  . CARDIAC CATHETERIZATION  09/01/14  . CARDIAC CATHETERIZATION N/A 11/25/2015   Procedure: Left Heart Cath and Cors/Grafts Angiography;  Surgeon: Tonny Bollman, MD;  Location: [facility] INVASIVE CV LAB;  Service: Cardiovascular;  Laterality: N/A;  . CARDIAC CATHETERIZATION N/A 11/25/2015   Procedure: Coronary Stent Intervention;  Surgeon: Tonny Bollman, MD;  Location: [facility] INVASIVE CV LAB;  Service: Cardiovascular;  Laterality: N/A;  . CESAREAN SECTION  1982; 1984  . COLONOSCOPY    . CORONARY ANGIOPLASTY WITH STENT PLACEMENT  11/11/2013   Drug-eluting stent in LCx  . CORONARY ARTERY BYPASS GRAFT N/A 09/22/2014   Procedure: CORONARY ARTERY BYPASS GRAFTING (CABG);  Surgeon: Purcell Nails, MD;  Location: [facility] OR;  Service: Open Heart Surgery;  Laterality: N/A;  Times 2 using left internal mammary artery and endoscopically harvested right saphenous vein  . ESOPHAGOGASTRODUODENOSCOPY    . GASTRIC RESECTION  1990's   bezoar; "for reflux"  . LAPAROSCOPIC CHOLECYSTECTOMY  2000  . STERNAL WOUND DEBRIDEMENT N/A 11/23/2014   Procedure: SUPERFICIAL STERNAL WOUND DEBRIDEMENT;  Surgeon: Purcell Nails, MD;  Location: [facility] OR;  Service: Thoracic;  Laterality: N/A;  . TEE WITHOUT CARDIOVERSION  N/A 09/22/2014   Procedure: TRANSESOPHAGEAL ECHOCARDIOGRAM (TEE);  Surgeon: Purcell Nails, MD;  Location: [facility] OR;  Service: Open Heart Surgery;  Laterality: N/A;     Allergies  Allergies  Allergen Reactions  . Wellbutrin [Bupropion] Other (See Comments) and Palpitations    "Makes me feel like im having a heart attack" SEVERE CHEST PAIN    HPI  The patient is a 55 y/o female, followed by Dr. Excell Seltzer, who has a complex medical history with coronary artery disease and remote CABG, COPD, insulin dependent type 2 diabetes, hypertension,  hyperlipidemia, tobacco use and chronic pain syndrome. She presented in the spring, on 11/25/2015, with chest pain, shortness of breath and PEA arrest in the emergency room. She was resuscitated and underwent emergency cardiac catheterization showing three-vessel coronary artery disease with severe stenosis of the proximal left circumflex. She had patency of the LIMA to LAD graft and chronic occlusion of the vein graft to PDA. She underwent PCI of the native left circumflex without complication. She's had a lot of musculoskeletal pain after CPR. Her last echo 3/17 showed EF of 45-50%.  She presented to [facility] on 06/18/15 with a complaint of acute chest pain. She was in her usual state of health until 1 PM yesterday, when she developed sudden onset left sided chest pain occurring at rest. Pain is underneath her left breast. She was sitting down reading a book. Described as a "cramping" pain. Does not radiate. Somewhat worse with exertion. Not pleuritic. Similar to her CP she had in March prior to her PEA arrest. Pain was 5/10 when she called EMS. Pain eased off after 4 baby ASA and SL NTG en route to hospital.    In ED, EKG showed NSR w/o acute changes. CXR showed showed possible small pulmonary nodules in right lower lung, no acute PNA/Pulmonary Edema. CTA negative for PE, no nodules seen on CT scan.  Negative for aortic aneurysm or dissection.  Lungs are clear. No infiltrate or effusion. No lung mass identified.  She was admitted by family medicine. Cardiac enzymes have been cycled x 3 and are negative, ruling her out for MI.   She continues to note intermitted chest pain. Now rated 3/10. She reports full medication compliance. No missed doses of ASA nor Brilinta. Unfortunately, she continues to smoke 1.5 ppd (down from 3 ppd). She is NPO.   Home Medications  Prior to Admission medications   Medication Sig Start Date End Date Taking? Authorizing Provider  albuterol (PROVENTIL HFA;VENTOLIN HFA) 108 (90  BASE) MCG/ACT inhaler Inhale 2 puffs into the lungs every 6 (six) hours as needed for wheezing or shortness of breath.   Yes Historical Provider, MD  aspirin EC 81 MG tablet Take 81 mg by mouth daily.   Yes Historical Provider, MD  atorvastatin (LIPITOR) 40 MG tablet Take 1 tablet (40 mg total) by mouth daily at 6 PM. 12/06/15  Yes Scott T Alben Spittle, PA-C  carvedilol (COREG) 3.125 MG tablet Take 3.125 mg by mouth 2 (two) times daily with a meal.   Yes Historical Provider, MD  fentaNYL (DURAGESIC - DOSED MCG/HR) 25 MCG/HR patch Place 25 mcg onto the skin every other day.   Yes Historical Provider, MD  Fluticasone-Salmeterol (ADVAIR) 100-50 MCG/DOSE AEPB Inhale 1 puff into the lungs 2 (two) times daily as needed (shortness of breath).    Yes Historical Provider, MD  insulin glargine (LANTUS) 100 unit/mL SOPN Inject 50-55 Units into the skin See admin instructions. Inject 55 units subcutaneously every morning  and 50 units at night   Yes Historical Provider, MD  lisinopril (PRINIVIL,ZESTRIL) 2.5 MG tablet Take 1 tablet (2.5 mg total) by mouth daily. 12/06/15  Yes Scott T Alben SpittleWeaver, PA-C  nitroGLYCERIN (NITROSTAT) 0.4 MG SL tablet Place 1 tablet (0.4 mg total) under the tongue every 5 (five) minutes as needed for chest pain. 12/06/15  Yes Scott Moishe Spice Weaver, PA-C  promethazine (PHENERGAN) 25 MG tablet Take 25 mg by mouth every 6 (six) hours as needed for nausea or vomiting.   Yes Historical Provider, MD  sertraline (ZOLOFT) 50 MG tablet Take 150 mg by mouth daily.    Yes Historical Provider, MD  ticagrelor (BRILINTA) 90 MG TABS tablet Take 1 tablet (90 mg total) by mouth 2 (two) times daily. 05/14/16  Yes Scott T Alben SpittleWeaver, PA-C  tiotropium (SPIRIVA) 18 MCG inhalation capsule Place 18 mcg into inhaler and inhale daily as needed (shortness of breath).    Yes Historical Provider, MD  traZODone (DESYREL) 50 MG tablet Take 50 mg by mouth at bedtime.   Yes Historical Provider, MD    Hospital Meds  . aspirin EC  81 mg Oral Daily    . atorvastatin  40 mg Oral q1800  . carvedilol  3.125 mg Oral BID WC  . enoxaparin (LOVENOX) injection  40 mg Subcutaneous Daily  . fentaNYL  25 mcg Transdermal Q48H  . insulin aspart  0-5 Units Subcutaneous QHS  . insulin aspart  0-9 Units Subcutaneous TID WC  . insulin glargine  30 Units Subcutaneous BID  . mometasone-formoterol  2 puff Inhalation BID  . sertraline  150 mg Oral Daily  . ticagrelor  90 mg Oral BID  . tiotropium  18 mcg Inhalation Daily  . traZODone  50 mg Oral QHS    Family History  Family History  Problem Relation Age of Onset  . Adopted: Yes  . Family history unknown: Yes    Social History  Social History   Social History  . Marital status: Married    Spouse name: N/A  . Number of children: N/A  . Years of education: N/A   Occupational History  . Not on file.   Social History Main Topics  . Smoking status: Current Every Day Smoker    Packs/day: 2.00    Years: 41.00    Types: Cigarettes  . Smokeless tobacco: Former NeurosurgeonUser    Types: Chew    Quit date: 09/13/2005  . Alcohol use No  . Drug use: No  . Sexual activity: Yes   Other Topics Concern  . Not on file   Social History Narrative  . No narrative on file     Review of Systems General:  No chills, fever, night sweats or weight changes.  Cardiovascular: + chest pain, no dyspnea on exertion, edema, orthopnea, palpitations, paroxysmal nocturnal dyspnea. Dermatological: No rash, lesions/masses Respiratory: No cough, dyspnea Urologic: No hematuria, dysuria Abdominal:   No nausea, vomiting, diarrhea, bright red blood per rectum, melena, or hematemesis Neurologic:  No visual changes, wkns, changes in mental status. All other systems reviewed and are otherwise negative except as noted above.  Physical Exam  Blood pressure 102/74, pulse 73, temperature 98.1 F (36.7 C), temperature source Oral, resp. rate 15, height 5\' 2"  (1.575 m), weight 190 lb 0.6 oz (86.2 kg), SpO2 97 %.  General:  Pleasant, NAD Psych: Normal affect. Neuro: Alert and oriented X 3. Moves all extremities spontaneously. HEENT: Normal  Neck: Supple without bruits or JVD. Lungs:  Resp regular and  unlabored, CTA. Heart: RRR no s3, s4, or murmurs. Abdomen: Soft, non-tender, non-distended, BS + x 4.  Extremities: No clubbing, cyanosis or edema. DP/PT/Radials 2+ and equal bilaterally.  Labs  Troponin [facility] Test)  Recent Labs  06/17/16 1935  TROPIPOC 0.01    Recent Labs  06/18/16 0147 06/18/16 0309 06/18/16 0908  TROPONINI <0.03 <0.03 <0.03   Lab Results  Component Value Date   WBC 6.8 06/17/2016   HGB 15.2 (H) 06/17/2016   HCT 42.5 06/17/2016   MCV 84.3 06/17/2016   PLT 165 06/17/2016    Recent Labs Lab 06/17/16 1930 06/17/16 2031  NA 134*  --   K 4.3  --   CL 105  --   CO2 21*  --   BUN 10  --   CREATININE 0.71 0.60  CALCIUM 8.7*  --   GLUCOSE 305*  --    Lab Results  Component Value Date   CHOL 140 01/31/2016   HDL 31 (L) 01/31/2016   LDLCALC 52 01/31/2016   TRIG 283 (H) 01/31/2016   No results found for: DDIMER   Radiology/Studies  Dg Chest 2 View  Result Date: 06/17/2016 CLINICAL DATA:  Pt c/o chest cramping and pain under left breast since 1pm today. States that she "can't breathe good" hx heart attack with stent placement in March 2017. Smoker 1.5ppd. Diabetic. EXAM: CHEST  2 VIEW COMPARISON:  Chest x-ray dated 04/21/2016. FINDINGS: Heart size and mediastinal contours are normal. Median sternotomy wires appear intact and stable alignment, for CABG. Subtle small nodular densities project over the right lower lung, possible pulmonary nodules. Lungs otherwise clear. No pleural effusion or pneumothorax seen. No acute or suspicious osseous finding. IMPRESSION: 1. Possible small pulmonary nodules within the right lower lung. Recommend chest CT for further characterization. Additionally, a previous chest CT report of 11/22/2014 recommended a 1 year follow-up CT for  additional small nodules in the left lung. 2. No evidence of pneumonia or pulmonary edema. 3. Surgical changes of previous CABG. Electronically Signed   By: Bary Richard M.D.   On: 06/17/2016 16:50   Ct Angio Chest Pe W And/or Wo Contrast  Result Date: 06/17/2016 CLINICAL DATA:  Short of breath and chest pain EXAM: CT ANGIOGRAPHY CHEST WITH CONTRAST TECHNIQUE: Multidetector CT imaging of the chest was performed using the standard protocol during bolus administration of intravenous contrast. Multiplanar CT image reconstructions and MIPs were obtained to evaluate the vascular anatomy. CONTRAST:  60 mL Isovue 370 IV COMPARISON:  Chest x-ray 06/17/2016 FINDINGS: Cardiovascular: Negative for pulmonary embolism. Pulmonary arteries normal in caliber. Negative for aortic aneurysm or dissection. Prior CABG. Heart size normal. No pericardial effusion. Mediastinum/Nodes: Small hiatal hernia. No mass or adenopathy in the mediastinum. Lungs/Pleura: Lungs are clear. No infiltrate or effusion. No lung mass identified. Upper Abdomen: Negative Musculoskeletal: Negative thoracic spine. Prior median sternotomy and CABG. Review of the MIP images confirms the above findings. IMPRESSION: Negative for pulmonary embolism.  No acute abnormality in the chest. Electronically Signed   By: Marlan Palau M.D.   On: 06/17/2016 21:42    ECG  NSR. Borderline Twave abnormalities.    ASSESSMENT AND PLAN  Principal Problem:   Chest pain Active Problems:   Back pain, chronic   COPD (chronic obstructive pulmonary disease) (HCC)   Diabetes mellitus with complication (HCC)   CAD, multiple vessel   H/O heart artery stent   Hyperlipidemia   GERD (gastroesophageal reflux disease)   Angina pectoris (HCC)   S/P  CABG x 2   Uncontrolled insulin dependent diabetes mellitus (HCC)   1. Chest Pain: similar to her prior chest pain she had in March, before her PEA arrest/MI. However she also has a h/o chronic chest pain, ever since her  CPR. No significant reproducible pain with palpation of chest wall.  Enzymes are negative x 3. She still has intermitted left sided chest pain, ~3/10. She has multiple risk factors, including IDDM and ongoing tobacco use. Recommend ischemic evaluation. Will arrange for LHC in the am. NPO at midnight.   2. CAD: s/p remote CABG and recent PEA arrest 11/2015 subsequent to occluded native LCx, requiring emergent PCI. LIMA-LAD was patent at time of last cath. Known chronic occlusion of the vein graft to PDA. Extensive collaterals from the septal perforators of the LAD to the distal RCA territory. Will likely need repeat ischemic eval as outlined above. Continue medical therapy with ASA, Brilinta, Lipitor and carvedilol.   3. Ischemic Cardiomyopathy: EF 45-50-% on echo 11/2015. Volume appears fairly stable. On BB therapy with carvedilol. Currently not on an ACE/ARB, however BP is too soft to initiate at this time.   4. Insulin Dependent Type 2 DM: per primary team. Hgb A1c pending.    5. HTN: soft but stable. Continue carvedilol.   6. HLD: on statin therapy with Lipitor, 40 mg nightly.  7. Tobacco Use:  Continue to smoke 1.5 ppm (down from 3 ppd). Smoking cessation advised   8. GERD: per primary team  9. COPD: per primary team    Signed, Robbie Lis, PA-C 06/18/2016, 11:57 AM  I have examined the patient and reviewed assessment and plan and discussed with patient.  Agree with above as stated.  Chest pain with negative troponin.  Stent about 6 months ago.  CP somewhat vague.  Plan for cath tomorrow to determine if there is any restenosis or new obstruction.  Known CTO of the RCA, left to right collaterals, but inferior wall does not appear to be moving much on the ventriculogram from 3/17.    Lance Muss

## 2016-06-18 NOTE — Care Management Note (Signed)
Case Management Note  Patient Details  Name: Sara Hobbs MRN: [id number] Date of Birth: 06-15-1961  Subjective/Objective:   Presents with chest pain, trops neg x 3, cxr with nodules in rll, cta neg for pe, still with intermittent chest pain,for LHC in am,  from home alone, pta indep, has pcp, has medication coverage and transportation at discharge.  NCM will cont to follow for dc needs.                 Action/Plan:   Expected Discharge Date:  06/19/16               Expected Discharge Plan:  Home/Self Care  In-House Referral:     Discharge planning Services  CM Consult  Post Acute Care Choice:    Choice offered to:     DME Arranged:    DME Agency:     HH Arranged:    HH Agency:     Status of Service:  In process, will continue to follow  If discussed at Long Length of Stay Meetings, dates discussed:    Additional Comments:  Leone Haven, RN 06/18/2016, 5:21 PM

## 2016-06-18 NOTE — ED Notes (Signed)
Spoke with Family practice, will look at chart and call back.

## 2016-06-18 NOTE — Progress Notes (Signed)
Family Medicine Teaching Service Daily Progress Note Intern Pager: [phone]  Patient name: Sara Hobbs Medical record number: [id number] Date of birth: Apr 01, 1961 Age: 54 y.o. Gender: female  Primary Care Provider: Lavell Islam, MD Consultants: Cardiology Code Status: Full   Pt Overview and Major Events to Date:  10/15: Troponins negative 10/16: Consult cardiology  Assessment and Plan: Sara Hobbs is a 55 y.o. female presenting with chest pain . PMH is significant for CAD status post CABG and recent stent placement, CHF, COPD, type 2 diabetes  Chest pain: Crampy pain under her left breast, preceded by acute shortness of breath. Feels similar to previous MI in March 2017. Shortness of breath resolved. CTA negative for PE. Troponins negative. EKG showed no acute changes since last study. HEART score 5. Echo 11/2014 45-50% EF.  CP still present on today's exam but improving compared to admission. - Vital signs per floor protocol - repeat EKG - Consult cardiology, appreciate recs  CAD: Status post CABG, and ACS with PEA arrest in 3/17, found to have significant stenosis in the native left circumflex treated with drug-eluting stent.  - chest pain management as above - f/u Hemoglobin A1C - continue brilinta 90mg  daily - continue coreg 3.125mg  BID - continue lisinopril 2.5mg  daily - vitals per floor protocol  Leg pain: Right calf looks bigger than left.  Patient complains of right leg pain at night. Describes "knots in calf".  Had very mild pain with palpation.  No swelling or edema.  CTA negative for PE. Well's Score for DVT -1, making this very unlikely cause of pain.  - LE Dopplers   COPD: Patient smokes 1.5 packs per day down from 3. Denies shortness of breath at this time and has clear lungs. No home O2.  Takes albuterol PRN and advair and spiriva. Chest xray showed 2 small pulmonary nodules in right lower lobe - Consider CT scan - Continue home regimen  Ischemic  cardiomyopathy Last EF 45-50% 3/17. Denies SOB, not volume overloaded on physical exam. CXR shows no signs of pulmonary edema and lungs were clear.  - continue to monitor - Continue Coreg, Brilinta, lisinopril  Type 2 diabetes: Most recent A1c was 11.8 in march. CBG upon presentation was 305. This morning 213. On 55U Lantus in AM, 50U Lantus qhs. - 25 units Lantus at night-  Increase to 30u BID - SSI - f/u CBGs with meals and night time.  - Follow up hemoglobin A1C  Hyperlipidemia: Last lipid panel 01/2016 with normal total cholesterol (140), elevated triglycerides (283), and decreased HDL (31). - continue lipitor 40mg ; consider increasing to 80mg .   Anxiety/depression: Stable - continue 150mg  zoloft  FEN/GI: heart healthy carb modified diet, NS@100mL /hr Prophylaxis: Brilinta /lovenox  Disposition: admit to telemetry/observation under attending [place]  Subjective:  Patient is laying comfortably in bed. She says she did not sleep well last night. She is still feeling a little crampy under her left breast but it is better than yesterday.  Continues to have SOB w/ exertion. She denies headache, shortness of breath, abdominal pain, chills, and fever.  Objective: Temp:  [97.9 F (36.6 C)-98.7 F (37.1 C)] 97.9 F (36.6 C) (10/16 0747) Pulse Rate:  [72-90] 78 (10/16 0747) Resp:  [11-27] 15 (10/16 0747) BP: (90-136)/(37-85) 136/68 (10/16 0747) SpO2:  [91 %-99 %] 99 % (10/16 0815) Weight:  [190 lb 0.6 oz (86.2 kg)] 190 lb 0.6 oz (86.2 kg) (10/16 0415) Physical Exam: General: 55 year old female lying in bed appearing uncomfortable Cardiovascular: Regular rate and rhythm,  S1-S2 present, no murmurs Respiratory: Normal work of breathing on RA, clear to auscultation bilaterally, no wheezing or rhonchi Gastrointestinal: Soft, nontender, nondistended MSK: Moves all extremities, right calf looks larger than left, no palpable cord or erythema Extremities: No cyanosis clubbing or  edema Neuro: Alert and oriented 3, no focal neurological deficits Psych: Appropriate mood and affect  Laboratory:  Recent Labs Lab 06/17/16 1930  WBC 6.8  HGB 15.2*  HCT 42.5  PLT 165    Recent Labs Lab 06/17/16 1930 06/17/16 2031  NA 134*  --   K 4.3  --   CL 105  --   CO2 21*  --   BUN 10  --   CREATININE 0.71 0.60  CALCIUM 8.7*  --   GLUCOSE 305*  --      Imaging/Diagnostic Tests: Dg Chest 2 View  Result Date: 06/17/2016 CLINICAL DATA:  Pt c/o chest cramping and pain under left breast since 1pm today. States that she "can't breathe good" hx heart attack with stent placement in March 2017. Smoker 1.5ppd. Diabetic. EXAM: CHEST  2 VIEW COMPARISON:  Chest x-ray dated 04/21/2016. FINDINGS: Heart size and mediastinal contours are normal. Median sternotomy wires appear intact and stable alignment, for CABG. Subtle small nodular densities project over the right lower lung, possible pulmonary nodules. Lungs otherwise clear. No pleural effusion or pneumothorax seen. No acute or suspicious osseous finding. IMPRESSION: 1. Possible small pulmonary nodules within the right lower lung. Recommend chest CT for further characterization. Additionally, a previous chest CT report of 11/22/2014 recommended a 1 year follow-up CT for additional small nodules in the left lung. 2. No evidence of pneumonia or pulmonary edema. 3. Surgical changes of previous CABG. Electronically Signed   By: Bary RichardStan  Maynard M.D.   On: 06/17/2016 16:50   Ct Angio Chest Pe W And/or Wo Contrast  Result Date: 06/17/2016 CLINICAL DATA:  Short of breath and chest pain EXAM: CT ANGIOGRAPHY CHEST WITH CONTRAST TECHNIQUE: Multidetector CT imaging of the chest was performed using the standard protocol during bolus administration of intravenous contrast. Multiplanar CT image reconstructions and MIPs were obtained to evaluate the vascular anatomy. CONTRAST:  60 mL Isovue 370 IV COMPARISON:  Chest x-ray 06/17/2016 FINDINGS:  Cardiovascular: Negative for pulmonary embolism. Pulmonary arteries normal in caliber. Negative for aortic aneurysm or dissection. Prior CABG. Heart size normal. No pericardial effusion. Mediastinum/Nodes: Small hiatal hernia. No mass or adenopathy in the mediastinum. Lungs/Pleura: Lungs are clear. No infiltrate or effusion. No lung mass identified. Upper Abdomen: Negative Musculoskeletal: Negative thoracic spine. Prior median sternotomy and CABG. Review of the MIP images confirms the above findings. IMPRESSION: Negative for pulmonary embolism.  No acute abnormality in the chest. Electronically Signed   By: Marlan Palauharles  Clark M.D.   On: 06/17/2016 21:42    Evlyn CourierSalma Mohammadi, Medical Student 06/18/2016, 8:33 AM [facility] FPT[facility] Intern pager: [phone], text pages welcome  I have separately seen and examined the patient. I have discussed the findings and exam with Student Dr Kathi LudwigMohammadi and agree with the above note.  My changes/additions are outlined in BLUE.   Llana Deshazo M. Nadine CountsGottschalk, DO PGY-3, [facility]

## 2016-06-19 ENCOUNTER — Encounter (HOSPITAL_COMMUNITY)
Admission: EM | Disposition: A | Discharge status: Home / Self Care | Payer: Self-pay | Source: Home / Self Care | Attending: Emergency Medicine

## 2016-06-19 ENCOUNTER — Encounter (HOSPITAL_COMMUNITY): Payer: Self-pay | Admitting: Cardiology

## 2016-06-19 ENCOUNTER — Observation Stay (HOSPITAL_BASED_OUTPATIENT_CLINIC_OR_DEPARTMENT_OTHER): Payer: BLUE CROSS/BLUE SHIELD

## 2016-06-19 ENCOUNTER — Encounter (HOSPITAL_COMMUNITY): Payer: BLUE CROSS/BLUE SHIELD

## 2016-06-19 DIAGNOSIS — E118 Type 2 diabetes mellitus with unspecified complications: Secondary | ICD-10-CM | POA: Diagnosis not present

## 2016-06-19 DIAGNOSIS — R0602 Shortness of breath: Secondary | ICD-10-CM | POA: Diagnosis not present

## 2016-06-19 DIAGNOSIS — I2510 Atherosclerotic heart disease of native coronary artery without angina pectoris: Secondary | ICD-10-CM | POA: Diagnosis not present

## 2016-06-19 DIAGNOSIS — J449 Chronic obstructive pulmonary disease, unspecified: Secondary | ICD-10-CM | POA: Diagnosis not present

## 2016-06-19 DIAGNOSIS — M79609 Pain in unspecified limb: Secondary | ICD-10-CM

## 2016-06-19 DIAGNOSIS — J439 Emphysema, unspecified: Secondary | ICD-10-CM | POA: Diagnosis not present

## 2016-06-19 DIAGNOSIS — R079 Chest pain, unspecified: Secondary | ICD-10-CM | POA: Diagnosis not present

## 2016-06-19 DIAGNOSIS — I110 Hypertensive heart disease with heart failure: Secondary | ICD-10-CM | POA: Diagnosis not present

## 2016-06-19 DIAGNOSIS — M549 Dorsalgia, unspecified: Secondary | ICD-10-CM | POA: Diagnosis not present

## 2016-06-19 HISTORY — PX: CARDIAC CATHETERIZATION: SHX172

## 2016-06-19 LAB — CBC
HCT: 40.5 % (ref 36.0–46.0)
HEMATOCRIT: 40.7 % (ref 36.0–46.0)
HEMOGLOBIN: 14.1 g/dL (ref 12.0–15.0)
Hemoglobin: 14.3 g/dL (ref 12.0–15.0)
MCH: 29.6 pg (ref 26.0–34.0)
MCH: 29.9 pg (ref 26.0–34.0)
MCHC: 34.6 g/dL (ref 30.0–36.0)
MCHC: 35.3 g/dL (ref 30.0–36.0)
MCV: 85.3 fL (ref 78.0–100.0)
MCV: 84.7 fL (ref 78.0–100.0)
PLATELETS: 154 10*3/uL (ref 150–400)
Platelets: 148 10*3/uL — ABNORMAL LOW (ref 150–400)
RBC: 4.77 MIL/uL (ref 3.87–5.11)
RBC: 4.78 MIL/uL (ref 3.87–5.11)
RDW: 12.5 % (ref 11.5–15.5)
RDW: 12.5 % (ref 11.5–15.5)
WBC: 7 10*3/uL (ref 4.0–10.5)
WBC: 6.3 10*3/uL (ref 4.0–10.5)

## 2016-06-19 LAB — BASIC METABOLIC PANEL
Anion gap: 7 (ref 5–15)
BUN: 6 mg/dL (ref 6–20)
CHLORIDE: 107 mmol/L (ref 101–111)
CO2: 23 mmol/L (ref 22–32)
CREATININE: 0.72 mg/dL (ref 0.44–1.00)
Calcium: 8.9 mg/dL (ref 8.9–10.3)
GFR calc Af Amer: >60 mL/min (ref >60)
GFR calc non Af Amer: >60 mL/min (ref >60)
Glucose, Bld: 182 mg/dL — ABNORMAL HIGH (ref 65–99)
POTASSIUM: 3.6 mmol/L (ref 3.5–5.1)
SODIUM: 137 mmol/L (ref 135–145)

## 2016-06-19 LAB — PROTIME-INR
INR: 1.12
PROTHROMBIN TIME: 14.5 s (ref 11.4–15.2)

## 2016-06-19 LAB — HEMOGLOBIN A1C
Hgb A1c MFr Bld: 12.5 % — ABNORMAL HIGH (ref 4.8–5.6)
Mean Plasma Glucose: 312 mg/dL

## 2016-06-19 LAB — GLUCOSE, CAPILLARY
GLUCOSE-CAPILLARY: 149 mg/dL — AB (ref 65–99)
GLUCOSE-CAPILLARY: 146 mg/dL — AB (ref 65–99)
GLUCOSE-CAPILLARY: 166 mg/dL — AB (ref 65–99)
Glucose-Capillary: 177 mg/dL — ABNORMAL HIGH (ref 65–99)

## 2016-06-19 LAB — CREATININE, SERUM
Creatinine, Ser: 0.68 mg/dL (ref 0.44–1.00)
GFR calc Af Amer: >60 mL/min (ref >60)
GFR calc non Af Amer: >60 mL/min (ref >60)

## 2016-06-19 SURGERY — LEFT HEART CATH AND CORS/GRAFTS ANGIOGRAPHY

## 2016-06-19 MED ORDER — IOPAMIDOL (ISOVUE-370) INJECTION 76%
INTRAVENOUS | Status: AC
  Filled 2016-06-19: qty 125

## 2016-06-19 MED ORDER — SODIUM CHLORIDE 0.9% FLUSH: 3.0000 mL | INTRAVENOUS | Status: DC | PRN

## 2016-06-19 MED ORDER — IOPAMIDOL (ISOVUE-370) INJECTION 76%
INTRAVENOUS | Status: DC | PRN
  Administered 2016-06-19: 85 mL via INTRA_ARTERIAL

## 2016-06-19 MED ORDER — FENTANYL CITRATE (PF) 100 MCG/2ML IJ SOLN
INTRAMUSCULAR | Status: DC | PRN
  Administered 2016-06-19 (×3): 25 ug via INTRAVENOUS

## 2016-06-19 MED ORDER — HEPARIN (PORCINE) IN NACL 2-0.9 UNIT/ML-% IJ SOLN
INTRAMUSCULAR | Status: AC
  Filled 2016-06-19: qty 1500

## 2016-06-19 MED ORDER — VERAPAMIL HCL 2.5 MG/ML IV SOLN
INTRAVENOUS | Status: AC
  Filled 2016-06-19: qty 2

## 2016-06-19 MED ORDER — MIDAZOLAM HCL 2 MG/2ML IJ SOLN
INTRAMUSCULAR | Status: AC
  Filled 2016-06-19: qty 2

## 2016-06-19 MED ORDER — SODIUM CHLORIDE 0.9 % WEIGHT BASED INFUSION
3.0000 mL/kg/h | INTRAVENOUS | Status: AC
  Administered 2016-06-19: 3 mL/kg/h via INTRAVENOUS

## 2016-06-19 MED ORDER — HEPARIN (PORCINE) IN NACL 2-0.9 UNIT/ML-% IJ SOLN
INTRAMUSCULAR | Status: DC | PRN
  Administered 2016-06-19: 1500 mL

## 2016-06-19 MED ORDER — HEPARIN SODIUM (PORCINE) 1000 UNIT/ML IJ SOLN
INTRAMUSCULAR | Status: AC
  Filled 2016-06-19: qty 1

## 2016-06-19 MED ORDER — LIDOCAINE HCL (PF) 1 % IJ SOLN
INTRAMUSCULAR | Status: DC | PRN
  Administered 2016-06-19: 2 mL via SUBCUTANEOUS

## 2016-06-19 MED ORDER — HEPARIN SODIUM (PORCINE) 1000 UNIT/ML IJ SOLN
INTRAMUSCULAR | Status: DC | PRN
  Administered 2016-06-19: 4000 [IU] via INTRAVENOUS

## 2016-06-19 MED ORDER — ALUM & MAG HYDROXIDE-SIMETH 200-200-20 MG/5ML PO SUSP
15.0000 mL | Freq: Three times a day (TID) | ORAL | Status: DC | PRN
  Administered 2016-06-19: 15 mL via ORAL
  Filled 2016-06-19: qty 30

## 2016-06-19 MED ORDER — MIDAZOLAM HCL 2 MG/2ML IJ SOLN
INTRAMUSCULAR | Status: DC | PRN
  Administered 2016-06-19 (×3): 1 mg via INTRAVENOUS

## 2016-06-19 MED ORDER — SODIUM CHLORIDE 0.9% FLUSH
3.0000 mL | Freq: Two times a day (BID) | INTRAVENOUS | Status: DC
  Administered 2016-06-20: 3 mL via INTRAVENOUS

## 2016-06-19 MED ORDER — ENOXAPARIN SODIUM 40 MG/0.4ML ~~LOC~~ SOLN
40.0000 mg | SUBCUTANEOUS | Status: DC
  Administered 2016-06-20: 40 mg via SUBCUTANEOUS
  Filled 2016-06-19: qty 0.4

## 2016-06-19 MED ORDER — FENTANYL CITRATE (PF) 100 MCG/2ML IJ SOLN
INTRAMUSCULAR | Status: AC
  Filled 2016-06-19: qty 2

## 2016-06-19 MED ORDER — LIDOCAINE HCL (PF) 1 % IJ SOLN
INTRAMUSCULAR | Status: AC
  Filled 2016-06-19: qty 30

## 2016-06-19 MED ORDER — VERAPAMIL HCL 2.5 MG/ML IV SOLN
INTRAVENOUS | Status: DC | PRN
  Administered 2016-06-19: 10 mL via INTRA_ARTERIAL

## 2016-06-19 MED ORDER — SODIUM CHLORIDE 0.9 % IV SOLN: 250.0000 mL | INTRAVENOUS | Status: DC | PRN

## 2016-06-19 SURGICAL SUPPLY — 11 items
CATH INFINITI 5 FR IM (CATHETERS) ×2 IMPLANT
CATH INFINITI 5 FR JL3.5 (CATHETERS) ×2 IMPLANT
CATH INFINITI 5FR MULTPACK ANG (CATHETERS) ×2 IMPLANT
DEVICE RAD COMP TR BAND LRG (VASCULAR PRODUCTS) ×2 IMPLANT
GLIDESHEATH SLEND SS 6F .021 (SHEATH) ×2 IMPLANT
KIT HEART LEFT (KITS) ×2 IMPLANT
PACK CARDIAC CATHETERIZATION (CUSTOM PROCEDURE TRAY) ×2 IMPLANT
SYR MEDRAD MARK V 150ML (SYRINGE) ×2 IMPLANT
TRANSDUCER W/STOPCOCK (MISCELLANEOUS) ×2 IMPLANT
TUBING CIL FLEX 10 FLL-RA (TUBING) ×2 IMPLANT
WIRE HI TORQ VERSACORE-J 145CM (WIRE) ×2 IMPLANT

## 2016-06-19 NOTE — Progress Notes (Signed)
Patient Name: Sara Hobbs Date of Encounter: 06/19/2016  [facility]  [facility] Problem [facility]     Principal Problem:   Chest pain Active Problems:   Back pain, chronic   COPD (chronic obstructive pulmonary disease) (HCC)   Diabetes mellitus with complication (HCC)   CAD, multiple vessel   H/O heart artery stent   Hyperlipidemia   GERD (gastroesophageal reflux disease)   Angina pectoris (HCC)   S/P CABG x 2   Uncontrolled insulin dependent diabetes mellitus (HCC)     Subjective   S/p cath showing patent stent, and patent LIMA to LAD.  No bleeding issues with the left wrists.    Inpatient Medications    Scheduled Meds: . aspirin EC  81 mg Oral Daily  . atorvastatin  40 mg Oral q1800  . carvedilol  3.125 mg Oral BID WC  . [START ON 06/20/2016] enoxaparin (LOVENOX) injection  40 mg Subcutaneous Q24H  . fentaNYL  25 mcg Transdermal Q48H  . insulin aspart  0-5 Units Subcutaneous QHS  . insulin aspart  0-9 Units Subcutaneous TID WC  . insulin glargine  30 Units Subcutaneous BID  . mometasone-formoterol  2 puff Inhalation BID  . sertraline  150 mg Oral Daily  . sodium chloride flush  3 mL Intravenous Q12H  . ticagrelor  90 mg Oral BID  . tiotropium  18 mcg Inhalation Daily  . traZODone  50 mg Oral QHS   Continuous Infusions:   PRN Meds: sodium chloride, acetaminophen, albuterol, alum & mag hydroxide-simeth, nitroGLYCERIN, ondansetron (ZOFRAN) IV, sodium chloride flush   Vital Signs    Vitals:   06/19/16 1626 06/19/16 1630 06/19/16 1645 06/19/16 1706  BP: 140/76 135/62 139/79 (!) 142/71  Pulse: 75 70 78 78  Resp:      Temp:      TempSrc:      SpO2:      Weight:      Height:        Intake/Output Summary (Last 24 hours) at 06/19/16 2043 Last data filed at 06/19/16 1500  Gross per 24 hour  Intake            60.34 ml  Output                0 ml  Net            60.34 ml   Filed Weights   06/18/16 0415  Weight: 86.2 kg (190 lb 0.6 oz)     Physical Exam    GEN: Well nourished, well developed, in no acute distress.  HEENT: Grossly normal.  Neck: Supple, no JVD, carotid bruits, or masses. Cardiac: RRR, no murmurs, rubs, or gallops. No clubbing, cyanosis, edema.  Radials/DP/PT 2+ and equal bilaterally.  Respiratory:  Respirations regular and unlabored, clear to auscultation bilaterally. GI: Soft, nontender, nondistended, BS + x 4. MS: no deformity or atrophy. Skin: warm and dry, no rash. Neuro:  Strength and sensation are intact. Psych: AAOx3.  Normal affect. No left wrist hematoma  Labs    CBC  Recent Labs  06/19/16 0253 06/19/16 1434  WBC 7.0 6.3  HGB 14.1 14.3  HCT 40.7 40.5  MCV 85.3 84.7  PLT 148* 154   Basic Metabolic Panel  Recent Labs  06/17/16 1930  06/19/16 0253 06/19/16 1434  NA 134*  --  137  --   K 4.3  --  3.6  --   CL 105  --  107  --   CO2  21*  --  23  --   GLUCOSE 305*  --  182*  --   BUN 10  --  6  --   CREATININE 0.71  < > 0.72 0.68  CALCIUM 8.7*  --  8.9  --   < > = values in this interval not displayed. Liver Function Tests No results for input(s): AST, ALT, ALKPHOS, BILITOT, PROT, ALBUMIN in the last 72 hours.  Recent Labs  06/17/16 1930  LIPASE 16   Cardiac Enzymes  Recent Labs  06/18/16 0147 06/18/16 0309 06/18/16 0908  TROPONINI <0.03 <0.03 <0.03   BNP Invalid input(s): POCBNP D-Dimer No results for input(s): DDIMER in the last 72 hours. Hemoglobin A1C  Recent Labs  06/18/16 0040  HGBA1C 12.5*   Fasting Lipid Panel No results for input(s): CHOL, HDL, LDLCALC, TRIG, CHOLHDL, LDLDIRECT in the last 72 hours. Thyroid Function Tests No results for input(s): TSH, T4TOTAL, T3FREE, THYROIDAB in the last 72 hours.  Invalid input(s): FREET3  Telemetry    NSR - Personally Reviewed  ECG    NSR, ant T wave changes - Personally Reviewed  Radiology    Ct Angio Chest Pe W And/or Wo Contrast  Result Date: 06/17/2016 CLINICAL DATA:  Short of breath  and chest pain EXAM: CT ANGIOGRAPHY CHEST WITH CONTRAST TECHNIQUE: Multidetector CT imaging of the chest was performed using the standard protocol during bolus administration of intravenous contrast. Multiplanar CT image reconstructions and MIPs were obtained to evaluate the vascular anatomy. CONTRAST:  60 mL Isovue 370 IV COMPARISON:  Chest x-ray 06/17/2016 FINDINGS: Cardiovascular: Negative for pulmonary embolism. Pulmonary arteries normal in caliber. Negative for aortic aneurysm or dissection. Prior CABG. Heart size normal. No pericardial effusion. Mediastinum/Nodes: Small hiatal hernia. No mass or adenopathy in the mediastinum. Lungs/Pleura: Lungs are clear. No infiltrate or effusion. No lung mass identified. Upper Abdomen: Negative Musculoskeletal: Negative thoracic spine. Prior median sternotomy and CABG. Review of the MIP images confirms the above findings. IMPRESSION: Negative for pulmonary embolism.  No acute abnormality in the chest. Electronically Signed   By: Marlan Palau M.D.   On: 06/17/2016 21:42    Cardiac Studies   caht results as above including CTO of RCA  Patient Profile     55 y/o with CAD  Assessment & Plan    1) CAD: No new CAD.  Continue current meds.  No further cardiac testing at this time. Medical therapy for decreased LVEF.    DM poorly controlled. She needs more intense control given elevated HbA1C.  Signed, Lance Muss, MD  06/19/2016, 8:43 PM

## 2016-06-19 NOTE — Interval H&P Note (Signed)
History and Physical Interval Note:  06/19/2016 1:01 PM  Mili Claytor  has presented today for surgery, with the diagnosis of unstable angina  The various methods of treatment have been discussed with the patient and family. After consideration of risks, benefits and other options for treatment, the patient has consented to  Procedure(s): Left Heart Cath and Cors/Grafts Angiography (N/A) as a surgical intervention .  The patient's history has been reviewed, patient examined, no change in status, stable for surgery.  I have reviewed the patient's chart and labs.  Questions were answered to the patient's satisfaction.   Cath Lab Visit (complete for each Cath Lab visit)  Clinical Evaluation Leading to the Procedure:   ACS: Yes.    Non-ACS:    Anginal Classification: CCS IV  Anti-ischemic medical therapy: Minimal Therapy (1 class of medications)  Non-Invasive Test Results: No non-invasive testing performed  Prior CABG: Previous CABG        Markies Mowatt [place] MD,FACC 06/19/2016 1:01 PM

## 2016-06-19 NOTE — Progress Notes (Signed)
Family Medicine Teaching Service Daily Progress Note Intern Pager: [phone]  Patient name: Sara Hobbs Medical record number: [id number] Date of birth: 1960/09/18 Age: 55 y.o. Gender: female  Primary Care Provider: Lavell Islam, MD Consultants: Cardiology Code Status: Full  Pt Overview and Major Events to Date:  10/15: Troponins negative 10/16: Consult cardiology 10/17: LE doppler, LHC  Assessment and Plan: Sara Hobbs a 55 y.o.femalepresenting with chest pain. PMH is significant for CAD status post CABG and recent stent placement, CHF, COPD, type 2 diabetes  Chest pain: CTA negative for PE. Troponins negative. Echo 11/2014 45-50% EF. S/p cardiac cath that showed Severe 2 vessel obstructive CAD. Patent stents in the proximal to mid LCx Patent LIMA to the LAD. Occluded SVG to RCA. This is chronic. Mild LV dysfunction. Normal LV EDP. Potential areas of ischemia include the chronic occlusion of the RCA and the first diagonal. These findings are unchanged from prior cardiac cath so, per cardiology, are unlikely to explain her acute chest pain symptoms. Recommend continued medical management - Vital signs per floor protocol - c/s cards, appreciate recs - continue meds as below, see CAD  CAD: Status post CABG, and ACS with PEAarrest in 3/17, found to have significant stenosis in the native left circumflex treated with drug-eluting stent.  - continue brilinta 90mg  daily - continue coreg 3.125mg  BID after LHC - continue lisinopril 2.5mg  daily - vitals per floor protocol  Leg pain: Right calf looks bigger than left.  Patient complains of right leg pain at night. Describes "knots in calf". Had very mild pain with palpation. No swelling or edema. CTA negative for PE. Well's Score for DVT -1, making this very unlikely cause of pain. LE dopplers prelim read NEGATIVE for DVT.  COPD:Patient smokes 1.5 packs per day down from 3. Denies shortness of breath at this time and has  clear lungs. No home O2. Takes albuterol PRN and advair and spiriva. Chest xray showed 2 small pulmonary nodules in right lower lobe - Consider CT scan - Continue home regimen  Ischemic cardiomyopathyLast EF 45-50% 3/17. Denies SOB, not volume overloaded on physical exam. CXR shows no signs of pulmonary edema and lungs were clear.  - continue to monitor - Continue Coreg, Brilinta, lisinopril after LHC  Type 2 diabetes: A1c 12.5. CBG upon presentation was 305. This morning 213. On 55U Lantus in AM, 50U Lantus qhs. - Lantus 30u BID - SSI - f/u CBGs with meals and night time.   Hyperlipidemia: Last lipid panel 01/2016 with normal total cholesterol (140), elevated triglycerides (283), and decreased HDL (31). - continue lipitor 40mg ; consider increasing to 80mg .   Anxiety/depression: Stable - continue 150mg  zoloft  FEN/GI: heart healthy carb modified diet, NS@100mL /hr Prophylaxis: Brilinta /lovenox  Disposition: Telemetry  Subjective:  Patient is laying comfortably in bed. She says she did not sleep well last night. She was still feeling crampy under her left breast overnight which she rated a 5/10, but none currently. She says she had shortness of breath intermittently as well overnight, but none currently. Also complains of headache. She denies abdominal pain, chills, and fever.  Update: Patient evaluated and reports resolution of CP, SOB.  Endorses drowsiness s/p cath.    Objective: Temp:  [98.1 F (36.7 C)-98.5 F (36.9 C)] 98.1 F (36.7 C) (10/17 0537) Pulse Rate:  [70-76] 70 (10/17 0537) Resp:  [15-16] 16 (10/17 0537) BP: (102-143)/(54-74) 129/57 (10/17 0537) SpO2:  [96 %-99 %] 98 % (10/17 0537) Physical Exam: General: 56 year old female lying in bed  appearing uncomfortable Cardiovascular: Regular rate and rhythm, S1-S2 present, no murmurs, some tenderness under left breast-says ribs were broken when they did CPR in March Respiratory: Normal work of breathing on  RA,clear to auscultation bilaterally, no wheezing or rhonchi Gastrointestinal: Soft, nontender, nondistended MSK: Moves all extremities, right calf looks larger than left, no palpable cord or erythema Extremities: No cyanosis clubbing or edema Neuro: Alert and oriented 3, no focal neurological deficits Psych: Appropriatemood and affect Laboratory:  Recent Labs Lab 06/17/16 1930 06/19/16 0253  WBC 6.8 7.0  HGB 15.2* 14.1  HCT 42.5 40.7  PLT 165 148*    Recent Labs Lab 06/17/16 1930 06/17/16 2031 06/19/16 0253  NA 134*  --  137  K 4.3  --  3.6  CL 105  --  107  CO2 21*  --  23  BUN 10  --  6  CREATININE 0.71 0.60 0.72  CALCIUM 8.7*  --  8.9  GLUCOSE 305*  --  182*    Imaging/Diagnostic Tests: Dg Chest 2 View  Result Date: 06/17/2016 CLINICAL DATA:  Pt c/o chest cramping and pain under left breast since 1pm today. States that she "can't breathe good" hx heart attack with stent placement in March 2017. Smoker 1.5ppd. Diabetic. EXAM: CHEST  2 VIEW COMPARISON:  Chest x-ray dated 04/21/2016. FINDINGS: Heart size and mediastinal contours are normal. Median sternotomy wires appear intact and stable alignment, for CABG. Subtle small nodular densities project over the right lower lung, possible pulmonary nodules. Lungs otherwise clear. No pleural effusion or pneumothorax seen. No acute or suspicious osseous finding. IMPRESSION: 1. Possible small pulmonary nodules within the right lower lung. Recommend chest CT for further characterization. Additionally, a previous chest CT report of 11/22/2014 recommended a 1 year follow-up CT for additional small nodules in the left lung. 2. No evidence of pneumonia or pulmonary edema. 3. Surgical changes of previous CABG. Electronically Signed   By: Bary RichardStan  Maynard M.D.   On: 06/17/2016 16:50   Ct Angio Chest Pe W And/or Wo Contrast  Result Date: 06/17/2016 CLINICAL DATA:  Short of breath and chest pain EXAM: CT ANGIOGRAPHY CHEST WITH CONTRAST  TECHNIQUE: Multidetector CT imaging of the chest was performed using the standard protocol during bolus administration of intravenous contrast. Multiplanar CT image reconstructions and MIPs were obtained to evaluate the vascular anatomy. CONTRAST:  60 mL Isovue 370 IV COMPARISON:  Chest x-ray 06/17/2016 FINDINGS: Cardiovascular: Negative for pulmonary embolism. Pulmonary arteries normal in caliber. Negative for aortic aneurysm or dissection. Prior CABG. Heart size normal. No pericardial effusion. Mediastinum/Nodes: Small hiatal hernia. No mass or adenopathy in the mediastinum. Lungs/Pleura: Lungs are clear. No infiltrate or effusion. No lung mass identified. Upper Abdomen: Negative Musculoskeletal: Negative thoracic spine. Prior median sternotomy and CABG. Review of the MIP images confirms the above findings. IMPRESSION: Negative for pulmonary embolism.  No acute abnormality in the chest. Electronically Signed   By: Marlan Palauharles  Clark M.D.   On: 06/17/2016 21:42    Evlyn CourierSalma Mohammadi, Medical Student 06/19/2016, 8:13 AM [facility] FPTS Intern pager: [phone], text pages welcome  I have separately seen and examined the patient. I have discussed the findings and exam with Student Dr Kathi LudwigMohammadi and agree with the above note.  My changes/additions are outlined in BLUE.   Blood pressure 124/64, pulse 72, temperature 98.7 F (37.1 C), temperature source Oral, resp. rate 16, height 5\' 2"  (1.575 m), weight 190 lb 0.6 oz (86.2 kg), SpO2 97 %. Gen: drowsy, NAD Cardio: regular  rate Pulm: normal WOB  Ext: WWP, no edema, R calf is slightly larger than L, nontender, no palpable cords, no erythema  Changes in assessment/ plan as above.  Gen Clagg M. Nadine Counts, DO PGY-3, [facility]

## 2016-06-19 NOTE — Progress Notes (Signed)
Preliminary results by tech: Bilateral lower extremity duplex complete. Bilateral lower extremities negative for DVT, Right lower extremity, area of pain, negative for SVT. No bakers cysts seen bilaterally. Levin Bacon- RDMS/RVT

## 2016-06-19 NOTE — H&P (View-Only) (Signed)
Patient ID: Sara Hobbs MRN: [id number], DOB/AGE: August 20, 1961   Admit date: 06/17/2016   Reason for Consult: Chest Pain Requesting MD: Dr. Randolm Idol, Family Medicine   Primary Physician: Lavell Islam, MD Primary Cardiologist: Dr. Excell Seltzer   Pt. Profile:  55 y/o female with h/o CAD with remote CABG, recent PEA arrest 11/2015 subsequent to occluded native LCx requiring emergent PCI, ischemic cardiomyopathy with EF of 45-50% on echo 11/2015, IDT2DM, HTN, HLD, tobacco use and COPD, admitted for acute CP evaluation.   Problem List  Past Medical History:  Diagnosis Date  . Angina pectoris (HCC)   . Anxiety   . Arthritis    "hands, neck, back, knees, hips, ankles" (11/22/2014)  . Asthma   . CAD (coronary artery disease)    a. 09/2014: LIMA to LAD, SVG to PDA, EVH via right thigh b. Cardiac Arrest 11/2015: patent LIMA-LAD, CTO if SVG-PDA, 99% stenosis of Prox-Mid Cx w/ DES placed  . CAD, multiple vessel   . Cardiac arrest (HCC) 11/25/2015  . Chest tightness   . CHF (congestive heart failure) (HCC)   . Chronic lower back pain   . Chronic shoulder pain    "right; I've got bad rotator cuff"  . COPD (chronic obstructive pulmonary disease) (HCC)   . Depression   . Diabetes mellitus with complication (HCC)   . GERD (gastroesophageal reflux disease)   . Headache    "monthly" (11/22/2014)  . History of blood transfusion    "when I had my 1st youngun; when I had MI"  . History of hiatal hernia   . History of stomach ulcers   . Hyperlipidemia   . Hypothyroidism    PMH:  . IDDM (insulin dependent diabetes mellitus) (HCC)   . Incidental lung nodule, > 3mm and < 8mm 11/22/2014   Small non-calcified nodules left lung  . Myocardial infarction 11/2013   PCI w/ drug eluting stent LCx  . Neuropathy due to secondary diabetes (HCC)   . Wears glasses     Past Surgical History:  Procedure Laterality Date  . ABDOMINAL HYSTERECTOMY  1990's  . APPLICATION OF WOUND VAC N/A 11/23/2014   Procedure: POSSIBLE APPLICATION OF WOUND VAC;  Surgeon: Purcell Nails, MD;  Location: [facility] OR;  Service: Thoracic;  Laterality: N/A;  . BREAST SURGERY Left 1990's   breast duct surgery  . CARDIAC CATHETERIZATION  09/01/14  . CARDIAC CATHETERIZATION N/A 11/25/2015   Procedure: Left Heart Cath and Cors/Grafts Angiography;  Surgeon: Tonny Bollman, MD;  Location: [facility] INVASIVE CV LAB;  Service: Cardiovascular;  Laterality: N/A;  . CARDIAC CATHETERIZATION N/A 11/25/2015   Procedure: Coronary Stent Intervention;  Surgeon: Tonny Bollman, MD;  Location: [facility] INVASIVE CV LAB;  Service: Cardiovascular;  Laterality: N/A;  . CESAREAN SECTION  1982; 1984  . COLONOSCOPY    . CORONARY ANGIOPLASTY WITH STENT PLACEMENT  11/11/2013   Drug-eluting stent in LCx  . CORONARY ARTERY BYPASS GRAFT N/A 09/22/2014   Procedure: CORONARY ARTERY BYPASS GRAFTING (CABG);  Surgeon: Purcell Nails, MD;  Location: [facility] OR;  Service: Open Heart Surgery;  Laterality: N/A;  Times 2 using left internal mammary artery and endoscopically harvested right saphenous vein  . ESOPHAGOGASTRODUODENOSCOPY    . GASTRIC RESECTION  1990's   bezoar; "for reflux"  . LAPAROSCOPIC CHOLECYSTECTOMY  2000  . STERNAL WOUND DEBRIDEMENT N/A 11/23/2014   Procedure: SUPERFICIAL STERNAL WOUND DEBRIDEMENT;  Surgeon: Purcell Nails, MD;  Location: [facility] OR;  Service: Thoracic;  Laterality: N/A;  . TEE WITHOUT CARDIOVERSION  N/A 09/22/2014   Procedure: TRANSESOPHAGEAL ECHOCARDIOGRAM (TEE);  Surgeon: Purcell Nails, MD;  Location: [facility] OR;  Service: Open Heart Surgery;  Laterality: N/A;     Allergies  Allergies  Allergen Reactions  . Wellbutrin [Bupropion] Other (See Comments) and Palpitations    "Makes me feel like im having a heart attack" SEVERE CHEST PAIN    HPI  The patient is a 55 y/o female, followed by Dr. Excell Seltzer, who has a complex medical history with coronary artery disease and remote CABG, COPD, insulin dependent type 2 diabetes, hypertension,  hyperlipidemia, tobacco use and chronic pain syndrome. She presented in the spring, on 11/25/2015, with chest pain, shortness of breath and PEA arrest in the emergency room. She was resuscitated and underwent emergency cardiac catheterization showing three-vessel coronary artery disease with severe stenosis of the proximal left circumflex. She had patency of the LIMA to LAD graft and chronic occlusion of the vein graft to PDA. She underwent PCI of the native left circumflex without complication. She's had a lot of musculoskeletal pain after CPR. Her last echo 3/17 showed EF of 45-50%.  She presented to [facility] on 06/18/15 with a complaint of acute chest pain. She was in her usual state of health until 1 PM yesterday, when she developed sudden onset left sided chest pain occurring at rest. Pain is underneath her left breast. She was sitting down reading a book. Described as a "cramping" pain. Does not radiate. Somewhat worse with exertion. Not pleuritic. Similar to her CP she had in March prior to her PEA arrest. Pain was 5/10 when she called EMS. Pain eased off after 4 baby ASA and SL NTG en route to hospital.    In ED, EKG showed NSR w/o acute changes. CXR showed showed possible small pulmonary nodules in right lower lung, no acute PNA/Pulmonary Edema. CTA negative for PE, no nodules seen on CT scan.  Negative for aortic aneurysm or dissection.  Lungs are clear. No infiltrate or effusion. No lung mass identified.  She was admitted by family medicine. Cardiac enzymes have been cycled x 3 and are negative, ruling her out for MI.   She continues to note intermitted chest pain. Now rated 3/10. She reports full medication compliance. No missed doses of ASA nor Brilinta. Unfortunately, she continues to smoke 1.5 ppd (down from 3 ppd). She is NPO.   Home Medications  Prior to Admission medications   Medication Sig Start Date End Date Taking? Authorizing Provider  albuterol (PROVENTIL HFA;VENTOLIN HFA) 108 (90  BASE) MCG/ACT inhaler Inhale 2 puffs into the lungs every 6 (six) hours as needed for wheezing or shortness of breath.   Yes Historical Provider, MD  aspirin EC 81 MG tablet Take 81 mg by mouth daily.   Yes Historical Provider, MD  atorvastatin (LIPITOR) 40 MG tablet Take 1 tablet (40 mg total) by mouth daily at 6 PM. 12/06/15  Yes Scott T Alben Spittle, PA-C  carvedilol (COREG) 3.125 MG tablet Take 3.125 mg by mouth 2 (two) times daily with a meal.   Yes Historical Provider, MD  fentaNYL (DURAGESIC - DOSED MCG/HR) 25 MCG/HR patch Place 25 mcg onto the skin every other day.   Yes Historical Provider, MD  Fluticasone-Salmeterol (ADVAIR) 100-50 MCG/DOSE AEPB Inhale 1 puff into the lungs 2 (two) times daily as needed (shortness of breath).    Yes Historical Provider, MD  insulin glargine (LANTUS) 100 unit/mL SOPN Inject 50-55 Units into the skin See admin instructions. Inject 55 units subcutaneously every morning  and 50 units at night   Yes Historical Provider, MD  lisinopril (PRINIVIL,ZESTRIL) 2.5 MG tablet Take 1 tablet (2.5 mg total) by mouth daily. 12/06/15  Yes Scott T Alben SpittleWeaver, PA-C  nitroGLYCERIN (NITROSTAT) 0.4 MG SL tablet Place 1 tablet (0.4 mg total) under the tongue every 5 (five) minutes as needed for chest pain. 12/06/15  Yes Scott Moishe Spice Weaver, PA-C  promethazine (PHENERGAN) 25 MG tablet Take 25 mg by mouth every 6 (six) hours as needed for nausea or vomiting.   Yes Historical Provider, MD  sertraline (ZOLOFT) 50 MG tablet Take 150 mg by mouth daily.    Yes Historical Provider, MD  ticagrelor (BRILINTA) 90 MG TABS tablet Take 1 tablet (90 mg total) by mouth 2 (two) times daily. 05/14/16  Yes Scott T Alben SpittleWeaver, PA-C  tiotropium (SPIRIVA) 18 MCG inhalation capsule Place 18 mcg into inhaler and inhale daily as needed (shortness of breath).    Yes Historical Provider, MD  traZODone (DESYREL) 50 MG tablet Take 50 mg by mouth at bedtime.   Yes Historical Provider, MD    Hospital Meds  . aspirin EC  81 mg Oral Daily    . atorvastatin  40 mg Oral q1800  . carvedilol  3.125 mg Oral BID WC  . enoxaparin (LOVENOX) injection  40 mg Subcutaneous Daily  . fentaNYL  25 mcg Transdermal Q48H  . insulin aspart  0-5 Units Subcutaneous QHS  . insulin aspart  0-9 Units Subcutaneous TID WC  . insulin glargine  30 Units Subcutaneous BID  . mometasone-formoterol  2 puff Inhalation BID  . sertraline  150 mg Oral Daily  . ticagrelor  90 mg Oral BID  . tiotropium  18 mcg Inhalation Daily  . traZODone  50 mg Oral QHS    Family History  Family History  Problem Relation Age of Onset  . Adopted: Yes  . Family history unknown: Yes    Social History  Social History   Social History  . Marital status: Married    Spouse name: N/A  . Number of children: N/A  . Years of education: N/A   Occupational History  . Not on file.   Social History Main Topics  . Smoking status: Current Every Day Smoker    Packs/day: 2.00    Years: 41.00    Types: Cigarettes  . Smokeless tobacco: Former NeurosurgeonUser    Types: Chew    Quit date: 09/13/2005  . Alcohol use No  . Drug use: No  . Sexual activity: Yes   Other Topics Concern  . Not on file   Social History Narrative  . No narrative on file     Review of Systems General:  No chills, fever, night sweats or weight changes.  Cardiovascular: + chest pain, no dyspnea on exertion, edema, orthopnea, palpitations, paroxysmal nocturnal dyspnea. Dermatological: No rash, lesions/masses Respiratory: No cough, dyspnea Urologic: No hematuria, dysuria Abdominal:   No nausea, vomiting, diarrhea, bright red blood per rectum, melena, or hematemesis Neurologic:  No visual changes, wkns, changes in mental status. All other systems reviewed and are otherwise negative except as noted above.  Physical Exam  Blood pressure 102/74, pulse 73, temperature 98.1 F (36.7 C), temperature source Oral, resp. rate 15, height 5\' 2"  (1.575 m), weight 190 lb 0.6 oz (86.2 kg), SpO2 97 %.  General:  Pleasant, NAD Psych: Normal affect. Neuro: Alert and oriented X 3. Moves all extremities spontaneously. HEENT: Normal  Neck: Supple without bruits or JVD. Lungs:  Resp regular and  unlabored, CTA. Heart: RRR no s3, s4, or murmurs. Abdomen: Soft, non-tender, non-distended, BS + x 4.  Extremities: No clubbing, cyanosis or edema. DP/PT/Radials 2+ and equal bilaterally.  Labs  Troponin [facility] Test)  Recent Labs  06/17/16 1935  TROPIPOC 0.01    Recent Labs  06/18/16 0147 06/18/16 0309 06/18/16 0908  TROPONINI <0.03 <0.03 <0.03   Lab Results  Component Value Date   WBC 6.8 06/17/2016   HGB 15.2 (H) 06/17/2016   HCT 42.5 06/17/2016   MCV 84.3 06/17/2016   PLT 165 06/17/2016    Recent Labs Lab 06/17/16 1930 06/17/16 2031  NA 134*  --   K 4.3  --   CL 105  --   CO2 21*  --   BUN 10  --   CREATININE 0.71 0.60  CALCIUM 8.7*  --   GLUCOSE 305*  --    Lab Results  Component Value Date   CHOL 140 01/31/2016   HDL 31 (L) 01/31/2016   LDLCALC 52 01/31/2016   TRIG 283 (H) 01/31/2016   No results found for: DDIMER   Radiology/Studies  Dg Chest 2 View  Result Date: 06/17/2016 CLINICAL DATA:  Pt c/o chest cramping and pain under left breast since 1pm today. States that she "can't breathe good" hx heart attack with stent placement in March 2017. Smoker 1.5ppd. Diabetic. EXAM: CHEST  2 VIEW COMPARISON:  Chest x-ray dated 04/21/2016. FINDINGS: Heart size and mediastinal contours are normal. Median sternotomy wires appear intact and stable alignment, for CABG. Subtle small nodular densities project over the right lower lung, possible pulmonary nodules. Lungs otherwise clear. No pleural effusion or pneumothorax seen. No acute or suspicious osseous finding. IMPRESSION: 1. Possible small pulmonary nodules within the right lower lung. Recommend chest CT for further characterization. Additionally, a previous chest CT report of 11/22/2014 recommended a 1 year follow-up CT for  additional small nodules in the left lung. 2. No evidence of pneumonia or pulmonary edema. 3. Surgical changes of previous CABG. Electronically Signed   By: Bary Richard M.D.   On: 06/17/2016 16:50   Ct Angio Chest Pe W And/or Wo Contrast  Result Date: 06/17/2016 CLINICAL DATA:  Short of breath and chest pain EXAM: CT ANGIOGRAPHY CHEST WITH CONTRAST TECHNIQUE: Multidetector CT imaging of the chest was performed using the standard protocol during bolus administration of intravenous contrast. Multiplanar CT image reconstructions and MIPs were obtained to evaluate the vascular anatomy. CONTRAST:  60 mL Isovue 370 IV COMPARISON:  Chest x-ray 06/17/2016 FINDINGS: Cardiovascular: Negative for pulmonary embolism. Pulmonary arteries normal in caliber. Negative for aortic aneurysm or dissection. Prior CABG. Heart size normal. No pericardial effusion. Mediastinum/Nodes: Small hiatal hernia. No mass or adenopathy in the mediastinum. Lungs/Pleura: Lungs are clear. No infiltrate or effusion. No lung mass identified. Upper Abdomen: Negative Musculoskeletal: Negative thoracic spine. Prior median sternotomy and CABG. Review of the MIP images confirms the above findings. IMPRESSION: Negative for pulmonary embolism.  No acute abnormality in the chest. Electronically Signed   By: Marlan Palau M.D.   On: 06/17/2016 21:42    ECG  NSR. Borderline Twave abnormalities.    ASSESSMENT AND PLAN  Principal Problem:   Chest pain Active Problems:   Back pain, chronic   COPD (chronic obstructive pulmonary disease) (HCC)   Diabetes mellitus with complication (HCC)   CAD, multiple vessel   H/O heart artery stent   Hyperlipidemia   GERD (gastroesophageal reflux disease)   Angina pectoris (HCC)   S/P  CABG x 2   Uncontrolled insulin dependent diabetes mellitus (HCC)   1. Chest Pain: similar to her prior chest pain she had in March, before her PEA arrest/MI. However she also has a h/o chronic chest pain, ever since her  CPR. No significant reproducible pain with palpation of chest wall.  Enzymes are negative x 3. She still has intermitted left sided chest pain, ~3/10. She has multiple risk factors, including IDDM and ongoing tobacco use. Recommend ischemic evaluation. Will arrange for LHC in the am. NPO at midnight.   2. CAD: s/p remote CABG and recent PEA arrest 11/2015 subsequent to occluded native LCx, requiring emergent PCI. LIMA-LAD was patent at time of last cath. Known chronic occlusion of the vein graft to PDA. Extensive collaterals from the septal perforators of the LAD to the distal RCA territory. Will likely need repeat ischemic eval as outlined above. Continue medical therapy with ASA, Brilinta, Lipitor and carvedilol.   3. Ischemic Cardiomyopathy: EF 45-50-% on echo 11/2015. Volume appears fairly stable. On BB therapy with carvedilol. Currently not on an ACE/ARB, however BP is too soft to initiate at this time.   4. Insulin Dependent Type 2 DM: per primary team. Hgb A1c pending.    5. HTN: soft but stable. Continue carvedilol.   6. HLD: on statin therapy with Lipitor, 40 mg nightly.  7. Tobacco Use:  Continue to smoke 1.5 ppm (down from 3 ppd). Smoking cessation advised   8. GERD: per primary team  9. COPD: per primary team    Signed, Robbie Lis, PA-C 06/18/2016, 11:57 AM  I have examined the patient and reviewed assessment and plan and discussed with patient.  Agree with above as stated.  Chest pain with negative troponin.  Stent about 6 months ago.  CP somewhat vague.  Plan for cath tomorrow to determine if there is any restenosis or new obstruction.  Known CTO of the RCA, left to right collaterals, but inferior wall does not appear to be moving much on the ventriculogram from 3/17.    Lance Muss

## 2016-06-20 DIAGNOSIS — I209 Angina pectoris, unspecified: Secondary | ICD-10-CM | POA: Diagnosis not present

## 2016-06-20 DIAGNOSIS — J439 Emphysema, unspecified: Secondary | ICD-10-CM | POA: Diagnosis not present

## 2016-06-20 DIAGNOSIS — M549 Dorsalgia, unspecified: Secondary | ICD-10-CM | POA: Diagnosis not present

## 2016-06-20 DIAGNOSIS — I2510 Atherosclerotic heart disease of native coronary artery without angina pectoris: Secondary | ICD-10-CM | POA: Diagnosis not present

## 2016-06-20 LAB — GLUCOSE, CAPILLARY
Glucose-Capillary: 107 mg/dL — ABNORMAL HIGH (ref 65–99)
Glucose-Capillary: 179 mg/dL — ABNORMAL HIGH (ref 65–99)

## 2016-06-20 NOTE — Progress Notes (Signed)
Patient to D/C home with spouse. IV removed. Tele monitor removed. CCMD notified. D/C education done. Patient no further questions. Personal belongings given to patient. Patient D/C home with spouse.   Valinda Hoar RN

## 2016-06-20 NOTE — Discharge Summary (Signed)
Family Medicine Teaching [facility] Discharge Summary  Patient name: Sara Hobbs Medical record number: [id number] Date of birth: [birth year] Age: 55 y.o. Gender: female Date of Admission: 06/17/2016  Date of Discharge: 06/20/2016 Admitting Physician: Uvaldo RisingKyle J Fletke, MD  Primary Care Provider: Lavell IslamLOWARD,DAVIS L, MD Consultants: Cardiology  Indication for Hospitalization: Chest pain  Discharge Diagnoses/Problem List:  Chest pain COPD Diabetes mellitus with complication CAD, multiple vessel H/O heart artery stent Hyperlipidemia GERD (gastroesophageal reflux disease) Unstable Angina S/P CABG x 2 Uncontrolled insulin dependent diabetes mellitus  Disposition: Home  Discharge Condition: Stable  Discharge Exam:  General: 55 year old female sitting up in bed Cardiovascular: Regular rate and rhythm, S1-S2 present, no murmurs, some tenderness under left breast-says ribs were broken when they did CPR in March Respiratory: Normal work of breathing on RA,clear to auscultation bilaterally, no wheezing or rhonchi Gastrointestinal: Soft, nontender, nondistended MSK: Moves all extremities, right calf slight larger than left, no palpable cord or erythema Extremities: No cyanosis clubbing or edema Neuro: Alert and oriented 3, no focal neurological deficits Psych: Appropriatemood and affect  Brief Hospital Course:  Sara DearLoretta Baileyis a 55 y.o.femalepresenting with chest pain. PMH is significant for CAD status post CABG and recent stent placement, CHF, COPD, type 2 diabetes. Refer to H&P for more details.  Chest pain:  Patient presented with chest pain under left breast preceded by shortness of breath. Patient described the pain as crampy and similar to the MI she had in March.CTA negative for PE. Troponins werenegative and EKG showed no acute changes. Cardiology was consulted due to classic chest pain presentation and significant CAD history. Cardiac cath was performed and findings  were unchanged from prior cardiac cath. Continued medical management was recommended. Home ASA, Brilinta, Lipitor, Carvedilol were continued. Upon discharge, patient denied any chest pain symptoms and it was suspected this may have been musculoskeletal pain.   Leg pain:  Patient complained of right leg pain at night. Describes "knots in calf".  Right calf slightly larger than left. LE dopplers NEGATIVE for DVT. CTA performed and was negative for PE. Unclear etiology  Type 2 diabetes:  She was started on 25u Lantus BID in the hospital but was increased to Lantus 30u BID due to high CBGs.   All other chronic medical conditions stable throughout admission and managed with home regimens.   Issues for Follow Up:  1. Diabetes; discharged on 30 units of Lantus BID (home dose is 55U in AM and 50 U qhs), may need to adjust based on CBGs. 2. CT scan for RLL pulmonary nodules and small nodules in the left lung noted on CXR 3. Continue medical therapy with ASA, Brilinta, Lipitor, Carvedilol  4. Recommend continue smoking cessation counseling 5. Consider increasing Lipitor to 80mg   Significant Procedures: LHC and LE doppler U/S  Significant Labs and Imaging:   Recent Labs Lab 06/17/16 1930 06/19/16 0253 06/19/16 1434  WBC 6.8 7.0 6.3  HGB 15.2* 14.1 14.3  HCT 42.5 40.7 40.5  PLT 165 148* 154    Recent Labs Lab 06/17/16 1930 06/17/16 2031 06/19/16 0253 06/19/16 1434  NA 134*  --  137  --   K 4.3  --  3.6  --   CL 105  --  107  --   CO2 21*  --  23  --   GLUCOSE 305*  --  182*  --   BUN 10  --  6  --   CREATININE 0.71 0.60 0.72 0.68  CALCIUM 8.7*  --  8.9  --  No results found.   Dg Chest 2 View  Result Date: 06/17/2016 CLINICAL DATA:  Pt c/o chest cramping and pain under left breast since 1pm today. States that she "can't breathe good" hx heart attack with stent placement in March 2017. Smoker 1.5ppd. Diabetic. EXAM: CHEST  2 VIEW COMPARISON:  Chest x-ray dated 04/21/2016.  FINDINGS: Heart size and mediastinal contours are normal. Median sternotomy wires appear intact and stable alignment, for CABG. Subtle small nodular densities project over the right lower lung, possible pulmonary nodules. Lungs otherwise clear. No pleural effusion or pneumothorax seen. No acute or suspicious osseous finding. IMPRESSION: 1. Possible small pulmonary nodules within the right lower lung. Recommend chest CT for further characterization. Additionally, a previous chest CT report of 11/22/2014 recommended a 1 year follow-up CT for additional small nodules in the left lung. 2. No evidence of pneumonia or pulmonary edema. 3. Surgical changes of previous CABG. Electronically Signed   By: Bary Richard M.D.   On: 06/17/2016 16:50   Ct Angio Chest Pe W And/or Wo Contrast  Result Date: 06/17/2016 CLINICAL DATA:  Short of breath and chest pain EXAM: CT ANGIOGRAPHY CHEST WITH CONTRAST TECHNIQUE: Multidetector CT imaging of the chest was performed using the standard protocol during bolus administration of intravenous contrast. Multiplanar CT image reconstructions and MIPs were obtained to evaluate the vascular anatomy. CONTRAST:  60 mL Isovue 370 IV COMPARISON:  Chest x-ray 06/17/2016 FINDINGS: Cardiovascular: Negative for pulmonary embolism. Pulmonary arteries normal in caliber. Negative for aortic aneurysm or dissection. Prior CABG. Heart size normal. No pericardial effusion. Mediastinum/Nodes: Small hiatal hernia. No mass or adenopathy in the mediastinum. Lungs/Pleura: Lungs are clear. No infiltrate or effusion. No lung mass identified. Upper Abdomen: Negative Musculoskeletal: Negative thoracic spine. Prior median sternotomy and CABG. Review of the MIP images confirms the above findings. IMPRESSION: Negative for pulmonary embolism.  No acute abnormality in the chest. Electronically Signed   By: Marlan Palau M.D.   On: 06/17/2016 21:42    Results/Tests Pending at Time of Discharge: None  Discharge  Medications:    Medication List    TAKE these medications   albuterol 108 (90 Base) MCG/ACT inhaler Commonly known as:  PROVENTIL HFA;VENTOLIN HFA Inhale 2 puffs into the lungs every 6 (six) hours as needed for wheezing or shortness of breath.   aspirin EC 81 MG tablet Take 81 mg by mouth daily.   atorvastatin 40 MG tablet Commonly known as:  LIPITOR Take 1 tablet (40 mg total) by mouth daily at 6 PM.   carvedilol 3.125 MG tablet Commonly known as:  COREG Take 3.125 mg by mouth 2 (two) times daily with a meal.   fentaNYL 25 MCG/HR patch Commonly known as:  DURAGESIC - dosed mcg/hr Place 25 mcg onto the skin every other day.   Fluticasone-Salmeterol 100-50 MCG/DOSE Aepb Commonly known as:  ADVAIR Inhale 1 puff into the lungs 2 (two) times daily as needed (shortness of breath).   insulin glargine 100 unit/mL Sopn Commonly known as:  LANTUS Inject 50-55 Units into the skin See admin instructions. Inject 55 units subcutaneously every morning and 50 units at night   lisinopril 2.5 MG tablet Commonly known as:  PRINIVIL,ZESTRIL Take 1 tablet (2.5 mg total) by mouth daily.   nitroGLYCERIN 0.4 MG SL tablet Commonly known as:  NITROSTAT Place 1 tablet (0.4 mg total) under the tongue every 5 (five) minutes as needed for chest pain.   promethazine 25 MG tablet Commonly known as:  PHENERGAN Take 25  mg by mouth every 6 (six) hours as needed for nausea or vomiting.   sertraline 50 MG tablet Commonly known as:  ZOLOFT Take 150 mg by mouth daily.   ticagrelor 90 MG Tabs tablet Commonly known as:  BRILINTA Take 1 tablet (90 mg total) by mouth 2 (two) times daily.   tiotropium 18 MCG inhalation capsule Commonly known as:  SPIRIVA Place 18 mcg into inhaler and inhale daily as needed (shortness of breath).   traZODone 50 MG tablet Commonly known as:  DESYREL Take 50 mg by mouth at bedtime.       Discharge Instructions: Please refer to Patient Instructions section of EMR for  full details.  Patient was counseled important signs and symptoms that should prompt return to medical care, changes in medications, dietary instructions, activity restrictions, and follow up appointments.   Follow-Up Appointments: Follow-up Information    CLOWARD,DAVIS L, MD. Schedule an appointment as soon as possible for a visit in 1 week(s).   Specialty:  Internal Medicine Why:  hospital follow up Contact information: [address] [place] [address] [phone]           Beaulah Dinning, MD 06/21/2016, 9:41 PM [facility]

## 2016-06-20 NOTE — Discharge Instructions (Signed)
You were admitted for chest pain work up.  You had a cardiac catheterization that showed no changes from previous.  You should follow up with your PCP in the next week.   Chest Pain Observation It is often hard to give a specific diagnosis for the cause of chest pain. Among other possibilities your symptoms might be caused by inadequate oxygen delivery to your heart (angina). Angina that is not treated or evaluated can lead to a heart attack (myocardial infarction) or death. Blood tests, electrocardiograms, and X-rays may have been done to help determine a possible cause of your chest pain. After evaluation and observation, your health care provider has determined that it is unlikely your pain was caused by an unstable condition that requires hospitalization. However, a full evaluation of your pain may need to be completed, with additional diagnostic testing as directed. It is very important to keep your follow-up appointments. Not keeping your follow-up appointments could result in permanent heart damage, disability, or death. If there is any problem keeping your follow-up appointments, you must call your health care provider. HOME CARE INSTRUCTIONS  Due to the slight chance that your pain could be angina, it is important to follow your health care provider's treatment plan and also maintain a healthy lifestyle:  Maintain or work toward achieving a healthy weight.  Stay physically active and exercise regularly.  Decrease your salt intake.  Eat a balanced, healthy diet. Talk to a dietitian to learn about heart-healthy foods.  Increase your fiber intake by including whole grains, vegetables, fruits, and nuts in your diet.  Avoid situations that cause stress, anger, or depression.  Take medicines as advised by your health care provider. Report any side effects to your health care provider. Do not stop medicines or adjust the dosages on your own.  Quit smoking. Do not use nicotine patches or gum  until you check with your health care provider.  Keep your blood pressure, blood sugar, and cholesterol levels within normal limits.  Limit alcohol intake to no more than 1 drink per day for women who are not pregnant and 2 drinks per day for men.  Do not abuse drugs. SEEK IMMEDIATE MEDICAL CARE IF: You have severe chest pain or pressure which may include symptoms such as:  You feel pain or pressure in your arms, neck, jaw, or back.  You have severe back or abdominal pain, feel sick to your stomach (nauseous), or throw up (vomit).  You are sweating profusely.  You are having a fast or irregular heartbeat.  You feel short of breath while at rest.  You notice increasing shortness of breath during rest, sleep, or with activity.  You have chest pain that does not get better after rest or after taking your usual medicine.  You wake from sleep with chest pain.  You are unable to sleep because you cannot breathe.  You develop a frequent cough or you are coughing up blood.  You feel dizzy, faint, or experience extreme fatigue.  You develop severe weakness, dizziness, fainting, or chills. Any of these symptoms may represent a serious problem that is an emergency. Do not wait to see if the symptoms will go away. Call your local emergency services (911 in the [place] Do not drive yourself to the hospital. MAKE SURE YOU:  Understand these instructions.  Will watch your condition.  Will get help right away if you are not doing well or get worse.   This information is not intended to replace advice given  to you by your health care provider. Make sure you discuss any questions you have with your health care provider.   Document Released: 09/22/2010 Document Revised: 08/25/2013 Document Reviewed: 02/19/2013 Elsevier Interactive Patient Education [employer].

## 2016-06-20 NOTE — Progress Notes (Signed)
Family Medicine Teaching Service Daily Progress Note Intern Pager: [phone]  Patient name: Sara Hobbs Medical record number: [id number] Date of birth: [birth year] Age: 55 y.o. Gender: female  Primary Care Provider: Lavell IslamLOWARD,DAVIS L, MD Consultants: Cardiology Code Status: Full  Pt Overview and Major Events to Date:  10/15: Troponins negative 10/16: Consult cardiology 10/17: LE doppler, LHC 10/18: Discharge  Assessment and Plan: Sara DearLoretta Hobbs a 55 y.o.femalepresenting with chest pain. PMH is significant for CAD status post CABG and recent stent placement, CHF, COPD, type 2 diabetes  Chest pain: Unclear etiology. No changes on cardiac cath so, per cardiology, are unlikely to explain her acute chest pain symptoms. Recommend continued medical management - c/s cards, appreciate recs - continue meds as below, see CAD  CAD: Status post CABG, and ACS with PEAarrest in 3/17, found to have significant stenosis in the native left circumflex treated with drug-eluting stent.  - continue brilinta 90mg  daily - continue coreg 3.125mg  BID - continue lisinopril 2.5mg  daily  Tobacco abuse: Patient currently smokes 1.5 ppd. Smoking cessation encouraged during this admission.   Leg pain: LE dopplers NEGATIVE for DVT. Right calf slightly larger than left.Patient complained of right leg pain but says she has no pain this morning. Describes "knots in calf". Had very mild pain with palpation. No edema. CTA negative for PE.Well's Score for DVT -1, making this very unlikely cause of pain.   COPD:Patient smokes 1.5 packs per day down from 3. Denies shortness of breath at this time and has clear lungs. No home O2. Takes albuterol PRN and advair and spiriva. Chest xray showed 2 small pulmonary nodules in right lower lobe - Follow up CT scan outpatient - Continue home regimen  Ischemic cardiomyopathyLast EF 45-50% 3/17. Denies SOB, not volume overloaded on physical exam. CXR shows no signs  of pulmonary edema and lungs were clear.  - Continue Coreg, Brilinta, lisinopril  Type 2 diabetes: A1c 12.5. CBG upon presentation was 305. This morning 107. On 55U Lantus in AM, 50U Lantus qhs at home. - Lantus 30u BID - SSI - f/u with PCP for more intense control of diabetes  Hyperlipidemia: Last lipid panel 01/2016 with normal total cholesterol (140), elevated triglycerides (283), and decreased HDL (31). - continue lipitor 40mg ; consider increasing to 80mg .   Anxiety/depression: Stable - continue 150mg  zoloft  FEN/GI: heart healthy carb modified diet Prophylaxis: Brilinta /lovenox  Disposition: Telemetry  Subjective:  Patient is sitting up eating breakfast this morning. She says she is slightly nauseous and has a mild headache, but she denies any chest pain, shortness of breath, abdominal pain, leg pain, and vomiting. She feels ready to go home today.  Objective: Temp:  [98.7 F (37.1 C)-99.5 F (37.5 C)] 98.7 F (37.1 C) (10/18 0428) Pulse Rate:  [66-81] 66 (10/18 0428) Resp:  [9-25] 18 (10/18 0428) BP: (101-145)/(54-79) 135/60 (10/18 0428) SpO2:  [97 %-100 %] 98 % (10/18 0428) Physical Exam: General: 55 year old female sitting up in bed Cardiovascular: Regular rate and rhythm, S1-S2 present, no murmurs, some tenderness under left breast-says ribs were broken when they did CPR in March Respiratory: Normal work of breathing on RA,clear to auscultation bilaterally, no wheezing or rhonchi Gastrointestinal: Soft, nontender, nondistended MSK: Moves all extremities, right calf slight larger than left, no palpable cord or erythema Extremities: No cyanosis clubbing or edema Neuro: Alert and oriented 3, no focal neurological deficits Psych: Appropriatemood and affect  Laboratory:  Recent Labs Lab 06/17/16 1930 06/19/16 0253 06/19/16 1434  WBC 6.8 7.0 6.3  HGB 15.2* 14.1 14.3  HCT 42.5 40.7 40.5  PLT 165 148* 154    Recent Labs Lab 06/17/16 1930 06/17/16 2031  06/19/16 0253 06/19/16 1434  NA 134*  --  137  --   K 4.3  --  3.6  --   CL 105  --  107  --   CO2 21*  --  23  --   BUN 10  --  6  --   CREATININE 0.71 0.60 0.72 0.68  CALCIUM 8.7*  --  8.9  --   GLUCOSE 305*  --  182*  --     Imaging/Diagnostic Tests: Dg Chest 2 View  Result Date: 06/17/2016 CLINICAL DATA:  Pt c/o chest cramping and pain under left breast since 1pm today. States that she "can't breathe good" hx heart attack with stent placement in March 2017. Smoker 1.5ppd. Diabetic. EXAM: CHEST  2 VIEW COMPARISON:  Chest x-ray dated 04/21/2016. FINDINGS: Heart size and mediastinal contours are normal. Median sternotomy wires appear intact and stable alignment, for CABG. Subtle small nodular densities project over the right lower lung, possible pulmonary nodules. Lungs otherwise clear. No pleural effusion or pneumothorax seen. No acute or suspicious osseous finding. IMPRESSION: 1. Possible small pulmonary nodules within the right lower lung. Recommend chest CT for further characterization. Additionally, a previous chest CT report of 11/22/2014 recommended a 1 year follow-up CT for additional small nodules in the left lung. 2. No evidence of pneumonia or pulmonary edema. 3. Surgical changes of previous CABG. Electronically Signed   By: Bary Richard M.D.   On: 06/17/2016 16:50   Ct Angio Chest Pe W And/or Wo Contrast  Result Date: 06/17/2016 CLINICAL DATA:  Short of breath and chest pain EXAM: CT ANGIOGRAPHY CHEST WITH CONTRAST TECHNIQUE: Multidetector CT imaging of the chest was performed using the standard protocol during bolus administration of intravenous contrast. Multiplanar CT image reconstructions and MIPs were obtained to evaluate the vascular anatomy. CONTRAST:  60 mL Isovue 370 IV COMPARISON:  Chest x-ray 06/17/2016 FINDINGS: Cardiovascular: Negative for pulmonary embolism. Pulmonary arteries normal in caliber. Negative for aortic aneurysm or dissection. Prior CABG. Heart size  normal. No pericardial effusion. Mediastinum/Nodes: Small hiatal hernia. No mass or adenopathy in the mediastinum. Lungs/Pleura: Lungs are clear. No infiltrate or effusion. No lung mass identified. Upper Abdomen: Negative Musculoskeletal: Negative thoracic spine. Prior median sternotomy and CABG. Review of the MIP images confirms the above findings. IMPRESSION: Negative for pulmonary embolism.  No acute abnormality in the chest. Electronically Signed   By: Marlan Palau M.D.   On: 06/17/2016 21:42    Evlyn Courier, Medical Student 06/20/2016, 7:58 AM [facility] Intern pager: [phone], text pages welcome  I have separately seen and examined the patient. I have discussed the findings and exam with student doctor Evlyn Courier and agree with the above note.  My changes/additions are outlined in BLUE.   General: pleasant female laying in bed reading  Cardiovascular: Regular rate and rhythm, no murmurs appreciated Respiratory: Normal work of breathing,clear to auscultation bilaterally Gastrointestinal: Soft, nontender, nondistended MSK: Moves all extremities, right calf slight larger than left, no palpable cord or erythema Extremities: No cyanosis clubbing or edema Neuro: Alert and oriented 3, no focal neurological deficits Psych: Appropriatemood and affect  Anders Simmonds, MD [facility], PGY-2

## 2016-07-28 ENCOUNTER — Encounter (HOSPITAL_COMMUNITY): Payer: Self-pay | Admitting: Emergency Medicine

## 2016-07-28 ENCOUNTER — Emergency Department (HOSPITAL_COMMUNITY)
Admission: EM | Admit: 2016-07-28 | Discharge: 2016-07-28 | Disposition: A | Discharge status: Home / Self Care | Payer: BLUE CROSS/BLUE SHIELD | Attending: Emergency Medicine | Admitting: Emergency Medicine

## 2016-07-28 DIAGNOSIS — E039 Hypothyroidism, unspecified: Secondary | ICD-10-CM | POA: Diagnosis not present

## 2016-07-28 DIAGNOSIS — E1140 Type 2 diabetes mellitus with diabetic neuropathy, unspecified: Secondary | ICD-10-CM | POA: Insufficient documentation

## 2016-07-28 DIAGNOSIS — Z79899 Other long term (current) drug therapy: Secondary | ICD-10-CM | POA: Diagnosis not present

## 2016-07-28 DIAGNOSIS — R531 Weakness: Secondary | ICD-10-CM | POA: Diagnosis present

## 2016-07-28 DIAGNOSIS — I110 Hypertensive heart disease with heart failure: Secondary | ICD-10-CM | POA: Diagnosis not present

## 2016-07-28 DIAGNOSIS — E860 Dehydration: Secondary | ICD-10-CM | POA: Insufficient documentation

## 2016-07-28 DIAGNOSIS — I509 Heart failure, unspecified: Secondary | ICD-10-CM | POA: Insufficient documentation

## 2016-07-28 DIAGNOSIS — Z7982 Long term (current) use of aspirin: Secondary | ICD-10-CM | POA: Insufficient documentation

## 2016-07-28 DIAGNOSIS — Z955 Presence of coronary angioplasty implant and graft: Secondary | ICD-10-CM | POA: Diagnosis not present

## 2016-07-28 DIAGNOSIS — F17210 Nicotine dependence, cigarettes, uncomplicated: Secondary | ICD-10-CM | POA: Diagnosis not present

## 2016-07-28 DIAGNOSIS — I2510 Atherosclerotic heart disease of native coronary artery without angina pectoris: Secondary | ICD-10-CM | POA: Diagnosis not present

## 2016-07-28 DIAGNOSIS — J449 Chronic obstructive pulmonary disease, unspecified: Secondary | ICD-10-CM | POA: Insufficient documentation

## 2016-07-28 DIAGNOSIS — Z951 Presence of aortocoronary bypass graft: Secondary | ICD-10-CM | POA: Insufficient documentation

## 2016-07-28 DIAGNOSIS — R739 Hyperglycemia, unspecified: Secondary | ICD-10-CM

## 2016-07-28 DIAGNOSIS — I252 Old myocardial infarction: Secondary | ICD-10-CM | POA: Insufficient documentation

## 2016-07-28 DIAGNOSIS — E1165 Type 2 diabetes mellitus with hyperglycemia: Secondary | ICD-10-CM | POA: Diagnosis not present

## 2016-07-28 DIAGNOSIS — Z794 Long term (current) use of insulin: Secondary | ICD-10-CM | POA: Insufficient documentation

## 2016-07-28 LAB — CBC WITH DIFFERENTIAL/PLATELET
BASOS PCT: 1 %
Basophils Absolute: 0.1 10*3/uL (ref 0.0–0.1)
EOS ABS: 0.1 10*3/uL (ref 0.0–0.7)
EOS PCT: 1 %
HCT: 47.4 % — ABNORMAL HIGH (ref 36.0–46.0)
HEMOGLOBIN: 17.1 g/dL — AB (ref 12.0–15.0)
Lymphocytes Relative: 19 %
Lymphs Abs: 1.9 10*3/uL (ref 0.7–4.0)
MCH: 30.9 pg (ref 26.0–34.0)
MCHC: 36.1 g/dL — AB (ref 30.0–36.0)
MCV: 85.6 fL (ref 78.0–100.0)
Monocytes Absolute: 0.5 10*3/uL (ref 0.1–1.0)
Monocytes Relative: 5 %
NEUTROS PCT: 74 %
Neutro Abs: 7.4 10*3/uL (ref 1.7–7.7)
PLATELETS: 186 10*3/uL (ref 150–400)
RBC: 5.54 MIL/uL — AB (ref 3.87–5.11)
RDW: 12.7 % (ref 11.5–15.5)
WBC: 9.9 10*3/uL (ref 4.0–10.5)

## 2016-07-28 LAB — URINALYSIS, ROUTINE W REFLEX MICROSCOPIC
BILIRUBIN URINE: NEGATIVE
Glucose, UA: >1000 mg/dL — AB
HGB URINE DIPSTICK: NEGATIVE
Ketones, ur: NEGATIVE mg/dL
Leukocytes, UA: NEGATIVE
NITRITE: NEGATIVE
PROTEIN: NEGATIVE mg/dL
SPECIFIC GRAVITY, URINE: 1.011 (ref 1.005–1.030)
pH: 5.5 (ref 5.0–8.0)

## 2016-07-28 LAB — BASIC METABOLIC PANEL
Anion gap: 11 (ref 5–15)
BUN: 12 mg/dL (ref 6–20)
CALCIUM: 9.5 mg/dL (ref 8.9–10.3)
CHLORIDE: 100 mmol/L — AB (ref 101–111)
CO2: 22 mmol/L (ref 22–32)
CREATININE: 0.81 mg/dL (ref 0.44–1.00)
GFR calc Af Amer: >60 mL/min (ref >60)
GFR calc non Af Amer: >60 mL/min (ref >60)
GLUCOSE: 357 mg/dL — AB (ref 65–99)
Potassium: 4.3 mmol/L (ref 3.5–5.1)
Sodium: 133 mmol/L — ABNORMAL LOW (ref 135–145)

## 2016-07-28 LAB — URINE MICROSCOPIC-ADD ON: Bacteria, UA: NONE SEEN

## 2016-07-28 LAB — HEPATIC FUNCTION PANEL
ALK PHOS: 117 U/L (ref 38–126)
ALT: 49 U/L (ref 14–54)
AST: 28 U/L (ref 15–41)
Albumin: 4.3 g/dL (ref 3.5–5.0)
BILIRUBIN DIRECT: 0.2 mg/dL (ref 0.1–0.5)
BILIRUBIN TOTAL: 0.8 mg/dL (ref 0.3–1.2)
Indirect Bilirubin: 0.6 mg/dL (ref 0.3–0.9)
Total Protein: 7 g/dL (ref 6.5–8.1)

## 2016-07-28 MED ORDER — SODIUM CHLORIDE 0.9 % IV BOLUS (SEPSIS)
1000.0000 mL | Freq: Once | INTRAVENOUS | Status: AC
  Administered 2016-07-28: 1000 mL via INTRAVENOUS

## 2016-07-28 NOTE — ED Notes (Signed)
Pt voided using bed pain. Linens changed.

## 2016-07-28 NOTE — ED Triage Notes (Signed)
Per EMS- pt from home. Pt reports weakness with chills since yesterday. Pt also having diarrhea. Pt noted to have a tremor. Pt not drinking water, had soda today X1. CBG 362, pt takes medications for diabetes and states she normally runs this high. Denies acute pain.

## 2016-07-28 NOTE — ED Notes (Signed)
Pt given a sprite 

## 2016-07-28 NOTE — ED Provider Notes (Signed)
[facility]-EMERGENCY DEPT Provider Note   CSN: [id number] Arrival date & time: 07/28/16  1243     History   Chief Complaint Chief Complaint  Patient presents with  . Weakness  . Diarrhea    HPI Sara Hobbs is a 55 y.o. female.  She presents for evaluation of general achiness, hot and cold feeling, tingling in her arms and legs, and nausea. Symptoms present for several weeks, and persistent. She has had some occasional chest pain but currently is chest pain-free. She states she is taking her usual medications. She was hospitalized, one month ago, with uncontrolled diabetes. She saw her PCP once since hospital discharge. At that time, her sugar was high, but no other interventions were undertaken. There are no other known modifying factors.  HPI  Past Medical History:  Diagnosis Date  . Angina pectoris (HCC)   . Anxiety   . Arthritis    "hands, neck, back, knees, hips, ankles" (11/22/2014)  . Asthma   . CAD (coronary artery disease)    a. 09/2014: LIMA to LAD, SVG to PDA, EVH via right thigh b. Cardiac Arrest 11/2015: patent LIMA-LAD, CTO if SVG-PDA, 99% stenosis of Prox-Mid Cx w/ DES placed  . CAD, multiple vessel   . Cardiac arrest (HCC) 11/25/2015  . Chest tightness   . CHF (congestive heart failure) (HCC)   . Chronic lower back pain   . Chronic shoulder pain    "right; I've got bad rotator cuff"  . COPD (chronic obstructive pulmonary disease) (HCC)   . Depression   . Diabetes mellitus with complication (HCC)   . GERD (gastroesophageal reflux disease)   . Headache    "monthly" (11/22/2014)  . History of blood transfusion    "when I had my 1st youngun; when I had MI"  . History of hiatal hernia   . History of stomach ulcers   . Hyperlipidemia   . Hypothyroidism    PMH:  . IDDM (insulin dependent diabetes mellitus) (HCC)   . Incidental lung nodule, > 3mm and < 8mm 11/22/2014   Small non-calcified nodules left lung  . Myocardial infarction 11/2013   PCI w/ drug eluting  stent LCx  . Neuropathy due to secondary diabetes (HCC)   . Wears glasses     Patient Active Problem List   Diagnosis Date Noted  . Chest pain 06/17/2016  . Chest pain with moderate risk of acute coronary syndrome 04/21/2016  . Uncontrolled insulin dependent diabetes mellitus (HCC)   . Acute respiratory failure with hypoxia (HCC)   . Cardiac arrest (HCC) 11/25/2015  . PEA (Pulseless electrical activity) (HCC) 11/25/2015  . Respiratory failure (HCC) 11/25/2015  . Wound cellulitis 12/13/2014  . Wound infection 11/23/2014  . Cellulitis of sternum 11/22/2014  . Incidental lung nodule, > 3mm and < 8mm 11/22/2014  . 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 Arthritis   . Back pain, chronic   . Chronic shoulder pain   . COPD (chronic obstructive pulmonary disease) (HCC)   . Diabetes mellitus with complication (HCC)   . CAD, multiple vessel   . Hyperlipidemia   . GERD (gastroesophageal reflux disease)   . Chest tightness   . Angina pectoris (HCC)   . H/O heart artery stent 11/01/2013    Past Surgical History:  Procedure Laterality Date  . ABDOMINAL HYSTERECTOMY  1990's  . APPLICATION OF WOUND VAC N/A 11/23/2014   Procedure: POSSIBLE APPLICATION OF WOUND VAC;  Surgeon: Purcell Nails, MD;  Location: [facility] OR;  Service: Thoracic;  Laterality: N/A;  . BREAST SURGERY Left 1990's   breast duct surgery  . CARDIAC CATHETERIZATION  09/01/14  . CARDIAC CATHETERIZATION N/A 11/25/2015   Procedure: Left Heart Cath and Cors/Grafts Angiography;  Surgeon: Tonny BollmanMichael Cooper, MD;  [facility];  Service: Cardiovascular;  Laterality: N/A;  . CARDIAC CATHETERIZATION N/A 11/25/2015   Procedure: Coronary Stent Intervention;  Surgeon: Tonny BollmanMichael Cooper, MD;  [facility];  Service: Cardiovascular;  Laterality: N/A;  . CARDIAC CATHETERIZATION N/A 06/19/2016   Procedure: Left Heart Cath and Cors/Grafts Angiography;  Surgeon: Peter M SwazilandJordan, MD;  [facility];  Service:  Cardiovascular;  Laterality: N/A;  . CESAREAN SECTION  1982; 1984  . COLONOSCOPY    . CORONARY ANGIOPLASTY WITH STENT PLACEMENT  11/11/2013   Drug-eluting stent in LCx  . CORONARY ARTERY BYPASS GRAFT N/A 09/22/2014   Procedure: CORONARY ARTERY BYPASS GRAFTING (CABG);  Surgeon: Purcell Nailslarence H Owen, MD;  [facility];  Service: Open Heart Surgery;  Laterality: N/A;  Times 2 using left internal mammary artery and endoscopically harvested right saphenous vein  . ESOPHAGOGASTRODUODENOSCOPY    . GASTRIC RESECTION  1990's   bezoar; "for reflux"  . LAPAROSCOPIC CHOLECYSTECTOMY  2000  . STERNAL WOUND DEBRIDEMENT N/A 11/23/2014   Procedure: SUPERFICIAL STERNAL WOUND DEBRIDEMENT;  Surgeon: Purcell Nailslarence H Owen, MD;  Location: [facility] OR;  Service: Thoracic;  Laterality: N/A;  . TEE WITHOUT CARDIOVERSION N/A 09/22/2014   Procedure: TRANSESOPHAGEAL ECHOCARDIOGRAM (TEE);  Surgeon: Purcell Nailslarence H Owen, MD;  [facility];  Service: Open Heart Surgery;  Laterality: N/A;    OB History    No data available       Home Medications    Prior to Admission medications   Medication Sig Start Date End Date Taking? Authorizing Provider  albuterol (PROVENTIL HFA;VENTOLIN HFA) 108 (90 BASE) MCG/ACT inhaler Inhale 2 puffs into the lungs every 6 (six) hours as needed for wheezing or shortness of breath.   Yes Historical Provider, MD  aspirin EC 81 MG tablet Take 81 mg by mouth daily.   Yes Historical Provider, MD  atorvastatin (LIPITOR) 40 MG tablet Take 1 tablet (40 mg total) by mouth daily at 6 PM. 12/06/15  Yes Scott T Alben SpittleWeaver, PA-C  carvedilol (COREG) 3.125 MG tablet Take 3.125 mg by mouth 2 (two) times daily with a meal.   Yes Historical Provider, MD  fentaNYL (DURAGESIC - DOSED MCG/HR) 25 MCG/HR patch Place 25 mcg onto the skin every other day.   Yes Historical Provider, MD  Fluticasone-Salmeterol (ADVAIR) 100-50 MCG/DOSE AEPB Inhale 1 puff into the lungs 2 (two) times daily as needed (shortness of breath).    Yes Historical  Provider, MD  insulin aspart (NOVOLOG FLEXPEN) 100 UNIT/ML FlexPen Inject 5 Units into the skin every evening.   Yes Historical Provider, MD  insulin glargine (LANTUS) 100 unit/mL SOPN Inject 50-55 Units into the skin See admin instructions. Inject 55 units subcutaneously every morning and 50 units at night   Yes Historical Provider, MD  lisinopril (PRINIVIL,ZESTRIL) 2.5 MG tablet Take 1 tablet (2.5 mg total) by mouth daily. 12/06/15  Yes Scott T Alben SpittleWeaver, PA-C  nitroGLYCERIN (NITROSTAT) 0.4 MG SL tablet Place 1 tablet (0.4 mg total) under the tongue every 5 (five) minutes as needed for chest pain. 12/06/15  Yes Beatrice LecherScott T Weaver, PA-C  ticagrelor (BRILINTA) 90 MG TABS tablet Take 1 tablet (90 mg total) by mouth 2 (two) times daily. 05/14/16  Yes Beatrice LecherScott T Weaver, PA-C  tiotropium (  SPIRIVA) 18 MCG inhalation capsule Place 18 mcg into inhaler and inhale daily as needed (shortness of breath).    Yes Historical Provider, MD  traZODone (DESYREL) 50 MG tablet Take 50 mg by mouth at bedtime.   Yes Historical Provider, MD    Family History Family History  Problem Relation Age of Onset  . Adopted: Yes  . Family history unknown: Yes    Social History Social History  Substance Use Topics  . Smoking status: Current Every Day Smoker    Packs/day: 2.00    Years: 41.00    Types: Cigarettes  . Smokeless tobacco: Former Neurosurgeon    Types: Chew    Quit date: 09/13/2005  . Alcohol use No     Allergies   Wellbutrin [bupropion]   Review of Systems Review of Systems  All other systems reviewed and are negative.    Physical Exam Updated Vital Signs BP 108/56   Pulse 84   Temp 98.5 F (36.9 C)   Resp 18   SpO2 95%   Physical Exam  Constitutional: She is oriented to person, place, and time. She appears well-developed.  Obese, appears older than stated age.  HENT:  Head: Normocephalic and atraumatic.  Eyes: Conjunctivae and EOM are normal. Pupils are equal, round, and reactive to light.  Neck: Normal  range of motion and phonation normal. Neck supple.  Cardiovascular: Normal rate and regular rhythm.   Pulmonary/Chest: Effort normal and breath sounds normal. She exhibits no tenderness.  Abdominal: Soft. She exhibits no distension. There is no tenderness. There is no guarding.  Musculoskeletal: Normal range of motion. She exhibits no edema.  Neurological: She is alert and oriented to person, place, and time. She exhibits normal muscle tone.  No dysarthria, aphasia or nystagmus.  Skin: Skin is warm and dry.  Psychiatric: She has a normal mood and affect. Her behavior is normal. Judgment and thought content normal.  Nursing note and vitals reviewed.    ED Treatments / Results  Labs (all labs ordered are listed, but only abnormal results are displayed) Labs Reviewed  BASIC METABOLIC PANEL - Abnormal; Notable for the following:       Result Value   Sodium 133 (*)    Chloride 100 (*)    Glucose, Bld 357 (*)    All other components within normal limits  CBC WITH DIFFERENTIAL/PLATELET - Abnormal; Notable for the following:    RBC 5.54 (*)    Hemoglobin 17.1 (*)    HCT 47.4 (*)    MCHC 36.1 (*)    All other components within normal limits  URINALYSIS, ROUTINE W REFLEX MICROSCOPIC (NOT AT [facility]) - Abnormal; Notable for the following:    Glucose, UA >1000 (*)    All other components within normal limits  URINE MICROSCOPIC-ADD ON - Abnormal; Notable for the following:    Squamous Epithelial / LPF 0-5 (*)    All other components within normal limits  HEPATIC FUNCTION PANEL    EKG  EKG Interpretation  Date/Time:  Saturday July 28 2016 12:49:40 EST Ventricular Rate:  92 PR Interval:    QRS Duration: 106 QT Interval:  397 QTC Calculation: 492 R Axis:   85 Text Interpretation:  Sinus rhythm Borderline repolarization abnormality Minimal ST elevation, anterior leads Borderline prolonged QT interval Baseline wander in lead(s) II III aVL aVF V4 V5 Since last tracing QT has lengthened  Confirmed by Effie Shy  MD, Jaeli Grubb ([id number]) on 07/28/2016 2:11:29 PM       Radiology No  results found.  Procedures Procedures (including critical care time)  Medications Ordered in ED Medications  sodium chloride 0.9 % bolus 1,000 mL (0 mLs Intravenous Stopped 07/28/16 1553)     Initial Impression / Assessment and Plan / ED Course  I have reviewed the triage vital signs and the nursing notes.  Pertinent labs & imaging results that were available during my care of the patient were reviewed by me and considered in my medical decision making (see chart for details).  Clinical Course     Medications  sodium chloride 0.9 % bolus 1,000 mL (0 mLs Intravenous Stopped 07/28/16 1553)    No data found.   At discharge- Reevaluation with update and discussion. After initial assessment and treatment, an updated evaluation reveals tolerating oral fluids. Findings discussed with patient and all questions answered. Zema Lizardo L    Final Clinical Impressions(s) / ED Diagnoses   Final diagnoses:  Dehydration  Hyperglycemia   Nursing Notes Reviewed/ Care Coordinated Applicable Imaging Reviewed Interpretation of Laboratory Data incorporated into ED treatment  The patient appears reasonably screened and/or stabilized for discharge and I doubt any other medical condition or other [facility] requiring further screening, evaluation, or treatment in the ED at this time prior to discharge.  Plan: Home Medications- Continue; Home Treatments- strict adherence to low carbohydrate diet, increase oral fluids; return here if the recommended treatment, does not improve the symptoms; Recommended follow up- PCP checkup in one week     New Prescriptions Discharge Medication List as of 07/28/2016  3:32 PM       Mancel Bale, MD 07/29/16 1558

## 2016-07-28 NOTE — Discharge Instructions (Signed)
It is important to watch your carbohydrate intake.  Try to drink 1 or 2 L of water, each day.  See your Dr. for a checkup, as soon as possible.

## 2016-08-16 IMAGING — CR DG CHEST 2V
2 series · 2 of 2 positions shown · non-contrast
Comparison: 09/25/2014 and earlier.

CLINICAL DATA: 53-year-old female status post CABG 1 month ago.
Reports some residual chest pain. Subsequent encounter.

EXAM:
CHEST  2 VIEW

[w chest pa]
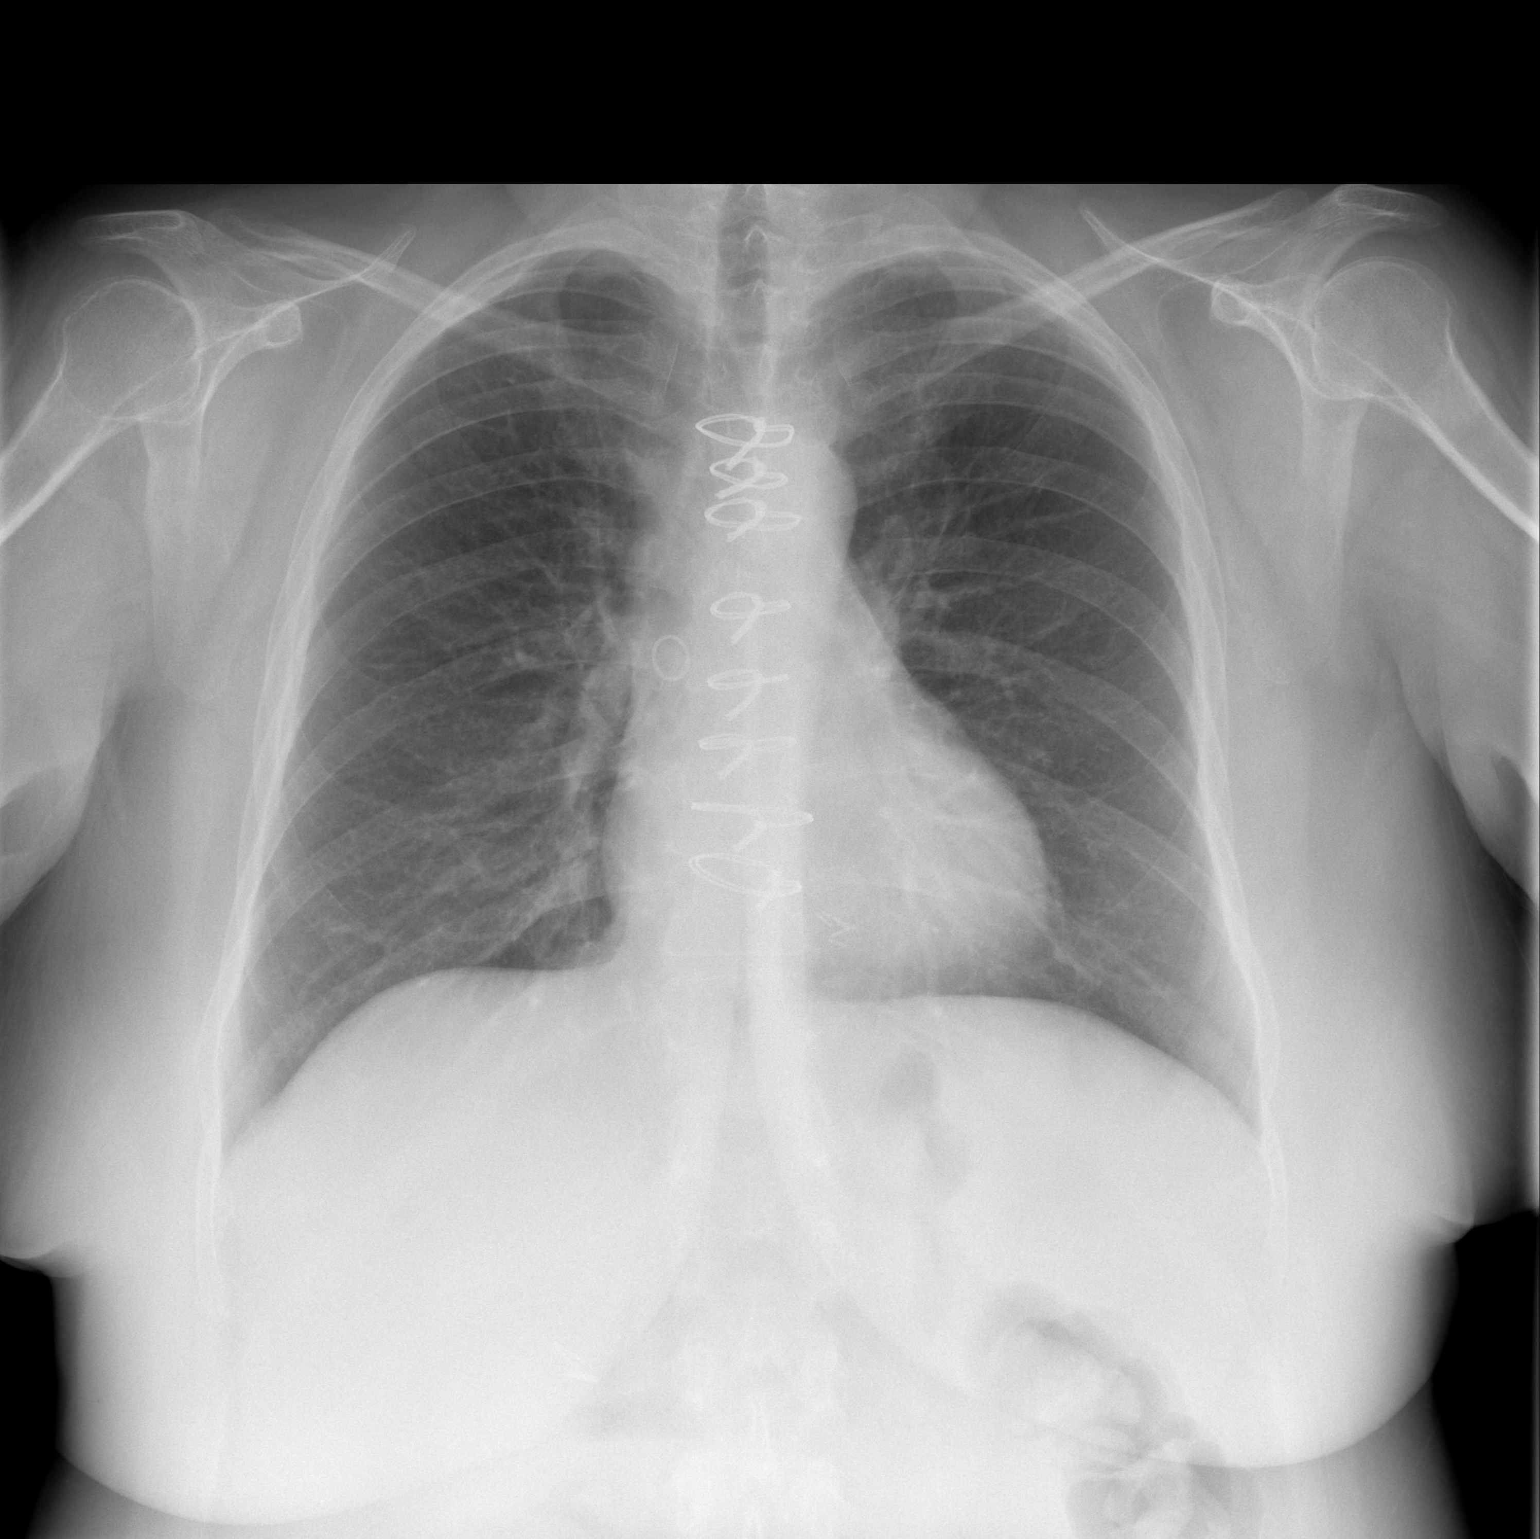

[w chest lat]
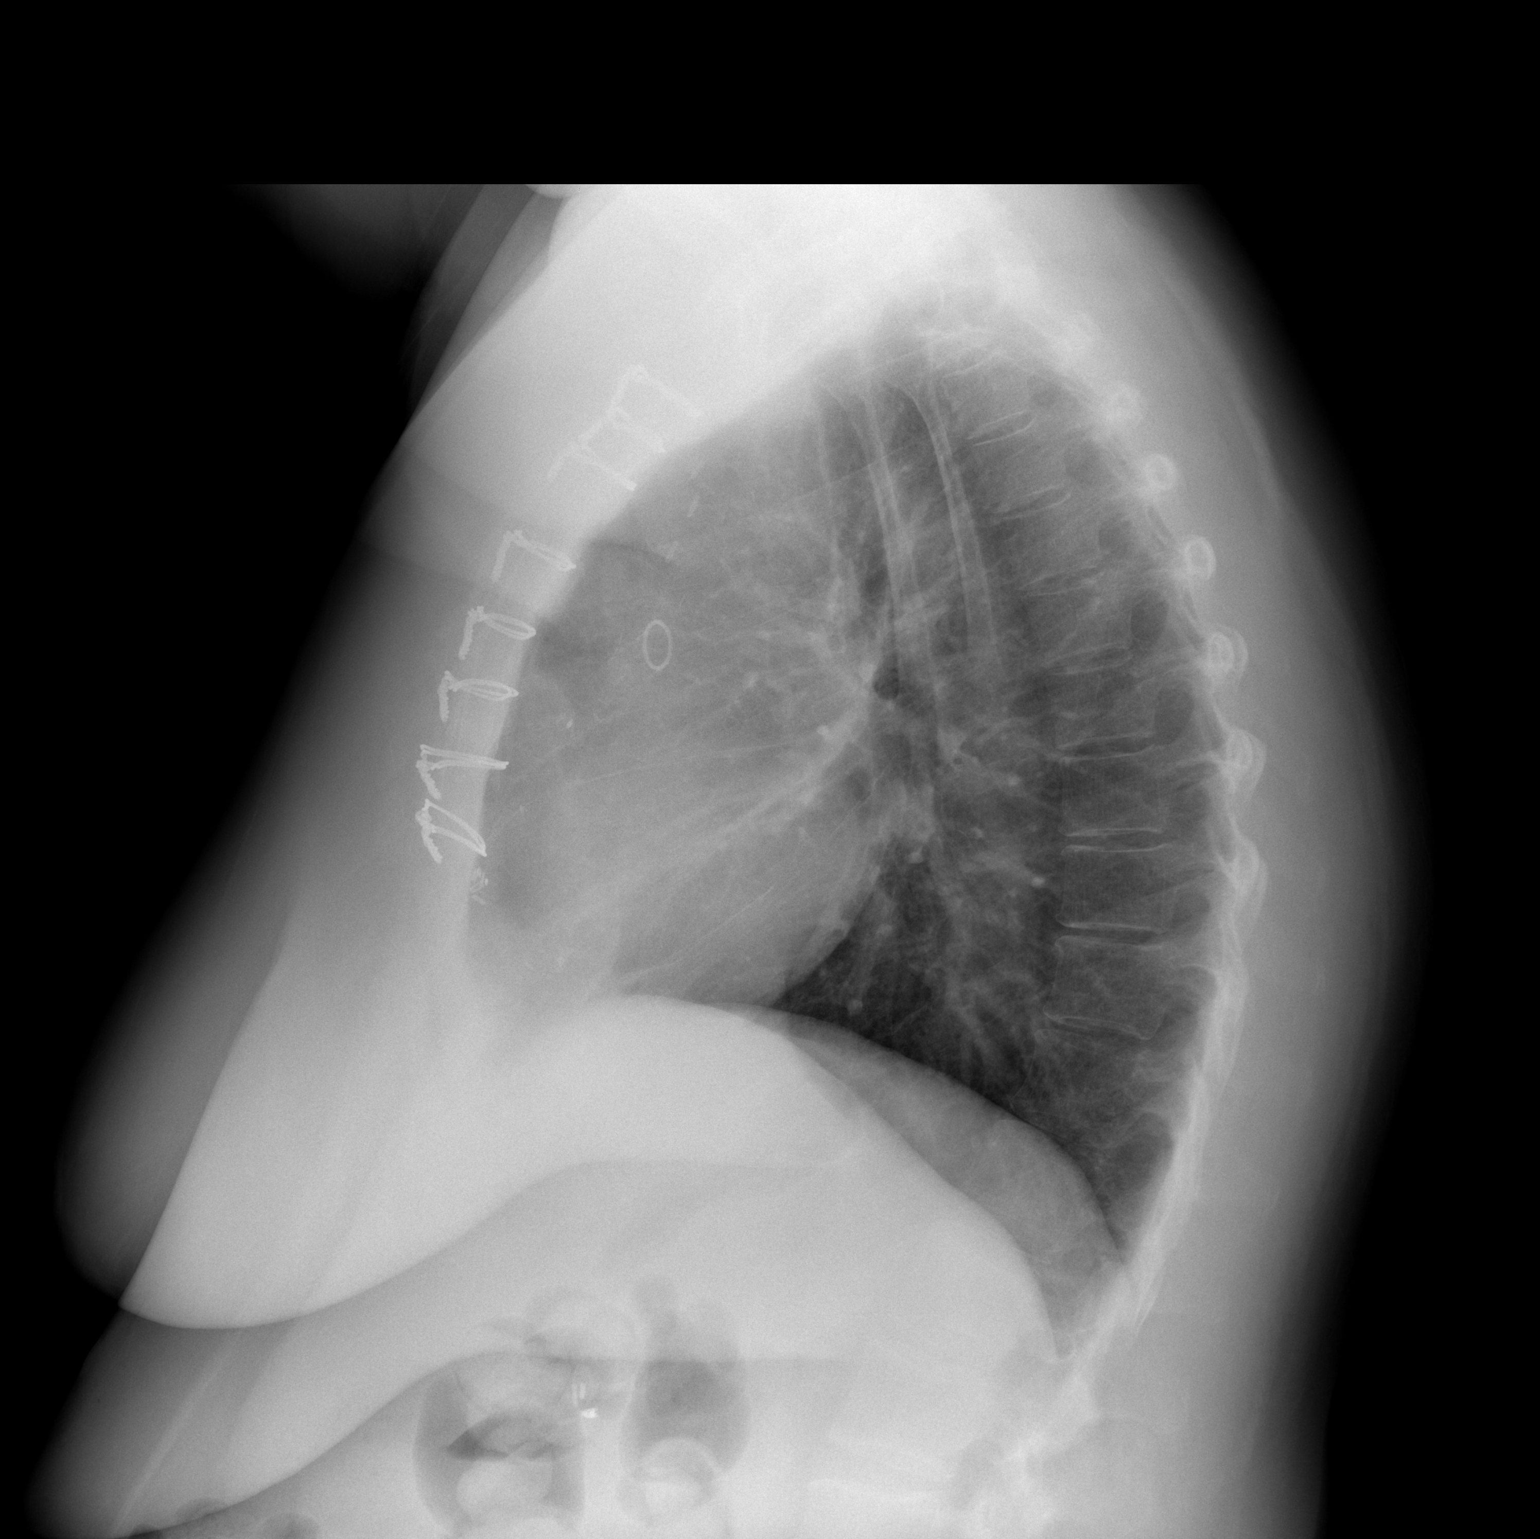

[2 of 2 positions shown; findings below may reference images not displayed]

FINDINGS: Resolved small pleural effusions. Sequelae of CABG. Normal cardiac
size and mediastinal contours. Visualized tracheal air column is
within normal limits. No pneumothorax, pulmonary edema, pleural
effusion or confluent pulmonary opacity. Stable visualized osseous
structures. Stable cholecystectomy clips.
IMPRESSION: Resolved small pleural effusions. No new cardiopulmonary
abnormality.

## 2016-08-23 ENCOUNTER — Encounter: Payer: Self-pay | Admitting: Cardiovascular Disease

## 2016-08-23 ENCOUNTER — Ambulatory Visit (INDEPENDENT_AMBULATORY_CARE_PROVIDER_SITE_OTHER): Payer: BLUE CROSS/BLUE SHIELD | Admitting: Cardiovascular Disease

## 2016-08-23 VITALS — BP 120/70 | HR 84 | Ht 62.0 in | Wt 182.4 lb

## 2016-08-23 DIAGNOSIS — I25119 Atherosclerotic heart disease of native coronary artery with unspecified angina pectoris: Secondary | ICD-10-CM | POA: Diagnosis not present

## 2016-08-23 NOTE — Patient Instructions (Signed)
Medication Instructions:  Your physician recommends that you continue on your current medications as directed. Please refer to the Current Medication list given to you today.   Labwork: NONE  Testing/Procedures: NONE  Follow-Up: Your physician wants you to follow-up in: 6 MONTHS WITH Dr. Excell Seltzer. You will receive a reminder letter in the mail two months in advance. If you don't receive a letter, please call our office to schedule the follow-up appointment.   If you need a refill on your cardiac medications before your next appointment, please call your pharmacy.

## 2016-08-23 NOTE — Progress Notes (Signed)
Cardiology Office Note Date:  08/23/2016   ID:  Sara Hobbs, DOB 04/12/1961, MRN [id number]  PCP:  Lavell Islam, MD  Cardiologist:  Tonny Bollman, MD    Chief Complaint  Patient presents with  . Coronary Artery Disease     History of Present Illness: Sara Hobbs is a 55 y.o. female who presents for follow-up evaluation. The patient has a complex medical history with coronary artery disease and remote CABG, COPD, diabetes, hypertension, hyperlipidemia, and chronic pain syndrome. She presented 11/25/2015 with chest pain, shortness of breath, and PEA arrest in the emergency room. She was resuscitated and underwent emergency cardiac catheterization showing three-vessel coronary artery disease with severe stenosis of the proximal left circumflex. She had patency of the LIMA to LAD graft and chronic occlusion of the vein graft to PDA. She underwent PCI of the native left circumflex without complication. She returned with chest pain  The patient is here with her husband today. She has trouble discriminating different types of chest pain. She admits to shortness of breath with activity. She continues to smoke. She is going to start vaping with nicotine free vapes to try to stop. She denies edema, orthopnea, PND, or heart palpitations.  Past Medical History:  Diagnosis Date  . Angina pectoris (HCC)   . Anxiety   . Arthritis    "hands, neck, back, knees, hips, ankles" (11/22/2014)  . Asthma   . CAD (coronary artery disease)    a. 09/2014: LIMA to LAD, SVG to PDA, EVH via right thigh b. Cardiac Arrest 11/2015: patent LIMA-LAD, CTO if SVG-PDA, 99% stenosis of Prox-Mid Cx w/ DES placed  . CAD, multiple vessel   . Cardiac arrest (HCC) 11/25/2015  . Chest tightness   . CHF (congestive heart failure) (HCC)   . Chronic lower back pain   . Chronic shoulder pain    "right; I've got bad rotator cuff"  . COPD (chronic obstructive pulmonary disease) (HCC)   . Depression   . Diabetes  mellitus with complication (HCC)   . GERD (gastroesophageal reflux disease)   . Headache    "monthly" (11/22/2014)  . History of blood transfusion    "when I had my 1st youngun; when I had MI"  . History of hiatal hernia   . History of stomach ulcers   . Hyperlipidemia   . Hypothyroidism    PMH:  . IDDM (insulin dependent diabetes mellitus) (HCC)   . Incidental lung nodule, > 3mm and < 8mm 11/22/2014   Small non-calcified nodules left lung  . Myocardial infarction 11/2013   PCI w/ drug eluting stent LCx  . Neuropathy due to secondary diabetes (HCC)   . Wears glasses     Past Surgical History:  Procedure Laterality Date  . ABDOMINAL HYSTERECTOMY  1990's  . APPLICATION OF WOUND VAC N/A 11/23/2014   Procedure: POSSIBLE APPLICATION OF WOUND VAC;  Surgeon: Purcell Nails, MD;  Location: [facility] OR;  Service: Thoracic;  Laterality: N/A;  . BREAST SURGERY Left 1990's   breast duct surgery  . CARDIAC CATHETERIZATION  09/01/14  . CARDIAC CATHETERIZATION N/A 11/25/2015   Procedure: Left Heart Cath and Cors/Grafts Angiography;  Surgeon: Tonny Bollman, MD;  Location: [facility] INVASIVE CV LAB;  Service: Cardiovascular;  Laterality: N/A;  . CARDIAC CATHETERIZATION N/A 11/25/2015   Procedure: Coronary Stent Intervention;  Surgeon: Tonny Bollman, MD;  Location: [facility] INVASIVE CV LAB;  Service: Cardiovascular;  Laterality: N/A;  . CARDIAC CATHETERIZATION N/A 06/19/2016   Procedure: Left Heart Cath and Cors/Grafts  Angiography;  Surgeon: Peter M Swaziland, MD;  Location: [facility] INVASIVE CV LAB;  Service: Cardiovascular;  Laterality: N/A;  . CESAREAN SECTION  1982; 1984  . COLONOSCOPY    . CORONARY ANGIOPLASTY WITH STENT PLACEMENT  11/11/2013   Drug-eluting stent in LCx  . CORONARY ARTERY BYPASS GRAFT N/A 09/22/2014   Procedure: CORONARY ARTERY BYPASS GRAFTING (CABG);  Surgeon: Purcell Nails, MD;  Location: [facility] OR;  Service: Open Heart Surgery;  Laterality: N/A;  Times 2 using left internal mammary artery and  endoscopically harvested right saphenous vein  . ESOPHAGOGASTRODUODENOSCOPY    . GASTRIC RESECTION  1990's   bezoar; "for reflux"  . LAPAROSCOPIC CHOLECYSTECTOMY  2000  . STERNAL WOUND DEBRIDEMENT N/A 11/23/2014   Procedure: SUPERFICIAL STERNAL WOUND DEBRIDEMENT;  Surgeon: Purcell Nails, MD;  Location: [facility] OR;  Service: Thoracic;  Laterality: N/A;  . TEE WITHOUT CARDIOVERSION N/A 09/22/2014   Procedure: TRANSESOPHAGEAL ECHOCARDIOGRAM (TEE);  Surgeon: Purcell Nails, MD;  Location: [facility] OR;  Service: Open Heart Surgery;  Laterality: N/A;    Current Outpatient Prescriptions  Medication Sig Dispense Refill  . albuterol (PROVENTIL HFA;VENTOLIN HFA) 108 (90 BASE) MCG/ACT inhaler Inhale 2 puffs into the lungs every 6 (six) hours as needed for wheezing or shortness of breath.    Marland Kitchen aspirin EC 81 MG tablet Take 81 mg by mouth daily.    Marland Kitchen atorvastatin (LIPITOR) 40 MG tablet Take 1 tablet (40 mg total) by mouth daily at 6 PM. 90 tablet 3  . carvedilol (COREG) 3.125 MG tablet Take 3.125 mg by mouth 2 (two) times daily with a meal.    . fentaNYL (DURAGESIC - DOSED MCG/HR) 25 MCG/HR patch Place 25 mcg onto the skin every other day.    . Fluticasone-Salmeterol (ADVAIR) 100-50 MCG/DOSE AEPB Inhale 1 puff into the lungs 2 (two) times daily as needed (shortness of breath).     . insulin aspart (NOVOLOG FLEXPEN) 100 UNIT/ML FlexPen Inject 0-5 Units into the skin daily. Based on sliding scale    . insulin glargine (LANTUS) 100 unit/mL SOPN Inject 50-55 Units into the skin See admin instructions. Inject 55 units subcutaneously every morning and 50 units at night    . lisinopril (PRINIVIL,ZESTRIL) 2.5 MG tablet Take 1 tablet (2.5 mg total) by mouth daily. 90 tablet 3  . nitroGLYCERIN (NITROSTAT) 0.4 MG SL tablet Place 1 tablet (0.4 mg total) under the tongue every 5 (five) minutes as needed for chest pain. 25 tablet 3  . ticagrelor (BRILINTA) 90 MG TABS tablet Take 1 tablet (90 mg total) by mouth 2 (two) times daily.  180 tablet 3  . tiotropium (SPIRIVA) 18 MCG inhalation capsule Place 18 mcg into inhaler and inhale daily as needed (shortness of breath).     . traZODone (DESYREL) 50 MG tablet Take 50 mg by mouth at bedtime.     No current facility-administered medications for this visit.     Allergies:   Wellbutrin [bupropion]   Social History:  The patient  reports that she has been smoking Cigarettes.  She has a 82.00 pack-year smoking history. She quit smokeless tobacco use about 10 years ago. Her smokeless tobacco use included Chew. She reports that she does not drink alcohol or use drugs.   Family History:  The patient's  She was adopted. Family history is unknown by patient.   ROS:  Please see the history of present illness.  All other systems are reviewed and negative.   PHYSICAL EXAM: VS:  BP 120/70  Pulse 84   Ht 5\' 2"  (1.575 m)   Wt 182 lb 6.4 oz (82.7 kg)   BMI 33.36 kg/m  , BMI Body mass index is 33.36 kg/m. GEN: Well nourished, well developed, in no acute distress  HEENT: normal  Neck: no JVD, no masses. No carotid bruits Cardiac: RRR without murmur or gallop                Respiratory:  clear to auscultation bilaterally, normal work of breathing GI: soft, nontender, nondistended, + BS MS: no deformity or atrophy  Ext: no pretibial edema, pedal pulses 2+= bilaterally Skin: warm and dry, no rash Neuro:  Strength and sensation are intact Psych: euthymic mood, full affect  EKG:  EKG is not ordered today.  Recent Labs: 11/25/2015: Magnesium 1.9 12/06/2015: Brain Natriuretic Peptide 36.2 07/28/2016: ALT 49; BUN 12; Creatinine, Ser 0.81; Hemoglobin 17.1; Platelets 186; Potassium 4.3; Sodium 133   Lipid Panel     Component Value Date/Time   CHOL 140 01/31/2016 0743   TRIG 283 (H) 01/31/2016 0743   HDL 31 (L) 01/31/2016 0743   CHOLHDL 4.5 01/31/2016 0743   VLDL 57 (H) 01/31/2016 0743   LDLCALC 52 01/31/2016 0743   LDLDIRECT 152.0 04/20/2015 1450     Wt Readings from Last 3  Encounters:  08/23/16 182 lb 6.4 oz (82.7 kg)  06/18/16 190 lb 0.6 oz (86.2 kg)  05/14/16 189 lb 6.4 oz (85.9 kg)     Cardiac Studies Reviewed: Cardiac Cath 06-19-2016: Conclusion     Prox Cx to Mid Cx lesion, 0 %stenosed at prior stent site.  Prox LAD lesion, 80 %stenosed.  Ost 1st Sept lesion, 100 %stenosed.  Mid RCA lesion, 100 %stenosed.  SVG to ther RCA is 100% occluded.  LIMA to the LAD is widely patent  Ost 2nd Mrg to 2nd Mrg lesion, 35 %stenosed.  There is mild left ventricular systolic dysfunction.  LV end diastolic pressure is normal.  The left ventricular ejection fraction is 45-50% by visual estimate.  1st Diag lesion, 90 %stenosed.   1. Severe 2 vessel obstructive CAD 2. Patent stents in the proximal to mid LCx 3. Patent LIMA to the LAD 4. Occluded SVG to RCA. This is chronic 5. Mild LV dysfunction. 6. Normal LV EDP  Plan: potential areas of ischemia include the chronic occlusion of the RCA and the first diagonal. These findings are unchanged from prior cardiac cath so are unlikely to explain her acute chest pain symptoms. Recommend continued medical management.      ASSESSMENT AND PLAN: 1.  CAD, native vessel, with angina: The patient has intermittent episodes of chest pain which I think are related to musculoskeletal pain. She continues on a good medical regimen for her coronary artery disease. Her recent heart catheterization study is reviewed and demonstrates continued patency of the stented segment in the left circumflex and well collateralized RCA distribution.  2. Essential hypertension: Blood pressure is controlled on her current medications. Medicines are reviewed and no changes are recommended today.  3. Hyperlipidemia: Treated with atorvastatin 40 mg. Lipids are followed by her primary care physician.  4. Tobacco abuse: Cessation counseling is done.  Current medicines are reviewed with the patient today.  The patient does not have  concerns regarding medicines.  Labs/ tests ordered today include:  No orders of the defined types were placed in this encounter.  Disposition:   FU 6 months  Signed, Tonny Bollmanooper, Isidora Laham, MD  08/23/2016 5:10 PM    [facility] [address], [place], [place]  [address] Phone: [phone]; Fax: [phone]

## 2016-09-07 ENCOUNTER — Other Ambulatory Visit: Payer: Self-pay | Admitting: Physician Assistant

## 2016-09-07 DIAGNOSIS — I255 Ischemic cardiomyopathy: Secondary | ICD-10-CM

## 2016-09-07 DIAGNOSIS — I10 Essential (primary) hypertension: Secondary | ICD-10-CM

## 2016-12-05 ENCOUNTER — Other Ambulatory Visit: Payer: Self-pay | Admitting: Physician Assistant

## 2016-12-05 DIAGNOSIS — E785 Hyperlipidemia, unspecified: Secondary | ICD-10-CM

## 2017-03-23 IMAGING — DX DG CHEST 2V
2 series · 2 of 2 positions shown · non-contrast
Comparison: 12/06/2014

CLINICAL DATA: Flu like symptoms for the past few days. Difficulty
breathing this morning.

EXAM:
CHEST  2 VIEW

[chest lat]
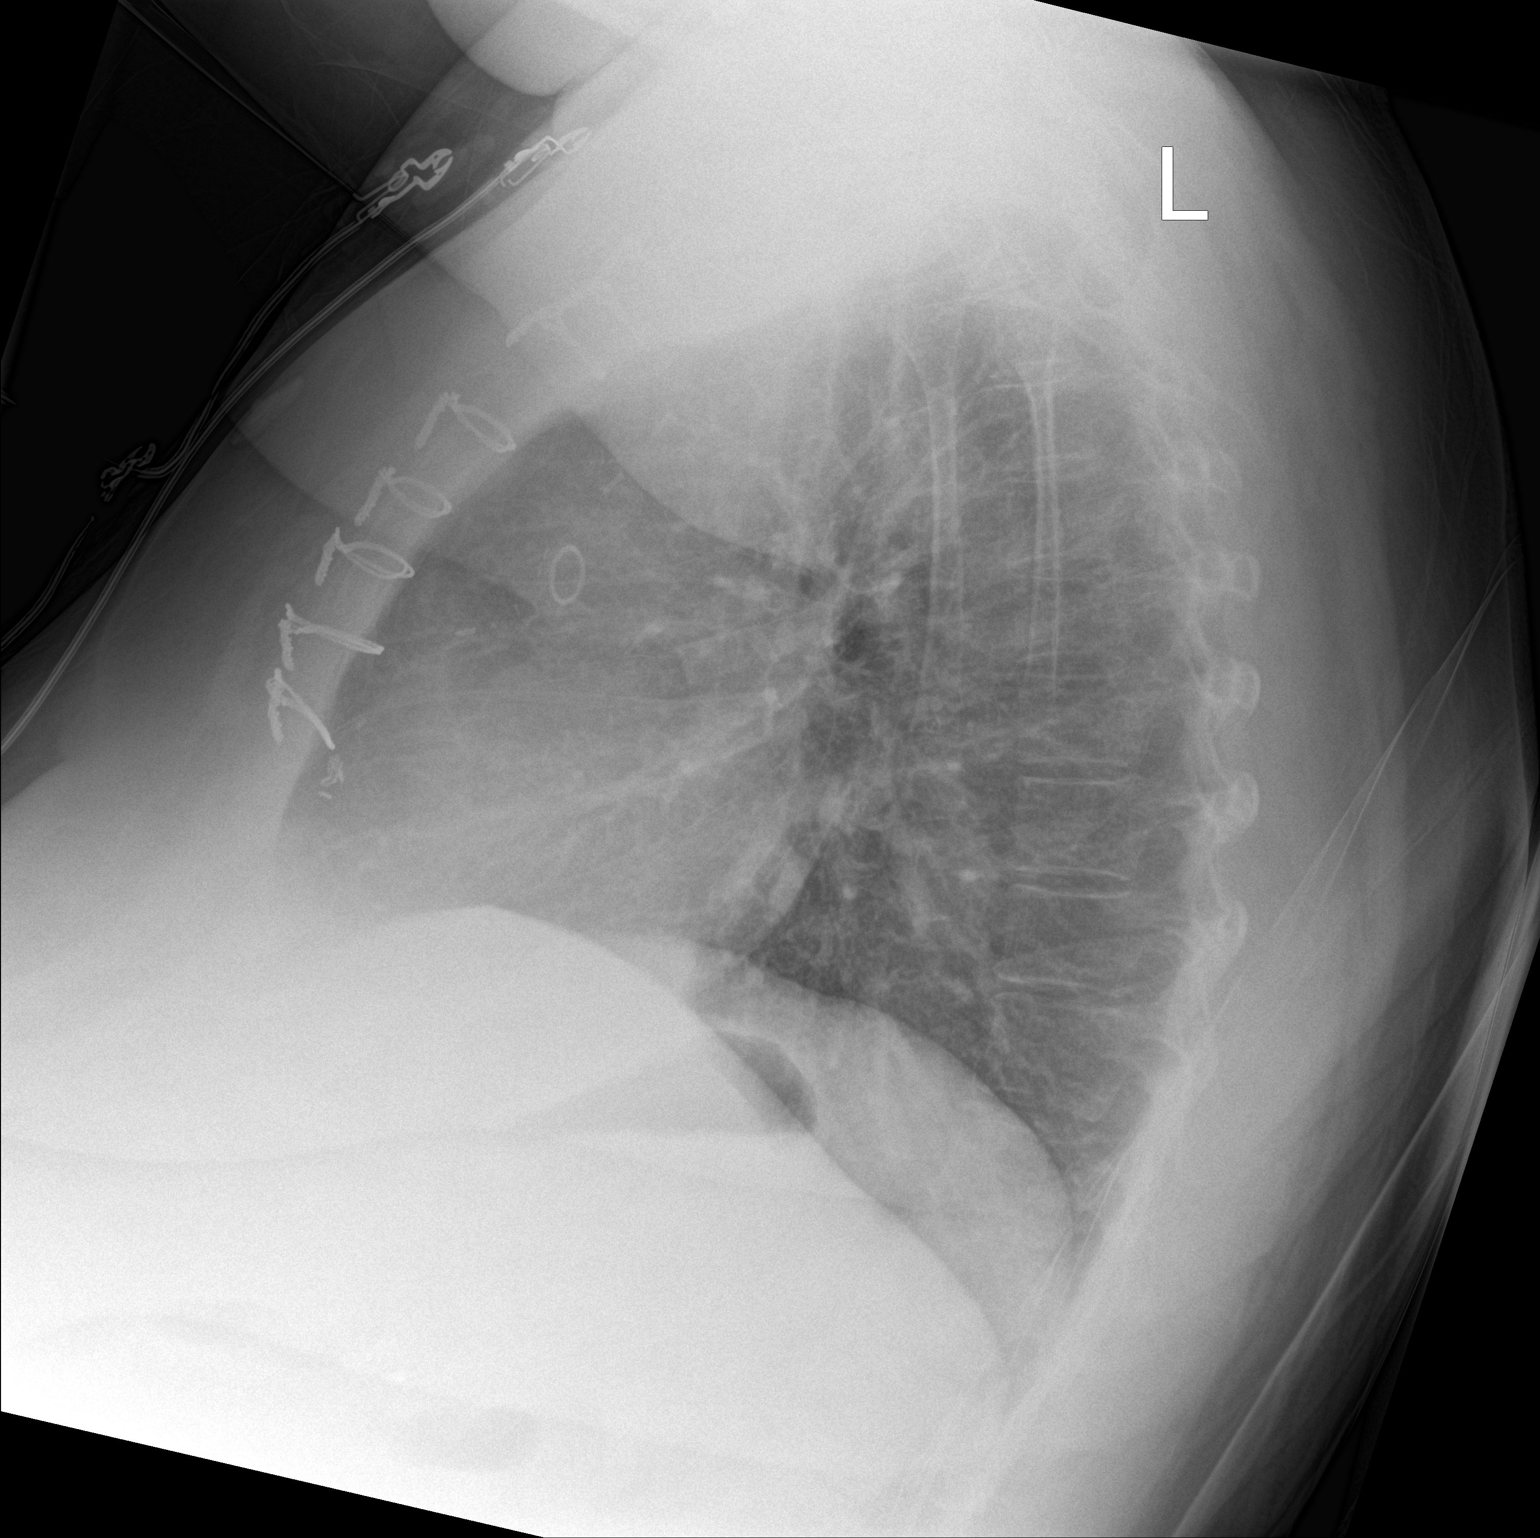

[chest ap]
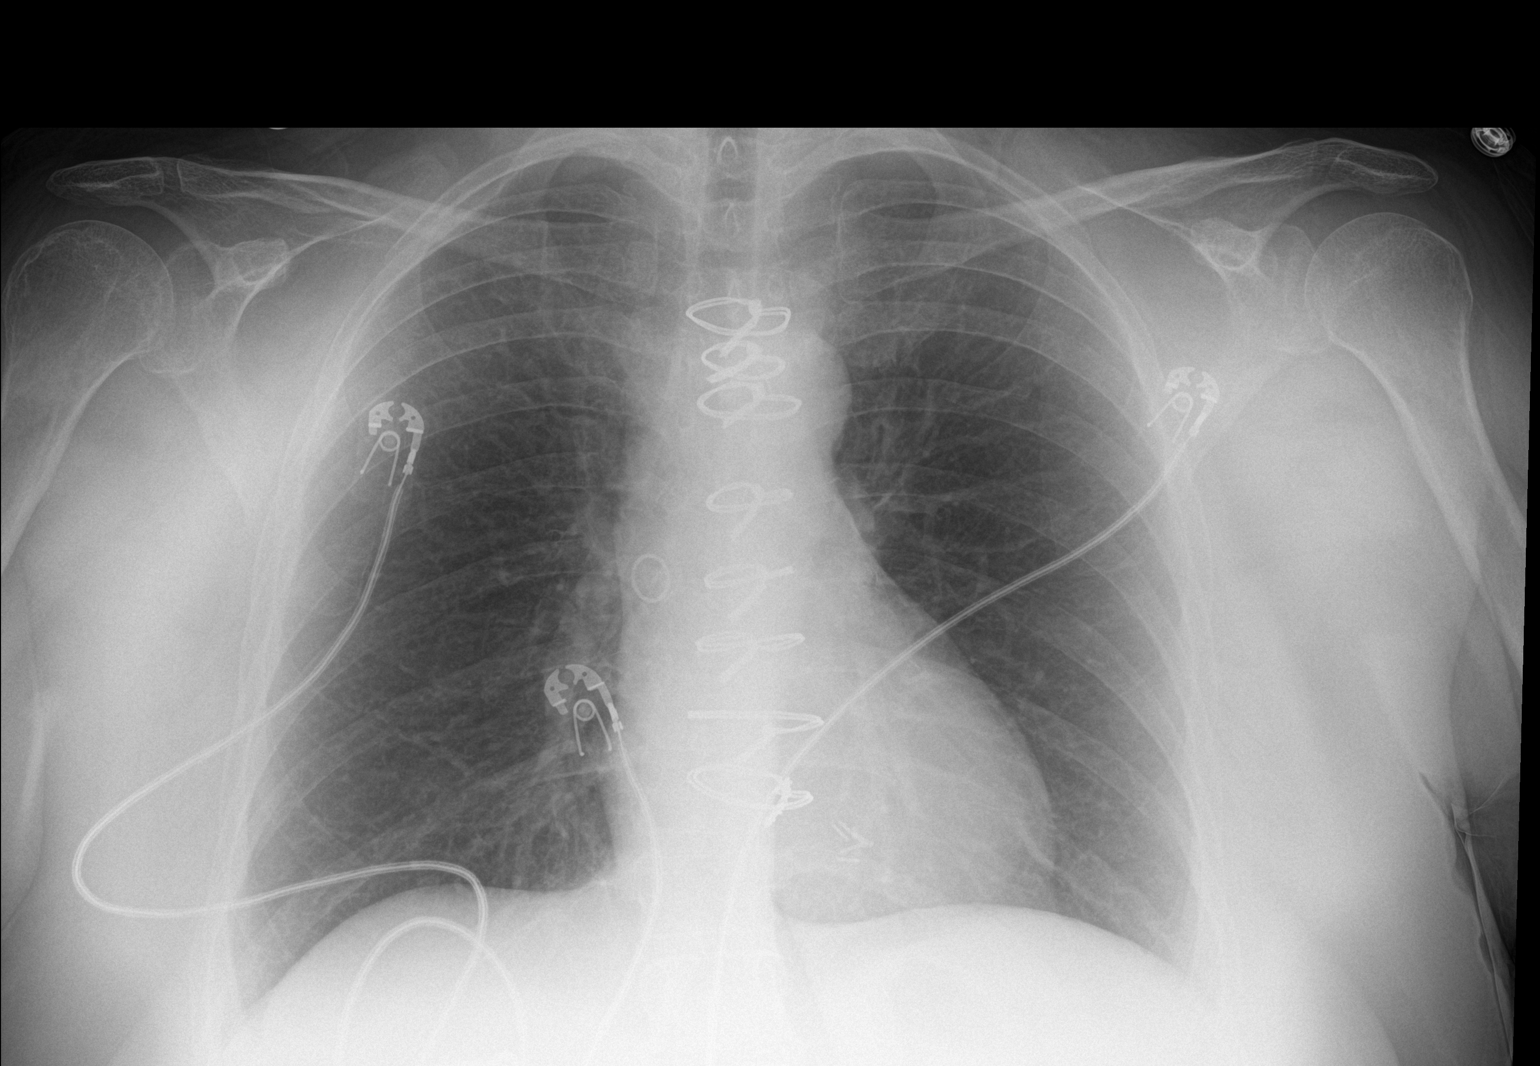

[2 of 2 positions shown; findings below may reference images not displayed]

FINDINGS: The cardiac silhouette, mediastinal and hilar contours are normal
and stable. Stable surgical changes from bypass surgery. The lungs
are clear. No pleural effusion. The bony thorax is intact.
IMPRESSION: No acute cardiopulmonary findings.

## 2017-04-30 ENCOUNTER — Encounter: Payer: Self-pay | Admitting: *Deleted

## 2017-05-20 ENCOUNTER — Ambulatory Visit (INDEPENDENT_AMBULATORY_CARE_PROVIDER_SITE_OTHER): Payer: BLUE CROSS/BLUE SHIELD | Admitting: Cardiovascular Disease

## 2017-05-20 ENCOUNTER — Encounter: Payer: Self-pay | Admitting: Cardiovascular Disease

## 2017-05-20 VITALS — BP 116/70 | HR 84 | Ht 63.0 in | Wt 208.4 lb

## 2017-05-20 DIAGNOSIS — I2510 Atherosclerotic heart disease of native coronary artery without angina pectoris: Secondary | ICD-10-CM | POA: Diagnosis not present

## 2017-05-20 DIAGNOSIS — R0789 Other chest pain: Secondary | ICD-10-CM | POA: Diagnosis not present

## 2017-05-20 DIAGNOSIS — I255 Ischemic cardiomyopathy: Secondary | ICD-10-CM

## 2017-05-20 DIAGNOSIS — E785 Hyperlipidemia, unspecified: Secondary | ICD-10-CM

## 2017-05-20 DIAGNOSIS — I10 Essential (primary) hypertension: Secondary | ICD-10-CM

## 2017-05-20 MED ORDER — NITROGLYCERIN 0.4 MG SL SUBL: 0.4000 mg | SUBLINGUAL_TABLET | SUBLINGUAL | 3 refills | Status: DC | PRN

## 2017-05-20 MED ORDER — TICAGRELOR 90 MG PO TABS: 90.0000 mg | ORAL_TABLET | Freq: Two times a day (BID) | ORAL | 3 refills | Status: DC

## 2017-05-20 MED ORDER — ATORVASTATIN CALCIUM 40 MG PO TABS: ORAL_TABLET | ORAL | 3 refills | Status: DC

## 2017-05-20 MED ORDER — LISINOPRIL 2.5 MG PO TABS: 2.5000 mg | ORAL_TABLET | Freq: Every day | ORAL | 3 refills | Status: DC

## 2017-05-20 MED ORDER — CARVEDILOL 3.125 MG PO TABS: 3.1250 mg | ORAL_TABLET | Freq: Two times a day (BID) | ORAL | 3 refills | Status: DC

## 2017-05-20 NOTE — Patient Instructions (Signed)
Medication Instructions:  Your provider recommends that you continue on your current medications as directed. Please refer to the Current Medication list given to you today.    Labwork: None  Testing/Procedures: None  Follow-Up: Your provider wants you to follow-up in: 6 months with Dr. Earmon Phoenix assistant, Lorin Picket. You will receive a reminder letter in the mail two months in advance. If you don't receive a letter, please call our office to schedule the follow-up appointment.   Your provider wants you to follow-up in: 1 year with Dr. Excell Seltzer. You will receive a reminder letter in the mail two months in advance. If you don't receive a letter, please call our office to schedule the follow-up appointment.     Any Other Special Instructions Will Be Listed Below (If Applicable).     If you need a refill on your cardiac medications before your next appointment, please call your pharmacy.    For your  leg edema you  should do  the following 1. Leg elevation - I recommend the Lounge Dr. Leg rest.  See below for details  2. Salt restriction  -  Use potassium chloride instead of regular salt as a salt substitute. 3. Walk regularly 4. Compression hose - [facility] [employer] [facility] 5. Weight loss     Go to [email or web address]

## 2017-05-20 NOTE — Progress Notes (Signed)
Cardiology Office Note Date:  05/20/2017   ID:  Sara Hobbs, DOB 10-Dec-1960, MRN [id number]  PCP:  Lavell Islam, MD  Cardiologist:  Tonny Bollman, MD    Chief Complaint  Patient presents with  . Follow-up    cad     History of Present Illness: Sara Hobbs is a 56 y.o. female who presents for  follow-up evaluation. The patient has a complex medical history with coronary artery disease and remote CABG, COPD, diabetes, hypertension, hyperlipidemia, and chronic pain syndrome. She presented 11/25/2015 with chest pain, shortness of breath, and PEA arrest in the emergency room. She was resuscitated and underwent emergency cardiac catheterization showing three-vessel coronary artery disease with severe stenosis of the proximal left circumflex. She had patency of the LIMA to LAD graft and chronic occlusion of the vein graft to PDA. She underwent PCI of the native left circumflex without complication.   She has occasional chest pains, generally when she's dealing with her chronic pain syndrome. She's not having any chest pain reminiscent of the symptoms she had prior to her heart attack. The patient is here with her husband today. She still worries quite a bit about her heart. She continues to smoke heavily. She has a lot of stress and anxiety. She complains of leg swelling but this is only intermittent. No orthopnea or PND. No cough.  Past Medical History:  Diagnosis Date  . Angina pectoris (HCC)   . Anxiety   . Arthritis    "hands, neck, back, knees, hips, ankles" (11/22/2014)  . Asthma   . CAD (coronary artery disease)    a. 09/2014: LIMA to LAD, SVG to PDA, EVH via right thigh b. Cardiac Arrest 11/2015: patent LIMA-LAD, CTO if SVG-PDA, 99% stenosis of Prox-Mid Cx w/ DES placed  . CAD, multiple vessel   . Cardiac arrest (HCC) 11/25/2015  . Chest tightness   . CHF (congestive heart failure) (HCC)   . Chronic lower back pain   . Chronic shoulder pain    "right; I've got bad  rotator cuff"  . COPD (chronic obstructive pulmonary disease) (HCC)   . Depression   . Diabetes mellitus with complication (HCC)   . GERD (gastroesophageal reflux disease)   . Headache    "monthly" (11/22/2014)  . History of blood transfusion    "when I had my 1st youngun; when I had MI"  . History of hiatal hernia   . History of stomach ulcers   . Hyperlipidemia   . Hypothyroidism    PMH:  . IDDM (insulin dependent diabetes mellitus) (HCC)   . Incidental lung nodule, > 3mm and < 8mm 11/22/2014   Small non-calcified nodules left lung  . Myocardial infarction (HCC) 11/2013   PCI w/ drug eluting stent LCx  . Neuropathy due to secondary diabetes (HCC)   . Wears glasses     Past Surgical History:  Procedure Laterality Date  . ABDOMINAL HYSTERECTOMY  1990's  . APPLICATION OF WOUND VAC N/A 11/23/2014   Procedure: POSSIBLE APPLICATION OF WOUND VAC;  Surgeon: Purcell Nails, MD;  Location: [facility] OR;  Service: Thoracic;  Laterality: N/A;  . BREAST SURGERY Left 1990's   breast duct surgery  . CARDIAC CATHETERIZATION  09/01/14  . CARDIAC CATHETERIZATION N/A 11/25/2015   Procedure: Left Heart Cath and Cors/Grafts Angiography;  Surgeon: Tonny Bollman, MD;  Location: [facility] INVASIVE CV LAB;  Service: Cardiovascular;  Laterality: N/A;  . CARDIAC CATHETERIZATION N/A 11/25/2015   Procedure: Coronary Stent Intervention;  Surgeon: Tonny Bollman,  MD;  Location: [facility] INVASIVE CV LAB;  Service: Cardiovascular;  Laterality: N/A;  . CARDIAC CATHETERIZATION N/A 06/19/2016   Procedure: Left Heart Cath and Cors/Grafts Angiography;  Surgeon: Peter M Swaziland, MD;  Location: [facility] INVASIVE CV LAB;  Service: Cardiovascular;  Laterality: N/A;  . CESAREAN SECTION  1982; 1984  . COLONOSCOPY    . CORONARY ANGIOPLASTY WITH STENT PLACEMENT  11/11/2013   Drug-eluting stent in LCx  . CORONARY ARTERY BYPASS GRAFT N/A 09/22/2014   Procedure: CORONARY ARTERY BYPASS GRAFTING (CABG);  Surgeon: Purcell Nails, MD;  Location: [facility] OR;   Service: Open Heart Surgery;  Laterality: N/A;  Times 2 using left internal mammary artery and endoscopically harvested right saphenous vein  . ESOPHAGOGASTRODUODENOSCOPY    . GASTRIC RESECTION  1990's   bezoar; "for reflux"  . LAPAROSCOPIC CHOLECYSTECTOMY  2000  . STERNAL WOUND DEBRIDEMENT N/A 11/23/2014   Procedure: SUPERFICIAL STERNAL WOUND DEBRIDEMENT;  Surgeon: Purcell Nails, MD;  Location: [facility] OR;  Service: Thoracic;  Laterality: N/A;  . TEE WITHOUT CARDIOVERSION N/A 09/22/2014   Procedure: TRANSESOPHAGEAL ECHOCARDIOGRAM (TEE);  Surgeon: Purcell Nails, MD;  Location: [facility] OR;  Service: Open Heart Surgery;  Laterality: N/A;    Current Outpatient Prescriptions  Medication Sig Dispense Refill  . albuterol (PROVENTIL HFA;VENTOLIN HFA) 108 (90 BASE) MCG/ACT inhaler Inhale 2 puffs into the lungs every 6 (six) hours as needed for wheezing or shortness of breath.    Marland Kitchen aspirin EC 81 MG tablet Take 81 mg by mouth daily.    Marland Kitchen atorvastatin (LIPITOR) 40 MG tablet TAKE ONE TABLET BY MOUTH ONCE DAILY AT 6 PM 90 tablet 3  . carvedilol (COREG) 3.125 MG tablet Take 1 tablet (3.125 mg total) by mouth 2 (two) times daily with a meal. 180 tablet 3  . fentaNYL (DURAGESIC - DOSED MCG/HR) 25 MCG/HR patch Place 25 mcg onto the skin every other day.    . Fluticasone-Salmeterol (ADVAIR) 100-50 MCG/DOSE AEPB Inhale 1 puff into the lungs 2 (two) times daily as needed (shortness of breath).     . insulin aspart (NOVOLOG FLEXPEN) 100 UNIT/ML FlexPen Inject 0-5 Units into the skin daily. Based on sliding scale    . insulin glargine (LANTUS) 100 unit/mL SOPN Inject 50 Units into the skin 2 (two) times daily.     Marland Kitchen lisinopril (PRINIVIL,ZESTRIL) 2.5 MG tablet Take 1 tablet (2.5 mg total) by mouth daily. 90 tablet 3  . nitroGLYCERIN (NITROSTAT) 0.4 MG SL tablet Place 1 tablet (0.4 mg total) under the tongue every 5 (five) minutes as needed for chest pain. 25 tablet 3  . ticagrelor (BRILINTA) 90 MG TABS tablet Take 1 tablet  (90 mg total) by mouth 2 (two) times daily. 180 tablet 3  . tiotropium (SPIRIVA) 18 MCG inhalation capsule Place 18 mcg into inhaler and inhale daily as needed (shortness of breath).      No current facility-administered medications for this visit.     Allergies:   Wellbutrin [bupropion]   Social History:  The patient  reports that she has been smoking Cigarettes.  She has a 82.00 pack-year smoking history. She quit smokeless tobacco use about 11 years ago. Her smokeless tobacco use included Chew. She reports that she does not drink alcohol or use drugs.   Family History:  The patient's  She was adopted. Family history is unknown by patient.    ROS:  Please see the history of present illness.  Otherwise, review of systems is positive for chest pain, leg swelling,  depression, back pain, muscle pain, dizziness, excessive sweating, excessive fatigue, anxiety, balance problems, headaches.  All other systems are reviewed and negative.   PHYSICAL EXAM: VS:  BP 116/70   Pulse 84   Ht  (1.6 m)   Wt 94.5 kg (208 lb 6.4 oz)   BMI 36.92 kg/m  , BMI Body mass index is 36.92 kg/m. GEN: pleasant obese woman, in no acute distress  HEENT: normal  Neck: no JVD, no masses. No carotid bruits Cardiac: RRR without murmur or gallop                Respiratory:  clear to auscultation bilaterally, normal work of breathing GI: soft, nontender, nondistended, + BS MS: no deformity or atrophy  Ext: no pretibial edema Skin: warm and dry, no rash Neuro:  Strength and sensation are intact Psych: euthymic mood, full affect  EKG:  EKG is ordered today. The ekg ordered today shows normal sinus rhythm 84 bpm, age-indeterminate inferior.  Recent Labs: 07/28/2016: ALT 49; BUN 12; Creatinine, Ser 0.81; Hemoglobin 17.1; Platelets 186; Potassium 4.3; Sodium 133   Lipid Panel     Component Value Date/Time   CHOL 140 01/31/2016 0743   TRIG 283 (H) 01/31/2016 0743   HDL 31 (L) 01/31/2016 0743   CHOLHDL 4.5  01/31/2016 0743   VLDL 57 (H) 01/31/2016 0743   LDLCALC 52 01/31/2016 0743   LDLDIRECT 152.0 04/20/2015 1450      Wt Readings from Last 3 Encounters:  05/20/17 94.5 kg (208 lb 6.4 oz)  08/23/16 82.7 kg (182 lb 6.4 oz)  06/18/16 86.2 kg (190 lb 0.6 oz)     ASSESSMENT AND PLAN: 1.  CAD, native vessel, with angina: Overall the patient appears stable. She had a follow-up cardiac catheterization 7 months after her original PCI procedure the demonstrated stable anatomy. This study is reviewed today. Her medications are reviewed. We discussed discontinuation of either aspirin or brilinta. The patient feels strongly that she doesn't want to stop either one of these medications. Will continue the same and plan on seeing her back in 6 months.  2. Hyperlipidemia: The patient is treated with atorvastatin 40 mg daily.  3. Hypertension: Treated with carvedilol and lisinopril at low doses.  4. Tobacco abuse: Cessation counseling done  Current medicines are reviewed with the patient today.  The patient does not have concerns regarding medicines.  Labs/ tests ordered today include:   Orders Placed This Encounter  Procedures  . EKG 12-Lead    Disposition:   FU 6 months with Tereso Newcomer, PA-C and one year with me.   Enzo Bi, MD  05/20/2017 5:47 PM    [facility] [address] [place], [place], [place]  [address] Phone: [phone]; Fax: [phone]

## 2017-10-16 ENCOUNTER — Other Ambulatory Visit: Payer: Self-pay | Admitting: Physician Assistant

## 2017-10-16 DIAGNOSIS — E785 Hyperlipidemia, unspecified: Secondary | ICD-10-CM

## 2017-11-18 ENCOUNTER — Ambulatory Visit (INDEPENDENT_AMBULATORY_CARE_PROVIDER_SITE_OTHER): Payer: BLUE CROSS/BLUE SHIELD | Admitting: Physician Assistant

## 2017-11-18 ENCOUNTER — Encounter: Payer: Self-pay | Admitting: Physician Assistant

## 2017-11-18 VITALS — BP 108/70 | HR 91 | Ht 63.0 in | Wt 216.4 lb

## 2017-11-18 DIAGNOSIS — E785 Hyperlipidemia, unspecified: Secondary | ICD-10-CM

## 2017-11-18 DIAGNOSIS — I10 Essential (primary) hypertension: Secondary | ICD-10-CM

## 2017-11-18 DIAGNOSIS — I25118 Atherosclerotic heart disease of native coronary artery with other forms of angina pectoris: Secondary | ICD-10-CM | POA: Diagnosis not present

## 2017-11-18 DIAGNOSIS — Z720 Tobacco use: Secondary | ICD-10-CM

## 2017-11-18 DIAGNOSIS — R0602 Shortness of breath: Secondary | ICD-10-CM

## 2017-11-18 MED ORDER — TICAGRELOR 60 MG PO TABS: 60.0000 mg | ORAL_TABLET | Freq: Two times a day (BID) | ORAL | 3 refills | Status: DC

## 2017-11-18 MED ORDER — NITROGLYCERIN 0.4 MG SL SUBL: 0.4000 mg | SUBLINGUAL_TABLET | SUBLINGUAL | 3 refills | Status: DC | PRN

## 2017-11-18 NOTE — Patient Instructions (Addendum)
Medication Instructions:  1. FINISH YOUR CURRENT BOTTLE OF BRILINTA  2. ONCE YOU FINISHED YOUR CURRENT BOTTLE OF BRILINTA; YOU WILL START ON BRILINTA 60 MG 1 TABLET TWICE DAILY; NEW RX HAS BEEN SENT IN FOR THE 60 MG TABLET  3. A REFILL FOR NITROGLYCERIN HAS BEEN SENT IN  Labwork: TODAY PRO BNP  Testing/Procedures: NONE ORDERED TODAY  Follow-Up: DR. Excell Seltzer 05/2018; WE WILL SEND OUT A REMINDER LETTER TO HAVE YOU CALL THE OFFICE AND MAKE AN APPT   Any Other Special Instructions Will Be Listed Below (If Applicable).     If you need a refill on your cardiac medications before your next appointment, please call your pharmacy.

## 2017-11-18 NOTE — Progress Notes (Signed)
Cardiology Office Note:    Date:  11/18/2017   ID:  Sara Hobbs, DOB August 31, 1961, MRN [id number]  PCP:  Lavell Islam, MD  Cardiologist:  Tonny Bollman, MD   Referring MD: Andi Devon Laban Emperor, MD   Chief Complaint  Patient presents with  . Follow-up    CAD    History of Present Illness:    Sara Hobbs is a 57 y.o. female with CAD s/p CABG, COPD, diabetes, HTN, HL, chronic pain.  She had dehiscence of her sternum post CABG as well as fat necrosis and required wound vac for a while.  She was admitted in 3/17 with chest pain and while in triage she became unresponsive and developed PEA arrest.  LHC demonstrated 3 vessel CAD with severe stenosis of the proximal LCx, patent LIMA-LAD and chronic occlusion of the SVG-PDA. She underwent PCI with DES to native LCx.  Nuclear stress test in 8/17 demonstrated moderate sized inferior wall infarct from apex to the base with moderate peri-infarct ischemia, EF 49%.  This was felt to be consistent with her known anatomy (occluced RCA and an occluded S-RCA with L and R collaterals to the distal RCA).  Medical therapy was continued.  She was then admitted in October 2017 with chest pain.  Cardiac catheterization demonstrated an occluded SVG-RPDA and a patent LIMA-distal LAD.  Stent in the LCx was also patent.  Medical therapy was continued.  She was last seen by Dr. Excell Seltzer 9/18.  Sara Hobbs returns for follow-up.  She is here today with her husband.  Since last seen, she continues to have periodic chest discomfort.  She attributes this to her chronic pain issues.  She denies any symptoms reminiscent of her previous angina.  She does have left chest discomfort at rest and with exertion.  She takes nitroglycerin with relief at times.  She denies any radiating symptoms or associated dyspnea.  She denies associated diaphoresis.  She does note chronic dyspnea with exertion.  She sleeps on 2 pillows.  Recently, she has noted lower extremity swelling.  She denies  PND.  She denies syncope.  Prior CV studies:   The following studies were reviewed today:  Cardiac catheterization 06/19/16  LM normal LAD proximal 80; D1 90; first septal branch ostial 100 LCx proximal stent patent; OM2 35 RCA mid 100 SVG-RPDA 100 LIMA-distal LAD patent EF 45-50 Medical therapy  Nuclear Stress Test 04/23/16 Moderate sized inferior wall infarct from apex to base with moderate peri infarct ischemia EF 49% with inferobasal hypokinesis   Echo 11/26/15 Distal septal and posterior lateral HK, EF 45-50%, mild MR   LHC 11/25/15 LM normal LAD proximal 80%, D1 80%, ostial first septal branch 100% LCx proximal 99% ISR RCA mid 100% with left-right collaterals SVG-RPDA 100% LIMA-LAD patent EF 45-50%, distal inferior akinesis, basal inferior hypokinesis, moderate MR PCI: 2.75 x 12 mm Promus DES to the LCx   Carotid US 1/16 Bilateral - 1% to 39% ICA stenosis   Past Medical History:  Diagnosis Date  . Angina pectoris (HCC)   . Anxiety   . Arthritis    "hands, neck, back, knees, hips, ankles" (11/22/2014)  . Asthma   . CAD (coronary artery disease)    a. 09/2014: LIMA to LAD, SVG to PDA, EVH via right thigh b. Cardiac Arrest 11/2015: patent LIMA-LAD, CTO if SVG-PDA, 99% stenosis of Prox-Mid Cx w/ DES placed  . CAD, multiple vessel   . Cardiac arrest (HCC) 11/25/2015  . Chest tightness   . CHF (  congestive heart failure) (HCC)   . Chronic lower back pain   . Chronic shoulder pain    "right; I've got bad rotator cuff"  . COPD (chronic obstructive pulmonary disease) (HCC)   . Depression   . Diabetes mellitus with complication (HCC)   . GERD (gastroesophageal reflux disease)   . Headache    "monthly" (11/22/2014)  . History of blood transfusion    "when I had my 1st youngun; when I had MI"  . History of hiatal hernia   . History of stomach ulcers   . Hyperlipidemia   . Hypothyroidism    PMH:  . IDDM (insulin dependent diabetes mellitus) (HCC)   . Incidental lung  nodule, > 3mm and < 8mm 11/22/2014   Small non-calcified nodules left lung  . Myocardial infarction (HCC) 11/2013   PCI w/ drug eluting stent LCx  . Neuropathy due to secondary diabetes (HCC)   . Wears glasses    Surgical Hx: The patient  has a past surgical history that includes Cesarean section (1982; 1984); Gastric resection (1990's); Coronary angioplasty with stent (11/11/2013); Breast surgery (Left, 1990's); Colonoscopy; Esophagogastroduodenoscopy; Coronary artery bypass graft (N/A, 09/22/2014); TEE without cardioversion (N/A, 09/22/2014); Laparoscopic cholecystectomy (2000); Abdominal hysterectomy (1990's); Cardiac catheterization (09/01/14); Sternal wound debridement (N/A, 11/23/2014); Application if wound vac (N/A, 11/23/2014); Cardiac catheterization (N/A, 11/25/2015); Cardiac catheterization (N/A, 11/25/2015); and Cardiac catheterization (N/A, 06/19/2016).   Current Medications: Current Meds  Medication Sig  . albuterol (PROVENTIL HFA;VENTOLIN HFA) 108 (90 BASE) MCG/ACT inhaler Inhale 2 puffs into the lungs every 6 (six) hours as needed for wheezing or shortness of breath.  Marland Kitchen aspirin EC 81 MG tablet Take 81 mg by mouth daily.  Marland Kitchen atorvastatin (LIPITOR) 40 MG tablet TAKE 1 TABLET BY MOUTH ONCE DAILY AT  6  PM  . carvedilol (COREG) 3.125 MG tablet Take 1 tablet (3.125 mg total) by mouth 2 (two) times daily with a meal.  . Fluticasone-Salmeterol (ADVAIR) 100-50 MCG/DOSE AEPB Inhale 1 puff into the lungs 2 (two) times daily as needed (shortness of breath).   . insulin aspart (NOVOLOG FLEXPEN) 100 UNIT/ML FlexPen Inject 0-5 Units into the skin daily. Based on sliding scale  . insulin glargine (LANTUS) 100 unit/mL SOPN Inject 50 Units into the skin 2 (two) times daily.   Marland Kitchen lisinopril (PRINIVIL,ZESTRIL) 2.5 MG tablet Take 1 tablet (2.5 mg total) by mouth daily.  . nitroGLYCERIN (NITROSTAT) 0.4 MG SL tablet Place 1 tablet (0.4 mg total) under the tongue every 5 (five) minutes as needed for chest pain.    . OXYCODONE ER PO Take 1 tablet by mouth every 6 (six) hours as needed (PAIN).  Marland Kitchen tiotropium (SPIRIVA) 18 MCG inhalation capsule Place 18 mcg into inhaler and inhale daily as needed (shortness of breath).   . [DISCONTINUED] ticagrelor (BRILINTA) 90 MG TABS tablet Take 1 tablet (90 mg total) by mouth 2 (two) times daily.     Allergies:   Wellbutrin [bupropion]   Social History   Tobacco Use  . Smoking status: Current Every Day Smoker    Packs/day: 2.00    Years: 41.00    Pack years: 82.00    Types: Cigarettes  . Smokeless tobacco: Former Neurosurgeon    Types: Chew    Quit date: 09/13/2005  Substance Use Topics  . Alcohol use: No    Alcohol/week: 0.0 oz  . Drug use: No     Family Hx: The patient's She was adopted. Family history is unknown by patient.  ROS:  Please see the history of present illness.    Review of Systems  Cardiovascular: Positive for chest pain and leg swelling.  Respiratory: Positive for cough and shortness of breath.   Musculoskeletal: Positive for back pain.  Neurological: Positive for dizziness and headaches.  Psychiatric/Behavioral: Positive for depression. The patient is nervous/anxious.    All other systems reviewed and are negative.   EKGs/Labs/Other Test Reviewed:    EKG:  EKG is  ordered today.  The ekg ordered today demonstrates normal sinus rhythm, heart rate 91, normal axis, interventricular conduction delay, poor R wave progression, nonspecific ST-T wave changes, QTC 415, no change from prior tracing  Recent Labs: No results found for requested labs within last 8760 hours.   Recent Lipid Panel Lab Results  Component Value Date/Time   CHOL 140 01/31/2016 07:43 AM   TRIG 283 (H) 01/31/2016 07:43 AM   HDL 31 (L) 01/31/2016 07:43 AM   CHOLHDL 4.5 01/31/2016 07:43 AM   LDLCALC 52 01/31/2016 07:43 AM   LDLDIRECT 152.0 04/20/2015 02:50 PM    Physical Exam:    VS:  BP 108/70   Pulse 91   Ht 5\' 3"  (1.6 m)   Wt 216 lb 6.4 oz (98.2 kg)   SpO2  97%   BMI 38.33 kg/m     Wt Readings from Last 3 Encounters:  11/18/17 216 lb 6.4 oz (98.2 kg)  05/20/17 208 lb 6.4 oz (94.5 kg)  08/23/16 182 lb 6.4 oz (82.7 kg)     Physical Exam  Constitutional: She is oriented to person, place, and time. She appears well-developed and well-nourished. No distress.  HENT:  Head: Normocephalic and atraumatic.  Neck: Neck supple.  Cardiovascular: Normal rate and regular rhythm.  No murmur heard. Pulmonary/Chest: Effort normal. She has no rales.  Abdominal: Soft.  Musculoskeletal: She exhibits no edema (I cannot appreciate significant pitting edema bilat).  Neurological: She is alert and oriented to person, place, and time.  Skin: Skin is warm and dry.    ASSESSMENT & PLAN:    #1.  Shortness of breath  She has chronic shortness of breath that is likely related to COPD and ongoing tobacco abuse.  However, she has noted lower extremity edema recently.  She is also notes a steady weight gain.  She does have an EF of 45-50%.  She is not on diuretic therapy.  -Obtain BNP  -Start Lasix if BNP significantly elevated  #2.  Coronary artery disease of native artery of native heart with stable angina pectoris [facility])  Status post CABG and subsequent DES to the LCx in March 2017.  RCA and SVG-PDA are known to be occluded with extensive collaterals.  Cardiac catheterization in October 2017 demonstrated stable anatomy with patent LIMA-LAD and patent LCx stent.  She has occasional chest discomfort but is sometimes relieved by nitroglycerin.  Her overall pattern of chest discomfort is unchanged.  I have recommended continued medical therapy.  -Continue aspirin, ticagrelor, statin, beta-blocker, ACE inhibitor  -Reduce dose of ticagrelor to 60 mg twice daily  #3.  Essential hypertension The patient's blood pressure is controlled on her current regimen.  Continue current therapy.   #4.  Hyperlipidemia, unspecified hyperlipidemia type LDL optimal on most recent lab  work.  Continue current Rx.    #5.  Tobacco abuse She has been advised of the need for cessation in the past.  Dispo:  Return in about 6 months (around 05/21/2018) for Routine Follow Up, w/ Dr. Excell Seltzer.   Medication Adjustments/Labs and  Tests Ordered: Current medicines are reviewed at length with the patient today.  Concerns regarding medicines are outlined above.  Tests Ordered: Orders Placed This Encounter  Procedures  . Pro b natriuretic peptide  . EKG 12-Lead   Medication Changes: Meds ordered this encounter  Medications  . ticagrelor (BRILINTA) 60 MG TABS tablet    Sig: Take 1 tablet (60 mg total) by mouth 2 (two) times daily.    Dispense:  180 tablet    Refill:  3    DOSE CHANGE  . nitroGLYCERIN (NITROSTAT) 0.4 MG SL tablet    Sig: Place 1 tablet (0.4 mg total) under the tongue every 5 (five) minutes as needed for chest pain.    Dispense:  25 tablet    Refill:  3    Signed, Tereso Newcomer, PA-C  11/18/2017 2:37 PM    [facility] [address] [place], [place], [place]  [address] Phone: [phone]; Fax: [phone]

## 2017-11-19 LAB — PRO B NATRIURETIC PEPTIDE: NT-PRO BNP: 69 pg/mL (ref 0–287)

## 2018-04-23 ENCOUNTER — Other Ambulatory Visit: Payer: Self-pay | Admitting: Cardiovascular Disease

## 2018-04-23 DIAGNOSIS — I255 Ischemic cardiomyopathy: Secondary | ICD-10-CM

## 2018-04-23 DIAGNOSIS — I10 Essential (primary) hypertension: Secondary | ICD-10-CM

## 2018-04-23 NOTE — Telephone Encounter (Signed)
lisinopril (PRINIVIL,ZESTRIL) 2.5 MG tablet  Medication  Date: 05/20/2017 Department: [facility] [address] Office Ordering/Authorizing: Tonny Bollman, MD  Order Providers   Prescribing Provider Encounter Provider  Tonny Bollman, MD Tonny Bollman, MD  Outpatient Medication Detail    Disp Refills Start End   lisinopril (PRINIVIL,ZESTRIL) 2.5 MG tablet 90 tablet 3 05/20/2017    Sig - Route: Take 1 tablet (2.5 mg total) by mouth daily. - Oral   Sent to pharmacy as: lisinopril (PRINIVIL,ZESTRIL) 2.5 MG tablet   E-Prescribing Status: Receipt confirmed by pharmacy (05/20/2017 3:42 PM EDT)   Associated Diagnoses   Ischemic cardiomyopathy     Essential hypertension     Pharmacy   [id number] - [place], [place] - [address]    Order Providers   Prescribing Provider Encounter Provider  Tonny Bollman, MD Tonny Bollman, MD  Outpatient Medication Detail    Disp Refills Start End   carvedilol (COREG) 3.125 MG tablet 180 tablet 3 05/20/2017    Sig - Route: Take 1 tablet (3.125 mg total) by mouth 2 (two) times daily with a meal. - Oral   Sent to pharmacy as: carvedilol (COREG) 3.125 MG tablet   E-Prescribing Status: Receipt confirmed by pharmacy (05/20/2017 3:42 PM EDT)   Pharmacy   [id number] - [place], [place] - [address]

## 2018-04-29 ENCOUNTER — Encounter: Payer: Self-pay | Admitting: Physician Assistant

## 2018-05-20 ENCOUNTER — Ambulatory Visit (INDEPENDENT_AMBULATORY_CARE_PROVIDER_SITE_OTHER): Payer: BLUE CROSS/BLUE SHIELD | Admitting: Physician Assistant

## 2018-05-20 ENCOUNTER — Encounter: Payer: Self-pay | Admitting: Physician Assistant

## 2018-05-20 VITALS — BP 112/72 | HR 86 | Ht 63.0 in | Wt 227.1 lb

## 2018-05-20 DIAGNOSIS — I25119 Atherosclerotic heart disease of native coronary artery with unspecified angina pectoris: Secondary | ICD-10-CM | POA: Diagnosis not present

## 2018-05-20 DIAGNOSIS — E785 Hyperlipidemia, unspecified: Secondary | ICD-10-CM

## 2018-05-20 DIAGNOSIS — I10 Essential (primary) hypertension: Secondary | ICD-10-CM

## 2018-05-20 DIAGNOSIS — Z720 Tobacco use: Secondary | ICD-10-CM | POA: Insufficient documentation

## 2018-05-20 DIAGNOSIS — I255 Ischemic cardiomyopathy: Secondary | ICD-10-CM

## 2018-05-20 HISTORY — DX: Essential (primary) hypertension: I10

## 2018-05-20 MED ORDER — CARVEDILOL 3.125 MG PO TABS: 3.1250 mg | ORAL_TABLET | Freq: Two times a day (BID) | ORAL | 3 refills | Status: DC

## 2018-05-20 MED ORDER — ATORVASTATIN CALCIUM 40 MG PO TABS: ORAL_TABLET | ORAL | 3 refills | Status: DC

## 2018-05-20 MED ORDER — LISINOPRIL 2.5 MG PO TABS: 2.5000 mg | ORAL_TABLET | Freq: Every day | ORAL | 3 refills | Status: DC

## 2018-05-20 MED ORDER — TICAGRELOR 60 MG PO TABS: 60.0000 mg | ORAL_TABLET | Freq: Two times a day (BID) | ORAL | 3 refills | Status: DC

## 2018-05-20 NOTE — Patient Instructions (Signed)
Medication Instructions:  Refilled your medications  Your physician recommends that you continue on your current medications as directed. Please refer to the Current Medication list given to you today.  Follow-Up: Your physician wants you to follow-up in: 6 months with Dr. Excell Seltzer or Tereso Newcomer, PA. You will receive a reminder letter in the mail two months in advance. If you don't receive a letter, please call our office to schedule the follow-up appointment.  If you need a refill on your cardiac medications before your next appointment, please call your pharmacy.

## 2018-05-20 NOTE — Progress Notes (Signed)
Cardiology Office Note:    Date:  05/20/2018   ID:  Sara Hobbs, DOB 25-Jan-1961, MRN [id number]  PCP:  Lavell Islam, MD  Cardiologist:  Tonny Bollman, MD  Electrophysiologist:  None   Referring MD: Andi Devon Laban Emperor, MD   Chief Complaint  Patient presents with  . Follow-up    CAD    History of Present Illness:    Tyran Nix is a 57 y.o. female with CADs/pCABG, COPD, diabetes, HTN, HL, chronic pain.  She had dehiscence of her sternum post CABG as well as fat necrosis and required wound vac for a while.She was admitted in 3/17with chest painand while in triage she became unresponsive and developed PEA arrest. LHC demonstrated 3 vessel CAD with severe stenosis of the proximal LCx, patent LIMA-LAD and chronic occlusion of the SVG-PDA. She underwent PCI with DES tonative LCx.  Nuclear stress test in 8/17 demonstrated moderate sized inferior wall infarct from apex to the base with moderate peri-infarct ischemia, EF 49%.  This was felt to be consistent with her known anatomy (occluced RCA and an occluded S-RCA with L and R collaterals to the distal RCA).  Medical therapy was continued.  She was then admitted in October 2017 with chest pain.  Cardiac catheterization demonstrated an occluded SVG-RPDA and a patent LIMA-distal LAD.  Stent in the LCx was also patent.  Medical therapy was continued.   She was last seen in March 2019.    Ms. Steidinger returns for follow up.  She is here with her husband.  She notes her hemoglobin was found to be 2 grams lower yesterday with her PCP.  She has not had any bleeding issues.  She has chronic chest pain related to positional changes.  This is largely unchanged.  She has chronic shortness of breath without change.  She has not had paroxysmal nocturnal dyspnea, syncope. She has occasional leg swelling.   Prior CV studies:   The following studies were reviewed today:  Cardiac catheterization 06/19/16  LM normal LAD proximal 80; D1 90; first  septal branch ostial 100 LCx proximal stent patent; OM2 35 RCA mid 100 SVG-RPDA 100 LIMA-distal LAD patent EF 45-50 Medical therapy  Nuclear Stress Test 04/23/16 Moderate sized inferior wall infarct from apex to base with moderate peri infarct ischemia EF 49% with inferobasal hypokinesis  Echo 11/26/15 Distal septal and posterior lateral HK, EF 45-50%, mild MR  LHC 11/25/15 LM normal LAD proximal 80%, D1 80%, ostial first septal branch 100% LCx proximal 99% ISR RCA mid 100% with left-right collaterals SVG-RPDA 100% LIMA-LAD patent EF 45-50%, distal inferior akinesis, basal inferior hypokinesis, moderate MR PCI: 2.75 x 12 mm Promus DES to the LCx  Carotid US 1/16 Bilateral - 1% to 39% ICA stenosis  Past Medical History:  Diagnosis Date  . Angina pectoris (HCC)   . Anxiety   . Arthritis    "hands, neck, back, knees, hips, ankles" (11/22/2014)  . Asthma   . CAD (coronary artery disease)    a. 09/2014: LIMA to LAD, SVG to PDA, EVH via right thigh b. Cardiac Arrest 11/2015: patent LIMA-LAD, CTO if SVG-PDA, 99% stenosis of Prox-Mid Cx w/ DES placed  . CAD, multiple vessel   . Cardiac arrest (HCC) 11/25/2015  . Chest tightness   . CHF (congestive heart failure) (HCC)   . Chronic lower back pain   . Chronic shoulder pain    "right; I've got bad rotator cuff"  . COPD (chronic obstructive pulmonary disease) (HCC)   .  Depression   . Diabetes mellitus with complication (HCC)   . Essential hypertension 05/20/2018  . GERD (gastroesophageal reflux disease)   . Headache    "monthly" (11/22/2014)  . History of blood transfusion    "when I had my 1st youngun; when I had MI"  . History of hiatal hernia   . History of stomach ulcers   . Hyperlipidemia   . Hypothyroidism    PMH:  . IDDM (insulin dependent diabetes mellitus) (HCC)   . Incidental lung nodule, > 3mm and < 8mm 11/22/2014   Small non-calcified nodules left lung  . Myocardial infarction (HCC) 11/2013   PCI w/ drug  eluting stent LCx  . Neuropathy due to secondary diabetes (HCC)   . Wears glasses    Surgical Hx: The patient  has a past surgical history that includes Cesarean section (1982; 1984); Gastric resection (1990's); Coronary angioplasty with stent (11/11/2013); Breast surgery (Left, 1990's); Colonoscopy; Esophagogastroduodenoscopy; Coronary artery bypass graft (N/A, 09/22/2014); TEE without cardioversion (N/A, 09/22/2014); Laparoscopic cholecystectomy (2000); Abdominal hysterectomy (1990's); Cardiac catheterization (09/01/14); Sternal wound debridement (N/A, 11/23/2014); Application if wound vac (N/A, 11/23/2014); Cardiac catheterization (N/A, 11/25/2015); Cardiac catheterization (N/A, 11/25/2015); and Cardiac catheterization (N/A, 06/19/2016).   Current Medications: Current Meds  Medication Sig  . albuterol (PROVENTIL HFA;VENTOLIN HFA) 108 (90 BASE) MCG/ACT inhaler Inhale 2 puffs into the lungs every 6 (six) hours as needed for wheezing or shortness of breath.  Marland Kitchen aspirin EC 81 MG tablet Take 81 mg by mouth daily.  Marland Kitchen atorvastatin (LIPITOR) 40 MG tablet TAKE 1 TABLET BY MOUTH ONCE DAILY AT  6  PM  . carvedilol (COREG) 3.125 MG tablet Take 1 tablet (3.125 mg total) by mouth 2 (two) times daily with a meal.  . Fluticasone-Salmeterol (ADVAIR) 100-50 MCG/DOSE AEPB Inhale 1 puff into the lungs 2 (two) times daily as needed (shortness of breath).   . insulin aspart (NOVOLOG FLEXPEN) 100 UNIT/ML FlexPen Inject 0-5 Units into the skin daily. Based on sliding scale  . insulin glargine (LANTUS) 100 unit/mL SOPN Inject 50 Units into the skin 2 (two) times daily. 60 units in the morning and 50 units at night  . lisinopril (PRINIVIL,ZESTRIL) 2.5 MG tablet Take 1 tablet (2.5 mg total) by mouth daily.  . nitroGLYCERIN (NITROSTAT) 0.4 MG SL tablet Place 1 tablet (0.4 mg total) under the tongue every 5 (five) minutes as needed for chest pain.  . OXYCODONE ER PO Take 1 tablet by mouth every 8 (eight) hours as needed (PAIN).     Marland Kitchen ticagrelor (BRILINTA) 60 MG TABS tablet Take 1 tablet (60 mg total) by mouth 2 (two) times daily.  Marland Kitchen tiotropium (SPIRIVA) 18 MCG inhalation capsule Place 18 mcg into inhaler and inhale daily as needed (shortness of breath).   . [DISCONTINUED] atorvastatin (LIPITOR) 40 MG tablet TAKE 1 TABLET BY MOUTH ONCE DAILY AT  6  PM  . [DISCONTINUED] carvedilol (COREG) 3.125 MG tablet Take 1 tablet (3.125 mg total) by mouth 2 (two) times daily with a meal.  . [DISCONTINUED] lisinopril (PRINIVIL,ZESTRIL) 2.5 MG tablet Take 1 tablet (2.5 mg total) by mouth daily.  . [DISCONTINUED] ticagrelor (BRILINTA) 60 MG TABS tablet Take 1 tablet (60 mg total) by mouth 2 (two) times daily.     Allergies:   Wellbutrin [bupropion]   Social History   Tobacco Use  . Smoking status: Current Every Day Smoker    Packs/day: 2.00    Years: 41.00    Pack years: 82.00    Types: Cigarettes  .  Smokeless tobacco: Former Neurosurgeon    Types: Chew    Quit date: 09/13/2005  Substance Use Topics  . Alcohol use: No    Alcohol/week: 0.0 standard drinks  . Drug use: No     Family Hx: The patient's She was adopted. Family history is unknown by patient.  ROS:   Please see the history of present illness.    Review of Systems  Constitution: Positive for malaise/fatigue and weight gain.  Cardiovascular: Positive for leg swelling.  Respiratory: Positive for cough.   Musculoskeletal: Positive for back pain, joint swelling and myalgias.  Gastrointestinal: Positive for abdominal pain and nausea.  Neurological: Positive for dizziness, headaches and loss of balance.  Psychiatric/Behavioral: Positive for depression. The patient is nervous/anxious.    All other systems reviewed and are negative.   EKGs/Labs/Other Test Reviewed:    EKG:  EKG is not ordered today.   Recent Labs: 11/18/2017: NT-Pro BNP 69   Recent Lipid Panel Lab Results  Component Value Date/Time   CHOL 140 01/31/2016 07:43 AM   TRIG 283 (H) 01/31/2016 07:43 AM    HDL 31 (L) 01/31/2016 07:43 AM   CHOLHDL 4.5 01/31/2016 07:43 AM   LDLCALC 52 01/31/2016 07:43 AM   LDLDIRECT 152.0 04/20/2015 02:50 PM   From [facility] Cholesterol, total  156.000  09/06/2017 HDL    43.000  09/06/2017 LDL    74.000  09/06/2017 Triglycerides   193.000  09/06/2017 A1C HbA1c=   8.   01/24/2018 Hemoglobin   17.100  07/28/2016 Creatinine, Serum  0.700   09/06/2017 Potassium   4.300   09/06/2017 ALT (SGPT)   53.000  09/06/2017 TSH    4.010   09/06/2017 BNP    36.200  12/06/2015 INR    1.120   06/19/2016 Platelets   186.000  07/28/2016  Physical Exam:    VS:  BP 112/72   Pulse 86   Ht 5\' 3"  (1.6 m)   Wt 227 lb 1.9 oz (103 kg)   SpO2 96%   BMI 40.23 kg/m     Wt Readings from Last 3 Encounters:  05/20/18 227 lb 1.9 oz (103 kg)  11/18/17 216 lb 6.4 oz (98.2 kg)  05/20/17 208 lb 6.4 oz (94.5 kg)     Physical Exam  Constitutional: She is oriented to person, place, and time. She appears well-developed and well-nourished. No distress.  HENT:  Head: Normocephalic and atraumatic.  Eyes: No scleral icterus.  Neck: No JVD present. No thyromegaly present.  Cardiovascular: Normal rate and regular rhythm.  No murmur heard. Pulmonary/Chest: Effort normal. She has no rales.  Abdominal: Soft. She exhibits no distension.  Musculoskeletal: She exhibits no edema.  Lymphadenopathy:    She has no cervical adenopathy.  Neurological: She is alert and oriented to person, place, and time.  Skin: Skin is warm and dry.    ASSESSMENT & PLAN:    Coronary artery disease involving native coronary artery of native heart with angina pectoris [facility]) Status post CABG and subsequent DES to the LCx in March 2017.  RCA and SVG-PDA are known to be occluded with extensive collaterals.  Cardiac catheterization in October 2017 demonstrated stable anatomy with patent LIMA-LAD and patent LCx stent.  Her symptoms are currently stable on her current medical regimen.  Continue ASA, Brilinta 60 mg Twice daily,  Atorvastatin, Carvedilol, Lisinopril. Follow up in 6 months.    Essential hypertension  The patient's blood pressure is controlled on her current regimen.  Continue current therapy.  Hyperlipidemia LDL optimal on most recent lab work.  Continue current Rx.    Tobacco abuse She continues to smoke.  Ischemic cardiomyopathy  EF 45-50.  She has no evidence of volume excess.  BP will not tolerate further adjustments in her medical regimen.  Continue beta-blocker, ACE inhibitor.     Dispo:  Return in about 6 months (around 11/18/2018) for Routine Follow Up, w/ Dr. Excell Seltzer, or Tereso Newcomer, PA-C.   Medication Adjustments/Labs and Tests Ordered: Current medicines are reviewed at length with the patient today.  Concerns regarding medicines are outlined above.  Tests Ordered: No orders of the defined types were placed in this encounter.  Medication Changes: Meds ordered this encounter  Medications  . ticagrelor (BRILINTA) 60 MG TABS tablet    Sig: Take 1 tablet (60 mg total) by mouth 2 (two) times daily.    Dispense:  180 tablet    Refill:  3    DOSE CHANGE  . lisinopril (PRINIVIL,ZESTRIL) 2.5 MG tablet    Sig: Take 1 tablet (2.5 mg total) by mouth daily.    Dispense:  90 tablet    Refill:  3  . carvedilol (COREG) 3.125 MG tablet    Sig: Take 1 tablet (3.125 mg total) by mouth 2 (two) times daily with a meal.    Dispense:  180 tablet    Refill:  3  . atorvastatin (LIPITOR) 40 MG tablet    Sig: TAKE 1 TABLET BY MOUTH ONCE DAILY AT  6  PM    Dispense:  90 tablet    Refill:  3    Signed, Tereso Newcomer, PA-C  05/20/2018 4:26 PM    [facility] [address] [place], [place], [place]  [address] Phone: [phone]; Fax: [phone]

## 2018-11-24 ENCOUNTER — Telehealth: Payer: Self-pay

## 2018-11-24 NOTE — Telephone Encounter (Signed)
Due to COVID-19 pandemic, called to offer the patient a later appointment or an e-visit to promote social distancing.  She states she has no new symptoms and would like to wait to see Dr. Excell Seltzer. She understands she will be called to arrange an appointment once precautions are lifted.  She was grateful for assistance.

## 2018-11-27 ENCOUNTER — Ambulatory Visit: Payer: BLUE CROSS/BLUE SHIELD | Admitting: Cardiovascular Disease

## 2018-12-03 NOTE — Telephone Encounter (Signed)
Scheduled patient 03/27/2019 with Dr. Excell Seltzer. She was grateful for call and agrees with treatment plan.

## 2019-03-06 ENCOUNTER — Other Ambulatory Visit: Payer: Self-pay | Admitting: Physician Assistant

## 2019-03-06 DIAGNOSIS — I25118 Atherosclerotic heart disease of native coronary artery with other forms of angina pectoris: Secondary | ICD-10-CM

## 2019-03-26 ENCOUNTER — Telehealth: Payer: Self-pay

## 2019-03-26 NOTE — Telephone Encounter (Signed)
    COVID-19 Pre-Screening Questions:  . In the past 7 to 10 days have you had a cough,  shortness of breath, headache, congestion, fever (100 or greater) body aches, chills, sore throat, or sudden loss of taste or sense of smell? . Have you been around anyone with known Covid 19. . Have you been around anyone who is awaiting Covid 19 test results in the past 7 to 10 days? . Have you been around anyone who has been exposed to Covid 19, or has mentioned symptoms of Covid 19 within the past 7 to 10 days?  If you have any concerns/questions about symptoms patients report during screening (either on the phone or at threshold). Contact the provider seeing the patient or DOD for further guidance.  If neither are available contact a member of the leadership team.    I called and spoke with patient, she answered no to all of the above questions.        

## 2019-03-27 ENCOUNTER — Other Ambulatory Visit: Payer: Self-pay

## 2019-03-27 ENCOUNTER — Ambulatory Visit (INDEPENDENT_AMBULATORY_CARE_PROVIDER_SITE_OTHER): Payer: BC Managed Care – PPO | Admitting: Cardiovascular Disease

## 2019-03-27 ENCOUNTER — Encounter: Payer: Self-pay | Admitting: Cardiovascular Disease

## 2019-03-27 VITALS — BP 120/80 | HR 77 | Ht 63.0 in | Wt 222.2 lb

## 2019-03-27 DIAGNOSIS — I25119 Atherosclerotic heart disease of native coronary artery with unspecified angina pectoris: Secondary | ICD-10-CM | POA: Diagnosis not present

## 2019-03-27 DIAGNOSIS — I10 Essential (primary) hypertension: Secondary | ICD-10-CM

## 2019-03-27 DIAGNOSIS — E782 Mixed hyperlipidemia: Secondary | ICD-10-CM

## 2019-03-27 DIAGNOSIS — Z720 Tobacco use: Secondary | ICD-10-CM | POA: Diagnosis not present

## 2019-03-27 NOTE — Progress Notes (Signed)
Cardiology Office Note:    Date:  03/27/2019   ID:  Sara Hobbs, DOB June 05, 1961, MRN [id number]  PCP:  Alferd Apa, MD  Cardiologist:  Tonny Bollman, MD  Electrophysiologist:  None   Referring MD: Andi Devon Laban Emperor, MD   Chief Complaint  Patient presents with   Shortness of Breath    History of Present Illness:    Sara Hobbs is a 58 y.o. female with a hx of extensive coronary artery disease, presenting for follow-up evaluation.  The patient has a complex medical history with coronary artery disease and remote CABG, COPD, diabetes, hypertension, hyperlipidemia, and chronic pain syndrome. She presented 11/25/2015 with chest pain, shortness of breath, and PEA arrest in the emergency room. She was resuscitated and underwent emergency cardiac catheterization showing three-vessel coronary artery disease with severe stenosis of the proximal left circumflex. She had patency of the LIMA to LAD graft and chronic occlusion of the vein graft to PDA. She underwent PCI of the native left circumflex without complication.   The patient is here with her husband today.  She reports no major changes in her cardiac symptoms since her last visit here.  She is limited by shortness of breath.  She is not had much chest pain of late.  She has occasional discomfort and takes nitroglycerin but she is not really sure if it helps.  All of this is been self-limited with no change in the overall pattern.  She occasionally has swelling in her hands and feet.  She denies orthopnea or PND.  She continues to smoke.  She has developed a resting tremor of both hands.  Her medications are unchanged.  Past Medical History:  Diagnosis Date   Angina pectoris (HCC)    Anxiety    Arthritis    "hands, neck, back, knees, hips, ankles" (11/22/2014)   Asthma    CAD (coronary artery disease)    a. 09/2014: LIMA to LAD, SVG to PDA, EVH via right thigh b. Cardiac Arrest 11/2015: patent LIMA-LAD, CTO if SVG-PDA, 99%  stenosis of Prox-Mid Cx w/ DES placed   CAD, multiple vessel    Cardiac arrest (HCC) 11/25/2015   Chest tightness    CHF (congestive heart failure) (HCC)    Chronic lower back pain    Chronic shoulder pain    "right; I've got bad rotator cuff"   COPD (chronic obstructive pulmonary disease) (HCC)    Depression    Diabetes mellitus with complication (HCC)    Essential hypertension 05/20/2018   GERD (gastroesophageal reflux disease)    Headache    "monthly" (11/22/2014)   History of blood transfusion    "when I had my 1st youngun; when I had MI"   History of hiatal hernia    History of stomach ulcers    Hyperlipidemia    Hypothyroidism    PMH:   IDDM (insulin dependent diabetes mellitus) (HCC)    Incidental lung nodule, > 70mm and < 66mm 11/22/2014   Small non-calcified nodules left lung   Myocardial infarction (HCC) 11/2013   PCI w/ drug eluting stent LCx   Neuropathy due to secondary diabetes (HCC)    Wears glasses     Past Surgical History:  Procedure Laterality Date   ABDOMINAL HYSTERECTOMY  1990's   APPLICATION OF WOUND VAC N/A 11/23/2014   Procedure: POSSIBLE APPLICATION OF WOUND VAC;  Surgeon: Purcell Nails, MD;  Location: [facility];  Service: Thoracic;  Laterality: N/A;   BREAST SURGERY Left 1990's   breast  duct surgery   CARDIAC CATHETERIZATION  09/01/14   CARDIAC CATHETERIZATION N/A 11/25/2015   Procedure: Left Heart Cath and Cors/Grafts Angiography;  Surgeon: Sherren Mocha, MD;  Location: [place] [facility] [facility];  Service: Cardiovascular;  Laterality: N/A;   CARDIAC CATHETERIZATION N/A 11/25/2015   Procedure: Coronary Stent Intervention;  Surgeon: Sherren Mocha, MD;  Location: [place] [facility];  Service: Cardiovascular;  Laterality: N/A;   CARDIAC CATHETERIZATION N/A 06/19/2016   Procedure: Left Heart Cath and Cors/Grafts Angiography;  Surgeon: Peter M Martinique, MD;  Location: [place] [facility];  Service: Cardiovascular;  Laterality: N/A;    CESAREAN SECTION  1982; [place] WITH STENT PLACEMENT  11/11/2013   Drug-eluting stent in LCx   CORONARY ARTERY BYPASS GRAFT N/A 09/22/2014   Procedure: CORONARY ARTERY BYPASS GRAFTING (CABG);  Surgeon: Rexene Alberts, MD;  Location: [facility];  Service: Open Heart Surgery;  Laterality: N/A;  Times 2 using left internal mammary artery and endoscopically harvested right saphenous vein   ESOPHAGOGASTRODUODENOSCOPY     GASTRIC RESECTION  1990's   bezoar; "for reflux"   [place] N/A 11/23/2014   Procedure: SUPERFICIAL STERNAL WOUND DEBRIDEMENT;  Surgeon: Rexene Alberts, MD;  Location: [facility];  Service: Thoracic;  Laterality: N/A;   TEE WITHOUT CARDIOVERSION N/A 09/22/2014   Procedure: TRANSESOPHAGEAL ECHOCARDIOGRAM (TEE);  Surgeon: Rexene Alberts, MD;  Location: [facility];  Service: Open Heart Surgery;  Laterality: N/A;    Current Medications: Current Meds  Medication Sig   albuterol (PROVENTIL HFA;VENTOLIN HFA) 108 (90 BASE) MCG/ACT inhaler Inhale 2 puffs into the lungs every 6 (six) hours as needed for wheezing or shortness of breath.   atorvastatin (LIPITOR) 40 MG tablet TAKE 1 TABLET BY MOUTH ONCE DAILY AT  6  PM   carvedilol (COREG) 3.125 MG tablet Take 1 tablet (3.125 mg total) by mouth 2 (two) times daily with a meal.   Fluticasone-Salmeterol (ADVAIR) 100-50 MCG/DOSE AEPB Inhale 1 puff into the lungs 2 (two) times daily as needed (shortness of breath).    insulin aspart (NOVOLOG FLEXPEN) 100 UNIT/ML FlexPen Inject 0-5 Units into the skin daily. Based on sliding scale   insulin glargine (LANTUS) 100 unit/mL SOPN Inject 50 Units into the skin 2 (two) times daily. 60 units in the morning and 50 units at night   lisinopril (PRINIVIL,ZESTRIL) 2.5 MG tablet Take 1 tablet (2.5 mg total) by mouth daily.   methylPREDNISolone (MEDROL) 4 MG tablet Take 4 mg by mouth 2 (two) times daily.   nitroGLYCERIN  (NITROSTAT) 0.4 MG SL tablet DISSOLVE ONE TABLET UNDER THE TONGUE EVERY 5 MINUTES AS NEEDED FOR CHEST PAIN.  DO NOT EXCEED A TOTAL OF 3 DOSES IN 15 MINUTES NOW   OXYCODONE ER PO Take 1 tablet by mouth every 8 (eight) hours as needed (PAIN).    ticagrelor (BRILINTA) 60 MG TABS tablet Take 1 tablet (60 mg total) by mouth 2 (two) times daily.   tiotropium (SPIRIVA) 18 MCG inhalation capsule Place 18 mcg into inhaler and inhale daily as needed (shortness of breath).      Allergies:   Wellbutrin [bupropion]   Social History   Socioeconomic History   Marital status: Married    Spouse name: Not on file   Number of children: Not on file   Years of education: Not on file   Highest education level: Not on file  Occupational History   Not  on file  Social Needs   Financial resource strain: Not on file   Food insecurity    Worry: Not on file    Inability: Not on file   Transportation needs    Medical: Not on file    Non-medical: Not on file  Tobacco Use   Smoking status: Current Every Day Smoker    Packs/day: 2.00    Years: 41.00    Pack years: 82.00    Types: Cigarettes   Smokeless tobacco: Former NeurosurgeonUser    Types: Chew    Quit date: 09/13/2005  Substance and Sexual Activity   Alcohol use: No    Alcohol/week: 0.0 standard drinks   Drug use: No   Sexual activity: Yes  Lifestyle   Physical activity    Days per week: Not on file    Minutes per session: Not on file   Stress: Not on file  Relationships   Social connections    Talks on phone: Not on file    Gets together: Not on file    Attends religious service: Not on file    Active member of club or organization: Not on file    Attends meetings of clubs or organizations: Not on file    Relationship status: Not on file  Other Topics Concern   Not on file  Social History Narrative   Not on file     Family History: The patient's She was adopted. Family history is unknown by patient.  ROS:   Please see the  history of present illness.    All other systems reviewed and are negative.  EKGs/Labs/Other Studies Reviewed:    The following studies were reviewed today: Cath 06-20-2019: Conclusion    Prox Cx to Mid Cx lesion, 0 %stenosed at prior stent site.  Prox LAD lesion, 80 %stenosed.  Ost 1st Sept lesion, 100 %stenosed.  Mid RCA lesion, 100 %stenosed.  SVG to ther RCA is 100% occluded.  LIMA to the LAD is widely patent  Ost 2nd Mrg to 2nd Mrg lesion, 35 %stenosed.  There is mild left ventricular systolic dysfunction.  LV end diastolic pressure is normal.  The left ventricular ejection fraction is 45-50% by visual estimate.  1st Diag lesion, 90 %stenosed.   1. Severe 2 vessel obstructive CAD 2. Patent stents in the proximal to mid LCx 3. Patent LIMA to the LAD 4. Occluded SVG to RCA. This is chronic 5. Mild LV dysfunction. 6. Normal LV EDP  Plan: potential areas of ischemia include the chronic occlusion of the RCA and the first diagonal. These findings are unchanged from prior cardiac cath so are unlikely to explain her acute chest pain symptoms. Recommend continued medical management.    EKG:  EKG is ordered today.  The ekg ordered today demonstrates normal sinus rhythm 77 bpm, possible inferior infarct age undetermined, T wave abnormality consider lateral ischemia.  No significant change from previous tracings.  Recent Labs: No results found for requested labs within last 8760 hours.  Recent Lipid Panel    Component Value Date/Time   CHOL 140 01/31/2016 0743   TRIG 283 (H) 01/31/2016 0743   HDL 31 (L) 01/31/2016 0743   CHOLHDL 4.5 01/31/2016 0743   VLDL 57 (H) 01/31/2016 0743   LDLCALC 52 01/31/2016 0743   LDLDIRECT 152.0 04/20/2015 1450    Physical Exam:    VS:  BP 120/80    Pulse 77    Ht 5\' 3"  (1.6 m)    Wt 222 lb 3.2  oz (100.8 kg)    SpO2 93%    BMI 39.36 kg/m     Wt Readings from Last 3 Encounters:  03/27/19 222 lb 3.2 oz (100.8 kg)  05/20/18 227 lb  1.9 oz (103 kg)  11/18/17 216 lb 6.4 oz (98.2 kg)     GEN: Obese woman in no acute distress HEENT: Normal NECK: No JVD; No carotid bruits LYMPHATICS: No lymphadenopathy CARDIAC: RRR, no murmurs, rubs, gallops RESPIRATORY:  Clear to auscultation without rales, wheezing or rhonchi  ABDOMEN: Soft, non-tender, non-distended, obese MUSCULOSKELETAL: Trace bilateral pretibial edema; No deformity  SKIN: Warm and dry NEUROLOGIC:  Alert and oriented x 3 PSYCHIATRIC:  Normal affect   ASSESSMENT:    1. Essential hypertension   2. Coronary artery disease involving native coronary artery of native heart with angina pectoris (HCC)   3. Tobacco abuse   4. Mixed hyperlipidemia    PLAN:    In order of problems listed above:  1. Blood pressure is well controlled on current regimen.  I reviewed her medications and no changes are made today. 2. The patient is stable with only mild occasional symptoms of angina.  She is appropriately on ticagrelor, a beta-blocker, and a high intensity statin drug. 3. Cessation counseling done.  Patient not ready to quit. 4. Treated with atorvastatin 40 mg daily.  Recent lipids show a cholesterol 120 and LDL of 43 mg/dL.   Medication Adjustments/Labs and Tests Ordered: Current medicines are reviewed at length with the patient today.  Concerns regarding medicines are outlined above.  Orders Placed This Encounter  Procedures   EKG 12-Lead   No orders of the defined types were placed in this encounter.   Patient Instructions  Medication Instructions:  Your provider recommends that you continue on your current medications as directed. Please refer to the Current Medication list given to you today.    Labwork: None  Testing/Procedures: None  Follow-Up: You have an appointment with Tereso NewcomerScott Weaver on September 30, 2019 at 3:45PM.     Signed, Tonny BollmanMichael Trai Ells, MD  03/27/2019 4:15 PM    [facility]

## 2019-03-27 NOTE — Patient Instructions (Signed)
Medication Instructions:  Your provider recommends that you continue on your current medications as directed. Please refer to the Current Medication list given to you today.    Labwork: None  Testing/Procedures: None  Follow-Up: You have an appointment with Richardson Dopp on September 30, 2019 at 3:45PM.

## 2019-06-02 ENCOUNTER — Other Ambulatory Visit: Payer: Self-pay | Admitting: Physician Assistant

## 2019-06-02 DIAGNOSIS — I255 Ischemic cardiomyopathy: Secondary | ICD-10-CM

## 2019-06-02 DIAGNOSIS — E785 Hyperlipidemia, unspecified: Secondary | ICD-10-CM

## 2019-06-02 DIAGNOSIS — I10 Essential (primary) hypertension: Secondary | ICD-10-CM

## 2019-07-20 ENCOUNTER — Encounter: Payer: Self-pay | Admitting: *Deleted

## 2019-08-17 ENCOUNTER — Other Ambulatory Visit: Payer: Self-pay | Admitting: Physician Assistant

## 2019-09-29 ENCOUNTER — Ambulatory Visit: Payer: BC Managed Care – PPO | Admitting: Physician Assistant

## 2019-09-29 NOTE — Progress Notes (Deleted)
Cardiology Office Note:    Date:  09/29/2019   ID:  Sara Hobbs, DOB 09-Aug-1961, MRN [id number]  PCP:  Azell Der, MD  Cardiologist:  Sherren Mocha, MD *** Electrophysiologist:  None   Referring MD: Azell Der, MD   No chief complaint on file. ***  History of Present Illness:    Sara Hobbs is a 59 y.o. female with:   Coronary artery disease   s/p CABG   c/b dehiscence of sternum, fat necrosis >> wound vac  S/p PEA arrest (occurred in triage at ED) >> L-LAD ok, RCA 100, S-PDA 100, severe LCx dz >> s/p DES to LCx  Cath 2017: LCx stent ok, L-LAD ok, S-PDA 100  Ischemic CM  Echocardiogram 3/17: EF 45-50, sept and lat HK  Diabetes mellitus   Hypertension   Hyperlipidemia  COPD  Chronic pain   Ms. [name] has coronary artery disease and underwent CABG in 2016 which was c/b wound dehiscence and fat necrosis.  She then came into the ED in 2017 with chest pain and shortness of breath and developed PEA arrest while in triage.  Her cardiac catheterization demonstrated an occluded RCA and S-PDA.  The L-LAD was patent and the LCx had severe stenosis which was treated with a DES.  She was last seen in 03/2019 with Dr. Burt Knack.    The DICTATELATER SmartLink is not supported in this context. ***    Prior CV studies:   The following studies were reviewed today:  *** Cardiac catheterization 06/19/16 LM normal LAD proximal 80; D1 90; first septal branch ostial 100 LCx proximal stent patent; OM2 35 RCA mid 100 SVG-RPDA 100 LIMA-distal LAD patent EF 45-50 Medical therapy  Nuclear Stress Test 04/23/16 Moderate sized inferior wall infarct from apex to base with moderate peri infarct ischemia EF 49% with inferobasal hypokinesis  Echo 11/26/15 Distal septal and posterior lateral HK, EF 45-50%, mild MR  LHC 11/25/15 LM normal LAD proximal 80%, D1 80%, ostial first septal branch 100% LCx proximal 99% ISR RCA mid 100% with left-right collaterals SVG-RPDA  100% LIMA-LAD patent EF 45-50%, distal inferior akinesis, basal inferior hypokinesis, moderate MR PCI: 2.75 x 12 mm Promus DES to the LCx  Carotid US 1/16 Bilateral - 1% to 39% ICA stenosis   Past Medical History:  Diagnosis Date  . Angina pectoris ([place])   . Anxiety   . Arthritis    "hands, neck, back, knees, hips, ankles" (11/22/2014)  . Asthma   . CAD (coronary artery disease)    a. 09/2014: LIMA to LAD, SVG to PDA, EVH via right thigh b. Cardiac Arrest 11/2015: patent LIMA-LAD, CTO if SVG-PDA, 99% stenosis of Prox-Mid Cx w/ DES placed  . CAD, multiple vessel   . Cardiac arrest ([place]) 11/25/2015  . Chest tightness   . CHF (congestive heart failure) ([place])   . Chronic lower back pain   . Chronic shoulder pain    "right; I've got bad rotator cuff"  . COPD (chronic obstructive pulmonary disease) ([place])   . Depression   . Diabetes mellitus with complication ([place])   . Essential hypertension 05/20/2018  . GERD (gastroesophageal reflux disease)   . Headache    "monthly" (11/22/2014)  . History of blood transfusion    "when I had my 1st youngun; when I had MI"  . History of hiatal hernia   . History of stomach ulcers   . Hyperlipidemia   . Hypothyroidism    PMH:  . IDDM (insulin dependent diabetes mellitus) (  HCC)   . Incidental lung nodule, > 3mm and < 47mm 11/22/2014   Small non-calcified nodules left lung  . Myocardial infarction (HCC) 11/2013   PCI w/ drug eluting stent LCx  . Neuropathy due to secondary diabetes (HCC)   . Wears glasses    Surgical Hx: The patient  has a past surgical history that includes Cesarean section (1982; 1984); Gastric resection (1990's); Coronary angioplasty with stent (11/11/2013); Breast surgery (Left, 1990's); Colonoscopy; Esophagogastroduodenoscopy; Coronary artery bypass graft (N/A, 09/22/2014); TEE without cardioversion (N/A, 09/22/2014); Laparoscopic cholecystectomy (2000); Abdominal hysterectomy (1990's); Cardiac catheterization (09/01/14); Sternal  wound debridement (N/A, 11/23/2014); Application if wound vac (N/A, 11/23/2014); Cardiac catheterization (N/A, 11/25/2015); Cardiac catheterization (N/A, 11/25/2015); and Cardiac catheterization (N/A, 06/19/2016).   Current Medications: No outpatient medications have been marked as taking for the 09/29/19 encounter (Appointment) with Tereso Newcomer T, PA-C.     Allergies:   Wellbutrin [bupropion]   Social History   Tobacco Use  . Smoking status: Current Every Day Smoker    Packs/day: 2.00    Years: 41.00    Pack years: 82.00    Types: Cigarettes  . Smokeless tobacco: Former Neurosurgeon    Types: Chew    Quit date: 09/13/2005  Substance Use Topics  . Alcohol use: No    Alcohol/week: 0.0 standard drinks  . Drug use: No     Family Hx: The patient's She was adopted. Family history is unknown by patient.  ROS:   Please see the history of present illness.    ROS All other systems reviewed and are negative.   EKGs/Labs/Other Test Reviewed:    EKG:  EKG is *** ordered today.  The ekg ordered today demonstrates ***  Recent Labs: No results found for requested labs within last 8760 hours.   Recent Lipid Panel Lab Results  Component Value Date/Time   CHOL 140 01/31/2016 07:43 AM   TRIG 283 (H) 01/31/2016 07:43 AM   HDL 31 (L) 01/31/2016 07:43 AM   CHOLHDL 4.5 01/31/2016 07:43 AM   LDLCALC 52 01/31/2016 07:43 AM   LDLDIRECT 152.0 04/20/2015 02:50 PM    Physical Exam:    VS:  There were no vitals taken for this visit.    Wt Readings from Last 3 Encounters:  03/27/19 222 lb 3.2 oz (100.8 kg)  05/20/18 227 lb 1.9 oz (103 kg)  11/18/17 216 lb 6.4 oz (98.2 kg)     ***Physical Exam  ASSESSMENT & PLAN:    *** Coronary artery disease involving native coronary artery of native heart with angina pectoris (HCC) Status post CABG and subsequent DES to the LCx in March 2017. RCA and SVG-PDA are known to be occluded with extensive collaterals. Cardiac catheterization in October 2017  demonstrated stable anatomy with patent LIMA-LAD and patent LCx stent.  Her symptoms are currently stable on her current medical regimen.  Continue ASA, Brilinta 60 mg Twice daily, Atorvastatin, Carvedilol, Lisinopril. Follow up in 6 months.    Essential hypertension  The patient's blood pressure is controlled on her current regimen.  Continue current therapy.    Hyperlipidemia LDL optimal on most recent lab work.  Continue current Rx.    Tobacco abuse She continues to smoke.  Ischemic cardiomyopathy  EF 45-50.  She has no evidence of volume excess.  BP will not tolerate further adjustments in her medical regimen.  Continue beta-blocker, ACE inhibitor.     Dispo:  No follow-ups on file.   Medication Adjustments/Labs and Tests Ordered: Current medicines are reviewed  at length with the patient today.  Concerns regarding medicines are outlined above.  Tests Ordered: No orders of the defined types were placed in this encounter.  Medication Changes: No orders of the defined types were placed in this encounter.   Signed, Tereso Newcomer, PA-C  09/29/2019 8:22 AM    [facility] [address] [place], [place], [place]  [address] Phone: [phone]; Fax: [phone]

## 2019-09-30 ENCOUNTER — Ambulatory Visit: Payer: BC Managed Care – PPO | Admitting: Physician Assistant

## 2019-11-08 ENCOUNTER — Encounter (HOSPITAL_BASED_OUTPATIENT_CLINIC_OR_DEPARTMENT_OTHER): Payer: Self-pay | Admitting: Emergency Medicine

## 2019-11-08 ENCOUNTER — Other Ambulatory Visit: Payer: Self-pay

## 2019-11-08 ENCOUNTER — Emergency Department (HOSPITAL_BASED_OUTPATIENT_CLINIC_OR_DEPARTMENT_OTHER): Payer: BC Managed Care – PPO

## 2019-11-08 ENCOUNTER — Emergency Department (HOSPITAL_BASED_OUTPATIENT_CLINIC_OR_DEPARTMENT_OTHER)
Admission: EM | Admit: 2019-11-08 | Discharge: 2019-11-08 | Disposition: A | Discharge status: Home / Self Care | Payer: BC Managed Care – PPO | Attending: Emergency Medicine | Admitting: Emergency Medicine

## 2019-11-08 DIAGNOSIS — Y929 Unspecified place or not applicable: Secondary | ICD-10-CM | POA: Diagnosis not present

## 2019-11-08 DIAGNOSIS — W19XXXA Unspecified fall, initial encounter: Secondary | ICD-10-CM | POA: Diagnosis not present

## 2019-11-08 DIAGNOSIS — Y999 Unspecified external cause status: Secondary | ICD-10-CM | POA: Insufficient documentation

## 2019-11-08 DIAGNOSIS — S42292A Other displaced fracture of upper end of left humerus, initial encounter for closed fracture: Secondary | ICD-10-CM | POA: Diagnosis not present

## 2019-11-08 DIAGNOSIS — I2510 Atherosclerotic heart disease of native coronary artery without angina pectoris: Secondary | ICD-10-CM | POA: Diagnosis not present

## 2019-11-08 DIAGNOSIS — E119 Type 2 diabetes mellitus without complications: Secondary | ICD-10-CM | POA: Diagnosis not present

## 2019-11-08 DIAGNOSIS — S4992XA Unspecified injury of left shoulder and upper arm, initial encounter: Secondary | ICD-10-CM | POA: Diagnosis present

## 2019-11-08 DIAGNOSIS — Y939 Activity, unspecified: Secondary | ICD-10-CM | POA: Insufficient documentation

## 2019-11-08 DIAGNOSIS — F17210 Nicotine dependence, cigarettes, uncomplicated: Secondary | ICD-10-CM | POA: Insufficient documentation

## 2019-11-08 HISTORY — DX: Atherosclerotic heart disease of native coronary artery without angina pectoris: I25.10

## 2019-11-08 HISTORY — DX: Polyneuropathy, unspecified: G62.9

## 2019-11-08 MED ORDER — HYDROCODONE-ACETAMINOPHEN 5-325 MG PO TABS: 1.0000 | ORAL_TABLET | Freq: Four times a day (QID) | ORAL | 0 refills | Status: DC | PRN

## 2019-11-08 MED ORDER — OXYCODONE-ACETAMINOPHEN 5-325 MG PO TABS
1.0000 | ORAL_TABLET | Freq: Once | ORAL | Status: AC
  Administered 2019-11-08: 1 via ORAL
  Filled 2019-11-08: qty 1

## 2019-11-08 NOTE — Discharge Instructions (Addendum)
Return here as needed. Follow up with the orthopedist provided.  °

## 2019-11-08 NOTE — ED Provider Notes (Signed)
[facility] EMERGENCY DEPARTMENT Provider Note   CSN: [id number] Arrival date & time: 11/08/19  1550     History Chief Complaint  Patient presents with   Sara Hobbs    Sara Hobbs is a 59 y.o. female.  HPI Patient presents to the emergency department with pain after a fall.  The patient states that she has neuropathy and she falls frequently.  She states she landed on her left shoulder and is having shoulder pain.  The patient states that certain movements palpation make her pain worse.  Patient states she did not hit her head.  She states she did hit her lip but has no laceration.  The patient denies chest pain, shortness of breath, headache,blurred vision, neck pain, fever, cough, weakness, numbness, dizziness, anorexia, edema, abdominal pain, nausea, vomiting, diarrhea, rash, back pain, dysuria, hematemesis, bloody stool, near syncope, or syncope.    Past Medical History:  Diagnosis Date   Arthritis    Coronary artery disease    Diabetes mellitus without complication (HCC)    Neuropathy     There are no problems to display for this patient.   Past Surgical History:  Procedure Laterality Date   ABDOMINAL HYSTERECTOMY     CARDIAC SURGERY     CESAREAN SECTION       OB History   No obstetric history on file.     No family history on file.  Social History   Tobacco Use   Smoking status: Current Every Day Smoker   Smokeless tobacco: Never Used  Substance Use Topics   Alcohol use: Not Currently   Drug use: Not on file    Home Medications Prior to Admission medications   Not on File    Allergies    Patient has no known allergies.  Review of Systems   Review of Systems All other systems negative except as documented in the HPI. All pertinent positives and negatives as reviewed in the HPI. Physical Exam Updated Vital Signs BP (!) 162/95 (BP Location: Right Arm)    Pulse 94    Temp 99.5 F (37.5 C) (Oral)    Resp 20    Ht 5\' 2"  (1.575 m)     Wt 103.4 kg    SpO2 95%    BMI 41.70 kg/m   Physical Exam Vitals and nursing note reviewed.  Constitutional:      General: She is not in acute distress.    Appearance: She is well-developed.  HENT:     Head: Normocephalic and atraumatic.  Eyes:     Pupils: Pupils are equal, round, and reactive to light.  Pulmonary:     Effort: Pulmonary effort is normal.  Musculoskeletal:     Left upper arm: Tenderness and bony tenderness present. No deformity.  Skin:    General: Skin is warm and dry.  Neurological:     Mental Status: She is alert and oriented to person, place, and time.     ED Results / Procedures / Treatments   Labs (all labs ordered are listed, but only abnormal results are displayed) Labs Reviewed - No data to display  EKG None  Radiology DG Elbow 2 Views Left  Result Date: 11/08/2019 CLINICAL DATA:  Left elbow and upper arm pain following a fall this morning. EXAM: LEFT ELBOW - 2 VIEW COMPARISON:  Left shoulder and humerus radiographs obtained at the same time. FINDINGS: Two oblique views of the left elbow demonstrate grossly normal appearing bones with no visible fracture, dislocation or effusion.  Positioning was limited by pain associated with a proximal humerus fracture. IMPRESSION: No visible fracture or dislocation. Limited examination with no gross abnormality seen. Electronically Signed   By: Claudie Revering M.D.   On: 11/08/2019 17:18   DG Shoulder Left  Result Date: 11/08/2019 CLINICAL DATA:  Left upper arm pain following a fall this morning. EXAM: LEFT SHOULDER - 2+ VIEW COMPARISON:  Left humerus radiographs obtained at the same time. FINDINGS: Comminuted fracture of the left humeral head and neck with mild impaction. No significant displacement. There is also inferior subluxation of the humeral head relative to the glenoid with no anterior or posterior dislocation evident on the humerus radiographs. IMPRESSION: Comminuted left humeral head and neck fracture with  inferior subluxation of the humeral head relative to the glenoid. Electronically Signed   By: Claudie Revering M.D.   On: 11/08/2019 17:20   DG Humerus Left  Result Date: 11/08/2019 CLINICAL DATA:  Upper left arm pain following a fall this morning. EXAM: LEFT HUMERUS - 2+ VIEW COMPARISON:  Left shoulder and elbow radiographs obtained at the same time. FINDINGS: Previously described comminuted and impacted left humeral head and neck fracture and inferior subluxation of the humeral head relative to the glenoid. IMPRESSION: Comminuted and impacted left humeral head and neck fracture and inferior subluxation of the humeral head relative to the glenoid. Electronically Signed   By: Claudie Revering M.D.   On: 11/08/2019 17:21    Procedures Procedures (including critical care time)  Medications Ordered in ED Medications  oxyCODONE-acetaminophen (PERCOCET/ROXICET) 5-325 MG per tablet 1 tablet (1 tablet Oral Given 11/08/19 1715)    ED Course  I have reviewed the triage vital signs and the nursing notes.  Pertinent labs & imaging results that were available during my care of the patient were reviewed by me and considered in my medical decision making (see chart for details).    MDM Rules/Calculators/A&P                      Patient has a humeral neck and head fracture.  Will place in a sling and have her follow-up with orthopedics.  Have advised the patient of the plan and all questions were answered.  Patient voiced understanding of this plan.  Final Clinical Impression(s) / ED Diagnoses Final diagnoses:  Fall    Rx / DC Orders ED Discharge Orders    None       Dalia Heading, PA-C 11/08/19 1734    Truddie Hidden, MD 11/08/19 [phone]

## 2019-11-08 NOTE — ED Triage Notes (Signed)
Pt with hx of neuropathy and sometimes she just falls. States she fell this morning. C/o L shoulder and elbow pain. Also has some swelling noted to lower lip.

## 2019-11-09 ENCOUNTER — Encounter: Payer: Self-pay | Admitting: Cardiovascular Disease

## 2019-11-12 ENCOUNTER — Telehealth: Payer: Self-pay | Admitting: Physician Assistant

## 2019-11-12 NOTE — Telephone Encounter (Signed)
I called and spoke with patient, she is aware that she will be able to have her husband accompany her to her appointment, note made in appointment notes.

## 2019-11-12 NOTE — Telephone Encounter (Signed)
New Message   Patient is calling because her spouse would need to come to the appt with her because of her poor memory.

## 2019-11-16 ENCOUNTER — Telehealth: Payer: Self-pay | Admitting: Physician Assistant

## 2019-11-16 NOTE — Telephone Encounter (Signed)
° °  [facility] Pre-operative Risk Assessment    Request for surgical clearance:  1. What type of surgery is being performed? Fixation  left shoulder  2. When is this surgery scheduled? 11/19/19  3. What type of clearance is required (medical clearance vs. Pharmacy clearance to hold med vs. Both)? Pharmacy  4. Are there any medications that need to be held prior to surgery and how long? Sara Hobbs   5. Practice name and name of physician performing surgery? Ortho Dr. Jasper Loser  6. What is your office phone number [phone]   7.   What is your office fax number [phone]  8.   Anesthesia type (None, local, MAC, general) ? Genral   Sara Hobbs 11/16/2019, 11:50 AM  _________________________________________________________________   (provider comments below)

## 2019-11-17 ENCOUNTER — Other Ambulatory Visit: Payer: Self-pay

## 2019-11-17 ENCOUNTER — Ambulatory Visit (INDEPENDENT_AMBULATORY_CARE_PROVIDER_SITE_OTHER): Payer: BC Managed Care – PPO | Admitting: Physician Assistant

## 2019-11-17 ENCOUNTER — Encounter: Payer: Self-pay | Admitting: Physician Assistant

## 2019-11-17 VITALS — BP 102/54 | HR 87 | Ht 62.0 in | Wt 226.0 lb

## 2019-11-17 DIAGNOSIS — E782 Mixed hyperlipidemia: Secondary | ICD-10-CM

## 2019-11-17 DIAGNOSIS — Z01810 Encounter for preprocedural cardiovascular examination: Secondary | ICD-10-CM

## 2019-11-17 DIAGNOSIS — I255 Ischemic cardiomyopathy: Secondary | ICD-10-CM

## 2019-11-17 DIAGNOSIS — I25119 Atherosclerotic heart disease of native coronary artery with unspecified angina pectoris: Secondary | ICD-10-CM | POA: Diagnosis not present

## 2019-11-17 DIAGNOSIS — I10 Essential (primary) hypertension: Secondary | ICD-10-CM

## 2019-11-17 DIAGNOSIS — Z720 Tobacco use: Secondary | ICD-10-CM

## 2019-11-17 NOTE — Telephone Encounter (Signed)
See OV note. Note sent to surgeon. Tereso Newcomer, PA-C    11/17/2019 5:25 PM

## 2019-11-17 NOTE — Progress Notes (Signed)
Cardiology Office Note:    Date:  11/17/2019   ID:  Virl Diamond, DOB [birth year], MRN [id number]  PCP:  Alferd Apa, MD  Cardiologist:  Tonny Bollman, MD   Electrophysiologist:  None   Referring MD: Alferd Apa, MD   Chief Complaint:  Follow-up (CAD, surgical clearance)    Patient Profile:    Sara Hobbs is a 60 y.o. female with:   Coronary artery disease   S/p CABG  S/p PEA arrest 3/17 >> s/p DES to LCx  Myoview 8/17: inf scar with mod peri-infarct ischemia; EF 49  Cath Jul 17, 2023: L-LAD patent, S-PDA chronic 100, LCx stent patent >> Med Rx  Ischemic CM   Echocardiogram 3/17: EF 45-50, mild MR, sept and post-lat HK  COPD  Diabetes mellitus   Hypertension   Hyperlipidemia   Tobacco use  Prior CV studies: Cardiac catheterization 07-16-2016  LM normal LAD proximal 80; D1 90; first septal branch ostial 100 LCx proximal stent patent; OM2 35 RCA mid 100 SVG-RPDA 100 LIMA-distal LAD patent EF 45-50 Medical therapy   Nuclear Stress Test 04/23/16 Moderate sized inferior wall infarct from apex to base with moderate peri infarct ischemia EF 49% with inferobasal hypokinesis   Echo 11/26/15 Distal septal and posterior lateral HK, EF 45-50%, mild MR   LHC 11/25/15 LM normal LAD proximal 80%, D1 80%, ostial first septal branch 100% LCx proximal 99% ISR RCA mid 100% with left-right collaterals SVG-RPDA 100% LIMA-LAD patent EF 45-50%, distal inferior akinesis, basal inferior hypokinesis, moderate MR PCI: 2.75 x 12 mm Promus DES to the LCx   Carotid US 1/16 Bilateral - 1% to 39% ICA stenosis    History of Present Illness:    Sara Hobbs was last seen by Dr. Excell Seltzer in 03/2019.  She fell recently and suffered a L humeral fracture.  She needs L shoulder fixation with Dr. Linus Salmons 11/19/2019.  She returns for surgical clearance.  She is here with her husband today.  She notes that she lost her balance due to numbness related to neuropathy.  She notes that  the pain was so intense when she broke her arm, she did pass out for a few seconds.  It sounds like she had a vagal episode.  She has not had chest pain.  She does have chronic shortness of breath related to COPD without significant change.  She sleeps on an incline chronically.  She has some swelling in her L leg that is unchanged.     Past Medical History:  Diagnosis Date  . Angina pectoris (HCC)   . Anxiety   . Arthritis    "hands, neck, back, knees, hips, ankles" (11/22/2014)  . Arthritis   . Asthma   . CAD (coronary artery disease)    a. 09/2014: LIMA to LAD, SVG to PDA, EVH via right thigh b. Cardiac Arrest 11/2015: patent LIMA-LAD, CTO if SVG-PDA, 99% stenosis of Prox-Mid Cx w/ DES placed  . CAD, multiple vessel   . Cardiac arrest (HCC) 11/25/2015  . Chest tightness   . CHF (congestive heart failure) (HCC)   . Chronic lower back pain   . Chronic shoulder pain    "right; I've got bad rotator cuff"  . COPD (chronic obstructive pulmonary disease) (HCC)   . Coronary artery disease   . Depression   . Diabetes mellitus with complication (HCC)   . Diabetes mellitus without complication (HCC)   . Essential hypertension 05/20/2018  . GERD (gastroesophageal reflux disease)   . Headache    "  monthly" (11/22/2014)  . History of blood transfusion    "when I had my 1st youngun; when I had MI"  . History of hiatal hernia   . History of stomach ulcers   . Hyperlipidemia   . Hypothyroidism    PMH:  . IDDM (insulin dependent diabetes mellitus)   . Incidental lung nodule, > 73mm and < 109mm 11/22/2014   Small non-calcified nodules left lung  . Myocardial infarction (HCC) 11/2013   PCI w/ drug eluting stent LCx  . Neuropathy   . Neuropathy due to secondary diabetes (HCC)   . Wears glasses     Current Medications: Current Meds  Medication Sig  . albuterol (PROVENTIL HFA;VENTOLIN HFA) 108 (90 BASE) MCG/ACT inhaler Inhale 2 puffs into the lungs every 6 (six) hours as needed for wheezing or  shortness of breath.  Marland Kitchen atorvastatin (LIPITOR) 40 MG tablet TAKE 1 TABLET BY MOUTH ONCE DAILY AT 6 PM  . BRILINTA 60 MG TABS tablet Take 1 tablet by mouth twice daily  . carvedilol (COREG) 3.125 MG tablet TAKE 1 TABLET BY MOUTH TWICE DAILY WITH MEALS  . fenofibrate 54 MG tablet Take 54 mg by mouth daily.  . Fluticasone-Salmeterol (ADVAIR) 100-50 MCG/DOSE AEPB Inhale 1 puff into the lungs 2 (two) times daily as needed (shortness of breath).   . insulin aspart (NOVOLOG FLEXPEN) 100 UNIT/ML FlexPen Inject 0-5 Units into the skin daily. Based on sliding scale  . insulin glargine (LANTUS) 100 unit/mL SOPN Inject 50 Units into the skin 2 (two) times daily. 60 units in the morning and 50 units at night  . INVOKANA 100 MG TABS tablet Take 100 mg by mouth daily.  Marland Kitchen levothyroxine (EUTHYROX) 50 MCG tablet Take by mouth.  Marland Kitchen lisinopril (ZESTRIL) 2.5 MG tablet Take 1 tablet by mouth once daily  . nitroGLYCERIN (NITROSTAT) 0.4 MG SL tablet DISSOLVE ONE TABLET UNDER THE TONGUE EVERY 5 MINUTES AS NEEDED FOR CHEST PAIN.  DO NOT EXCEED A TOTAL OF 3 DOSES IN 15 MINUTES NOW  . OXYCODONE ER PO Take 1 tablet by mouth every 8 (eight) hours as needed (PAIN).   Marland Kitchen tiotropium (SPIRIVA) 18 MCG inhalation capsule Place 18 mcg into inhaler and inhale daily as needed (shortness of breath).      Allergies:   Wellbutrin [bupropion]   Social History   Tobacco Use  . Smoking status: Current Every Day Smoker    Packs/day: 2.00    Years: 41.00    Pack years: 82.00    Types: Cigarettes  . Smokeless tobacco: Never Used  Substance Use Topics  . Alcohol use: Not Currently  . Drug use: No     Family Hx: The patient's She was adopted. Family history is unknown by patient.  ROS   EKGs/Labs/Other Test Reviewed:    EKG:  EKG is not ordered today.   She is unable to lift her L arm in order to get an ECG. Her ECG from 03/30/2019 was reviewed and demonstrated normal sinus rhythm, HR 77, normal axis, Lat TW inversions, QTc 418  - this was similar to the tracing in 2019.  Recent Labs: No results found for requested labs within last 8760 hours.   Recent Lipid Panel Lab Results  Component Value Date/Time   CHOL 140 01/31/2016 07:43 AM   TRIG 283 (H) 01/31/2016 07:43 AM   HDL 31 (L) 01/31/2016 07:43 AM   CHOLHDL 4.5 01/31/2016 07:43 AM   LDLCALC 52 01/31/2016 07:43 AM   LDLDIRECT 152.0 04/20/2015 02:50  PM    Physical Exam:    VS:  BP (!) 102/54   Pulse 87   Ht 5\' 2"  (1.575 m)   Wt 226 lb (102.5 kg)   SpO2 95%   BMI 41.34 kg/m     Wt Readings from Last 3 Encounters:  11/17/19 226 lb (102.5 kg)  11/08/19 228 lb (103.4 kg)  03/27/19 222 lb 3.2 oz (100.8 kg)     Constitutional:      Appearance: Healthy appearance. Not in distress.  Pulmonary:     Breath sounds: No wheezing. No rales.  Cardiovascular:     Normal rate. Regular rhythm.     Murmurs: There is no murmur.  Edema:    Ankle: trace edema of the left ankle. Abdominal:     Palpations: Abdomen is soft.  Musculoskeletal:     Cervical back: Neck supple. Skin:    General: Skin is warm and dry.  Neurological:     General: No focal deficit present.     Mental Status: Alert and oriented to person, place and time.     Cranial Nerves: Cranial nerves are intact.       ASSESSMENT & PLAN:    1. Preoperative cardiovascular examination Her perioperative risk of major cardiac event is high at 6.6% according to the revised cardiac risk index (RCRI).  She is limited by chronic back pain and her functional status is poor (3.63 METs according to the [facility] Activity Status Index).  Ideally, we should have her undergo a nuclear stress test for risk stratification.  However, she is unable to lift her L arm away from her body without significant pain.  Therefore, we are unable to get a nuclear stress test at this time.  Therefore, I think she can proceed with her surgery.  She will be at intermediate risk for perioperative major cardiac events.  It has been > 3  years since her last intervention.  Therefore, she may hold her Brilinta for 3 days prior to her procedure.  I reviewed her case with Dr. Curt Bears (attending MD), who agreed.  -Proceed at intermediate risk for CV events  -She may hold Brilinta for 3 days, then resume ASAP post op  -If bleeding risk is acceptable w/ ASA, would rec ASA while off of Brilinta (81 mg)  2. Coronary artery disease involving native coronary artery of native heart with angina pectoris (HCC) S/p CABG c/b dehiscence and fat necrosis requiring wound VAC.  She had a PEA arrest in 3/17 secondary to ACS and underwent PCI with DES to native LCx.  Her cath in 10/17 demonstrated a patent stent in the LCx and patent L-LAD.  Her S-PDA was chronically occluded.  Continue Ticagrelor, Atorvastatin, Carvedilol, Lisinopril.  FU in 6 mos.   3. Ischemic cardiomyopathy EF 45-50 by echocardiogram in 3/17.  Continue beta-blocker, ACE inhibitor.  No signs of volume excess.   4. Essential hypertension The patient's blood pressure is controlled on her current regimen.  Continue current therapy.    5. Mixed hyperlipidemia LDL optimal on most recent lab work.  Continue current Rx.    6. Tobacco abuse I have advised her to quit.      Dispo:  Return in about 6 months (around 05/19/2020) for Routine Follow Up, w/ Dr. Burt Knack, or Richardson Dopp, PA-C, in person.   Medication Adjustments/Labs and Tests Ordered: Current medicines are reviewed at length with the patient today.  Concerns regarding medicines are outlined above.  Tests Ordered: No orders of the  defined types were placed in this encounter.  Medication Changes: No orders of the defined types were placed in this encounter.   Signed, Tereso Newcomer, PA-C  11/17/2019 5:16 PM    [facility] [address] [place], [place], [place]  [address] Phone: [phone]; Fax: [phone]

## 2019-11-17 NOTE — Patient Instructions (Signed)
Medication Instructions:   Your physician recommends that you continue on your current medications as directed. Please refer to the Current Medication list given to you today.  *If you need a refill on your cardiac medications before your next appointment, please call your pharmacy*  Lab Work:  None ordered today  Testing/Procedures:  None ordered today  Follow-Up: At [facility], you and your health needs are our priority.  As part of our continuing mission to provide you with exceptional heart care, we have created designated Provider Care Teams.  These Care Teams include your primary Cardiologist (physician) and Advanced Practice Providers (APPs -  Physician Assistants and Nurse Practitioners) who all work together to provide you with the care you need, when you need it.  We recommend signing up for the patient portal called "MyChart".  Sign up information is provided on this After Visit Summary.  MyChart is used to connect with patients for Virtual Visits (Telemedicine).  Patients are able to view lab/test results, encounter notes, upcoming appointments, etc.  Non-urgent messages can be sent to your provider as well.   To learn more about what you can do with MyChart, go to [email or web address].    Your next appointment:   6 month(s)  The format for your next appointment:   In Person  Provider:   You may see Michael Cooper, MD or Scott Weaver, PA-C  

## 2019-11-18 ENCOUNTER — Telehealth: Payer: Self-pay | Admitting: Physician Assistant

## 2019-11-18 NOTE — Telephone Encounter (Signed)
Spoke with Dr. Theophilus Bones.  The patient may take ASA while off of Brilinta.  He will start her back on Brilinta on POD#1. I called the patient and told her to start ASA 81 mg once daily.  Once she starts back on Brilinta, she can stop ASA. Tereso Newcomer, PA-C    11/18/2019 2:32 PM

## 2019-12-01 ENCOUNTER — Other Ambulatory Visit: Payer: Self-pay | Admitting: Physician Assistant

## 2019-12-01 DIAGNOSIS — I10 Essential (primary) hypertension: Secondary | ICD-10-CM

## 2019-12-01 DIAGNOSIS — I255 Ischemic cardiomyopathy: Secondary | ICD-10-CM

## 2019-12-02 MED ORDER — LISINOPRIL 2.5 MG PO TABS: 2.5000 mg | ORAL_TABLET | Freq: Every day | ORAL | 3 refills | Status: DC

## 2020-02-08 ENCOUNTER — Other Ambulatory Visit: Payer: Self-pay | Admitting: Physician Assistant

## 2020-05-01 ENCOUNTER — Other Ambulatory Visit: Payer: Self-pay | Admitting: Physician Assistant

## 2020-05-07 ENCOUNTER — Other Ambulatory Visit: Payer: Self-pay | Admitting: Physician Assistant

## 2020-05-07 DIAGNOSIS — E785 Hyperlipidemia, unspecified: Secondary | ICD-10-CM

## 2020-05-10 ENCOUNTER — Other Ambulatory Visit: Payer: Self-pay

## 2020-05-10 DIAGNOSIS — E785 Hyperlipidemia, unspecified: Secondary | ICD-10-CM

## 2020-05-10 MED ORDER — ATORVASTATIN CALCIUM 40 MG PO TABS: 40.0000 mg | ORAL_TABLET | Freq: Every day | ORAL | 6 refills | Status: DC

## 2020-05-26 ENCOUNTER — Other Ambulatory Visit: Payer: Self-pay | Admitting: Physician Assistant

## 2020-07-11 ENCOUNTER — Other Ambulatory Visit: Payer: Self-pay | Admitting: Pain Medicine

## 2020-07-11 DIAGNOSIS — M4726 Other spondylosis with radiculopathy, lumbar region: Secondary | ICD-10-CM

## 2020-07-27 ENCOUNTER — Other Ambulatory Visit: Payer: Self-pay

## 2020-07-27 ENCOUNTER — Encounter: Payer: Self-pay | Admitting: Cardiovascular Disease

## 2020-07-27 ENCOUNTER — Ambulatory Visit (INDEPENDENT_AMBULATORY_CARE_PROVIDER_SITE_OTHER): Payer: BC Managed Care – PPO | Admitting: Cardiovascular Disease

## 2020-07-27 VITALS — BP 120/80 | HR 71 | Ht 62.0 in | Wt 215.4 lb

## 2020-07-27 DIAGNOSIS — Z720 Tobacco use: Secondary | ICD-10-CM

## 2020-07-27 DIAGNOSIS — R42 Dizziness and giddiness: Secondary | ICD-10-CM

## 2020-07-27 DIAGNOSIS — I255 Ischemic cardiomyopathy: Secondary | ICD-10-CM

## 2020-07-27 DIAGNOSIS — E782 Mixed hyperlipidemia: Secondary | ICD-10-CM

## 2020-07-27 DIAGNOSIS — I25119 Atherosclerotic heart disease of native coronary artery with unspecified angina pectoris: Secondary | ICD-10-CM

## 2020-07-27 DIAGNOSIS — R55 Syncope and collapse: Secondary | ICD-10-CM

## 2020-07-27 NOTE — Patient Instructions (Signed)
Medication Instructions:  Your provider recommends that you continue on your current medications as directed. Please refer to the Current Medication list given to you today.   *If you need a refill on your cardiac medications before your next appointment, please call your pharmacy*  Testing/Procedures: Your provider has requested that you have a lexiscan myoview. For further information please visit [email or web address] Please follow instruction sheet, as given.  Your provider has requested that you have an echocardiogram. Echocardiography is a painless test that uses sound waves to create images of your heart. It provides your doctor with information about the size and shape of your heart and how well your heart's chambers and valves are working. This procedure takes approximately one hour. There are no restrictions for this procedure.   Your physician has requested that you have a carotid duplex. This test is an ultrasound of the carotid arteries in your neck. It looks at blood flow through these arteries that supply the brain with blood. Allow one hour for this exam. There are no restrictions or special instructions.  Follow-Up: At [facility], you and your health needs are our priority.  As part of our continuing mission to provide you with exceptional heart care, we have created designated Provider Care Teams.  These Care Teams include your primary Cardiologist (physician) and Advanced Practice Providers (APPs -  Physician Assistants and Nurse Practitioners) who all work together to provide you with the care you need, when you need it. Your next appointment:   6 month(s) The format for your next appointment:   In Person Provider:   Tereso Newcomer, PA-C

## 2020-07-27 NOTE — Progress Notes (Signed)
Cardiology Office Note:    Date:  07/29/2020   ID:  Sara Hobbs, DOB Mar 15, 1961, MRN [id number]  PCP:  Alferd Apa, MD  [facility] Cardiologist:  Tonny Bollman, MD  [facility] Electrophysiologist:  None   Referring MD: Alferd Apa, MD   Chief Complaint  Patient presents with  . Chest Pain  . Coronary Artery Disease    History of Present Illness:    Sara Hobbs is a 59 y.o. female with a hx of:  Coronary artery disease  ? S/p CABG ? S/p PEA arrest 3/17 >> s/p DES to LCx ? Myoview 8/17: inf scar with mod peri-infarct ischemia; EF 49 ? Cath 10/17: L-LAD patent, S-PDA chronic 100, LCx stent patent >> Med Rx  Ischemic CM  ? Echocardiogram 3/17: EF 45-50, mild MR, sept and post-lat HK  COPD  Diabetes mellitus   Hypertension   Hyperlipidemia   Tobacco use  The patient is here with her husband today.  She does not feel well.  She has been experiencing dizziness all day.  She describes this as a room spinning sensation and she has had this intermittently in the past.  It generally last 24 to 48 hours and then resolved without specific intervention.  She denies a postural component.  She has not had frank syncope.  She continues to experience intermittent chest pains that sometimes feels sharp and other times feel like a dull sensation in the center and left chest.  She has not required nitroglycerin recently.  She has chronic shortness of breath and continues to smoke 2 packs of cigarettes per day.  She has had leg swelling without orthopnea or PND.  Reports compliance with her medications.  Past Medical History:  Diagnosis Date  . Angina pectoris (HCC)   . Anxiety   . Arthritis    "hands, neck, back, knees, hips, ankles" (11/22/2014)  . Arthritis   . Asthma   . CAD (coronary artery disease)    a. 09/2014: LIMA to LAD, SVG to PDA, EVH via right thigh b. Cardiac Arrest 11/2015: patent LIMA-LAD, CTO if SVG-PDA, 99% stenosis of Prox-Mid Cx w/ DES placed  .  CAD, multiple vessel   . Cardiac arrest (HCC) 11/25/2015  . Chest tightness   . CHF (congestive heart failure) (HCC)   . Chronic lower back pain   . Chronic shoulder pain    "right; I've got bad rotator cuff"  . COPD (chronic obstructive pulmonary disease) (HCC)   . Coronary artery disease   . Depression   . Diabetes mellitus with complication (HCC)   . Diabetes mellitus without complication (HCC)   . Essential hypertension 05/20/2018  . GERD (gastroesophageal reflux disease)   . Headache    "monthly" (11/22/2014)  . History of blood transfusion    "when I had my 1st youngun; when I had MI"  . History of hiatal hernia   . History of stomach ulcers   . Hyperlipidemia   . Hypothyroidism    PMH:  . IDDM (insulin dependent diabetes mellitus)   . Incidental lung nodule, > 96mm and < 67mm 11/22/2014   Small non-calcified nodules left lung  . Myocardial infarction (HCC) 11/2013   PCI w/ drug eluting stent LCx  . Neuropathy   . Neuropathy due to secondary diabetes (HCC)   . Wears glasses     Past Surgical History:  Procedure Laterality Date  . ABDOMINAL HYSTERECTOMY  1990's  . ABDOMINAL HYSTERECTOMY    . APPLICATION OF WOUND  VAC N/A 11/23/2014   Procedure: POSSIBLE APPLICATION OF WOUND VAC;  Surgeon: Purcell Nails, MD;  Location: [facility] OR;  Service: Thoracic;  Laterality: N/A;  . BREAST SURGERY Left 1990's   breast duct surgery  . CARDIAC CATHETERIZATION  09/01/14  . CARDIAC CATHETERIZATION N/A 11/25/2015   Procedure: Left Heart Cath and Cors/Grafts Angiography;  Surgeon: Tonny Bollman, MD;  Location[facility] INVASIVE CV LAB;  Service: Cardiovascular;  Laterality: N/A;  . CARDIAC CATHETERIZATION N/A 11/25/2015   Procedure: Coronary Stent Intervention;  Surgeon: Tonny Bollman, MD;  Location: [facility] INVASIVE CV LAB;  Service: Cardiovascular;  Laterality: N/A;  . CARDIAC CATHETERIZATION N/A 06/19/2016   Procedure: Left Heart Cath and Cors/Grafts Angiography;  Surgeon: Peter M Swaziland, MD;  Location:  [facility] INVASIVE CV LAB;  Service: Cardiovascular;  Laterality: N/A;  . CARDIAC SURGERY    . CESAREAN SECTION  1982; 1984  . CESAREAN SECTION    . COLONOSCOPY    . CORONARY ANGIOPLASTY WITH STENT PLACEMENT  11/11/2013   Drug-eluting stent in LCx  . CORONARY ARTERY BYPASS GRAFT N/A 09/22/2014   Procedure: CORONARY ARTERY BYPASS GRAFTING (CABG);  Surgeon: Purcell Nails, MD;  Location: [facility] OR;  Service: Open Heart Surgery;  Laterality: N/A;  Times 2 using left internal mammary artery and endoscopically harvested right saphenous vein  . ESOPHAGOGASTRODUODENOSCOPY    . GASTRIC RESECTION  1990's   bezoar; "for reflux"  . LAPAROSCOPIC CHOLECYSTECTOMY  2000  . STERNAL WOUND DEBRIDEMENT N/A 11/23/2014   Procedure: SUPERFICIAL STERNAL WOUND DEBRIDEMENT;  Surgeon: Purcell Nails, MD;  Location: [facility] OR;  Service: Thoracic;  Laterality: N/A;  . TEE WITHOUT CARDIOVERSION N/A 09/22/2014   Procedure: TRANSESOPHAGEAL ECHOCARDIOGRAM (TEE);  Surgeon: Purcell Nails, MD;  Location[facility] OR;  Service: Open Heart Surgery;  Laterality: N/A;    Current Medications: Current Meds  Medication Sig  . albuterol (PROVENTIL HFA;VENTOLIN HFA) 108 (90 BASE) MCG/ACT inhaler Inhale 2 puffs into the lungs every 6 (six) hours as needed for wheezing or shortness of breath.  Marland Kitchen atorvastatin (LIPITOR) 40 MG tablet Take 1 tablet (40 mg total) by mouth daily at 6 PM.  . BRILINTA 60 MG TABS tablet Take 1 tablet by mouth twice daily  . carvedilol (COREG) 3.125 MG tablet Take 1 tablet (3.125 mg total) by mouth 2 (two) times daily with a meal.  . dapagliflozin propanediol (FARXIGA) 10 MG TABS tablet Take by mouth.  . fenofibrate 54 MG tablet Take 54 mg by mouth daily.  . Fluticasone-Salmeterol (ADVAIR) 100-50 MCG/DOSE AEPB Inhale 1 puff into the lungs 2 (two) times daily as needed (shortness of breath).   . gabapentin (NEURONTIN) 100 MG capsule Take by mouth.  . insulin aspart (NOVOLOG FLEXPEN) 100 UNIT/ML FlexPen Inject 0-5 Units into the  skin daily. Based on sliding scale  . insulin glargine (LANTUS) 100 unit/mL SOPN Inject 50 Units into the skin 2 (two) times daily. 60 units in the morning and 50 units at night  . INVOKANA 100 MG TABS tablet Take 100 mg by mouth daily.  Marland Kitchen levothyroxine (EUTHYROX) 50 MCG tablet Take by mouth.  Marland Kitchen lisinopril (ZESTRIL) 2.5 MG tablet Take 1 tablet (2.5 mg total) by mouth daily.  . nitroGLYCERIN (NITROSTAT) 0.4 MG SL tablet DISSOLVE ONE TABLET UNDER THE TONGUE EVERY 5 MINUTES AS NEEDED FOR CHEST PAIN.  DO NOT EXCEED A TOTAL OF 3 DOSES IN 15 MINUTES NOW  . OXYCODONE ER PO Take 1 tablet by mouth every 8 (eight) hours as needed (PAIN).   Marland Kitchen  tiotropium (SPIRIVA) 18 MCG inhalation capsule Place 18 mcg into inhaler and inhale daily as needed (shortness of breath).      Allergies:   Wellbutrin [bupropion]   Social History   Socioeconomic History  . Marital status: Married    Spouse name: Not on file  . Number of children: Not on file  . Years of education: Not on file  . Highest education level: Not on file  Occupational History  . Not on file  Tobacco Use  . Smoking status: Current Every Day Smoker    Packs/day: 2.00    Years: 41.00    Pack years: 82.00    Types: Cigarettes  . Smokeless tobacco: Never Used  Vaping Use  . Vaping Use: Never used  Substance and Sexual Activity  . Alcohol use: Not Currently  . Drug use: No  . Sexual activity: Yes  Other Topics Concern  . Not on file  Social History Narrative   ** Merged History Encounter **       Social Determinants of Health   Financial Resource Strain:   . Difficulty of Paying Living Expenses: Not on file  Food Insecurity:   . Worried About Programme researcher, broadcasting/film/video in the Last Year: Not on file  . Ran Out of Food in the Last Year: Not on file  Transportation Needs:   . Lack of Transportation (Medical): Not on file  . Lack of Transportation (Non-Medical): Not on file  Physical Activity:   . Days of Exercise per Week: Not on file  .  Minutes of Exercise per Session: Not on file  Stress:   . Feeling of Stress : Not on file  Social Connections:   . Frequency of Communication with Friends and Family: Not on file  . Frequency of Social Gatherings with Friends and Family: Not on file  . Attends Religious Services: Not on file  . Active Member of Clubs or Organizations: Not on file  . Attends Banker Meetings: Not on file  . Marital Status: Not on file     Family History: The patient's She was adopted. Family history is unknown by patient.  ROS:   Please see the history of present illness.    All other systems reviewed and are negative.  EKGs/Labs/Other Studies Reviewed:    The following studies were reviewed today: Cardiac Cath 06-19-2016: Conclusion    Prox Cx to Mid Cx lesion, 0 %stenosed at prior stent site.  Prox LAD lesion, 80 %stenosed.  Ost 1st Sept lesion, 100 %stenosed.  Mid RCA lesion, 100 %stenosed.  SVG to ther RCA is 100% occluded.  LIMA to the LAD is widely patent  Ost 2nd Mrg to 2nd Mrg lesion, 35 %stenosed.  There is mild left ventricular systolic dysfunction.  LV end diastolic pressure is normal.  The left ventricular ejection fraction is 45-50% by visual estimate.  1st Diag lesion, 90 %stenosed.   1. Severe 2 vessel obstructive CAD 2. Patent stents in the proximal to mid LCx 3. Patent LIMA to the LAD 4. Occluded SVG to RCA. This is chronic 5. Mild LV dysfunction. 6. Normal LV EDP  Plan: potential areas of ischemia include the chronic occlusion of the RCA and the first diagonal. These findings are unchanged from prior cardiac cath so are unlikely to explain her acute chest pain symptoms. Recommend continued medical management.   Coronary Findings  Diagnostic Dominance: Right Left Main  Vessel is angiographically normal.  Left Anterior Descending  .  First  Diagonal Branch  The lesion is segmental.  First Septal Branch  .  Left Circumflex  Prox Cx to  Mid Cx lesion with no stenosis was previously treated.  Second [employer]  .  Right Coronary Artery  Collaterals  Dist RCA filled by collaterals from 2nd Sept.    .  Inferior Septal  Collaterals  Inf Sept filled by collaterals from 1st Sept.    Single Graft Graft To RPDA  100% occlusion, appears chronic  .  LIMA LIMA Graft To Dist LAD  LIMA. Widely patent LIMA-LAD  Intervention  No interventions have been documented. Wall Motion             Left Heart  Left Ventricle The left ventricular size is normal. There is mild left ventricular systolic dysfunction. LV end diastolic pressure is normal. The left ventricular ejection fraction is 45-50% by visual estimate. There are LV function abnormalities due to segmental dysfunction.  Coronary Diagrams  Diagnostic Dominance: Right    EKG:  EKG is ordered today.  The ekg ordered today demonstrates normal sinus rhythm 71 bpm left axis deviation, age-indeterminate inferior infarct  Recent Labs: No results found for requested labs within last 8760 hours.  Recent Lipid Panel    Component Value Date/Time   CHOL 140 01/31/2016 0743   TRIG 283 (H) 01/31/2016 0743   HDL 31 (L) 01/31/2016 0743   CHOLHDL 4.5 01/31/2016 0743   VLDL 57 (H) 01/31/2016 0743   LDLCALC 52 01/31/2016 0743   LDLDIRECT 152.0 04/20/2015 1450     Risk Assessment/Calculations:       Physical Exam:    VS:  BP 120/80   Pulse 71   Ht 5\' 2"  (1.575 m)   Wt 215 lb 6.4 oz (97.7 kg)   SpO2 96%   BMI 39.40 kg/m     Wt Readings from Last 3 Encounters:  07/27/20 215 lb 6.4 oz (97.7 kg)  11/17/19 226 lb (102.5 kg)  11/08/19 228 lb (103.4 kg)     GEN: Obese, chronically ill-appearing woman in no acute distress HEENT: Normal NECK: No JVD; No carotid bruits LYMPHATICS: No lymphadenopathy CARDIAC: RRR, no murmurs, rubs, gallops RESPIRATORY:  Clear to auscultation without rales, wheezing or rhonchi  ABDOMEN: Soft, non-tender,  non-distended MUSCULOSKELETAL: 1+ bilateral pretibial edema; No deformity  SKIN: Warm and dry NEUROLOGIC:  Alert and oriented x 3 PSYCHIATRIC:  Normal affect   ASSESSMENT:    1. Coronary artery disease involving native coronary artery of native heart with angina pectoris (HCC)   2. Ischemic cardiomyopathy   3. Dizziness   4. Pre-syncope   5. Mixed hyperlipidemia   6. Tobacco abuse    PLAN:    In order of problems listed above:  1. The patient has anginal symptoms with typical and atypical features.  Her last heart catheterization results are reviewed.  She has an extensive history with prior CABG and PCI.  Current medical therapy includes carvedilol, lisinopril, ticagrelor, and a high intensity statin drug.  Recommend proceeding with a Lexiscan Myoview stress test for further evaluation. 2. Dyspnea likely multifactorial.  Patient with known ischemic heart disease, recommend 2D echocardiogram to assess LV/RV function and diastolic filling parameters. 3. By history seems more consistent with vertigo.  Some mixed symptoms present, favor checking a carotid ultrasound.  Otherwise continue current risk reduction measures. 4. As above, focus on fluid hydration and healthy lifestyle. 5. Continue treatment with high intensity statin drug. 6. Cessation counseling done.    Shared Decision  Making/Informed Consent        Medication Adjustments/Labs and Tests Ordered: Current medicines are reviewed at length with the patient today.  Concerns regarding medicines are outlined above.  Orders Placed This Encounter  Procedures  . MYOCARDIAL PERFUSION IMAGING  . EKG 12-Lead  . ECHOCARDIOGRAM COMPLETE  . VAS US CAROTID   No orders of the defined types were placed in this encounter.   Patient Instructions  Medication Instructions:  Your provider recommends that you continue on your current medications as directed. Please refer to the Current Medication list given to you today.   *If you need  a refill on your cardiac medications before your next appointment, please call your pharmacy*  Testing/Procedures: Your provider has requested that you have a lexiscan myoview. For further information please visit [email or web address] Please follow instruction sheet, as given.  Your provider has requested that you have an echocardiogram. Echocardiography is a painless test that uses sound waves to create images of your heart. It provides your doctor with information about the size and shape of your heart and how well your heart's chambers and valves are working. This procedure takes approximately one hour. There are no restrictions for this procedure.   Your physician has requested that you have a carotid duplex. This test is an ultrasound of the carotid arteries in your neck. It looks at blood flow through these arteries that supply the brain with blood. Allow one hour for this exam. There are no restrictions or special instructions.  Follow-Up: At [facility], you and your health needs are our priority.  As part of our continuing mission to provide you with exceptional heart care, we have created designated Provider Care Teams.  These Care Teams include your primary Cardiologist (physician) and Advanced Practice Providers (APPs -  Physician Assistants and Nurse Practitioners) who all work together to provide you with the care you need, when you need it. Your next appointment:   6 month(s) The format for your next appointment:   In Person Provider:   Tereso Newcomer, PA-C     Signed, Tonny Bollman, MD  07/29/2020 9:25 AM    [facility]

## 2020-07-29 ENCOUNTER — Encounter: Payer: Self-pay | Admitting: Cardiovascular Disease

## 2020-08-09 ENCOUNTER — Other Ambulatory Visit: Payer: Self-pay

## 2020-08-09 ENCOUNTER — Ambulatory Visit (HOSPITAL_COMMUNITY)
Admission: RE | Admit: 2020-08-09 | Discharge: 2020-08-09 | Disposition: A | Discharge status: Home / Self Care | Payer: BC Managed Care – PPO | Source: Ambulatory Visit | Attending: Cardiology | Admitting: Cardiology

## 2020-08-09 DIAGNOSIS — R55 Syncope and collapse: Secondary | ICD-10-CM

## 2020-08-09 DIAGNOSIS — R42 Dizziness and giddiness: Secondary | ICD-10-CM | POA: Diagnosis present

## 2020-08-22 ENCOUNTER — Telehealth (HOSPITAL_COMMUNITY): Payer: Self-pay | Admitting: Cardiovascular Disease

## 2020-08-22 NOTE — Telephone Encounter (Signed)
08/22/20 patient cancelled echo and myoview due to spouse has covid. SHe id not wish to reschedule at this time/LBW Orderws will be removed from the WQ and if patient calls back to reschedule we can reinstate the orders.

## 2020-08-25 ENCOUNTER — Other Ambulatory Visit (HOSPITAL_COMMUNITY): Payer: BC Managed Care – PPO

## 2020-08-25 ENCOUNTER — Encounter (HOSPITAL_COMMUNITY): Payer: BC Managed Care – PPO

## 2020-11-10 ENCOUNTER — Other Ambulatory Visit: Payer: Self-pay | Admitting: Physician Assistant

## 2020-12-11 ENCOUNTER — Other Ambulatory Visit: Payer: Self-pay | Admitting: Cardiovascular Disease

## 2020-12-11 DIAGNOSIS — E785 Hyperlipidemia, unspecified: Secondary | ICD-10-CM

## 2020-12-13 ENCOUNTER — Other Ambulatory Visit: Payer: Self-pay

## 2020-12-13 DIAGNOSIS — E785 Hyperlipidemia, unspecified: Secondary | ICD-10-CM

## 2020-12-13 MED ORDER — CARVEDILOL 3.125 MG PO TABS: 3.1250 mg | ORAL_TABLET | Freq: Two times a day (BID) | ORAL | 2 refills | Status: DC

## 2020-12-13 MED ORDER — ATORVASTATIN CALCIUM 40 MG PO TABS: ORAL_TABLET | ORAL | 2 refills | Status: DC

## 2021-01-18 ENCOUNTER — Other Ambulatory Visit: Payer: Self-pay | Admitting: Cardiovascular Disease

## 2021-01-18 DIAGNOSIS — I10 Essential (primary) hypertension: Secondary | ICD-10-CM

## 2021-01-18 DIAGNOSIS — I255 Ischemic cardiomyopathy: Secondary | ICD-10-CM

## 2021-03-01 ENCOUNTER — Encounter: Payer: Self-pay | Admitting: Physician Assistant

## 2021-03-01 ENCOUNTER — Ambulatory Visit (INDEPENDENT_AMBULATORY_CARE_PROVIDER_SITE_OTHER): Payer: BC Managed Care – PPO | Admitting: Physician Assistant

## 2021-03-01 ENCOUNTER — Other Ambulatory Visit: Payer: Self-pay

## 2021-03-01 VITALS — BP 136/70 | HR 97 | Ht 62.0 in | Wt 200.4 lb

## 2021-03-01 DIAGNOSIS — I255 Ischemic cardiomyopathy: Secondary | ICD-10-CM | POA: Diagnosis not present

## 2021-03-01 DIAGNOSIS — R0602 Shortness of breath: Secondary | ICD-10-CM

## 2021-03-01 DIAGNOSIS — I25119 Atherosclerotic heart disease of native coronary artery with unspecified angina pectoris: Secondary | ICD-10-CM | POA: Diagnosis not present

## 2021-03-01 DIAGNOSIS — E782 Mixed hyperlipidemia: Secondary | ICD-10-CM

## 2021-03-01 DIAGNOSIS — R072 Precordial pain: Secondary | ICD-10-CM

## 2021-03-01 DIAGNOSIS — D751 Secondary polycythemia: Secondary | ICD-10-CM

## 2021-03-01 DIAGNOSIS — Z720 Tobacco use: Secondary | ICD-10-CM

## 2021-03-01 DIAGNOSIS — I10 Essential (primary) hypertension: Secondary | ICD-10-CM

## 2021-03-01 NOTE — Patient Instructions (Addendum)
Medication Instructions:   Your physician recommends that you continue on your current medications as directed. Please refer to the Current Medication list given to you today.  *If you need a refill on your cardiac medications before your next appointment, please call your pharmacy*   Lab Work:  TODAY!!!!!  PRO BNP  If you have labs (blood work) drawn today and your tests are completely normal, you will receive your results only by: MyChart Message (if you have MyChart) OR A paper copy in the mail If you have any lab test that is abnormal or we need to change your treatment, we will call you to review the results.   Testing/Procedures: Your physician has requested that you have an echocardiogram. Monday, July 25 @ 2:05. Echocardiography is a painless test that uses sound waves to create images of your heart. It provides your doctor with information about the size and shape of your heart and how well your heart's chambers and valves are working. This procedure takes approximately one hour. There are no restrictions for this procedure.  You are scheduled for a Myocardial Perfusion Imaging Study on Tuesday, July 19  at  10:30 am      .   Please arrive 15 minutes prior to your appointment time for registration and insurance purposes.   The test will take approximately 3 to 4 hours to complete; you may bring reading material. If someone comes with you to your appointment, they will need to remain in the main lobby due to limited space in the testing area.   How to prepare for your Myocardial Perfusion test:   Do not eat or drink 3 hours prior to your test, except you may have water.    Do not consume products containing caffeine (regular or decaffeinated) 12 hours prior to your test (ex: coffee, chocolate, soda, tea)   Do bring a list of your current medications with you. If not listed below, you may take your medications as normal.   Bring any held medication to your appointment, as you  may be required to take it once the test is complete.   Do wear comfortable clothes (no dresses or overalls) and walking shoes. Tennis shoes are preferred. No heels or open toed shoes.  Do not wear perfume, or lotions (deodorant is allowed).   If these instructions are not followed, you test will have to be rescheduled.   Please report to [address] for your test. If you have questions or concerns about your appointment, please call the Nuclear Lab at #[phone].  If you cannot keep your appointment, please provide 24 hour notification to the Nuclear lab to avoid a possible $50 charge to your account.       Follow-Up: At [facility], you and your health needs are our priority.  As part of our continuing mission to provide you with exceptional heart care, we have created designated Provider Care Teams.  These Care Teams include your primary Cardiologist (physician) and Advanced Practice Providers (APPs -  Physician Assistants and Nurse Practitioners) who all work together to provide you with the care you need, when you need it.  We recommend signing up for the patient portal called "MyChart".  Sign up information is provided on this After Visit Summary.  MyChart is used to connect with patients for Virtual Visits (Telemedicine).  Patients are able to view lab/test results, encounter notes, upcoming appointments, etc.  Non-urgent messages can be sent to your provider as well.  To learn more about what you can do with MyChart, go to [email or web address].    Your next appointment:   6 month(s) with Dr.Cooper on Wednesday, December 7 @ 1:40 pm.   The format for your next appointment:   In Person  Provider:   Tonny Bollman, MD   Other Instructions You have been referred to Hematology.

## 2021-03-01 NOTE — Progress Notes (Addendum)
Cardiology Office Note:    Date:  03/01/2021   ID:  Virl Diamond, DOB August 23, 1961, MRN [id number]  PCP:  Sara Apa, MD   [facility] Providers Cardiologist:  Sara Bollman, MD Cardiology APP:  [facility]      Referring MD: Sara Apa, MD   Chief Complaint:  Follow-up (CAD)    Patient Profile:    Sara Hobbs is a 60 y.o. female with:  Coronary artery disease S/p CABG S/p PEA arrest 3/17 >> s/p DES to LCx Myoview 8/17: inf scar with mod peri-infarct ischemia; EF 49 Cath 10/17: L-LAD patent, S-PDA chronic 100, LCx stent patent >> Med Rx Ischemic CM Echocardiogram 3/17: EF 45-50, mild MR, sept and post-lat HK COPD Diabetes mellitus Peripheral neuropathy Hypertension Hyperlipidemia Tobacco use GERD   Prior CV studies: Carotid US 08/09/2020 Bilateral ICA 1-39  Cardiac catheterization 06/19/16  LM normal LAD proximal 80; D1 90; first septal branch ostial 100 LCx proximal stent patent; OM2 35 RCA mid 100 SVG-RPDA 100 LIMA-distal LAD patent EF 45-50 Medical therapy   Nuclear Stress Test 04/23/16 Moderate sized inferior wall infarct from apex to base with moderate peri infarct ischemia EF 49% with inferobasal hypokinesis   Echo 11/26/15 Distal septal and posterior lateral HK, EF 45-50%, mild MR    History of Present Illness: Sara Hobbs was last seen by Dr. Excell Seltzer in 11/21.  She had symptoms of angina as well as shortness of breath.  Recommendation was to proceed with echocardiogram and Lexiscan Myoview.  She also had dizziness and a carotid ultrasound was obtained and demonstrated minimal bilateral plaque (1-39% stenosis).  Review of her chart indicates she called in to cancel her stress test and echocardiogram when her husband was diagnosed with COVID.  She did not reschedule.  She returns for follow-up.  She is here with her daughter.  Her husband contracted COVID-19 in Sep 08, 2023 and ultimately passed away from this.  She never was able to  reschedule her stress test.  She continues to have chest discomfort from time to time.  She has exertional chest discomfort.  It sometimes radiates down into her abdomen.  She sometimes feels it is gas or indigestion.  She has symptoms at rest at times.  She is chronically short of breath.  Overall, this does not seem to be any worse.  She has not had orthopnea, syncope.  She does have some difficulty with balance as well as vertigo.  She does have lower extremity swelling.    Past Medical History:  Diagnosis Date   Angina pectoris (HCC)    Anxiety    Arthritis    "hands, neck, back, knees, hips, ankles" (11/22/2014)   Arthritis    Asthma    CAD (coronary artery disease)    a. 09/2014: LIMA to LAD, SVG to PDA, EVH via right thigh b. Cardiac Arrest 11/2015: patent LIMA-LAD, CTO if SVG-PDA, 99% stenosis of Prox-Mid Cx w/ DES placed   CAD, multiple vessel    Cardiac arrest (HCC) 11/25/2015   Chest tightness    CHF (congestive heart failure) (HCC)    Chronic lower back pain    Chronic shoulder pain    "right; I've got bad rotator cuff"   COPD (chronic obstructive pulmonary disease) (HCC)    Coronary artery disease    Depression    Diabetes mellitus with complication (HCC)    Diabetes mellitus without complication (HCC)    Essential hypertension 05/20/2018   GERD (gastroesophageal reflux disease)  Headache    "monthly" (11/22/2014)   History of blood transfusion    "when I had my 1st youngun; when I had MI"   History of hiatal hernia    History of stomach ulcers    Hyperlipidemia    Hypothyroidism    PMH:   IDDM (insulin dependent diabetes mellitus)    Incidental lung nodule, > 13mm and < 39mm 11/22/2014   Small non-calcified nodules left lung   Myocardial infarction (HCC) 11/2013   PCI w/ drug eluting stent LCx   Neuropathy    Neuropathy due to secondary diabetes (HCC)    Wears glasses     Current Medications: Current Meds  Medication Sig   albuterol (PROVENTIL HFA;VENTOLIN HFA)  108 (90 BASE) MCG/ACT inhaler Inhale 2 puffs into the lungs every 6 (six) hours as needed for wheezing or shortness of breath.   atorvastatin (LIPITOR) 40 MG tablet TAKE 1 TABLET BY MOUTH ONCE DAILY 6 IN THE EVENING   BRILINTA 60 MG TABS tablet Take 1 tablet by mouth twice daily   carvedilol (COREG) 3.125 MG tablet Take 1 tablet (3.125 mg total) by mouth 2 (two) times daily with a meal.   Cholecalciferol (VITAMIN D3) 1.25 MG (50000 UT) CAPS Take 1 capsule by mouth once a week.   dapagliflozin propanediol (FARXIGA) 10 MG TABS tablet Take 10 mg by mouth daily.   fenofibrate 54 MG tablet Take 54 mg by mouth daily.   Fluticasone-Salmeterol (ADVAIR) 100-50 MCG/DOSE AEPB Inhale 1 puff into the lungs 2 (two) times daily as needed (shortness of breath).    gabapentin (NEURONTIN) 100 MG capsule Take 100 mg by mouth daily with breakfast.   insulin aspart (NOVOLOG) 100 UNIT/ML FlexPen Inject 0-5 Units into the skin daily. Based on sliding scale   Insulin Glargine-yfgn 100 UNIT/ML SOPN Take 40 Units by mouth 2 (two) times daily.   INVOKANA 100 MG TABS tablet Take 100 mg by mouth daily.   lisinopril (ZESTRIL) 2.5 MG tablet Take 1 tablet by mouth once daily   nitroGLYCERIN (NITROSTAT) 0.4 MG SL tablet DISSOLVE ONE TABLET UNDER THE TONGUE EVERY 5 MINUTES AS NEEDED FOR CHEST PAIN.  DO NOT EXCEED A TOTAL OF 3 DOSES IN 15 MINUTES NOW   OXYCODONE ER PO Take 1 tablet by mouth every 8 (eight) hours as needed (PAIN).    tiotropium (SPIRIVA) 18 MCG inhalation capsule Place 18 mcg into inhaler and inhale daily as needed (shortness of breath).      Allergies:   Wellbutrin [bupropion]   Social History   Tobacco Use   Smoking status: Every Day    Packs/day: 2.00    Years: 41.00    Pack years: 82.00    Types: Cigarettes   Smokeless tobacco: Never  Vaping Use   Vaping Use: Never used  Substance Use Topics   Alcohol use: Not Currently   Drug use: No     Family Hx: The patient's She was adopted. Family history  is unknown by patient.  Review of Systems  Constitutional: Negative for fever.  Gastrointestinal:  Negative for hematochezia and melena.  Genitourinary:  Negative for hematuria.    EKGs/Labs/Other Test Reviewed:    EKG:  EKG is not ordered today.  The ekg ordered today demonstrates n/a  Recent Labs: No results found for requested labs within last 8760 hours.   Recent Lipid Panel Lab Results  Component Value Date/Time   CHOL 140 01/31/2016 07:43 AM   TRIG 283 (H) 01/31/2016 07:43 AM  HDL 31 (L) 01/31/2016 07:43 AM   LDLCALC 52 01/31/2016 07:43 AM   LDLDIRECT 152.0 04/20/2015 02:50 PM    Labs obtained through Care Everywhere - personally reviewed and interpreted: 01/24/2021: Hgb A1c 7.1 01/24/2021: Creatinine 0.78, K+ 4.2, ALT 30, Hgb 17 01/24/2021: Total cholesterol 127, triglycerides 190, HDL 35, LDL 60  Risk Assessment/Calculations:      Physical Exam:    VS:  BP 136/70   Pulse 97   Ht 5\' 2"  (1.575 m)   Wt 200 lb 6.4 oz (90.9 kg)   SpO2 96%   BMI 36.65 kg/m     Wt Readings from Last 3 Encounters:  03/01/21 200 lb 6.4 oz (90.9 kg)  07/27/20 215 lb 6.4 oz (97.7 kg)  11/17/19 226 lb (102.5 kg)     Constitutional:      Appearance: Healthy appearance. Not in distress.  Neck:     Vascular: JVD normal.  Pulmonary:     Effort: Pulmonary effort is normal.     Breath sounds: No wheezing. No rales.  Cardiovascular:     Normal rate. Regular rhythm. Normal S1. Normal S2.      Murmurs: There is no murmur.  Edema:    Peripheral edema present.    Pretibial: bilateral trace edema of the pretibial area. Abdominal:     Palpations: Abdomen is soft.  Skin:    General: Skin is warm and dry.  Neurological:     Mental Status: Alert and oriented to person, place and time.     Cranial Nerves: Cranial nerves are intact.        ASSESSMENT & PLAN:    1. Coronary artery disease involving native coronary artery of native heart with angina pectoris (HCC) 2. Precordial  pain 3. Shortness of breath Prior history of CABG.  She had PEA arrest in 3/17 and ultimately underwent stenting to the LCx.  Cardiac catheterization in 2017 demonstrated patent LIMA-LAD, occluded vein graft to the PDA and patent stent to the LCx.  She was seen by Dr. 2018 in November and stress testing echocardiogram were recommended due to symptoms of chest discomfort or shortness of breath.  She never scheduled these because of her husband's illness with COVID and subsequent passing.  She continues to have the symptoms.  She has some typical and atypical features.  Her symptoms have been fairly stable.  I have recommend that we go ahead and proceed with [facility] and 2D echocardiogram.  I will also obtain a BNP.  Continue atorvastatin, ticagrelor, carvedilol.  Follow-up in 6 months.  4. Ischemic cardiomyopathy EF has been 45-50.  Continue beta-blocker, ACE inhibitor.  She is also on SGLT2 inhibitor.  Obtain BNP.  If BNP elevated, add furosemide.  5. Essential hypertension Fair control.  Continue current therapy.  6. Mixed hyperlipidemia LDL optimal on most recent lab work.  Continue current Rx.    7. Tobacco abuse Cessation has been recommended.  8. Polycythemia Hemoglobin has been elevated.  She notes that she is awaiting referral from primary care for a hematologist to rule out polycythemia vera.  She has been unable to get an appointment and requests that we refer her to a [facility] facility.  I will refer her to hematology.  Of note, I have explained to her that her elevated hemoglobin could all be related to ongoing tobacco abuse.   Shared Decision Making/Informed Consent The risks [chest pain, shortness of breath, cardiac arrhythmias, dizziness, blood pressure fluctuations, myocardial infarction, stroke/transient ischemic attack, nausea, vomiting,  allergic reaction, radiation exposure, metallic taste sensation and life-threatening complications (estimated to be 1 in 10,000)],  benefits (risk stratification, diagnosing coronary artery disease, treatment guidance) and alternatives of a nuclear stress test were discussed in detail with Ms. States and she agrees to proceed.    Dispo:  Return in about 6 months (around 08/31/2021) for [facility] 6 month follow for Dr. Excell Seltzer..   Medication Adjustments/Labs and Tests Ordered: Current medicines are reviewed at length with the patient today.  Concerns regarding medicines are outlined above.  Tests Ordered: Orders Placed This Encounter  Procedures   Pro b natriuretic peptide   Ambulatory referral to Hematology / Oncology   Cardiac Stress Test: Informed Consent Details: Physician/Practitioner Attestation; Transcribe to consent form and obtain patient signature   Myocardial Perfusion Imaging   ECHOCARDIOGRAM COMPLETE   Medication Changes: No orders of the defined types were placed in this encounter.   Signed, Tereso Newcomer, PA-C  03/01/2021 5:35 PM    [facility] [address] [place], [place], [place]  [address] Phone: [phone]; Fax: [phone]

## 2021-03-02 ENCOUNTER — Other Ambulatory Visit: Payer: Self-pay | Admitting: *Deleted

## 2021-03-02 DIAGNOSIS — I25118 Atherosclerotic heart disease of native coronary artery with other forms of angina pectoris: Secondary | ICD-10-CM

## 2021-03-02 LAB — PRO B NATRIURETIC PEPTIDE: NT-Pro BNP: 152 pg/mL (ref 0–287)

## 2021-03-02 MED ORDER — NITROGLYCERIN 0.4 MG SL SUBL: SUBLINGUAL_TABLET | SUBLINGUAL | 4 refills | Status: DC

## 2021-03-02 MED ORDER — TICAGRELOR 60 MG PO TABS: 60.0000 mg | ORAL_TABLET | Freq: Two times a day (BID) | ORAL | 2 refills | Status: DC

## 2021-03-02 NOTE — Progress Notes (Signed)
Pt has been made aware of normal result and verbalized understanding.  jw

## 2021-03-03 ENCOUNTER — Telehealth: Payer: Self-pay | Admitting: Hematology and Oncology

## 2021-03-03 NOTE — Telephone Encounter (Signed)
Received a new hem referral from [facility] for polycythemia. Sara Hobbs has been cld and scheduled to see Dr. Pamelia Hoit on 7/14 at 345pm per pt's request. Sara Hobbs is aware to arrive 15 minutes early.

## 2021-03-15 NOTE — Progress Notes (Signed)
[place] NOTE  Patient Care Team: Azell Der, MD as PCP - General (Family Medicine) Sherren Mocha, MD as PCP - Cardiology (Cardiology) Rexene Alberts, MD as Consulting Physician (Cardiothoracic Surgery) Flossie Buffy., MD as Referring Physician (Cardiology) Sherryl Barters, MD as Consulting Physician (Cardiology) Sharmon Revere as Physician Assistant (Cardiology)  CHIEF COMPLAINTS/PURPOSE OF CONSULTATION:  Erythrocytosis  HISTORY OF PRESENTING ILLNESS:  Sara Hobbs 60 y.o. female is here because of recent diagnosis of erythrocytosis. Labs on 01/24/21 showed RBC 5.26, HCT 50.1, Hg 17, PLT 193. She presents to the clinic today for initial evaluation and discussion of treatment options.  She has a longstanding history of severe COPD, COPD exacerbation, coronary artery disease and and tobacco abuse.  Over the past 5 to 6 years she has had intermittent elevation of the red blood cell count going up as high as 17.9 on the hemoglobin.  The most recent hemoglobin was 17.  She was sent to [place] for discussion of diagnosis and treatment options.  She was extremely anxious coming in thinking that she had a diagnosis of cancer.  She is accompanied by her daughter.  She will not stop smoking of marijuana.  She has severe anxiety.  I reviewed her records extensively and collaborated the history with the patient.  MEDICAL HISTORY:  Past Medical History:  Diagnosis Date   Angina pectoris ([place])    Anxiety    Arthritis    "hands, neck, back, knees, hips, ankles" (11/22/2014)   Arthritis    Asthma    CAD (coronary artery disease)    a. 09/2014: LIMA to LAD, SVG to PDA, EVH via right thigh b. Cardiac Arrest 11/2015: patent LIMA-LAD, CTO if SVG-PDA, 99% stenosis of Prox-Mid Cx w/ DES placed   CAD, multiple vessel    Cardiac arrest ([place]) 11/25/2015   Chest tightness    CHF (congestive heart failure) (HCC)    Chronic lower back pain    Chronic shoulder pain     "right; I've got bad rotator cuff"   COPD (chronic obstructive pulmonary disease) ([place])    Coronary artery disease    Depression    Diabetes mellitus with complication ([place])    Diabetes mellitus without complication ([place])    Essential hypertension 05/20/2018   GERD (gastroesophageal reflux disease)    Headache    "monthly" (11/22/2014)   History of blood transfusion    "when I had my 1st youngun; when I had MI"   History of hiatal hernia    History of stomach ulcers    Hyperlipidemia    Hypothyroidism    PMH:   IDDM (insulin dependent diabetes mellitus)    Incidental lung nodule, > 66m and < 858m3/21/2016   Small non-calcified nodules left lung   My[place] ([facility]   PCI w/ drug eluting stent LCx   Neuropathy    Neurop[place] due to secondary diabetes ([facility]   Wears glasses     SURGICAL HISTORY: Past Surgical History:  Procedure Laterality Date   ABDOMINAL HYSTERECTOMY  1990's   ABDOMINAL HYSTERECTOMY     APPLICATION OF WOUND VAC N/A 11/23/2014   Procedure: POSSIBLE APPLICATION OF WOUND VAC;  Surgeon: ClRexene AlbertsMD;  [facility] Service: Thoracic;  Laterality: N/A;   BREAST SURGERY Left 1990's   breast duct surgery   CARDIAC CATHETERIZATION  09/01/14   CARDIAC CATHETERIZATION N/A 11/25/2015   Procedure: Left Heart Cath and Cors/Grafts Angiography;  Surgeon:  Sherren Mocha, MD;  Location: [place] CV LAB;  Service: Cardiovascular;  Laterality: N/A;   CARDIAC CATHETERIZATION N/A 11/25/2015   Procedure: Coronary Stent Intervention;  Surgeon: Sherren Mocha, MD;  Location: [place] CV [facility];  Service: Cardiovascular;  Laterality: N/A;   CARDIAC CATHETERIZATION N/A 06/19/2016   Procedure: Left Heart Cath and Cors/Grafts Angiography;  Surgeon: Peter M Martinique, MD;  Location: [place] CV [facility];  Service: Cardiovascular;  Laterality: N/A;   CARDIAC SURGERY     CESAREAN SECTION  1982; [place] WITH  STENT PLACEMENT  11/11/2013   Drug-eluting stent in LCx   CORONARY ARTERY BYPASS GRAFT N/A 09/22/2014   Procedure: CORONARY ARTERY BYPASS GRAFTING (CABG);  Surgeon: Rexene Alberts, MD;  Location: [place];  Service: Open Heart Surgery;  Laterality: N/A;  Times 2 using left internal mammary artery and endoscopically harvested right saphenous vein   ESOPHAGOGASTRODUODENOSCOPY     GASTRIC RESECTION  1990's   bezoar; "for reflux"   [place] N/A 11/23/2014   Procedure: SUPERFICIAL STERNAL WOUND DEBRIDEMENT;  Surgeon: Rexene Alberts, MD;  Location: [place];  Service: Thoracic;  Laterality: N/A;   TEE WITHOUT CARDIOVERSION N/A 09/22/2014   Procedure: TRANSESOPHAGEAL ECHOCARDIOGRAM (TEE);  Surgeon: Rexene Alberts, MD;  Location: [place];  Service: Open Heart Surgery;  Laterality: N/A;    SOCIAL HISTORY: Social History   Socioeconomic History   Marital status: Married    Spouse name: Not on file   Number of children: Not on file   Years of education: Not on file   Highest education level: Not on file  Occupational History   Not on file  Tobacco Use   Smoking status: Every Day    Packs/day: 2.00    Years: 41.00    Pack years: 82.00    Types: Cigarettes   Smokeless tobacco: Never  Vaping Use   Vaping Use: Never used  Substance and Sexual Activity   Alcohol use: Not Currently   Drug use: No   Sexual activity: Yes  Other Topics Concern   Not on file  Social History Narrative   ** Merged History Encounter **       Social Determinants of Health   Financial Resource Strain: Not on file  Food Insecurity: Not on file  Transportation Needs: Not on file  Physical Activity: Not on file  Stress: Not on file  Social Connections: Not on file  Intimate Partner Violence: Not on file    FAMILY HISTORY: Family History  Adopted: Yes  Family history unknown: Yes    ALLERGIES:  is allergic to wellbutrin [bupropion].  MEDICATIONS:  Current  Outpatient Medications  Medication Sig Dispense Refill   albuterol (PROVENTIL HFA;VENTOLIN HFA) 108 (90 BASE) MCG/ACT inhaler Inhale 2 puffs into the lungs every 6 (six) hours as needed for wheezing or shortness of breath.     atorvastatin (LIPITOR) 40 MG tablet TAKE 1 TABLET BY MOUTH ONCE DAILY 6 IN THE EVENING 90 tablet 2   carvedilol (COREG) 3.125 MG tablet Take 1 tablet (3.125 mg total) by mouth 2 (two) times daily with a meal. 180 tablet 2   Cholecalciferol (VITAMIN D3) 1.25 MG (50000 UT) CAPS Take 1 capsule by mouth once a week.     dapagliflozin propanediol (FARXIGA) 10 MG TABS tablet Take 10 mg by mouth daily.     fenofibrate 54 MG tablet  Take 54 mg by mouth daily.     Fluticasone-Salmeterol (ADVAIR) 100-50 MCG/DOSE AEPB Inhale 1 puff into the lungs 2 (two) times daily as needed (shortness of breath).      gabapentin (NEURONTIN) 100 MG capsule Take 100 mg by mouth daily with breakfast.     insulin aspart (NOVOLOG) 100 UNIT/ML FlexPen Inject 0-5 Units into the skin daily. Based on sliding scale     Insulin Glargine-yfgn 100 UNIT/ML SOPN Take 40 Units by mouth 2 (two) times daily.     INVOKANA 100 MG TABS tablet Take 100 mg by mouth daily.     lisinopril (ZESTRIL) 2.5 MG tablet Take 1 tablet by mouth once daily 90 tablet 1   nitroGLYCERIN (NITROSTAT) 0.4 MG SL tablet DISSOLVE ONE TABLET UNDER THE TONGUE EVERY 5 MINUTES AS NEEDED FOR CHEST PAIN.  DO NOT EXCEED A TOTAL OF 3 DOSES IN 15 MINUTES NOW 25 tablet 4   OXYCODONE ER PO Take 1 tablet by mouth every 8 (eight) hours as needed (PAIN).      ticagrelor (BRILINTA) 60 MG TABS tablet Take 1 tablet (60 mg total) by mouth 2 (two) times daily. 180 tablet 2   tiotropium (SPIRIVA) 18 MCG inhalation capsule Place 18 mcg into inhaler and inhale daily as needed (shortness of breath).      No current facility-administered medications for this visit.    REVIEW OF SYSTEMS:   Constitutional: Denies fevers, chills or abnormal night sweats Severe COPD  with shortness of breath to exertion. Uses a wheelchair for ambulation. Dizziness while walking.  Uses a wheelchair for long distance.  All other systems were reviewed with the patient and are negative.  PHYSICAL EXAMINATION: ECOG PERFORMANCE STATUS: 1 - Symptomatic but completely ambulatory  Vitals:   03/16/21 1538 03/16/21 1543  BP: (!) 154/123 (!) 144/119  Pulse: 94   Resp: 17   Temp: 97.7 F (36.5 C)   SpO2: 97%    Filed Weights   03/16/21 1538  Weight: 198 lb 12.8 oz (90.2 kg)      LABORATORY DATA:  I have reviewed the data as listed Lab Results  Component Value Date   WBC 10.4 03/16/2021   HGB 16.8 (H) 03/16/2021   HCT 48.5 (H) 03/16/2021   MCV 93.8 03/16/2021   PLT 206 03/16/2021   Lab Results  Component Value Date   NA 133 (L) 07/28/2016   K 4.3 07/28/2016   CL 100 (L) 07/28/2016   CO2 22 07/28/2016    RADIOGRAPHIC STUDIES: I have personally reviewed the radiological reports and agreed with the findings in the report.  ASSESSMENT AND PLAN:  Polycythemia, secondary Lab review: 05/14/2018: Hemoglobin 14.7: Hematocrit 44.4  01/24/21 showed RBC 5.26, HCT 50.1, Hg 17, PLT 193  I discussed with the patient extensively the differential diagnosis of polycythemia 1. Primary polycythemia due to clonal stem cell abnormality 2. Secondary polycythemia due to cause that include hypoxia, heart or lung problems, altitude, athletics, erythropoietin producing lesion/tumors etc (patient has COPD and heart disease)  Recommendation: 1. JAK-2 mutation testing to evaluate polycythemia vera 2. Erythropoietin level    Indications for phlebotomy 1. Primary polycythemia with hematocrit over 55 2. Secondary polycythemia with severe symptoms which include strokelike symptoms, severe recurrent headaches, severe fatigue.  Patient does not have any clear-cut symptoms that would require phlebotomy at this time. If the JAK-2 mutation is normal and erythropoietin level is normal,  a bone marrow biopsy may be considered. If the erythropoietin is elevated, he will need  ultrasound of the liver and kidney for further evaluation.   Telephone visit in 3 week to discuss results   All questions were answered. The patient knows to call the clinic with any problems, questions or concerns.   Rulon Eisenmenger, MD, MPH 03/16/2021    I, Thana Ates, am acting as scribe for Nicholas Lose, MD.  I have reviewed the above documentation for accuracy and completeness, and I agree with the above.

## 2021-03-16 ENCOUNTER — Other Ambulatory Visit: Payer: Self-pay

## 2021-03-16 ENCOUNTER — Inpatient Hospital Stay: Payer: BC Managed Care – PPO

## 2021-03-16 ENCOUNTER — Telehealth: Payer: Self-pay

## 2021-03-16 ENCOUNTER — Inpatient Hospital Stay: Payer: BC Managed Care – PPO | Attending: Hematology and Oncology | Admitting: Hematology and Oncology

## 2021-03-16 DIAGNOSIS — J449 Chronic obstructive pulmonary disease, unspecified: Secondary | ICD-10-CM | POA: Diagnosis not present

## 2021-03-16 DIAGNOSIS — I2510 Atherosclerotic heart disease of native coronary artery without angina pectoris: Secondary | ICD-10-CM | POA: Diagnosis not present

## 2021-03-16 DIAGNOSIS — D751 Secondary polycythemia: Secondary | ICD-10-CM

## 2021-03-16 LAB — CBC WITH DIFFERENTIAL (CANCER CENTER ONLY)
Abs Immature Granulocytes: 0.12 10*3/uL — ABNORMAL HIGH (ref 0.00–0.07)
Basophils Absolute: 0.1 10*3/uL (ref 0.0–0.1)
Basophils Relative: 1 %
Eosinophils Absolute: 0.2 10*3/uL (ref 0.0–0.5)
Eosinophils Relative: 2 %
HCT: 48.5 % — ABNORMAL HIGH (ref 36.0–46.0)
Hemoglobin: 16.8 g/dL — ABNORMAL HIGH (ref 12.0–15.0)
Immature Granulocytes: 1 %
Lymphocytes Relative: 33 %
Lymphs Abs: 3.5 10*3/uL (ref 0.7–4.0)
MCH: 32.5 pg (ref 26.0–34.0)
MCHC: 34.6 g/dL (ref 30.0–36.0)
MCV: 93.8 fL (ref 80.0–100.0)
Monocytes Absolute: 0.9 10*3/uL (ref 0.1–1.0)
Monocytes Relative: 8 %
Neutro Abs: 5.7 10*3/uL (ref 1.7–7.7)
Neutrophils Relative %: 55 %
Platelet Count: 206 10*3/uL (ref 150–400)
RBC: 5.17 MIL/uL — ABNORMAL HIGH (ref 3.87–5.11)
RDW: 12.3 % (ref 11.5–15.5)
WBC Count: 10.4 10*3/uL (ref 4.0–10.5)
nRBC: 0 % (ref 0.0–0.2)

## 2021-03-16 NOTE — Telephone Encounter (Signed)
Spoke with the patient, detailed instructions given. She stated she would be here for her test. Asked to call back with any questions. S.Jaidyn Usery EMTP 

## 2021-03-16 NOTE — Assessment & Plan Note (Signed)
Lab review: 05/14/2018: Hemoglobin 14.7: Hematocrit 44.4  01/24/21 showed RBC 5.26, HCT 50.1, Hg 17, PLT 193  I discussed with the patient extensively the differential diagnosis of polycythemia 1. Primary polycythemia due to clonal stem cell abnormality 2. Secondary polycythemia due to cause that include hypoxia, heart or lung problems, altitude, athletics, erythropoietin producing lesion/tumors etc (patient has COPD and heart disease)  Recommendation: 1. JAK-2 mutation testing to evaluate polycythemia vera 2. Erythropoietin level    Indications for phlebotomy 1. Primary polycythemia with hematocrit over 55 2. Secondary polycythemia with severe symptoms which include strokelike symptoms, severe recurrent headaches, severe fatigue.  Patient does not have any clear-cut symptoms that would require phlebotomy at this time. If the JAK-2 mutation is normal and erythropoietin level is normal, a bone marrow biopsy may be considered. If the erythropoietin is elevated, he will need ultrasound of the liver and kidney for further evaluation.   MyChart virtual visit in 1 week to discuss results

## 2021-03-17 LAB — ERYTHROPOIETIN: Erythropoietin: 7.2 m[IU]/mL (ref 2.6–18.5)

## 2021-03-21 ENCOUNTER — Ambulatory Visit (HOSPITAL_COMMUNITY): Payer: BC Managed Care – PPO | Attending: Cardiology

## 2021-03-21 ENCOUNTER — Encounter: Payer: Self-pay | Admitting: Physician Assistant

## 2021-03-21 ENCOUNTER — Other Ambulatory Visit: Payer: Self-pay

## 2021-03-21 ENCOUNTER — Telehealth: Payer: Self-pay | Admitting: Hematology and Oncology

## 2021-03-21 DIAGNOSIS — R072 Precordial pain: Secondary | ICD-10-CM

## 2021-03-21 LAB — MYOCARDIAL PERFUSION IMAGING
LV dias vol: 46 mL (ref 46–106)
LV sys vol: 13 mL
Peak HR: 105 {beats}/min
Rest HR: 79 {beats}/min
SDS: 8
SRS: 0
SSS: 8
TID: 0.87

## 2021-03-21 MED ORDER — REGADENOSON 0.4 MG/5ML IV SOLN
0.4000 mg | Freq: Once | INTRAVENOUS | Status: AC
  Administered 2021-03-21: 0.4 mg via INTRAVENOUS

## 2021-03-21 MED ORDER — TECHNETIUM TC 99M TETROFOSMIN IV KIT
29.4000 | PACK | Freq: Once | INTRAVENOUS | Status: AC | PRN
Start: 2021-03-21 — End: 2021-03-21
  Administered 2021-03-21: 29.4 via INTRAVENOUS
  Filled 2021-03-21: qty 30

## 2021-03-21 MED ORDER — TECHNETIUM TC 99M TETROFOSMIN IV KIT
10.9000 | PACK | Freq: Once | INTRAVENOUS | Status: AC | PRN
Start: 2021-03-21 — End: 2021-03-21
  Administered 2021-03-21: 10.9 via INTRAVENOUS
  Filled 2021-03-21: qty 11

## 2021-03-21 NOTE — Telephone Encounter (Signed)
Scheduled appointment per 07/14 los. Left message.   

## 2021-03-22 ENCOUNTER — Other Ambulatory Visit: Payer: Self-pay | Admitting: *Deleted

## 2021-03-22 LAB — JAK2 (INCLUDING V617F AND EXON 12), MPL,& CALR W/RFL MPN PANEL (NGS)

## 2021-03-22 MED ORDER — ISOSORBIDE MONONITRATE ER 30 MG PO TB24: 30.0000 mg | ORAL_TABLET | Freq: Every day | ORAL | 3 refills | Status: DC

## 2021-03-27 ENCOUNTER — Other Ambulatory Visit: Payer: Self-pay

## 2021-03-27 ENCOUNTER — Ambulatory Visit (HOSPITAL_COMMUNITY): Payer: BC Managed Care – PPO | Attending: Internal Medicine

## 2021-03-27 DIAGNOSIS — D751 Secondary polycythemia: Secondary | ICD-10-CM | POA: Diagnosis present

## 2021-03-27 DIAGNOSIS — I25119 Atherosclerotic heart disease of native coronary artery with unspecified angina pectoris: Secondary | ICD-10-CM | POA: Diagnosis not present

## 2021-03-27 DIAGNOSIS — Z720 Tobacco use: Secondary | ICD-10-CM | POA: Diagnosis present

## 2021-03-27 DIAGNOSIS — I255 Ischemic cardiomyopathy: Secondary | ICD-10-CM | POA: Diagnosis not present

## 2021-03-27 DIAGNOSIS — R072 Precordial pain: Secondary | ICD-10-CM | POA: Diagnosis present

## 2021-03-27 DIAGNOSIS — I10 Essential (primary) hypertension: Secondary | ICD-10-CM | POA: Insufficient documentation

## 2021-03-27 DIAGNOSIS — R0602 Shortness of breath: Secondary | ICD-10-CM | POA: Diagnosis present

## 2021-03-27 DIAGNOSIS — E782 Mixed hyperlipidemia: Secondary | ICD-10-CM | POA: Insufficient documentation

## 2021-03-27 LAB — ECHOCARDIOGRAM COMPLETE
Area-P 1/2: 3.17 cm2
S' Lateral: 2.4 cm

## 2021-03-27 MED ORDER — PERFLUTREN LIPID MICROSPHERE
1.0000 mL | INTRAVENOUS | Status: AC | PRN
  Administered 2021-03-27: 3 mL via INTRAVENOUS

## 2021-03-28 ENCOUNTER — Encounter: Payer: Self-pay | Admitting: Physician Assistant

## 2021-04-10 NOTE — Progress Notes (Signed)
  HEMATOLOGY-ONCOLOGY TELEPHONE VISIT PROGRESS NOTE  I connected with Sara Hobbs on 04/11/2021 at  2:15 PM EDT by telephone and verified that I am speaking with the correct person using two identifiers.  I discussed the limitations, risks, security and privacy concerns of performing an evaluation and management service by telephone and the availability of in person appointments.  I also discussed with the patient that there may be a patient responsible charge related to this service. The patient expressed understanding and agreed to proceed.   History of Present Illness: Sara Hobbs is a 60 y.o. female with above-mentioned history of erythrocytosis. Labs on 03/16/21 showed RBC 5.17, HCT 48.5, Hg 16.8, PLT 206. She presents via telephone today for follow-up.  She has COPD and heart disease.  She does not feel to do well because of both of these conditions but  Observations/Objective:     Assessment Plan:  Polycythemia, secondary Lab review: 05/14/2018: Hemoglobin 14.7: Hematocrit 44.4  01/24/21 showed RBC 5.26, HCT 50.1, Hg 17, PLT 193   I discussed with the patient extensively the differential diagnosis of polycythemia 1. Primary polycythemia due to clonal stem cell abnormality 2. Secondary polycythemia due to cause that include hypoxia, heart or lung problems, altitude, athletics, erythropoietin producing lesion/tumors etc (patient has COPD and heart disease)   Recommendation: 1. JAK-2 mutation testing to evaluate polycythemia vera: Negative on 03/22/2021.  Therefore that she does not have any myeloproliferative neoplasm. 2. Erythropoietin level: 7.2 (appropriately low)     Indications for phlebotomy severe symptoms which include strokelike symptoms, severe recurrent headaches, severe fatigue.   Patient does not have any clear-cut symptoms that would require phlebotomy at this time.  Return to clinic in 6 months with labs and follow-up    I discussed the assessment and  treatment plan with the patient. The patient was provided an opportunity to ask questions and all were answered. The patient agreed with the plan and demonstrated an understanding of the instructions. The patient was advised to call back or seek an in-person evaluation if the symptoms worsen or if the condition fails to improve as anticipated.   Total time spent: 20 mins including non-face to face time and time spent for planning, charting and coordination of care  Sabas Sous, MD 04/11/2021    I, Alda Ponder, am acting as scribe for Serena Croissant, MD.  I have reviewed the above documentation for accuracy and completeness, and I agree with the above.

## 2021-04-11 ENCOUNTER — Inpatient Hospital Stay: Payer: BC Managed Care – PPO | Attending: Hematology and Oncology | Admitting: Hematology and Oncology

## 2021-04-11 ENCOUNTER — Other Ambulatory Visit: Payer: Self-pay

## 2021-04-11 DIAGNOSIS — D751 Secondary polycythemia: Secondary | ICD-10-CM | POA: Diagnosis not present

## 2021-04-11 NOTE — Assessment & Plan Note (Signed)
Lab review: 05/14/2018: Hemoglobin 14.7: Hematocrit 44.4  01/24/21 showed RBC 5.26, HCT 50.1, Hg 17, PLT 193  I discussed with the patient extensively the differential diagnosis of polycythemia 1. Primary polycythemia due to clonal stem cell abnormality 2. Secondary polycythemia due to cause that include hypoxia, heart or lung problems, altitude, athletics, erythropoietin producing lesion/tumors etc (patient has COPD and heart disease)  Recommendation: 1. JAK-2 mutation testing to evaluate polycythemia vera: Negative on 03/22/2021 2. Erythropoietin level: 7.2 (appropriately low)    Indications for phlebotomy severe symptoms which include strokelike symptoms, severe recurrent headaches, severe fatigue.  Patient does not have any clear-cut symptoms that would require phlebotomy at this time.  Return to clinic in 3 months with labs and follow-up

## 2021-05-03 ENCOUNTER — Telehealth: Payer: Self-pay | Admitting: Hematology and Oncology

## 2021-05-03 NOTE — Telephone Encounter (Signed)
Scheduled appts called and spoke with pt confirmed appts on 2/9

## 2021-08-09 ENCOUNTER — Encounter: Payer: Self-pay | Admitting: Cardiovascular Disease

## 2021-08-09 ENCOUNTER — Ambulatory Visit (INDEPENDENT_AMBULATORY_CARE_PROVIDER_SITE_OTHER): Payer: BC Managed Care – PPO | Admitting: Cardiovascular Disease

## 2021-08-09 ENCOUNTER — Other Ambulatory Visit: Payer: Self-pay

## 2021-08-09 VITALS — BP 124/70 | HR 91 | Ht 62.0 in | Wt 198.0 lb

## 2021-08-09 DIAGNOSIS — E782 Mixed hyperlipidemia: Secondary | ICD-10-CM | POA: Diagnosis not present

## 2021-08-09 DIAGNOSIS — Z720 Tobacco use: Secondary | ICD-10-CM | POA: Diagnosis not present

## 2021-08-09 DIAGNOSIS — D751 Secondary polycythemia: Secondary | ICD-10-CM

## 2021-08-09 DIAGNOSIS — I10 Essential (primary) hypertension: Secondary | ICD-10-CM | POA: Diagnosis not present

## 2021-08-09 DIAGNOSIS — I25119 Atherosclerotic heart disease of native coronary artery with unspecified angina pectoris: Secondary | ICD-10-CM

## 2021-08-09 NOTE — Progress Notes (Signed)
Cardiology Office Note:    Date:  08/09/2021   ID:  Sara Hobbs, DOB July 24, 1961, MRN [id number]  PCP:  Alferd Apa, MD   [facility] Providers Cardiologist:  Tonny Bollman, MD Cardiology APP:  Kennon Rounds     Referring MD: Alferd Apa, MD   Chief Complaint  Patient presents with   Chest Pain     History of Present Illness:    Sara Hobbs is a 60 y.o. female with a hx of: Coronary artery disease S/p CABG S/p PEA arrest 3/17 >> s/p DES to LCx Myoview 8/17: inf scar with mod peri-infarct ischemia; EF 49 Cath 10/17: L-LAD patent, S-PDA chronic 100, LCx stent patent >> Med Rx Ischemic CM Echocardiogram 3/17: EF 45-50, mild MR, sept and post-lat HK COPD Diabetes mellitus Peripheral neuropathy Hypertension Hyperlipidemia Tobacco use GERD  The patient is here with her daughter today.  She continues to be a heavy smoker, currently smoking about 2 packs/day.  Her husband died from complications related to COVID about 6 months ago.  The patient has intermittent anginal chest pain and uses nitroglycerin at times.  There is no clear exertional component.  She has not appreciated any significant increase in her angina over the last few months.  She is chronically short of breath and this is unchanged.  No orthopnea, PND, or leg swelling.  Past Medical History:  Diagnosis Date   Angina pectoris (HCC)    Anxiety    Arthritis    "hands, neck, back, knees, hips, ankles" (11/22/2014)   Arthritis    Asthma    CAD (coronary artery disease)    a. 09/2014: LIMA to LAD, SVG to PDA, EVH via right thigh b. Cardiac Arrest 11/2015: patent LIMA-LAD, CTO if SVG-PDA, 99% stenosis of Prox-Mid Cx w/ DES placed // Myoview 7/22 EF 71, inf infarct with peri-infarct ischemia; intermediate risk   CAD, multiple vessel    Cardiac arrest (HCC) 11/25/2015   Chest tightness    CHF (congestive heart failure) (HCC)    Echocardiogram 7/22: EF 55-60, no RWMA, mod LVH, Gr 1 DD, normal  RVSF, trivial MR   Chronic lower back pain    Chronic shoulder pain    "right; I've got bad rotator cuff"   COPD (chronic obstructive pulmonary disease) (HCC)    Coronary artery disease    Depression    Diabetes mellitus with complication (HCC)    Diabetes mellitus without complication (HCC)    Essential hypertension 05/20/2018   GERD (gastroesophageal reflux disease)    Headache    "monthly" (11/22/2014)   History of blood transfusion    "when I had my 1st youngun; when I had MI"   History of hiatal hernia    History of stomach ulcers    Hyperlipidemia    Hypothyroidism    PMH:   IDDM (insulin dependent diabetes mellitus)    Incidental lung nodule, > 3mm and < 8mm 11/22/2014   Small non-calcified nodules left lung   Myocardial infarction (HCC) 11/2013   PCI w/ drug eluting stent LCx   Neuropathy    Neuropathy due to secondary diabetes (HCC)    Wears glasses     Past Surgical History:  Procedure Laterality Date   ABDOMINAL HYSTERECTOMY  1990's   ABDOMINAL HYSTERECTOMY     APPLICATION OF WOUND VAC N/A 11/23/2014   Procedure: POSSIBLE APPLICATION OF WOUND VAC;  Surgeon: Purcell Nails, MD;  Location: [facility] OR;  Service: Thoracic;  Laterality: N/A;  BREAST SURGERY Left 1990's   breast duct surgery   CARDIAC CATHETERIZATION  09/01/14   CARDIAC CATHETERIZATION N/A 11/25/2015   Procedure: Left Heart Cath and Cors/Grafts Angiography;  Surgeon: Tonny Bollman, MD;  Location: [facility] INVASIVE CV LAB;  Service: Cardiovascular;  Laterality: N/A;   CARDIAC CATHETERIZATION N/A 11/25/2015   Procedure: Coronary Stent Intervention;  Surgeon: Tonny Bollman, MD;  Location: [facility] INVASIVE CV LAB;  Service: Cardiovascular;  Laterality: N/A;   CARDIAC CATHETERIZATION N/A 06/19/2016   Procedure: Left Heart Cath and Cors/Grafts Angiography;  Surgeon: Peter M Swaziland, MD;  Location[facility] INVASIVE CV LAB;  Service: Cardiovascular;  Laterality: N/A;   CARDIAC SURGERY     CESAREAN SECTION  1982; 1984   CESAREAN  SECTION     COLONOSCOPY     CORONARY ANGIOPLASTY WITH STENT PLACEMENT  11/11/2013   Drug-eluting stent in LCx   CORONARY ARTERY BYPASS GRAFT N/A 09/22/2014   Procedure: CORONARY ARTERY BYPASS GRAFTING (CABG);  Surgeon: Purcell Nails, MD;  Location: [facility] OR;  Service: Open Heart Surgery;  Laterality: N/A;  Times 2 using left internal mammary artery and endoscopically harvested right saphenous vein   ESOPHAGOGASTRODUODENOSCOPY     GASTRIC RESECTION  1990's   bezoar; "for reflux"   LAPAROSCOPIC CHOLECYSTECTOMY  2000   STERNAL WOUND DEBRIDEMENT N/A 11/23/2014   Procedure: SUPERFICIAL STERNAL WOUND DEBRIDEMENT;  Surgeon: Purcell Nails, MD;  Location: [facility] OR;  Service: Thoracic;  Laterality: N/A;   TEE WITHOUT CARDIOVERSION N/A 09/22/2014   Procedure: TRANSESOPHAGEAL ECHOCARDIOGRAM (TEE);  Surgeon: Purcell Nails, MD;  Location: [facility] OR;  Service: Open Heart Surgery;  Laterality: N/A;    Current Medications: Current Meds  Medication Sig   albuterol (PROVENTIL HFA;VENTOLIN HFA) 108 (90 BASE) MCG/ACT inhaler Inhale 2 puffs into the lungs every 6 (six) hours as needed for wheezing or shortness of breath.   atorvastatin (LIPITOR) 40 MG tablet TAKE 1 TABLET BY MOUTH ONCE DAILY 6 IN THE EVENING   carvedilol (COREG) 3.125 MG tablet Take 1 tablet (3.125 mg total) by mouth 2 (two) times daily with a meal.   Cholecalciferol (VITAMIN D3) 1.25 MG (50000 UT) CAPS Take 1 capsule by mouth once a week.   dapagliflozin propanediol (FARXIGA) 10 MG TABS tablet Take 10 mg by mouth daily.   diphenhydrAMINE-APAP, sleep, (TYLENOL PM EXTRA STRENGTH PO) Take by mouth at bedtime as needed.   fenofibrate 54 MG tablet Take 54 mg by mouth daily.   fenofibrate 54 MG tablet Take 54 mg by mouth daily.   Fluticasone-Salmeterol (ADVAIR) 100-50 MCG/DOSE AEPB Inhale 1 puff into the lungs 2 (two) times daily as needed (shortness of breath).    gabapentin (NEURONTIN) 100 MG capsule Take 100 mg by mouth daily with breakfast.   insulin  aspart (NOVOLOG) 100 UNIT/ML FlexPen Inject 0-5 Units into the skin daily. Based on sliding scale   Insulin Glargine-yfgn 100 UNIT/ML SOPN Take 40 Units by mouth 2 (two) times daily.   INVOKANA 100 MG TABS tablet Take 100 mg by mouth daily.   lisinopril (ZESTRIL) 2.5 MG tablet Take 1 tablet by mouth once daily   meclizine (ANTIVERT) 25 MG tablet Take 25 mg by mouth at bedtime.   methocarbamol (ROBAXIN) 750 MG tablet SMARTSIG:1 Tablet(s) By Mouth Every 12 Hours   nitroGLYCERIN (NITROSTAT) 0.4 MG SL tablet DISSOLVE ONE TABLET UNDER THE TONGUE EVERY 5 MINUTES AS NEEDED FOR CHEST PAIN.  DO NOT EXCEED A TOTAL OF 3 DOSES IN 15 MINUTES NOW   oxycodone (ROXICODONE)  30 MG immediate release tablet 1 tablet as needed Orally every 8 hrs--do not fill until 07/10/2021 for 30 days   OXYCODONE ER PO Take 1 tablet by mouth every 8 (eight) hours as needed (PAIN).    promethazine (PHENERGAN) 12.5 MG tablet Take 12.5 mg by mouth every 8 (eight) hours as needed.   ticagrelor (BRILINTA) 60 MG TABS tablet Take 1 tablet (60 mg total) by mouth 2 (two) times daily.   tiotropium (SPIRIVA) 18 MCG inhalation capsule Place 18 mcg into inhaler and inhale daily as needed (shortness of breath).      Allergies:   Wellbutrin [bupropion]   Social History   Socioeconomic History   Marital status: Married    Spouse name: Not on file   Number of children: Not on file   Years of education: Not on file   Highest education level: Not on file  Occupational History   Not on file  Tobacco Use   Smoking status: Every Day    Packs/day: 2.00    Years: 41.00    Pack years: 82.00    Types: Cigarettes   Smokeless tobacco: Never  Vaping Use   Vaping Use: Never used  Substance and Sexual Activity   Alcohol use: Not Currently   Drug use: No   Sexual activity: Yes  Other Topics Concern   Not on file  Social History Narrative   ** Merged History Encounter **       Social Determinants of Health   Financial Resource Strain:  Not on file  Food Insecurity: Not on file  Transportation Needs: Not on file  Physical Activity: Not on file  Stress: Not on file  Social Connections: Not on file     Family History: The patient's She was adopted. Family history is unknown by patient.  ROS:   Please see the history of present illness.    All other systems reviewed and are negative.  EKGs/Labs/Other Studies Reviewed:    The following studies were reviewed today: Myoview Scan 03-21-2021: The left ventricular ejection fraction is hyperdynamic (>65%). Nuclear stress EF: 71%. There was no ST segment deviation noted during stress. Defect 1: There is a large defect of severe severity present in the basal inferior, mid inferior and apical inferior location. Findings consistent with prior myocardial infarction with peri-infarct ischemia. This is an intermediate risk study.   Abnormal, intermediate risk stress nuclear study with prior inferior infarct and mild peri-infarct ischemia.  Gated ejection fraction 71% with normal wall motion.  Echo 03-27-21: 1. Left ventricular ejection fraction, by estimation, is 55 to 60%. The  left ventricle has normal function. The left ventricle has no regional  wall motion abnormalities. There is moderate left ventricular hypertrophy.  Left ventricular diastolic  parameters are consistent with Grade I diastolic dysfunction (impaired  relaxation).   2. Right ventricular systolic function is normal. The right ventricular  size is normal. Tricuspid regurgitation signal is inadequate for assessing  PA pressure.   3. The mitral valve is normal in structure. Trivial mitral valve  regurgitation. No evidence of mitral stenosis.   4. The aortic valve is grossly normal. Aortic valve regurgitation is not  visualized. No aortic stenosis is present.   5. The inferior vena cava is normal in size with greater than 50%  respiratory variability, suggesting right atrial pressure of 3 mmHg.  EKG:  EKG  is ordered today.  The ekg ordered today demonstrates normal sinus rhythm 91 bpm, age-indeterminate inferior MI, no acute ST/T wave  changes.  Recent Labs: 03/01/2021: NT-Pro BNP 152 03/16/2021: Hemoglobin 16.8; Platelet Count 206  Recent Lipid Panel    Component Value Date/Time   CHOL 140 01/31/2016 0743   TRIG 283 (H) 01/31/2016 0743   HDL 31 (L) 01/31/2016 0743   CHOLHDL 4.5 01/31/2016 0743   VLDL 57 (H) 01/31/2016 0743   LDLCALC 52 01/31/2016 0743   LDLDIRECT 152.0 04/20/2015 1450     Risk Assessment/Calculations:          Physical Exam:    VS:  BP 124/70   Pulse 91   Ht 5\' 2"  (1.575 m)   Wt 198 lb (89.8 kg)   SpO2 98%   BMI 36.21 kg/m     Wt Readings from Last 3 Encounters:  08/09/21 198 lb (89.8 kg)  03/21/21 200 lb (90.7 kg)  03/16/21 198 lb 12.8 oz (90.2 kg)     GEN:  Well nourished, well developed in no acute distress HEENT: Normal NECK: No JVD; No carotid bruits LYMPHATICS: No lymphadenopathy CARDIAC: RRR, no murmurs, rubs, gallops RESPIRATORY:  Clear to auscultation without rales, wheezing or rhonchi  ABDOMEN: Soft, non-tender, non-distended MUSCULOSKELETAL:  No edema; No deformity  SKIN: Warm and dry NEUROLOGIC:  Alert and oriented x 3 PSYCHIATRIC:  Normal affect   ASSESSMENT:    1. Coronary artery disease involving native coronary artery of native heart with angina pectoris (HCC)   2. Essential hypertension   3. Tobacco abuse   4. Mixed hyperlipidemia   5. Polycythemia    PLAN:    In order of problems listed above:  Overall has a stable pattern.  Her last nuclear scan showed no change from the previous study where she had old inferolateral scar and mild peri-infarct ischemia.  I would continue with her current medical program which includes ticagrelor 60 mg twice daily, high intensity statin drug with atorvastatin 40 mg, and isosorbide.  She is on a low-dose of carvedilol as well.  I reviewed with her that if her anginal symptoms progress, she  should contact 03/18/21 and we would consider repeat catheter angiography. Blood pressure controlled on current medications Major issue, patient not ready to quit.  I counseled her today regarding the importance of reducing her cigarettes and quitting but I do not think this will happen. Treated with high intensity statin drug.  Continue current therapy. Patient has been evaluated by hematology.  Their notes are reviewed.           Medication Adjustments/Labs and Tests Ordered: Current medicines are reviewed at length with the patient today.  Concerns regarding medicines are outlined above.  Orders Placed This Encounter  Procedures   EKG 12-Lead    No orders of the defined types were placed in this encounter.   Patient Instructions  Medication Instructions:  Your physician recommends that you continue on your current medications as directed. Please refer to the Current Medication list given to you today.  *If you need a refill on your cardiac medications before your next appointment, please call your pharmacy*  Follow-Up: At [facility], you and your health needs are our priority.  As part of our continuing mission to provide you with exceptional heart care, we have created designated Provider Care Teams.  These Care Teams include your primary Cardiologist (physician) and Advanced Practice Providers (APPs -  Physician Assistants and Nurse Practitioners) who all work together to provide you with the care you need, when you need it.  Your next appointment:   6 month(s)  The format for your next appointment:   In Person  Provider: Tereso Newcomer, PA-C  Follow up with Dr. Excell Seltzer in 1 year   Signed, Tonny Bollman, MD  08/09/2021 3:52 PM    [facility]

## 2021-08-09 NOTE — Patient Instructions (Signed)
Medication Instructions:  Your physician recommends that you continue on your current medications as directed. Please refer to the Current Medication list given to you today.  *If you need a refill on your cardiac medications before your next appointment, please call your pharmacy*  Follow-Up: At [facility], you and your health needs are our priority.  As part of our continuing mission to provide you with exceptional heart care, we have created designated Provider Care Teams.  These Care Teams include your primary Cardiologist (physician) and Advanced Practice Providers (APPs -  Physician Assistants and Nurse Practitioners) who all work together to provide you with the care you need, when you need it.  Your next appointment:   6 month(s)  The format for your next appointment:   In Person  Provider: Tereso Newcomer, PA-C  Follow up with Dr. Excell Seltzer in 1 year

## 2021-10-12 ENCOUNTER — Other Ambulatory Visit: Payer: BC Managed Care – PPO

## 2021-10-12 ENCOUNTER — Ambulatory Visit: Payer: BC Managed Care – PPO | Admitting: Hematology and Oncology

## 2021-10-18 NOTE — Progress Notes (Incomplete)
Patient Care Team: Azell Der, MD as PCP - General (Family Medicine) Sherren Mocha, MD as PCP - Cardiology (Cardiology) Rexene Alberts, MD (Inactive) as Consulting Physician (Cardiothoracic Surgery) Flossie Buffy., MD as Referring Physician (Cardiology) Sherryl Barters, MD as Consulting Physician (Cardiology) Sharmon Revere as Physician Assistant (Cardiology)  DIAGNOSIS: No diagnosis found.  CHIEF COMPLIANT: Follow-up of erythrocytosis  INTERVAL HISTORY: Sara Hobbs is a 61 y.o. with above-mentioned history of erythrocytosis. She presents to the clinic today for follow-up.  ALLERGIES:  is allergic to wellbutrin [bupropion].  MEDICATIONS:  Current Outpatient Medications  Medication Sig Dispense Refill   albuterol (PROVENTIL HFA;VENTOLIN HFA) 108 (90 BASE) MCG/ACT inhaler Inhale 2 puffs into the lungs every 6 (six) hours as needed for wheezing or shortness of breath.     atorvastatin (LIPITOR) 40 MG tablet TAKE 1 TABLET BY MOUTH ONCE DAILY 6 IN THE EVENING 90 tablet 2   carvedilol (COREG) 3.125 MG tablet Take 1 tablet (3.125 mg total) by mouth 2 (two) times daily with a meal. 180 tablet 2   Cholecalciferol (VITAMIN D3) 1.25 MG (50000 UT) CAPS Take 1 capsule by mouth once a week.     dapagliflozin propanediol (FARXIGA) 10 MG TABS tablet Take 10 mg by mouth daily.     diphenhydrAMINE-APAP, sleep, (TYLENOL PM EXTRA STRENGTH PO) Take by mouth at bedtime as needed.     fenofibrate 54 MG tablet Take 54 mg by mouth daily.     fenofibrate 54 MG tablet Take 54 mg by mouth daily.     Fluticasone-Salmeterol (ADVAIR) 100-50 MCG/DOSE AEPB Inhale 1 puff into the lungs 2 (two) times daily as needed (shortness of breath).      gabapentin (NEURONTIN) 100 MG capsule Take 100 mg by mouth daily with breakfast.     insulin aspart (NOVOLOG) 100 UNIT/ML FlexPen Inject 0-5 Units into the skin daily. Based on sliding scale     Insulin Glargine-yfgn 100 UNIT/ML SOPN Take 40 Units by mouth  2 (two) times daily.     INVOKANA 100 MG TABS tablet Take 100 mg by mouth daily.     isosorbide mononitrate (IMDUR) 30 MG 24 hr tablet Take 1 tablet (30 mg total) by mouth daily. 90 tablet 3   lisinopril (ZESTRIL) 2.5 MG tablet Take 1 tablet by mouth once daily 90 tablet 1   meclizine (ANTIVERT) 25 MG tablet Take 25 mg by mouth at bedtime.     methocarbamol (ROBAXIN) 750 MG tablet SMARTSIG:1 Tablet(s) By Mouth Every 12 Hours     nitroGLYCERIN (NITROSTAT) 0.4 MG SL tablet DISSOLVE ONE TABLET UNDER THE TONGUE EVERY 5 MINUTES AS NEEDED FOR CHEST PAIN.  DO NOT EXCEED A TOTAL OF 3 DOSES IN 15 MINUTES NOW 25 tablet 4   ondansetron (ZOFRAN) 8 MG tablet Take 8 mg by mouth every 8 (eight) hours as needed. (Patient not taking: Reported on 08/09/2021)     oxycodone (ROXICODONE) 30 MG immediate release tablet 1 tablet as needed Orally every 8 hrs--do not fill until 07/10/2021 for 30 days     OXYCODONE ER PO Take 1 tablet by mouth every 8 (eight) hours as needed (PAIN).      promethazine (PHENERGAN) 12.5 MG tablet Take 12.5 mg by mouth every 8 (eight) hours as needed.     ticagrelor (BRILINTA) 60 MG TABS tablet Take 1 tablet (60 mg total) by mouth 2 (two) times daily. 180 tablet 2   tiotropium (SPIRIVA) 18 MCG inhalation capsule Place 18 mcg  into inhaler and inhale daily as needed (shortness of breath).      No current facility-administered medications for this visit.    PHYSICAL EXAMINATION: ECOG PERFORMANCE STATUS: {CHL ONC ECOG PS:[id number]}  There were no vitals filed for this visit. There were no vitals filed for this visit.  LABORATORY DATA:  I have reviewed the data as listed CMP Latest Ref Rng & Units 07/28/2016 06/19/2016 06/19/2016  Glucose 65 - 99 mg/dL 357(H) - 182(H)  BUN 6 - 20 mg/dL 12 - 6  Creatinine 0.44 - 1.00 mg/dL 0.81 0.68 0.72  Sodium 135 - 145 mmol/L 133(L) - 137  Potassium 3.5 - 5.1 mmol/L 4.3 - 3.6  Chloride 101 - 111 mmol/L 100(L) - 107  CO2 22 - 32 mmol/L 22 - 23   Calcium 8.9 - 10.3 mg/dL 9.5 - 8.9  Total Protein 6.5 - 8.1 g/dL 7.0 - -  Total Bilirubin 0.3 - 1.2 mg/dL 0.8 - -  Alkaline Phos 38 - 126 U/L 117 - -  AST 15 - 41 U/L 28 - -  ALT 14 - 54 U/L 49 - -    Lab Results  Component Value Date   WBC 10.4 03/16/2021   HGB 16.8 (H) 03/16/2021   HCT 48.5 (H) 03/16/2021   MCV 93.8 03/16/2021   PLT 206 03/16/2021   NEUTROABS 5.7 03/16/2021    ASSESSMENT & PLAN:  No problem-specific Assessment & Plan notes found for this encounter.    No orders of the defined types were placed in this encounter.  The patient has a good understanding of the overall plan. she agrees with it. she will call with any problems that may develop before the next visit here.  Total time spent: *** mins including face to face time and time spent for planning, charting and coordination of care  Rulon Eisenmenger, MD, MPH 10/18/2021  I, Thana Ates, am acting as scribe for Dr. Nicholas Lose.  {insert scribe attestation}

## 2021-10-19 ENCOUNTER — Inpatient Hospital Stay: Payer: BC Managed Care – PPO | Admitting: Hematology and Oncology

## 2021-10-19 ENCOUNTER — Inpatient Hospital Stay: Payer: BC Managed Care – PPO

## 2021-10-19 NOTE — Assessment & Plan Note (Signed)
Lab review: 05/14/2018: Hemoglobin 14.7: Hematocrit 44.4 01/24/21 showed RBC 5.26, HCT 50.1, Hg 17, PLT 193  I discussed with the patient extensively the differential diagnosis of polycythemia 1. Primary polycythemia due to clonal stem cell abnormality 2. Secondary polycythemia due to cause that include hypoxia, heart or lung problems, altitude, athletics, erythropoietin producing lesion/tumors etc(patient has COPD and heart disease)  Recommendation: 1. JAK-2 mutation testing to evaluate polycythemia vera: Negative on 03/22/2021.  Therefore that she does not have any myeloproliferative neoplasm. 2. Erythropoietin level: 7.2 (appropriately low)   Indications for phlebotomy severe symptoms which include strokelike symptoms, severe recurrent headaches, severe fatigue.  Patient does not have any clear-cut symptoms that would require phlebotomy at this time.  Return to clinic in 6 months with labs and follow-up

## 2021-12-10 ENCOUNTER — Other Ambulatory Visit: Payer: Self-pay | Admitting: Cardiovascular Disease

## 2022-01-06 ENCOUNTER — Other Ambulatory Visit: Payer: Self-pay | Admitting: Physician Assistant

## 2022-01-21 ENCOUNTER — Other Ambulatory Visit: Payer: Self-pay | Admitting: Cardiovascular Disease

## 2022-02-05 ENCOUNTER — Ambulatory Visit: Payer: BC Managed Care – PPO | Admitting: Physician Assistant

## 2022-03-07 ENCOUNTER — Ambulatory Visit (INDEPENDENT_AMBULATORY_CARE_PROVIDER_SITE_OTHER): Payer: BC Managed Care – PPO | Admitting: Physician Assistant

## 2022-03-07 ENCOUNTER — Encounter: Payer: Self-pay | Admitting: Physician Assistant

## 2022-03-07 VITALS — BP 130/70 | HR 86 | Ht 62.0 in | Wt 197.6 lb

## 2022-03-07 DIAGNOSIS — I10 Essential (primary) hypertension: Secondary | ICD-10-CM | POA: Diagnosis not present

## 2022-03-07 DIAGNOSIS — I25118 Atherosclerotic heart disease of native coronary artery with other forms of angina pectoris: Secondary | ICD-10-CM

## 2022-03-07 DIAGNOSIS — J439 Emphysema, unspecified: Secondary | ICD-10-CM

## 2022-03-07 DIAGNOSIS — E785 Hyperlipidemia, unspecified: Secondary | ICD-10-CM

## 2022-03-07 DIAGNOSIS — E118 Type 2 diabetes mellitus with unspecified complications: Secondary | ICD-10-CM

## 2022-03-07 DIAGNOSIS — I255 Ischemic cardiomyopathy: Secondary | ICD-10-CM

## 2022-03-07 DIAGNOSIS — I25119 Atherosclerotic heart disease of native coronary artery with unspecified angina pectoris: Secondary | ICD-10-CM | POA: Diagnosis not present

## 2022-03-07 MED ORDER — LISINOPRIL 2.5 MG PO TABS: 2.5000 mg | ORAL_TABLET | Freq: Every day | ORAL | 3 refills | Status: DC

## 2022-03-07 MED ORDER — ISOSORBIDE MONONITRATE ER 60 MG PO TB24: 60.0000 mg | ORAL_TABLET | Freq: Every day | ORAL | 3 refills | Status: DC

## 2022-03-07 MED ORDER — ATORVASTATIN CALCIUM 40 MG PO TABS: ORAL_TABLET | ORAL | 3 refills | Status: DC

## 2022-03-07 MED ORDER — TICAGRELOR 60 MG PO TABS: 60.0000 mg | ORAL_TABLET | Freq: Two times a day (BID) | ORAL | 3 refills | Status: DC

## 2022-03-07 MED ORDER — CARVEDILOL 3.125 MG PO TABS: 3.1250 mg | ORAL_TABLET | Freq: Two times a day (BID) | ORAL | 3 refills | Status: DC

## 2022-03-07 MED ORDER — NITROGLYCERIN 0.4 MG SL SUBL: SUBLINGUAL_TABLET | SUBLINGUAL | 4 refills | Status: DC

## 2022-03-07 NOTE — Progress Notes (Signed)
Cardiology Office Note:    Date:  03/07/2022   ID:  Sara Hobbs, DOB 1961/03/08, MRN [id number]  PCP:  Azell Der, MD  [facility] Cardiologist:  Sherren Mocha, MD Cardiology APP:  Sharmon Revere    Referring MD: Azell Der, MD   Chief Complaint:  F/u for CAD    Patient Profile: Coronary artery disease S/p CABG S/p PEA arrest 3/17 >> s/p DES to LCx Myoview 8/17: inf scar with mod peri-infarct ischemia; EF 64 Cath 10/17: L-LAD patent, S-PDA chronic 100, LCx stent patent >> Med Rx Myoview 7/22: inf infarct w mild peri-infarct ischemia, EF 71 >> Med Rx  Ischemic CM Echocardiogram 3/17: EF 45-50, mild MR, sept and post-lat HK Echocardiogram 7/22: EF 55-60, Gr 1 DD, trivial MR COPD Diabetes mellitus Peripheral neuropathy Hypertension Hyperlipidemia Tobacco use GERD Polycythemia   Prior CV Studies: GATED SPECT MYO PERF W/LEXISCAN STRESS 1D 03/21/2021 Abnormal, intermediate risk stress nuclear study with prior inferior infarct and mild peri-infarct ischemia.  Gated ejection fraction 71% with normal wall motion.   ECHO COMPLETE WITH IMAGING ENHANCING AGENT 03/27/2021 EF 55-60, no RWMA, mod LVH, Gr 1 DD, normal RVSF, trivial MR   Carotid US 08/09/2020 Bilateral ICA 1-39   Cardiac catheterization 06/19/16  LM normal LAD proximal 80; D1 90; first septal branch ostial 100 LCx proximal stent patent; OM2 35 RCA mid 100 SVG-RPDA 100 LIMA-distal LAD patent EF 45-50 Medical therapy    History of Present Illness:   Sara Hobbs is a 61 y.o. female with the above problem list.  She was last seen by Dr. Burt Knack in Dec 2022.  At that visit, her anginal pattern was stable and med Rx was continued.  She returns for f/u.  She is here with her daughter.  She notes that her pain specialist is changing her medications.  She is having a lot more pain now.  When she has worsening pain, she has chest pain.  She takes NTG sometimes with relief.  Sometimes  she does not have relief.  There are no assoc symptoms.  She is not sure if it is like her prior angina.  She has chronic dyspnea on exertion due to Chronic Obstructive Pulmonary Disease.  She continues to smoke.  She has not had syncope.  She sleeps on 2 pillows.  She has mild pedal edema without change.         Past Medical History:  Diagnosis Date   Angina pectoris ([place])    Anxiety    Arthritis    "hands, neck, back, knees, hips, ankles" (11/22/2014)   Arthritis    Asthma    CAD (coronary artery disease)    a. 09/2014: LIMA to LAD, SVG to PDA, EVH via right thigh b. Cardiac Arrest 11/2015: patent LIMA-LAD, CTO if SVG-PDA, 99% stenosis of Prox-Mid Cx w/ DES placed // Myoview 7/22 EF 71, inf infarct with peri-infarct ischemia; intermediate risk   CAD, multiple vessel    Cardiac arrest ([place]) 11/25/2015   Chest tightness    CHF (congestive heart failure) (HCC)    Echocardiogram 7/22: EF 55-60, no RWMA, mod LVH, Gr 1 DD, normal RVSF, trivial MR   Chronic lower back pain    Chronic shoulder pain    "right; I've got bad rotator cuff"   COPD (chronic obstructive pulmonary disease) ([place])    Coronary artery disease    Depression    Diabetes mellitus with complication (HCC)    Diabetes mellitus without complication (  [place])    Essential hypertension 05/20/2018   GERD (gastroesophageal reflux disease)    Headache    "monthly" (11/22/2014)   History of blood transfusion    "when I had my 1st youngun; when I had MI"   History of hiatal hernia    History of stomach ulcers    Hyperlipidemia    Hypothyroidism    PMH:   IDDM (insulin dependent diabetes mellitus)    Incidental lung nodule, > 69mm and < 28mm 11/22/2014   Small non-calcified nodules left lung   Myocardial infarction ([place]) 11/2013   PCI w/ drug eluting stent LCx   Neuropathy    Neuropathy due to secondary diabetes ([place])    Wears glasses    Current Medications: Current Meds  Medication Sig   albuterol (PROVENTIL HFA;VENTOLIN  HFA) 108 (90 BASE) MCG/ACT inhaler Inhale 2 puffs into the lungs every 6 (six) hours as needed for wheezing or shortness of breath.   Cholecalciferol (VITAMIN D3) 1.25 MG (50000 UT) CAPS Take 1 capsule by mouth daily.   dapagliflozin propanediol (FARXIGA) 10 MG TABS tablet Take 10 mg by mouth daily.   diphenhydrAMINE-APAP, sleep, (TYLENOL PM EXTRA STRENGTH PO) Take by mouth at bedtime as needed.   fenofibrate 54 MG tablet Take 54 mg by mouth daily.   Fluticasone-Salmeterol (ADVAIR) 100-50 MCG/DOSE AEPB Inhale 1 puff into the lungs 2 (two) times daily as needed (shortness of breath).    gabapentin (NEURONTIN) 100 MG capsule Take 100 mg by mouth daily with breakfast.   insulin aspart (NOVOLOG) 100 UNIT/ML FlexPen Inject 0-5 Units into the skin daily. Based on sliding scale   Insulin Glargine-yfgn 100 UNIT/ML SOPN Take 40 Units by mouth 2 (two) times daily.   isosorbide mononitrate (IMDUR) 60 MG 24 hr tablet Take 1 tablet (60 mg total) by mouth daily.   meclizine (ANTIVERT) 25 MG tablet Take 25 mg by mouth at bedtime.   methocarbamol (ROBAXIN) 750 MG tablet SMARTSIG:1 Tablet(s) By Mouth Every 12 Hours   ondansetron (ZOFRAN) 8 MG tablet Take 8 mg by mouth every 8 (eight) hours as needed.   oxycodone (ROXICODONE) 30 MG immediate release tablet Take 15 mg by mouth 4 (four) times daily.   OXYCODONE ER PO Take 1 tablet by mouth every 8 (eight) hours as needed (PAIN).    promethazine (PHENERGAN) 12.5 MG tablet Take 12.5 mg by mouth every 8 (eight) hours as needed.   tiotropium (SPIRIVA) 18 MCG inhalation capsule Place 18 mcg into inhaler and inhale daily as needed (shortness of breath).    [DISCONTINUED] atorvastatin (LIPITOR) 40 MG tablet TAKE 1 TABLET BY MOUTH ONCE DAILY 6 IN THE EVENING   [DISCONTINUED] carvedilol (COREG) 3.125 MG tablet Take 1 tablet (3.125 mg total) by mouth 2 (two) times daily with a meal. Please keep upcoming appointment for future refills.  Thank you.   [DISCONTINUED] INVOKANA 100  MG TABS tablet Take 100 mg by mouth daily.   [DISCONTINUED] isosorbide mononitrate (IMDUR) 30 MG 24 hr tablet Take 1 tablet (30 mg total) by mouth daily.   [DISCONTINUED] lisinopril (ZESTRIL) 2.5 MG tablet Take 1 tablet by mouth once daily   [DISCONTINUED] nitroGLYCERIN (NITROSTAT) 0.4 MG SL tablet DISSOLVE ONE TABLET UNDER THE TONGUE EVERY 5 MINUTES AS NEEDED FOR CHEST PAIN.  DO NOT EXCEED A TOTAL OF 3 DOSES IN 15 MINUTES NOW   [DISCONTINUED] ticagrelor (BRILINTA) 60 MG TABS tablet Take 1 tablet (60 mg total) by mouth 2 (two) times daily. Please keep upcoming appointment for  future refills. Thank you.    Allergies:   Wellbutrin [bupropion]   Social History   Tobacco Use   Smoking status: Every Day    Packs/day: 2.00    Years: 41.00    Total pack years: 82.00    Types: Cigarettes   Smokeless tobacco: Never  Vaping Use   Vaping Use: Never used  Substance Use Topics   Alcohol use: Not Currently   Drug use: No    Family Hx: The patient's She was adopted. Family history is unknown by patient.  Review of Systems  Gastrointestinal:  Negative for hematochezia.  Genitourinary:  Negative for hematuria.     EKGs/Labs/Other Test Reviewed:    EKG:  EKG is  ordered today.  The ekg ordered today demonstrates NSR, HR 77, inf Q waves, ant Q waves, non-specific ST-TW changes, no change from prior tracing, QTc 454 ms  Recent Labs: 03/16/2021: Hemoglobin 16.8; Platelet Count 206   Recent Lipid Panel No results for input(s): "CHOL", "TRIG", "HDL", "VLDL", "LDLCALC", "LDLDIRECT" in the last 8760 hours.   Risk Assessment/Calculations/Metrics:              Physical Exam:    VS:  BP 130/70   Pulse 86   Ht _0  (1.575 m)   Wt 197 lb 9.6 oz (89.6 kg)   SpO2 96%   BMI 36.14 kg/m     Wt Readings from Last 3 Encounters:  03/07/22 197 lb 9.6 oz (89.6 kg)  08/09/21 198 lb (89.8 kg)  03/21/21 200 lb (90.7 kg)    Constitutional:      Appearance: Healthy appearance. Not in distress.   Neck:     Thyroid: No thyromegaly.     Vascular: JVD normal.  Pulmonary:     Effort: Pulmonary effort is normal.     Breath sounds: Bilateral Wheezing (inspiratory wheezes bilat) present. No rales.  Cardiovascular:     Normal rate. Regular rhythm. Normal S1. Normal S2.      Murmurs: There is no murmur.  Edema:    Peripheral edema absent.  Abdominal:     Palpations: Abdomen is soft.  Skin:    General: Skin is warm and dry.  Neurological:     Mental Status: Alert and oriented to person, place and time.          ASSESSMENT & PLAN:   Coronary artery disease involving native coronary artery of native heart with angina pectoris (HCC) Hx of CABG.  She had PEA arrest in 2017 and ultimately underwent DES to LCx. Her last cath in 2017 showed a patent L-LAD, occluded S-PDA and patent LCx stent.  Her Myoview in 2022 demonstrated inf infarct with peri-infarct ischemia.  She has had ongoing chest pain and when last seen by Dr. Burt Knack, he recommended considering cardiac catheterization if her symptoms should progress. She is has chronic pain and her medications have been changed recently.  She is having increasing angina in this setting.  Her EKG does not show any changes.  We discussed proceeding with cardiac catheterization.  However, she would like to avoid this if at all possible.  Since her symptoms seem to be in response to poor pain control, we discussed escalating her antianginal Rx with close follow up.  She is agreeable to this.  She knows to go to the ED if her symptoms get much worse or follow up here sooner if her symptoms do not improve with medication changes. Continue atorvastatin 40 mg daily, carvedilol 3.125 mg twice  daily, ticagrelor 60 mg twice daily, nitroglycerin as needed Increase isosorbide mononitrate to 60 mg daily Follow-up in 4 weeks with Dr. Burt Knack or me  Hyperlipidemia LDL goal <70 Continue atorvastatin 40 mg daily, fenofibrate 54 mg daily.  Obtain CMET, lipids  today.  Essential hypertension Blood pressure is controlled.  Continue lisinopril 2.5 mg daily, carvedilol 3.125 mg twice daily.  Increase isosorbide mononitrate to 60 mg daily as noted.  Diabetes mellitus with complication [facility]) She is followed by endocrinology (Dr. Chalmers Cater).  COPD (chronic obstructive pulmonary disease) ([place]) Unfortunately, she continues to smoke.  She is not ready to quit.           Dispo:  Return in about 4 weeks (around 04/04/2022) for Close Follow Up, w/ Dr. Burt Knack, or Richardson Dopp, PA-C.   Medication Adjustments/Labs and Tests Ordered: Current medicines are reviewed at length with the patient today.  Concerns regarding medicines are outlined above.  Tests Ordered: Orders Placed This Encounter  Procedures   Comp Met (CMET)   Lipid panel   EKG 12-Lead   Medication Changes: Meds ordered this encounter  Medications   isosorbide mononitrate (IMDUR) 60 MG 24 hr tablet    Sig: Take 1 tablet (60 mg total) by mouth daily.    Dispense:  90 tablet    Refill:  3   lisinopril (ZESTRIL) 2.5 MG tablet    Sig: Take 1 tablet (2.5 mg total) by mouth daily.    Dispense:  90 tablet    Refill:  3   atorvastatin (LIPITOR) 40 MG tablet    Sig: TAKE 1 TABLET BY MOUTH ONCE DAILY 6 IN THE EVENING    Dispense:  90 tablet    Refill:  3   carvedilol (COREG) 3.125 MG tablet    Sig: Take 1 tablet (3.125 mg total) by mouth 2 (two) times daily with a meal.    Dispense:  180 tablet    Refill:  3   ticagrelor (BRILINTA) 60 MG TABS tablet    Sig: Take 1 tablet (60 mg total) by mouth 2 (two) times daily.    Dispense:  180 tablet    Refill:  3   nitroGLYCERIN (NITROSTAT) 0.4 MG SL tablet    Sig: DISSOLVE ONE TABLET UNDER THE TONGUE EVERY 5 MINUTES AS NEEDED FOR CHEST PAIN.  DO NOT EXCEED A TOTAL OF 3 DOSES IN 15 MINUTES NOW    Dispense:  25 tablet    Refill:  4   Signed, Richardson Dopp, PA-C  03/07/2022 4:21 PM    [address], [place], [place]  [address] Phone:  [phone]; Fax: [phone]

## 2022-03-07 NOTE — Assessment & Plan Note (Signed)
Continue atorvastatin 40 mg daily, fenofibrate 54 mg daily.  Obtain CMET, lipids today.

## 2022-03-07 NOTE — Assessment & Plan Note (Signed)
Hx of CABG.  She had PEA arrest in 2017 and ultimately underwent DES to LCx. Her last cath in 2017 showed a patent L-LAD, occluded S-PDA and patent LCx stent.  Her Myoview in 2022 demonstrated inf infarct with peri-infarct ischemia.  She has had ongoing chest pain and when last seen by Dr. Excell Seltzer, he recommended considering cardiac catheterization if her symptoms should progress. She is has chronic pain and her medications have been changed recently.  She is having increasing angina in this setting.  Her EKG does not show any changes.  We discussed proceeding with cardiac catheterization.  However, she would like to avoid this if at all possible.  Since her symptoms seem to be in response to poor pain control, we discussed escalating her antianginal Rx with close follow up.  She is agreeable to this.  She knows to go to the ED if her symptoms get much worse or follow up here sooner if her symptoms do not improve with medication changes.  Continue atorvastatin 40 mg daily, carvedilol 3.125 mg twice daily, ticagrelor 60 mg twice daily, nitroglycerin as needed  Increase isosorbide mononitrate to 60 mg daily  Follow-up in 4 weeks with Dr. Excell Seltzer or me

## 2022-03-07 NOTE — Assessment & Plan Note (Signed)
Blood pressure is controlled.  Continue lisinopril 2.5 mg daily, carvedilol 3.125 mg twice daily.  Increase isosorbide mononitrate to 60 mg daily as noted.

## 2022-03-07 NOTE — Patient Instructions (Signed)
Medication Instructions:  Your physician has recommended you make the following change in your medication:   INCREASE the Imdur to 60 mg taking 1 daily.  You may take 2 of the 30 mg tablets at 1 time to use them up   *If you need a refill on your cardiac medications before your next appointment, please call your pharmacy*   Lab Work: TODAY:  CMET & LIPID  If you have labs (blood work) drawn today and your tests are completely normal, you will receive your results only by: MyChart Message (if you have MyChart) OR A paper copy in the mail If you have any lab test that is abnormal or we need to change your treatment, we will call you to review the results.   Testing/Procedures: None ordered   Follow-Up: At [facility], you and your health needs are our priority.  As part of our continuing mission to provide you with exceptional heart care, we have created designated Provider Care Teams.  These Care Teams include your primary Cardiologist (physician) and Advanced Practice Providers (APPs -  Physician Assistants and Nurse Practitioners) who all work together to provide you with the care you need, when you need it.  We recommend signing up for the patient portal called "MyChart".  Sign up information is provided on this After Visit Summary.  MyChart is used to connect with patients for Virtual Visits (Telemedicine).  Patients are able to view lab/test results, encounter notes, upcoming appointments, etc.  Non-urgent messages can be sent to your provider as well.   To learn more about what you can do with MyChart, go to [email or web address].    Your next appointment:   04/13/22 arrive at 10:40  The format for your next appointment:   In Person  Provider:   Tonny Bollman, MD  or Tereso Newcomer, PA-C         Other Instructions   Important Information About Sugar

## 2022-03-07 NOTE — Assessment & Plan Note (Addendum)
She is followed by endocrinology (Dr. Talmage Nap).

## 2022-03-07 NOTE — Assessment & Plan Note (Signed)
Unfortunately, she continues to smoke.  She is not ready to quit.

## 2022-03-08 LAB — COMPREHENSIVE METABOLIC PANEL
ALT: 18 IU/L (ref 0–32)
AST: 16 IU/L (ref 0–40)
Albumin/Globulin Ratio: 2.4 — ABNORMAL HIGH (ref 1.2–2.2)
Albumin: 4.4 g/dL (ref 3.8–4.9)
Alkaline Phosphatase: 73 IU/L (ref 44–121)
BUN/Creatinine Ratio: 7 — ABNORMAL LOW (ref 12–28)
BUN: 7 mg/dL — ABNORMAL LOW (ref 8–27)
Bilirubin Total: 0.3 mg/dL (ref 0.0–1.2)
CO2: 22 mmol/L (ref 20–29)
Calcium: 9.5 mg/dL (ref 8.7–10.3)
Chloride: 104 mmol/L (ref 96–106)
Creatinine, Ser: 0.94 mg/dL (ref 0.57–1.00)
Globulin, Total: 1.8 g/dL (ref 1.5–4.5)
Glucose: 88 mg/dL (ref 70–99)
Potassium: 4.2 mmol/L (ref 3.5–5.2)
Sodium: 140 mmol/L (ref 134–144)
Total Protein: 6.2 g/dL (ref 6.0–8.5)
eGFR: 69 mL/min/{1.73_m2} (ref >59)

## 2022-03-08 LAB — LIPID PANEL
Chol/HDL Ratio: 3.3 ratio (ref 0.0–4.4)
Cholesterol, Total: 144 mg/dL (ref 100–199)
HDL: 44 mg/dL (ref >39)
LDL Chol Calc (NIH): 73 mg/dL (ref 0–99)
Triglycerides: 157 mg/dL — ABNORMAL HIGH (ref 0–149)
VLDL Cholesterol Cal: 27 mg/dL (ref 5–40)

## 2022-03-13 ENCOUNTER — Telehealth: Payer: Self-pay | Admitting: *Deleted

## 2022-03-13 DIAGNOSIS — Z79899 Other long term (current) drug therapy: Secondary | ICD-10-CM

## 2022-03-13 MED ORDER — ATORVASTATIN CALCIUM 80 MG PO TABS: 80.0000 mg | ORAL_TABLET | Freq: Every day | ORAL | 3 refills | Status: DC

## 2022-03-13 NOTE — Telephone Encounter (Signed)
-----   Message from Beatrice Lecher, [place] sent at 03/08/2022  1:00 PM EDT ----- Creatinine, K+, LFTs normal.  Triglycerides mildly elevated. LDL above goal.  PLAN:  - Increase Lipitor to 80 mg once daily - Lipids, LFTs in 3 mos.  Tereso Newcomer, PA-C    03/08/2022 12:57 PM

## 2022-04-12 NOTE — Progress Notes (Deleted)
Cardiology Office Note:    Date:  04/12/2022   ID:  Zipporah Finamore, DOB 19-Aug-1961, MRN [id number]  PCP:  Alferd Apa, MD  [facility] Providers Cardiologist:  Tonny Bollman, MD Cardiology APP:  Beatrice Lecher, PA-C { Click to update primary MD,subspecialty MD or APP then REFRESH:1}  *** Referring MD: Alferd Apa, MD   Chief Complaint:  No chief complaint on file. {Click here for Visit Info    :1}   Patient Profile: Coronary artery disease S/p CABG S/p PEA arrest 3/17 >> s/p DES to LCx Myoview 8/17: inf scar with mod peri-infarct ischemia; EF 49 Cath 10/17: L-LAD patent, S-PDA chronic 100, LCx stent patent >> Med Rx Myoview 7/22: inf infarct w mild peri-infarct ischemia, EF 71 >> Med Rx  Ischemic CM Echocardiogram 3/17: EF 45-50, mild MR, sept and post-lat HK Echocardiogram 7/22: EF 55-60, Gr 1 DD, trivial MR COPD Diabetes mellitus Peripheral neuropathy Hypertension Hyperlipidemia Tobacco use GERD Polycythemia  Prior CV Studies: {Select studies to display:26339}  GATED SPECT MYO PERF W/LEXISCAN STRESS 1D 03/21/2021 Abnormal, intermediate risk stress nuclear study with prior inferior infarct and mild peri-infarct ischemia.  Gated ejection fraction 71% with normal wall motion.   ECHO COMPLETE WITH IMAGING ENHANCING AGENT 03/27/2021 EF 55-60, no RWMA, mod LVH, Gr 1 DD, normal RVSF, trivial MR   Carotid US 08/09/2020 Bilateral ICA 1-39   Cardiac catheterization 06/19/16  LM normal LAD proximal 80; D1 90; first septal branch ostial 100 LCx proximal stent patent; OM2 35 RCA mid 100 SVG-RPDA 100 LIMA-distal LAD patent EF 45-50 Medical therapy  History of Present Illness:   Sara Hobbs is a 62 y.o. female with the above problem list.  She was last seen in July 2023. She was having escalating anginal symptoms. We discussed proceeding with cardiac catheterization, but she wanted to avoid this. Her pain medications were being adjusted for chronic pain  and she felt it was more related to this than anything else. I adjusted her nitrates and had her return today for reevaluation. ***        Past Medical History:  Diagnosis Date   Angina pectoris (HCC)    Anxiety    Arthritis    "hands, neck, back, knees, hips, ankles" (11/22/2014)   Arthritis    Asthma    CAD (coronary artery disease)    a. 09/2014: LIMA to LAD, SVG to PDA, EVH via right thigh b. Cardiac Arrest 11/2015: patent LIMA-LAD, CTO if SVG-PDA, 99% stenosis of Prox-Mid Cx w/ DES placed // Myoview 7/22 EF 71, inf infarct with peri-infarct ischemia; intermediate risk   CAD, multiple vessel    Cardiac arrest (HCC) 11/25/2015   Chest tightness    CHF (congestive heart failure) (HCC)    Echocardiogram 7/22: EF 55-60, no RWMA, mod LVH, Gr 1 DD, normal RVSF, trivial MR   Chronic lower back pain    Chronic shoulder pain    "right; I've got bad rotator cuff"   COPD (chronic obstructive pulmonary disease) (HCC)    Coronary artery disease    Depression    Diabetes mellitus with complication (HCC)    Diabetes mellitus without complication (HCC)    Essential hypertension 05/20/2018   GERD (gastroesophageal reflux disease)    Headache    "monthly" (11/22/2014)   History of blood transfusion    "when I had my 1st youngun; when I had MI"   History of hiatal hernia    History of stomach ulcers  Hyperlipidemia    Hypothyroidism    PMH:   IDDM (insulin dependent diabetes mellitus)    Incidental lung nodule, > 33mm and < 55mm 11/22/2014   Small non-calcified nodules left lung   Myocardial infarction [facility]) 11/2013   PCI w/ drug eluting stent LCx   Neuropathy    Neuropathy due to secondary diabetes [facility])    Wears glasses    Current Medications: No outpatient medications have been marked as taking for the 04/13/22 encounter (Appointment) with Tereso Newcomer T, PA-C.    Allergies:   Wellbutrin [bupropion]   Social History   Tobacco Use   Smoking status: Every Day    Packs/day: 2.00     Years: 41.00    Total pack years: 82.00    Types: Cigarettes   Smokeless tobacco: Never  Vaping Use   Vaping Use: Never used  Substance Use Topics   Alcohol use: Not Currently   Drug use: No    Family Hx: The patient's She was adopted. Family history is unknown by patient.  ROS   EKGs/Labs/Other Test Reviewed:    EKG:  EKG is *** ordered today.  The ekg ordered today demonstrates ***  Recent Labs: 03/07/2022: ALT 18; BUN 7; Creatinine, Ser 0.94; Potassium 4.2; Sodium 140   Recent Lipid Panel Recent Labs    03/07/22 1611  CHOL 144  TRIG 157*  HDL 44  LDLCALC 73     Risk Assessment/Calculations/Metrics:   {Does this patient have ATRIAL FIBRILLATION?:[phone]}     No BP recorded.  {Refresh Note OR Click here to enter BP  :1}***    Physical Exam:    VS:  There were no vitals taken for this visit.    Wt Readings from Last 3 Encounters:  03/07/22 197 lb 9.6 oz (89.6 kg)  08/09/21 198 lb (89.8 kg)  03/21/21 200 lb (90.7 kg)    Physical Exam ***     ASSESSMENT & PLAN:   No problem-specific Assessment & Plan notes found for this encounter.        {Are you ordering a CV Procedure (e.g. stress test, cath, DCCV, TEE, etc)?   Press F2        :[id number]}   Dispo:  No follow-ups on file.   Medication Adjustments/Labs and Tests Ordered: Current medicines are reviewed at length with the patient today.  Concerns regarding medicines are outlined above.  Tests Ordered: No orders of the defined types were placed in this encounter.  Medication Changes: No orders of the defined types were placed in this encounter.  Signed, Tereso Newcomer, PA-C  04/12/2022 9:49 AM    [facility] [address] [place], [place], [place]  [address] Phone: [phone]; Fax: [phone]

## 2022-04-13 ENCOUNTER — Ambulatory Visit: Payer: BC Managed Care – PPO | Admitting: Physician Assistant

## 2022-04-13 DIAGNOSIS — I10 Essential (primary) hypertension: Secondary | ICD-10-CM

## 2022-04-13 DIAGNOSIS — I25119 Atherosclerotic heart disease of native coronary artery with unspecified angina pectoris: Secondary | ICD-10-CM

## 2022-04-13 DIAGNOSIS — E785 Hyperlipidemia, unspecified: Secondary | ICD-10-CM

## 2022-06-15 ENCOUNTER — Ambulatory Visit: Payer: BC Managed Care – PPO | Attending: Physician Assistant

## 2022-06-15 DIAGNOSIS — Z79899 Other long term (current) drug therapy: Secondary | ICD-10-CM

## 2022-06-15 LAB — LIPID PANEL
Chol/HDL Ratio: 2.5 ratio (ref 0.0–4.4)
Cholesterol, Total: 100 mg/dL (ref 100–199)
HDL: 40 mg/dL (ref >39)
LDL Chol Calc (NIH): 37 mg/dL (ref 0–99)
Triglycerides: 128 mg/dL (ref 0–149)
VLDL Cholesterol Cal: 23 mg/dL (ref 5–40)

## 2022-06-15 LAB — HEPATIC FUNCTION PANEL
ALT: 15 IU/L (ref 0–32)
AST: 12 IU/L (ref 0–40)
Albumin: 4.1 g/dL (ref 3.8–4.9)
Alkaline Phosphatase: 82 IU/L (ref 44–121)
Bilirubin Total: <0.2 mg/dL (ref 0.0–1.2)
Bilirubin, Direct: <0.1 mg/dL (ref 0.00–0.40)
Total Protein: 6.1 g/dL (ref 6.0–8.5)

## 2022-11-09 DIAGNOSIS — J441 Chronic obstructive pulmonary disease with (acute) exacerbation: Secondary | ICD-10-CM | POA: Diagnosis not present

## 2022-11-28 ENCOUNTER — Telehealth: Payer: Self-pay

## 2022-11-28 NOTE — Telephone Encounter (Signed)
**Note De-Identified Sara Hobbs Obfuscation** Brlinta PA started through covermymeds. Key: [id number]

## 2022-11-29 NOTE — Telephone Encounter (Addendum)
**Note De-Identified Allenmichael Mcpartlin Obfuscation** Brilinta PA denial letter received from [employer] [employer] Re: Sara Hobbs DOB: 1961/01/09 Notice of adverse decision [employer], the utilization review entity for [facility], received a request for coverage of Brilinta 60mg  Tablet for you. We've denied the request for the following reason(s): The member is unable to take the required number of formulary alternatives for the given diagnosis due to a trial and inadequate treatment response or contraindication.  Formulary alternative(s) are clopidogrel, prasugrel. Per the pts med list history, she has has never taken Clopidogrel or Prasugrel.  Forwarding to Dr Burt Knack and his nurse for advisement to the pt.

## 2022-12-04 NOTE — Telephone Encounter (Signed)
I'm ok with switching her from ticagrelor to clopidogrel 75 mg daily. No need for loading dose with switch ok to start clopidogrel 75 mg daily the day after her last dose of ticagrelor. Thank you

## 2022-12-06 MED ORDER — CLOPIDOGREL BISULFATE 75 MG PO TABS: 75.0000 mg | ORAL_TABLET | Freq: Every day | ORAL | 3 refills | Status: DC

## 2022-12-06 NOTE — Telephone Encounter (Signed)
Called and spoke directly with patient who verbalized understanding of switching her Brilinta to Plavix 75mg  daily. Medication sent to pharmacy on file.

## 2022-12-07 ENCOUNTER — Telehealth: Payer: Self-pay | Admitting: Cardiovascular Disease

## 2022-12-07 NOTE — Telephone Encounter (Signed)
[facility] [place] who is going to contact [facility] and transfer the Plavix prescription there so patient can pick up.

## 2022-12-07 NOTE — Telephone Encounter (Signed)
Pt c/o medication issue:  1. Name of Medication:   clopidogrel (PLAVIX) 75 MG tablet   2. How are you currently taking this medication (dosage and times per day)?  Not taking yet  3. Are you having a reaction (difficulty breathing--STAT)?   4. What is your medication issue?   Daughter stated patient has [facility] ([phone] [place], [place] - [address]) as requested by her insurance company and will need prescription sent to this pharmacy.

## 2023-03-19 DIAGNOSIS — M47896 Other spondylosis, lumbar region: Secondary | ICD-10-CM | POA: Diagnosis not present

## 2023-03-19 DIAGNOSIS — M791 Myalgia, unspecified site: Secondary | ICD-10-CM | POA: Diagnosis not present

## 2023-03-19 DIAGNOSIS — M47816 Spondylosis without myelopathy or radiculopathy, lumbar region: Secondary | ICD-10-CM | POA: Diagnosis not present

## 2023-03-25 DIAGNOSIS — M461 Sacroiliitis, not elsewhere classified: Secondary | ICD-10-CM | POA: Diagnosis not present

## 2023-03-25 DIAGNOSIS — M5136 Other intervertebral disc degeneration, lumbar region: Secondary | ICD-10-CM | POA: Diagnosis not present

## 2023-03-25 DIAGNOSIS — Z79891 Long term (current) use of opiate analgesic: Secondary | ICD-10-CM | POA: Diagnosis not present

## 2023-03-25 DIAGNOSIS — M47816 Spondylosis without myelopathy or radiculopathy, lumbar region: Secondary | ICD-10-CM | POA: Diagnosis not present

## 2023-03-25 DIAGNOSIS — G894 Chronic pain syndrome: Secondary | ICD-10-CM | POA: Diagnosis not present

## 2023-03-28 ENCOUNTER — Encounter: Payer: Self-pay | Admitting: Cardiovascular Disease

## 2023-03-28 ENCOUNTER — Ambulatory Visit: Payer: 59 | Attending: Cardiovascular Disease | Admitting: Cardiovascular Disease

## 2023-03-28 VITALS — BP 100/62 | HR 87 | Ht 62.0 in | Wt 185.0 lb

## 2023-03-28 DIAGNOSIS — E782 Mixed hyperlipidemia: Secondary | ICD-10-CM

## 2023-03-28 DIAGNOSIS — Z79899 Other long term (current) drug therapy: Secondary | ICD-10-CM | POA: Diagnosis not present

## 2023-03-28 DIAGNOSIS — I25119 Atherosclerotic heart disease of native coronary artery with unspecified angina pectoris: Secondary | ICD-10-CM

## 2023-03-28 DIAGNOSIS — Z720 Tobacco use: Secondary | ICD-10-CM

## 2023-03-28 NOTE — Progress Notes (Signed)
Cardiology Office Note:    Date:  03/28/2023   ID:  Sara Hobbs, DOB 1961/01/08, MRN [id number]  PCP:  Alferd Apa, MD   [facility] Providers Cardiologist:  Tonny Bollman, MD Cardiology APP:  Kennon Rounds     Referring MD: Alferd Apa, MD   Chief Complaint  Patient presents with   Coronary Artery Disease    History of Present Illness:    Sara Hobbs is a 62 y.o. female with a hx of:  Coronary artery disease S/p CABG S/p PEA arrest 3/17 >> s/p DES to LCx Myoview 8/17: inf scar with mod peri-infarct ischemia; EF 49 Cath 10/17: L-LAD patent, S-PDA chronic 100, LCx stent patent >> Med Rx Myoview 7/22: inf infarct w mild peri-infarct ischemia, EF 71 >> Med Rx  Ischemic CM Echocardiogram 3/17: EF 45-50, mild MR, sept and post-lat HK Echocardiogram 7/22: EF 55-60, Gr 1 DD, trivial MR COPD Diabetes mellitus Peripheral neuropathy Hypertension Hyperlipidemia Tobacco use GERD Polycythemia   The patient is here with her daughter today.  The patient is in a wheelchair.  She is pretty limited with leg problems and chronic pain.  She has been experiencing some substernal chest discomfort.  She states that sometimes it is a dull pain and other times it is a pressure-like sensation.  Her symptoms can improve with nitroglycerin, but sometimes it does not make any impact at all.  She is compliant with her medications.  However, she continues to smoke 2 packs/day.  She has pain in her neck, shoulders, back, and legs.  She is on chronic narcotics for pain management.  Past Medical History:  Diagnosis Date   Angina pectoris (HCC)    Anxiety    Arthritis    "hands, neck, back, knees, hips, ankles" (11/22/2014)   Arthritis    Asthma    CAD (coronary artery disease)    a. 09/2014: LIMA to LAD, SVG to PDA, EVH via right thigh b. Cardiac Arrest 11/2015: patent LIMA-LAD, CTO if SVG-PDA, 99% stenosis of Prox-Mid Cx w/ DES placed // Myoview 7/22 EF 71, inf infarct  with peri-infarct ischemia; intermediate risk   CAD, multiple vessel    Cardiac arrest (HCC) 11/25/2015   Chest tightness    CHF (congestive heart failure) (HCC)    Echocardiogram 7/22: EF 55-60, no RWMA, mod LVH, Gr 1 DD, normal RVSF, trivial MR   Chronic lower back pain    Chronic shoulder pain    "right; I've got bad rotator cuff"   COPD (chronic obstructive pulmonary disease) (HCC)    Coronary artery disease    Depression    Diabetes mellitus with complication (HCC)    Diabetes mellitus without complication (HCC)    Essential hypertension 05/20/2018   GERD (gastroesophageal reflux disease)    Headache    "monthly" (11/22/2014)   History of blood transfusion    "when I had my 1st youngun; when I had MI"   History of hiatal hernia    History of stomach ulcers    Hyperlipidemia    Hypothyroidism    PMH:   IDDM (insulin dependent diabetes mellitus)    Incidental lung nodule, > 3mm and < 8mm 11/22/2014   Small non-calcified nodules left lung   Myocardial infarction (HCC) 11/2013   PCI w/ drug eluting stent LCx   Neuropathy    Neuropathy due to secondary diabetes [facility])    Wears glasses     Past Surgical History:  Procedure Laterality Date  ABDOMINAL HYSTERECTOMY  1990's   ABDOMINAL HYSTERECTOMY     APPLICATION OF WOUND VAC N/A 11/23/2014   Procedure: POSSIBLE APPLICATION OF WOUND VAC;  Surgeon: Purcell Nails, MD;  Location: [facility] OR;  Service: Thoracic;  Laterality: N/A;   BREAST SURGERY Left 1990's   breast duct surgery   CARDIAC CATHETERIZATION  09/01/14   CARDIAC CATHETERIZATION N/A 11/25/2015   Procedure: Left Heart Cath and Cors/Grafts Angiography;  Surgeon: Tonny Bollman, MD;  Location: [facility] INVASIVE CV LAB;  Service: Cardiovascular;  Laterality: N/A;   CARDIAC CATHETERIZATION N/A 11/25/2015   Procedure: Coronary Stent Intervention;  Surgeon: Tonny Bollman, MD;  Location: [facility] INVASIVE CV LAB;  Service: Cardiovascular;  Laterality: N/A;   CARDIAC CATHETERIZATION N/A  06/19/2016   Procedure: Left Heart Cath and Cors/Grafts Angiography;  Surgeon: Peter M Sw[place], MD;  Location: [facility] INVASIVE CV LAB;  Service: Cardiovascular;  Laterality: N/A;   CARDIAC SURGERY     CESAREAN SECTION  1982; 1984   CESAREAN SECTION     COLONOSCOPY     CORONARY ANGIOPLASTY WITH STENT PLACEMENT  11/11/2013   Drug-eluting stent in LCx   CORONARY ARTERY BYPASS GRAFT N/A 09/22/2014   Procedure: CORONARY ARTERY BYPASS GRAFTING (CABG);  Surgeon: Purcell Nails, MD;  Location: [facility] OR;  Service: Open Heart Surgery;  Laterality: N/A;  Times 2 using left internal mammary artery and endoscopically harvested right saphenous vein   ESOPHAGOGASTRODUODENOSCOPY     GASTRIC RESECTION  1990's   bezoar; "for reflux"   LAPAROSCOPIC CHOLECYSTECTOMY  2000   STERNAL WOUND DEBRIDEMENT N/A 11/23/2014   Procedure: SUPERFICIAL STERNAL WOUND DEBRIDEMENT;  Surgeon: Purcell Nails, MD;  Location: [facility] OR;  Service: Thoracic;  Laterality: N/A;   TEE WITHOUT CARDIOVERSION N/A 09/22/2014   Procedure: TRANSESOPHAGEAL ECHOCARDIOGRAM (TEE);  Surgeon: Purcell Nails, MD;  Location: [facility] OR;  Service: Open Heart Surgery;  Laterality: N/A;    Current Medications: Current Meds  Medication Sig   albuterol (PROVENTIL HFA;VENTOLIN HFA) 108 (90 BASE) MCG/ACT inhaler Inhale 2 puffs into the lungs every 6 (six) hours as needed for wheezing or shortness of breath.   atorvastatin (LIPITOR) 80 MG tablet Take 1 tablet (80 mg total) by mouth daily.   carvedilol (COREG) 3.125 MG tablet Take 1 tablet (3.125 mg total) by mouth 2 (two) times daily with a meal.   Cholecalciferol (VITAMIN D3) 1.25 MG (50000 UT) CAPS Take 1 capsule by mouth daily.   clopidogrel (PLAVIX) 75 MG tablet Take 1 tablet (75 mg total) by mouth daily.   dapagliflozin propanediol (FARXIGA) 10 MG TABS tablet Take 10 mg by mouth daily.   diphenhydrAMINE-APAP, sleep, (TYLENOL PM EXTRA STRENGTH PO) Take by mouth at bedtime as needed.   fenofibrate 54 MG tablet Take 54  mg by mouth daily.   Fluticasone-Salmeterol (ADVAIR) 100-50 MCG/DOSE AEPB Inhale 1 puff into the lungs 2 (two) times daily as needed (shortness of breath).    gabapentin (NEURONTIN) 100 MG capsule Take 100 mg by mouth daily with breakfast.   insulin aspart (NOVOLOG) 100 UNIT/ML FlexPen Inject 0-5 Units into the skin daily. Based on sliding scale   Insulin Glargine-yfgn 100 UNIT/ML SOPN Take 40 Units by mouth 2 (two) times daily.   isosorbide mononitrate (IMDUR) 60 MG 24 hr tablet Take 1 tablet (60 mg total) by mouth daily.   lisinopril (ZESTRIL) 2.5 MG tablet Take 1 tablet (2.5 mg total) by mouth daily.   meclizine (ANTIVERT) 25 MG tablet Take 25 mg by mouth at bedtime.  methocarbamol (ROBAXIN) 750 MG tablet SMARTSIG:1 Tablet(s) By Mouth Every 12 Hours   nitroGLYCERIN (NITROSTAT) 0.4 MG SL tablet DISSOLVE ONE TABLET UNDER THE TONGUE EVERY 5 MINUTES AS NEEDED FOR CHEST PAIN.  DO NOT EXCEED A TOTAL OF 3 DOSES IN 15 MINUTES NOW   ondansetron (ZOFRAN) 8 MG tablet Take 8 mg by mouth every 8 (eight) hours as needed.   oxycodone (ROXICODONE) 30 MG immediate release tablet Take 15 mg by mouth 4 (four) times daily.   OXYCODONE ER PO Take 1 tablet by mouth every 8 (eight) hours as needed (PAIN).    promethazine (PHENERGAN) 12.5 MG tablet Take 12.5 mg by mouth every 8 (eight) hours as needed.   tiotropium (SPIRIVA) 18 MCG inhalation capsule Place 18 mcg into inhaler and inhale daily as needed (shortness of breath).      Allergies:   Wellbutrin [bupropion]   Social History   Socioeconomic History   Marital status: Married    Spouse name: Not on file   Number of children: Not on file   Years of education: Not on file   Highest education level: Not on file  Occupational History   Not on file  Tobacco Use   Smoking status: Every Day    Current packs/day: 2.00    Average packs/day: 2.0 packs/day for 41.0 years (82.0 ttl pk-yrs)    Types: Cigarettes   Smokeless tobacco: Never  Vaping Use   Vaping  status: Never Used  Substance and Sexual Activity   Alcohol use: Not Currently   Drug use: No   Sexual activity: Yes  Other Topics Concern   Not on file  Social History Narrative   ** Merged History Encounter **       Social Determinants of Health   Financial Resource Strain: Low Risk  (11/09/2022)   Received from [facility], [facility]   Overall Financial Resource Strain (CARDIA)    Difficulty of Paying Living Expenses: Not very hard  Food Insecurity: No Food Insecurity (11/09/2022)   Received from [facility], [facility]   Hunger Vital Sign    Worried About Running Out of Food in the Last Year: Never true    Ran Out of Food in the Last Year: Never true  Transportation Needs: No Transportation Needs (11/09/2022)   Received from [facility], [facility]   PRAPARE - Transportation    Lack of Transportation (Medical): No    Lack of Transportation (Non-Medical): No  Physical Activity: Not on file  Stress: Not on file  Social Connections: Unknown (01/04/2022)   Received from [facility], [facility]   Social Network    Social Network: Not on file     Family History: The patient's She was adopted. Family history is unknown by patient.  ROS:   Please see the history of present illness.    All other systems reviewed and are negative.  EKGs/Labs/Other Studies Reviewed:    The following studies were reviewed today: EKG Interpretation Date/Time:  Thursday March 28 2023 15:12:50 EDT Ventricular Rate:  87 PR Interval:  140 QRS Duration:  108 QT Interval:  346 QTC Calculation: 416 R Axis:   54  Text Interpretation: Normal sinus rhythm Cannot rule out Inferior infarct , age undetermined T wave abnormality, consider lateral ischemia When compared with ECG of 21-Mar-2021 12:20, Criteria for Anterior infarct are no longer Present Minimal criteria for Inferior infarct are now Present T wave inversion now evident in Lateral leads Confirmed by Tonny Bollman [phone]) on  03/28/2023 3:20:34 PM    Recent Labs: 06/15/2022: ALT 15  Recent Lipid Panel    Component Value Date/Time   CHOL 100 06/15/2022 0902   TRIG 128 06/15/2022 0902   HDL 40 06/15/2022 0902   CHOLHDL 2.5 06/15/2022 0902   CHOLHDL 4.5 01/31/2016 0743   VLDL 57 (H) 01/31/2016 0743   LDLCALC 37 06/15/2022 0902   LDLDIRECT 152.0 04/20/2015 1450     Risk Assessment/Calculations:                Physical Exam:    VS:  BP 100/62   Pulse 87   Ht 5\' 2"  (1.575 m)   Wt 185 lb (83.9 kg)   SpO2 97%   BMI 33.84 kg/m     Wt Readings from Last 3 Encounters:  03/28/23 185 lb (83.9 kg)  03/07/22 197 lb 9.6 oz (89.6 kg)  08/09/21 198 lb (89.8 kg)     GEN: Chronically ill-appearing woman in no acute distress HEENT: Normal NECK: No JVD; No carotid bruits LYMPHATICS: No lymphadenopathy CARDIAC: RRR, no murmurs, rubs, gallops RESPIRATORY:  Clear to auscultation without rales, wheezing or rhonchi  ABDOMEN: Soft, non-tender, non-distended MUSCULOSKELETAL:  No edema; No deformity  SKIN: Warm and dry NEUROLOGIC:  Alert and oriented x 3 PSYCHIATRIC:  Normal affect   ASSESSMENT:    1. Medication management   2. Coronary artery disease involving native coronary artery of native heart with angina pectoris (HCC)   3. Mixed hyperlipidemia   4. Tobacco abuse    PLAN:    In order of problems listed above:  Patient on multidrug antianginal therapy, clopidogrel for antiplatelet therapy, high intensity statin drug, and [place].  Will continue current management. Chest pain with typical and atypical features.  Last Myoview scan performed 2 years ago with stable findings and recommendation for ongoing medical management.  With her continued symptoms of chest pain, will repeat a Lexiscan Myoview stress test.  Continue isosorbide and carvedilol at current doses.  Blood pressure is well-controlled. Treated with atorvastatin 80 mg daily.  Cholesterol is 100, LDL 37. Cessation counseling done.   Patient is a heavy smoker.  States that she is not ready to quit will but will do her best to cut back.      Informed Consent   Shared Decision Making/Informed Consent The risks [chest pain, shortness of breath, cardiac arrhythmias, dizziness, blood pressure fluctuations, myocardial infarction, stroke/transient ischemic attack, nausea, vomiting, allergic reaction, radiation exposure, metallic taste sensation and life-threatening complications (estimated to be 1 in 10,000)], benefits (risk stratification, diagnosing coronary artery disease, treatment guidance) and alternatives of a nuclear stress test were discussed in detail with Ms. Wentland and she agrees to proceed.       Medication Adjustments/Labs and Tests Ordered: Current medicines are reviewed at length with the patient today.  Concerns regarding medicines are outlined above.  Orders Placed This Encounter  Procedures   MYOCARDIAL PERFUSION IMAGING   EKG 12-Lead   No orders of the defined types were placed in this encounter.   Patient Instructions  Medication Instructions:  Your physician recommends that you continue on your current medications as directed. Please refer to the Current Medication list given to you today.  *If you need a refill on your cardiac medications before your next appointment, please call your pharmacy*  Lab Work: NONE If you have labs (blood work) drawn today and your tests are completely normal, you will receive your results only by: MyChart Message (if you have MyChart) OR  A paper copy in the mail If you have any lab test that is abnormal or we need to change your treatment, we will call you to review the results.  Testing/Procedures: Lexiscan stress test Your physician has requested that you have a lexiscan myoview. For further information please visit [email or web address] Please follow instruction sheet, as given.  Follow-Up: At [facility], you and your health needs are our priority.   As part of our continuing mission to provide you with exceptional heart care, we have created designated Provider Care Teams.  These Care Teams include your primary Cardiologist (physician) and Advanced Practice Providers (APPs -  Physician Assistants and Nurse Practitioners) who all work together to provide you with the care you need, when you need it.  We recommend signing up for the patient portal called "MyChart".  Sign up information is provided on this After Visit Summary.  MyChart is used to connect with patients for Virtual Visits (Telemedicine).  Patients are able to view lab/test results, encounter notes, upcoming appointments, etc.  Non-urgent messages can be sent to your provider as well.   To learn more about what you can do with MyChart, go to [email or web address].    Your next appointment:   1 year(s)  Provider:   Tonny Bollman, MD        Signed, Tonny Bollman, MD  03/28/2023 3:39 PM    [facility]

## 2023-03-28 NOTE — Patient Instructions (Signed)
Medication Instructions:  Your physician recommends that you continue on your current medications as directed. Please refer to the Current Medication list given to you today.  *If you need a refill on your cardiac medications before your next appointment, please call your pharmacy*  Lab Work: NONE If you have labs (blood work) drawn today and your tests are completely normal, you will receive your results only by: MyChart Message (if you have MyChart) OR A paper copy in the mail If you have any lab test that is abnormal or we need to change your treatment, we will call you to review the results.  Testing/Procedures: Lexiscan stress test Your physician has requested that you have a lexiscan myoview. For further information please visit [email or web address] Please follow instruction sheet, as given.  Follow-Up: At [facility], you and your health needs are our priority.  As part of our continuing mission to provide you with exceptional heart care, we have created designated Provider Care Teams.  These Care Teams include your primary Cardiologist (physician) and Advanced Practice Providers (APPs -  Physician Assistants and Nurse Practitioners) who all work together to provide you with the care you need, when you need it.  We recommend signing up for the patient portal called "MyChart".  Sign up information is provided on this After Visit Summary.  MyChart is used to connect with patients for Virtual Visits (Telemedicine).  Patients are able to view lab/test results, encounter notes, upcoming appointments, etc.  Non-urgent messages can be sent to your provider as well.   To learn more about what you can do with MyChart, go to [email or web address].    Your next appointment:   1 year(s)  Provider:   Tonny Bollman, MD

## 2023-04-12 ENCOUNTER — Ambulatory Visit (HOSPITAL_COMMUNITY): Payer: 59 | Attending: Internal Medicine

## 2023-04-12 DIAGNOSIS — I25119 Atherosclerotic heart disease of native coronary artery with unspecified angina pectoris: Secondary | ICD-10-CM | POA: Diagnosis not present

## 2023-04-12 DIAGNOSIS — Z79899 Other long term (current) drug therapy: Secondary | ICD-10-CM

## 2023-04-12 LAB — MYOCARDIAL PERFUSION IMAGING
LV dias vol: 63 mL (ref 46–106)
LV sys vol: 31 mL
Nuc Stress EF: 50 %
Peak HR: 109 {beats}/min
Rest HR: 88 {beats}/min
Rest Nuclear Isotope Dose: 9.7 mCi
SDS: 6
SRS: 2
SSS: 8
Stress Nuclear Isotope Dose: 30.3 mCi
TID: 1.09

## 2023-04-12 MED ORDER — TECHNETIUM TC 99M TETROFOSMIN IV KIT
30.3000 | PACK | Freq: Once | INTRAVENOUS | Status: AC | PRN
  Administered 2023-04-12: 30.3 via INTRAVENOUS

## 2023-04-12 MED ORDER — TECHNETIUM TC 99M TETROFOSMIN IV KIT
9.7000 | PACK | Freq: Once | INTRAVENOUS | Status: AC | PRN
  Administered 2023-04-12: 9.7 via INTRAVENOUS

## 2023-04-12 MED ORDER — REGADENOSON 0.4 MG/5ML IV SOLN
0.4000 mg | Freq: Once | INTRAVENOUS | Status: AC
  Administered 2023-04-12: 0.4 mg via INTRAVENOUS

## 2023-04-17 ENCOUNTER — Other Ambulatory Visit: Payer: Self-pay

## 2023-04-17 DIAGNOSIS — I10 Essential (primary) hypertension: Secondary | ICD-10-CM

## 2023-04-17 DIAGNOSIS — I255 Ischemic cardiomyopathy: Secondary | ICD-10-CM

## 2023-04-17 DIAGNOSIS — I25118 Atherosclerotic heart disease of native coronary artery with other forms of angina pectoris: Secondary | ICD-10-CM

## 2023-04-17 MED ORDER — CLOPIDOGREL BISULFATE 75 MG PO TABS: 75.0000 mg | ORAL_TABLET | Freq: Every day | ORAL | 3 refills | Status: DC

## 2023-04-17 MED ORDER — CARVEDILOL 3.125 MG PO TABS: 3.1250 mg | ORAL_TABLET | Freq: Two times a day (BID) | ORAL | 3 refills | Status: DC

## 2023-04-17 MED ORDER — LISINOPRIL 2.5 MG PO TABS
2.5000 mg | ORAL_TABLET | Freq: Every day | ORAL | 3 refills | Status: DC
Start: 2023-04-17 — End: 2024-01-10

## 2023-04-17 MED ORDER — ATORVASTATIN CALCIUM 80 MG PO TABS: 80.0000 mg | ORAL_TABLET | Freq: Every day | ORAL | 3 refills | Status: DC

## 2023-04-17 MED ORDER — NITROGLYCERIN 0.4 MG SL SUBL: SUBLINGUAL_TABLET | SUBLINGUAL | 11 refills | Status: DC

## 2023-04-17 MED ORDER — ISOSORBIDE MONONITRATE ER 60 MG PO TB24: 60.0000 mg | ORAL_TABLET | Freq: Every day | ORAL | 3 refills | Status: DC

## 2023-04-17 NOTE — Telephone Encounter (Signed)
Pt's medications were sent to pt's pharmacy as requested. Confirmation received.  

## 2023-04-23 DIAGNOSIS — M791 Myalgia, unspecified site: Secondary | ICD-10-CM | POA: Diagnosis not present

## 2023-04-23 DIAGNOSIS — M47818 Spondylosis without myelopathy or radiculopathy, sacral and sacrococcygeal region: Secondary | ICD-10-CM | POA: Diagnosis not present

## 2023-04-23 DIAGNOSIS — M461 Sacroiliitis, not elsewhere classified: Secondary | ICD-10-CM | POA: Diagnosis not present

## 2023-08-13 DIAGNOSIS — Z79891 Long term (current) use of opiate analgesic: Secondary | ICD-10-CM | POA: Diagnosis not present

## 2023-09-13 DIAGNOSIS — F331 Major depressive disorder, recurrent, moderate: Secondary | ICD-10-CM | POA: Diagnosis not present

## 2023-09-13 DIAGNOSIS — R251 Tremor, unspecified: Secondary | ICD-10-CM | POA: Diagnosis not present

## 2023-09-13 DIAGNOSIS — E11319 Type 2 diabetes mellitus with unspecified diabetic retinopathy without macular edema: Secondary | ICD-10-CM | POA: Diagnosis not present

## 2023-09-13 DIAGNOSIS — Z794 Long term (current) use of insulin: Secondary | ICD-10-CM | POA: Diagnosis not present

## 2024-01-10 ENCOUNTER — Other Ambulatory Visit: Payer: Self-pay

## 2024-01-10 DIAGNOSIS — I10 Essential (primary) hypertension: Secondary | ICD-10-CM

## 2024-01-10 DIAGNOSIS — I255 Ischemic cardiomyopathy: Secondary | ICD-10-CM

## 2024-01-10 MED ORDER — ISOSORBIDE MONONITRATE ER 60 MG PO TB24: 60.0000 mg | ORAL_TABLET | Freq: Every day | ORAL | 0 refills | Status: DC

## 2024-01-10 MED ORDER — CLOPIDOGREL BISULFATE 75 MG PO TABS: 75.0000 mg | ORAL_TABLET | Freq: Every day | ORAL | 0 refills | Status: DC

## 2024-01-10 MED ORDER — ATORVASTATIN CALCIUM 80 MG PO TABS: 80.0000 mg | ORAL_TABLET | Freq: Every day | ORAL | 0 refills | Status: DC

## 2024-01-10 MED ORDER — CARVEDILOL 3.125 MG PO TABS: 3.1250 mg | ORAL_TABLET | Freq: Two times a day (BID) | ORAL | 0 refills | Status: DC

## 2024-01-10 MED ORDER — LISINOPRIL 2.5 MG PO TABS
2.5000 mg | ORAL_TABLET | Freq: Every day | ORAL | 0 refills | Status: DC
Start: 2024-01-10 — End: 2024-04-07

## 2024-03-26 ENCOUNTER — Other Ambulatory Visit: Payer: Self-pay

## 2024-03-26 ENCOUNTER — Emergency Department (HOSPITAL_BASED_OUTPATIENT_CLINIC_OR_DEPARTMENT_OTHER): Admission: EM | Admit: 2024-03-26 | Discharge: 2024-03-26 | Disposition: A | Discharge status: Home / Self Care

## 2024-03-26 ENCOUNTER — Emergency Department (HOSPITAL_BASED_OUTPATIENT_CLINIC_OR_DEPARTMENT_OTHER)

## 2024-03-26 ENCOUNTER — Encounter (HOSPITAL_BASED_OUTPATIENT_CLINIC_OR_DEPARTMENT_OTHER): Payer: Self-pay

## 2024-03-26 DIAGNOSIS — W19XXXA Unspecified fall, initial encounter: Secondary | ICD-10-CM | POA: Diagnosis not present

## 2024-03-26 DIAGNOSIS — S99921A Unspecified injury of right foot, initial encounter: Secondary | ICD-10-CM | POA: Diagnosis present

## 2024-03-26 DIAGNOSIS — S92354A Nondisplaced fracture of fifth metatarsal bone, right foot, initial encounter for closed fracture: Secondary | ICD-10-CM | POA: Diagnosis not present

## 2024-03-26 MED ORDER — ONDANSETRON 4 MG PO TBDP: 4.0000 mg | ORAL_TABLET | Freq: Once | ORAL | Status: DC

## 2024-03-26 MED ORDER — ONDANSETRON HCL 4 MG PO TABS: 4.0000 mg | ORAL_TABLET | Freq: Three times a day (TID) | ORAL | 0 refills | Status: DC | PRN

## 2024-03-26 MED ORDER — PROMETHAZINE HCL 25 MG PO TABS
25.0000 mg | ORAL_TABLET | Freq: Four times a day (QID) | ORAL | 0 refills | Status: DC | PRN
Start: 2024-03-26 — End: 2024-04-03

## 2024-03-26 NOTE — Discharge Instructions (Signed)
 Wear your postop shoe until you see orthopedic surgery.  Call the office tomorrow to make a follow-up appointment.  You can use Tylenol  and Motrin as needed for pain.

## 2024-03-26 NOTE — ED Triage Notes (Signed)
 Pt reports R foot swelling x1 week after falling. Pt denies any other injuries.

## 2024-03-26 NOTE — ED Provider Notes (Signed)
 [place] [facility]Y DEPARTMENT AT [facility] Provider Note   CSN: [id number] Arrival date & time: 03/26/24  2102     Patient presents with: Foot Injury   Sara Hobbs is a 63 y.o. female.   63 year old female presents for evaluation of right foot and ankle pain.  States she fell about a week [place] the pain has been getting worse.  States she is noticed worsening swelling over her right ankle as well.  Denies hitting her head or losing consciousness.  Denies any other symptoms or concerns at this time.   Foot Injury Associated symptoms: no back pain and no fever        Prior to Admission medications   Medication Sig Start Date End Date Taking? Authorizing Provider  ondansetron  (ZOFRAN ) 4 MG tablet Take 1 tablet (4 mg total) by mouth every 8 (eight) hours as needed for up to 4 days for nausea or vomiting. 03/26/24 03/30/24 Yes Stella Encarnacion L, DO  albuterol  (PROVENTIL  HFA;VENTOLIN  HFA) 108 (90 BASE) MCG/ACT inhaler Inhale 2 puffs into the lungs every 6 (six) hours as needed for wheezing or shortness of breath.    [provider]  atorvastatin  (LIPITOR ) 80 MG tablet Take 1 tablet (80 mg total) by mouth daily. 01/10/24   Wonda Sharper, MD  carvedilol  (COREG ) 3.125 MG tablet Take 1 tablet (3.125 mg total) by mouth 2 (two) times daily with a meal. 01/10/24   Wonda Sharper, MD  Cholecalciferol (VITAMIN D3) 1.25 MG (50000 UT) CAPS Take 1 capsule by mouth daily. 01/31/21   [provider]  clopidogrel  (PLAVIX ) 75 MG tablet Take 1 tablet (75 mg total) by mouth daily. 01/10/24   Wonda Sharper, MD  dapagliflozin propanediol (FARXIGA) 10 MG TABS tablet Take 10 mg by mouth daily. 01/22/20   [provider]  diphenhydrAMINE-APAP, sleep, (TYLENOL  PM EXTRA STRENGTH PO) Take by mouth at bedtime as needed.    [provider]  fenofibrate  54 MG tablet Take 54 mg by mouth daily.    [provider]  Fluticasone-Salmeterol (ADVAIR) 100-50 MCG/DOSE  AEPB Inhale 1 puff into the lungs 2 (two) times daily as needed (shortness of breath).     [provider]  gabapentin  (NEURONTIN ) 100 MG capsule Take 100 mg by mouth daily with breakfast. 04/15/20   [provider]  insulin  aspart (NOVOLOG ) 100 UNIT/ML FlexPen Inject 0-5 Units into the skin daily. Based on sliding scale    [provider]  Insulin  Glargine-yfgn 100 UNIT/ML SOPN Take 40 Units by mouth 2 (two) times daily.    [provider]  isosorbide  mononitrate (IMDUR ) 60 MG 24 hr tablet Take 1 tablet (60 mg total) by mouth daily. 01/10/24   Wonda Sharper, MD  lisinopril  (ZESTRIL ) 2.5 MG tablet Take 1 tablet (2.5 mg total) by mouth daily. 01/10/24   Cooper, Michael, MD  meclizine (ANTIVERT) 25 MG tablet Take 25 mg by mouth at bedtime.    [provider]  methocarbamol (ROBAXIN) 750 MG tablet SMARTSIG:1 Tablet(s) By Mouth Every 12 Hours 07/14/21   [provider]  nitroGLYCERIN  (NITROSTAT ) 0.4 MG SL tablet DISSOLVE ONE TABLET UNDER THE TONGUE EVERY 5 MINUTES AS NEEDED FOR CHEST PAIN.  DO NOT EXCEED A TOTAL OF 3 DOSES IN 15 MINUTES NOW 04/17/23   Wonda Sharper, MD  ondansetron  (ZOFRAN ) 8 MG tablet Take 8 mg by mouth every 8 (eight) hours as needed. 06/08/21   [provider]  oxycodone  (ROXICODONE ) 30 MG immediate release tablet Take 15 mg by  mouth 4 (four) times daily. 07/10/21   [provider]  OXYCODONE  ER PO Take 1 tablet by mouth every 8 (eight) hours as needed (PAIN).     [provider]  promethazine  (PHENERGAN ) 12.5 MG tablet Take 12.5 mg by mouth every 8 (eight) hours as needed. 07/14/21   [provider]  tiotropium (SPIRIVA ) 18 MCG inhalation capsule Place 18 mcg into inhaler and inhale daily as needed (shortness of breath).     [provider]    Allergies: Wellbutrin [bupropion]    Review of Systems  Constitutional:  Negative for chills and fever.  HENT:  Negative for ear pain and sore  throat.   Eyes:  Negative for pain and visual disturbance.  Respiratory:  Negative for cough and shortness of breath.   Cardiovascular:  Negative for chest pain and palpitations.  Gastrointestinal:  Negative for abdominal pain and vomiting.  Genitourinary:  Negative for dysuria and hematuria.  Musculoskeletal:  Negative for arthralgias and back pain.       Admits right ankle and leg pain   Skin:  Negative for color change and rash.  Neurological:  Negative for seizures and syncope.  All other systems reviewed and are negative.   Updated Vital Signs BP 121/64 (BP Location: Right Arm)   Pulse 100   Temp 97.8 F (36.6 C) (Tympanic)   Resp 18   Ht 5' (1.524 m)   Wt 72.6 kg   SpO2 100%   BMI 31.25 kg/m   Physical Exam Vitals and nursing note reviewed.  Constitutional:      General: She is not in acute distress.    Appearance: She is well-developed.  HENT:     Head: Normocephalic and atraumatic.  Eyes:     Conjunctiva/sclera: Conjunctivae normal.  Cardiovascular:     Rate and Rhythm: Normal rate and regular rhythm.     Heart sounds: No murmur heard. Pulmonary:     Effort: Pulmonary effort is normal. No respiratory distress.     Breath sounds: Normal breath sounds.  Abdominal:     Palpations: Abdomen is soft.     Tenderness: There is no abdominal tenderness.  Musculoskeletal:        General: Swelling present.     Cervical back: Neck supple.     Comments: There is swelling over the lateral right heel and right lateral malleolus as well as tenderness to palpation and erythema ecchymosis over this region, there is full range of motion of the ankle in dorsi and plantarflexion but limited range of motion and eversion.  Patient also with some tenderness to palpation and ecchymosis and swelling over the lateral upper tibia  Skin:    General: Skin is warm and dry.     Capillary Refill: Capillary refill takes less than 2 seconds.  Neurological:     Mental Status: She is alert.   Psychiatric:        Mood and Affect: Mood normal.     (all labs ordered are listed, but only abnormal results are displayed) Labs Reviewed - No data to display  EKG: None  Radiology: DG Tibia/Fibula Right Result Date: 03/26/2024 CLINICAL DATA:  Status post fall 1 month ago with subsequent right foot swelling. EXAM: RIGHT TIBIA AND FIBULA - 2 VIEW COMPARISON:  None Available. FINDINGS: There is no evidence of fracture or other focal bone lesions. Radiopaque surgical clips are seen within the soft tissues of the posterior and medial right knee. Soft tissues are otherwise unremarkable. IMPRESSION: No  acute osseous abnormality. Electronically Signed   By: Suzen Dials M.D.   On: 03/26/2024 21:39   DG Foot Complete Right Result Date: 03/26/2024 CLINICAL DATA:  Status post fall 1 week ago with subsequent right foot swelling. EXAM: RIGHT FOOT COMPLETE - 3+ VIEW COMPARISON:  None Available. FINDINGS: An acute nondisplaced fracture deformity is seen involving the mid to distal aspect of the fifth right metatarsal. There is no evidence of dislocation. There is no evidence of arthropathy or other focal bone abnormality. Moderate severity soft tissue swelling is seen along the dorsal aspect of the distal right foot. IMPRESSION: Acute nondisplaced fracture of the fifth right metatarsal. Electronically Signed   By: Suzen Dials M.D.   On: 03/26/2024 21:23     Procedures   Medications Ordered in the ED  ondansetron  (ZOFRAN -ODT) disintegrating tablet 4 mg (has no administration in time range)                                    Medical Decision Making Patient with fifth metatarsal fracture that is nondisplaced.  Will put an Ace wrap on her ankle and give her a postop shoe.  Advised Tylenol  Motrin as needed for pain.  Requesting Zofran  here.  I gave her 1 a prescription for this.  Advised to follow-up with orthopedic surgery.  Declined any other medication for pain.  Advise return for new or  worsening symptoms.  She feels comfortable to plan to be discharged home.  Problems Addressed: Closed nondisplaced fracture of fifth metatarsal bone of right foot, initial encounter: acute illness or injury  Amount and/or Complexity of Data Reviewed External Data Reviewed: notes.    Details: Outpatient records reviewed and patient followed up in the office about 2 weeks ago for otitis media  Radiology: ordered and independent interpretation performed. Decision-making details documented in ED Course.    Details: Ordered and interpreted by me independently of radiology:  Left foot xray: shows nondisplaced 5th metatarsal fracture Tib/Fib xray: shows no acute abnormality   Risk OTC drugs. Prescription drug management.     Final diagnoses:  Closed nondisplaced fracture of fifth metatarsal bone of right foot, initial encounter    ED Discharge Orders          Ordered    ondansetron  (ZOFRAN ) 4 MG tablet  Every 8 hours PRN        03/26/24 2154               Gennaro Bouchard L, DO 03/26/24 2225

## 2024-04-01 ENCOUNTER — Emergency Department (HOSPITAL_COMMUNITY)

## 2024-04-01 ENCOUNTER — Inpatient Hospital Stay (HOSPITAL_COMMUNITY)
Admission: EM | Admit: 2024-04-01 | Discharge: 2024-04-07 | DRG: 312 | Disposition: A | Discharge status: Home / Self Care | Source: Ambulatory Visit | Attending: Internal Medicine | Admitting: Internal Medicine

## 2024-04-01 ENCOUNTER — Other Ambulatory Visit: Payer: Self-pay

## 2024-04-01 ENCOUNTER — Ambulatory Visit: Payer: Self-pay | Attending: Physician Assistant | Admitting: Physician Assistant

## 2024-04-01 ENCOUNTER — Encounter: Payer: Self-pay | Admitting: Physician Assistant

## 2024-04-01 VITALS — BP 78/58 | HR 68

## 2024-04-01 DIAGNOSIS — M19041 Primary osteoarthritis, right hand: Secondary | ICD-10-CM | POA: Diagnosis present

## 2024-04-01 DIAGNOSIS — S92354A Nondisplaced fracture of fifth metatarsal bone, right foot, initial encounter for closed fracture: Secondary | ICD-10-CM | POA: Diagnosis present

## 2024-04-01 DIAGNOSIS — R571 Hypovolemic shock: Secondary | ICD-10-CM | POA: Diagnosis present

## 2024-04-01 DIAGNOSIS — E785 Hyperlipidemia, unspecified: Secondary | ICD-10-CM | POA: Diagnosis present

## 2024-04-01 DIAGNOSIS — I951 Orthostatic hypotension: Secondary | ICD-10-CM | POA: Diagnosis present

## 2024-04-01 DIAGNOSIS — I25118 Atherosclerotic heart disease of native coronary artery with other forms of angina pectoris: Secondary | ICD-10-CM | POA: Diagnosis present

## 2024-04-01 DIAGNOSIS — D751 Secondary polycythemia: Secondary | ICD-10-CM | POA: Diagnosis present

## 2024-04-01 DIAGNOSIS — R17 Unspecified jaundice: Secondary | ICD-10-CM | POA: Diagnosis present

## 2024-04-01 DIAGNOSIS — I110 Hypertensive heart disease with heart failure: Secondary | ICD-10-CM | POA: Diagnosis present

## 2024-04-01 DIAGNOSIS — R55 Syncope and collapse: Secondary | ICD-10-CM

## 2024-04-01 DIAGNOSIS — Z6832 Body mass index (BMI) 32.0-32.9, adult: Secondary | ICD-10-CM | POA: Diagnosis not present

## 2024-04-01 DIAGNOSIS — G894 Chronic pain syndrome: Secondary | ICD-10-CM | POA: Diagnosis present

## 2024-04-01 DIAGNOSIS — Z9049 Acquired absence of other specified parts of digestive tract: Secondary | ICD-10-CM

## 2024-04-01 DIAGNOSIS — E876 Hypokalemia: Secondary | ICD-10-CM | POA: Diagnosis present

## 2024-04-01 DIAGNOSIS — Z79899 Other long term (current) drug therapy: Secondary | ICD-10-CM

## 2024-04-01 DIAGNOSIS — J4489 Other specified chronic obstructive pulmonary disease: Secondary | ICD-10-CM | POA: Diagnosis present

## 2024-04-01 DIAGNOSIS — E039 Hypothyroidism, unspecified: Secondary | ICD-10-CM | POA: Diagnosis present

## 2024-04-01 DIAGNOSIS — Z955 Presence of coronary angioplasty implant and graft: Secondary | ICD-10-CM

## 2024-04-01 DIAGNOSIS — Z90710 Acquired absence of both cervix and uterus: Secondary | ICD-10-CM

## 2024-04-01 DIAGNOSIS — Z7951 Long term (current) use of inhaled steroids: Secondary | ICD-10-CM | POA: Diagnosis not present

## 2024-04-01 DIAGNOSIS — M545 Low back pain, unspecified: Secondary | ICD-10-CM | POA: Diagnosis present

## 2024-04-01 DIAGNOSIS — M25511 Pain in right shoulder: Secondary | ICD-10-CM | POA: Diagnosis present

## 2024-04-01 DIAGNOSIS — M19071 Primary osteoarthritis, right ankle and foot: Secondary | ICD-10-CM | POA: Diagnosis present

## 2024-04-01 DIAGNOSIS — I255 Ischemic cardiomyopathy: Secondary | ICD-10-CM | POA: Diagnosis present

## 2024-04-01 DIAGNOSIS — I428 Other cardiomyopathies: Secondary | ICD-10-CM | POA: Diagnosis present

## 2024-04-01 DIAGNOSIS — Z888 Allergy status to other drugs, medicaments and biological substances status: Secondary | ICD-10-CM

## 2024-04-01 DIAGNOSIS — M47812 Spondylosis without myelopathy or radiculopathy, cervical region: Secondary | ICD-10-CM | POA: Diagnosis present

## 2024-04-01 DIAGNOSIS — F17210 Nicotine dependence, cigarettes, uncomplicated: Secondary | ICD-10-CM | POA: Diagnosis present

## 2024-04-01 DIAGNOSIS — Z794 Long term (current) use of insulin: Secondary | ICD-10-CM

## 2024-04-01 DIAGNOSIS — I5032 Chronic diastolic (congestive) heart failure: Secondary | ICD-10-CM | POA: Diagnosis present

## 2024-04-01 DIAGNOSIS — I10 Essential (primary) hypertension: Secondary | ICD-10-CM | POA: Diagnosis not present

## 2024-04-01 DIAGNOSIS — E1165 Type 2 diabetes mellitus with hyperglycemia: Secondary | ICD-10-CM | POA: Diagnosis present

## 2024-04-01 DIAGNOSIS — E1142 Type 2 diabetes mellitus with diabetic polyneuropathy: Secondary | ICD-10-CM | POA: Diagnosis present

## 2024-04-01 DIAGNOSIS — E861 Hypovolemia: Secondary | ICD-10-CM | POA: Diagnosis present

## 2024-04-01 DIAGNOSIS — E669 Obesity, unspecified: Secondary | ICD-10-CM | POA: Diagnosis present

## 2024-04-01 DIAGNOSIS — M160 Bilateral primary osteoarthritis of hip: Secondary | ICD-10-CM | POA: Diagnosis present

## 2024-04-01 DIAGNOSIS — Z7902 Long term (current) use of antithrombotics/antiplatelets: Secondary | ICD-10-CM

## 2024-04-01 DIAGNOSIS — I959 Hypotension, unspecified: Principal | ICD-10-CM

## 2024-04-01 DIAGNOSIS — Z951 Presence of aortocoronary bypass graft: Secondary | ICD-10-CM

## 2024-04-01 DIAGNOSIS — K529 Noninfective gastroenteritis and colitis, unspecified: Secondary | ICD-10-CM | POA: Diagnosis present

## 2024-04-01 DIAGNOSIS — W19XXXA Unspecified fall, initial encounter: Secondary | ICD-10-CM | POA: Diagnosis present

## 2024-04-01 DIAGNOSIS — Z91148 Patient's other noncompliance with medication regimen for other reason: Secondary | ICD-10-CM

## 2024-04-01 DIAGNOSIS — Z8711 Personal history of peptic ulcer disease: Secondary | ICD-10-CM

## 2024-04-01 DIAGNOSIS — R911 Solitary pulmonary nodule: Secondary | ICD-10-CM | POA: Diagnosis present

## 2024-04-01 DIAGNOSIS — I252 Old myocardial infarction: Secondary | ICD-10-CM

## 2024-04-01 DIAGNOSIS — K219 Gastro-esophageal reflux disease without esophagitis: Secondary | ICD-10-CM | POA: Diagnosis present

## 2024-04-01 DIAGNOSIS — M7989 Other specified soft tissue disorders: Secondary | ICD-10-CM | POA: Diagnosis not present

## 2024-04-01 DIAGNOSIS — I340 Nonrheumatic mitral (valve) insufficiency: Secondary | ICD-10-CM | POA: Diagnosis present

## 2024-04-01 DIAGNOSIS — M19042 Primary osteoarthritis, left hand: Secondary | ICD-10-CM | POA: Diagnosis present

## 2024-04-01 DIAGNOSIS — R519 Headache, unspecified: Secondary | ICD-10-CM | POA: Diagnosis present

## 2024-04-01 DIAGNOSIS — R578 Other shock: Secondary | ICD-10-CM | POA: Diagnosis not present

## 2024-04-01 DIAGNOSIS — M19072 Primary osteoarthritis, left ankle and foot: Secondary | ICD-10-CM | POA: Diagnosis present

## 2024-04-01 DIAGNOSIS — Z7984 Long term (current) use of oral hypoglycemic drugs: Secondary | ICD-10-CM

## 2024-04-01 DIAGNOSIS — F419 Anxiety disorder, unspecified: Secondary | ICD-10-CM | POA: Diagnosis present

## 2024-04-01 DIAGNOSIS — M170 Bilateral primary osteoarthritis of knee: Secondary | ICD-10-CM | POA: Diagnosis present

## 2024-04-01 DIAGNOSIS — R296 Repeated falls: Secondary | ICD-10-CM | POA: Diagnosis present

## 2024-04-01 DIAGNOSIS — I2510 Atherosclerotic heart disease of native coronary artery without angina pectoris: Secondary | ICD-10-CM | POA: Diagnosis not present

## 2024-04-01 DIAGNOSIS — Z8674 Personal history of sudden cardiac arrest: Secondary | ICD-10-CM

## 2024-04-01 DIAGNOSIS — Z79891 Long term (current) use of opiate analgesic: Secondary | ICD-10-CM

## 2024-04-01 LAB — COMPREHENSIVE METABOLIC PANEL WITH GFR
ALT: 45 U/L — ABNORMAL HIGH (ref 0–44)
AST: 21 U/L (ref 15–41)
Albumin: 3.2 g/dL — ABNORMAL LOW (ref 3.5–5.0)
Alkaline Phosphatase: 81 U/L (ref 38–126)
Anion gap: 10 (ref 5–15)
BUN: 17 mg/dL (ref 8–23)
CO2: 27 mmol/L (ref 22–32)
Calcium: 8.5 mg/dL — ABNORMAL LOW (ref 8.9–10.3)
Chloride: 97 mmol/L — ABNORMAL LOW (ref 98–111)
Creatinine, Ser: 1.04 mg/dL — ABNORMAL HIGH (ref 0.44–1.00)
GFR, Estimated: >60 mL/min (ref >60)
Glucose, Bld: 472 mg/dL — ABNORMAL HIGH (ref 70–99)
Potassium: 3.3 mmol/L — ABNORMAL LOW (ref 3.5–5.1)
Sodium: 134 mmol/L — ABNORMAL LOW (ref 135–145)
Total Bilirubin: 0.4 mg/dL (ref 0.0–1.2)
Total Protein: 5.1 g/dL — ABNORMAL LOW (ref 6.5–8.1)

## 2024-04-01 LAB — CBC WITH DIFFERENTIAL/PLATELET
Abs Immature Granulocytes: 0.06 K/uL (ref 0.00–0.07)
Basophils Absolute: 0.1 K/uL (ref 0.0–0.1)
Basophils Relative: 1 %
Eosinophils Absolute: 0.1 K/uL (ref 0.0–0.5)
Eosinophils Relative: 2 %
HCT: 35 % — ABNORMAL LOW (ref 36.0–46.0)
Hemoglobin: 12.1 g/dL (ref 12.0–15.0)
Immature Granulocytes: 1 %
Lymphocytes Relative: 20 %
Lymphs Abs: 1.8 K/uL (ref 0.7–4.0)
MCH: 31.7 pg (ref 26.0–34.0)
MCHC: 34.6 g/dL (ref 30.0–36.0)
MCV: 91.6 fL (ref 80.0–100.0)
Monocytes Absolute: 0.6 K/uL (ref 0.1–1.0)
Monocytes Relative: 7 %
Neutro Abs: 6.3 K/uL (ref 1.7–7.7)
Neutrophils Relative %: 69 %
Platelets: 166 K/uL (ref 150–400)
RBC: 3.82 MIL/uL — ABNORMAL LOW (ref 3.87–5.11)
RDW: 12.3 % (ref 11.5–15.5)
WBC: 8.9 K/uL (ref 4.0–10.5)
nRBC: 0 % (ref 0.0–0.2)

## 2024-04-01 LAB — BLOOD GAS, VENOUS
Acid-Base Excess: 3.8 mmol/L — ABNORMAL HIGH (ref 0.0–2.0)
Bicarbonate: 29.2 mmol/L — ABNORMAL HIGH (ref 20.0–28.0)
O2 Saturation: 92.9 %
Patient temperature: 36.5
pCO2, Ven: 45 mmHg (ref 44–60)
pH, Ven: 7.42 (ref 7.25–7.43)
pO2, Ven: 56 mmHg — ABNORMAL HIGH (ref 32–45)

## 2024-04-01 LAB — GLUCOSE, CAPILLARY
Glucose-Capillary: 446 mg/dL — ABNORMAL HIGH (ref 70–99)
Glucose-Capillary: 387 mg/dL — ABNORMAL HIGH (ref 70–99)
Glucose-Capillary: 452 mg/dL — ABNORMAL HIGH (ref 70–99)
Glucose-Capillary: 294 mg/dL — ABNORMAL HIGH (ref 70–99)
Glucose-Capillary: 183 mg/dL — ABNORMAL HIGH (ref 70–99)
Glucose-Capillary: 215 mg/dL — ABNORMAL HIGH (ref 70–99)

## 2024-04-01 LAB — I-STAT CG4 LACTIC ACID, ED
Lactic Acid, Venous: 2.1 mmol/L (ref 0.5–1.9)
Lactic Acid, Venous: 1.1 mmol/L (ref 0.5–1.9)

## 2024-04-01 LAB — CBC
HCT: 37.5 % (ref 36.0–46.0)
Hemoglobin: 13.1 g/dL (ref 12.0–15.0)
MCH: 32.1 pg (ref 26.0–34.0)
MCHC: 34.9 g/dL (ref 30.0–36.0)
MCV: 91.9 fL (ref 80.0–100.0)
Platelets: 175 K/uL (ref 150–400)
RBC: 4.08 MIL/uL (ref 3.87–5.11)
RDW: 12.4 % (ref 11.5–15.5)
WBC: 12.2 K/uL — ABNORMAL HIGH (ref 4.0–10.5)
nRBC: 0 % (ref 0.0–0.2)

## 2024-04-01 LAB — TYPE AND SCREEN
ABO/RH(D): O POS
Antibody Screen: NEGATIVE

## 2024-04-01 LAB — PHOSPHORUS: Phosphorus: 3.1 mg/dL (ref 2.5–4.6)

## 2024-04-01 LAB — TSH: TSH: 3.02 u[IU]/mL (ref 0.350–4.500)

## 2024-04-01 LAB — HIV ANTIBODY (ROUTINE TESTING W REFLEX): HIV Screen 4th Generation wRfx: NONREACTIVE

## 2024-04-01 LAB — OSMOLALITY: Osmolality: 303 mosm/kg — ABNORMAL HIGH (ref 275–295)

## 2024-04-01 LAB — HEMOGLOBIN A1C
Hgb A1c MFr Bld: 12.7 % — ABNORMAL HIGH (ref 4.8–5.6)
Mean Plasma Glucose: 317.79 mg/dL

## 2024-04-01 LAB — LIPASE, BLOOD: Lipase: 25 U/L (ref 11–51)

## 2024-04-01 LAB — CBG MONITORING, ED: Glucose-Capillary: 448 mg/dL — ABNORMAL HIGH (ref 70–99)

## 2024-04-01 LAB — CORTISOL: Cortisol, Plasma: 11.5 ug/dL

## 2024-04-01 LAB — BETA-HYDROXYBUTYRIC ACID: Beta-Hydroxybutyric Acid: 0.8 mmol/L — ABNORMAL HIGH (ref 0.05–0.27)

## 2024-04-01 LAB — POC OCCULT BLOOD, ED: Fecal Occult Bld: NEGATIVE

## 2024-04-01 LAB — TROPONIN I (HIGH SENSITIVITY)
Troponin I (High Sensitivity): 6 ng/L (ref <18)
Troponin I (High Sensitivity): 7 ng/L (ref <18)

## 2024-04-01 LAB — HEMOGLOBIN AND HEMATOCRIT, BLOOD
HCT: 36.7 % (ref 36.0–46.0)
Hemoglobin: 12.9 g/dL (ref 12.0–15.0)

## 2024-04-01 LAB — AMYLASE: Amylase: 17 U/L — ABNORMAL LOW (ref 28–100)

## 2024-04-01 LAB — MRSA NEXT GEN BY PCR, NASAL: MRSA by PCR Next Gen: NOT DETECTED

## 2024-04-01 MED ORDER — POTASSIUM CHLORIDE 20 MEQ PO PACK
60.0000 meq | PACK | Freq: Once | ORAL | Status: AC
  Administered 2024-04-02: 60 meq via ORAL
  Filled 2024-04-01: qty 3

## 2024-04-01 MED ORDER — LACTATED RINGERS IV BOLUS
1000.0000 mL | Freq: Once | INTRAVENOUS | Status: AC
  Administered 2024-04-01: 1000 mL via INTRAVENOUS

## 2024-04-01 MED ORDER — HYDROMORPHONE HCL 2 MG PO TABS
2.0000 mg | ORAL_TABLET | ORAL | Status: DC | PRN
  Administered 2024-04-02 – 2024-04-04 (×3): 2 mg via ORAL
  Filled 2024-04-01 (×4): qty 1

## 2024-04-01 MED ORDER — FENOFIBRATE 54 MG PO TABS
54.0000 mg | ORAL_TABLET | Freq: Every day | ORAL | Status: DC
  Administered 2024-04-02 – 2024-04-07 (×6): 54 mg via ORAL
  Filled 2024-04-01 (×6): qty 1

## 2024-04-01 MED ORDER — SODIUM CHLORIDE 0.9 % IV SOLN
2.0000 g | INTRAVENOUS | Status: DC
  Administered 2024-04-01: 2 g via INTRAVENOUS
  Filled 2024-04-01: qty 20

## 2024-04-01 MED ORDER — POTASSIUM CHLORIDE 10 MEQ/100ML IV SOLN
10.0000 meq | INTRAVENOUS | Status: DC
  Administered 2024-04-01: 10 meq via INTRAVENOUS
  Filled 2024-04-01: qty 100

## 2024-04-01 MED ORDER — OXYCODONE HCL 5 MG PO TABS
15.0000 mg | ORAL_TABLET | Freq: Three times a day (TID) | ORAL | Status: DC
  Administered 2024-04-02 – 2024-04-07 (×20): 15 mg via ORAL
  Filled 2024-04-01 (×21): qty 3

## 2024-04-01 MED ORDER — LACTATED RINGERS IV SOLN: INTRAVENOUS | Status: DC

## 2024-04-01 MED ORDER — PANTOPRAZOLE SODIUM 40 MG IV SOLR
40.0000 mg | Freq: Once | INTRAVENOUS | Status: AC
  Administered 2024-04-01: 40 mg via INTRAVENOUS
  Filled 2024-04-01: qty 10

## 2024-04-01 MED ORDER — GABAPENTIN 100 MG PO CAPS
100.0000 mg | ORAL_CAPSULE | Freq: Every day | ORAL | Status: DC
Start: 2024-04-02 — End: 2024-04-07
  Administered 2024-04-02 – 2024-04-07 (×6): 100 mg via ORAL
  Filled 2024-04-01 (×6): qty 1

## 2024-04-01 MED ORDER — ONDANSETRON HCL 4 MG/2ML IJ SOLN
4.0000 mg | Freq: Once | INTRAMUSCULAR | Status: AC
  Administered 2024-04-01: 4 mg via INTRAVENOUS
  Filled 2024-04-01: qty 2

## 2024-04-01 MED ORDER — DOCUSATE SODIUM 100 MG PO CAPS: 100.0000 mg | ORAL_CAPSULE | Freq: Two times a day (BID) | ORAL | Status: DC | PRN

## 2024-04-01 MED ORDER — ONDANSETRON HCL 4 MG/2ML IJ SOLN
4.0000 mg | Freq: Four times a day (QID) | INTRAMUSCULAR | Status: DC | PRN
  Administered 2024-04-01: 4 mg via INTRAVENOUS
  Filled 2024-04-01: qty 2

## 2024-04-01 MED ORDER — IOHEXOL 350 MG/ML SOLN
75.0000 mL | Freq: Once | INTRAVENOUS | Status: AC | PRN
  Administered 2024-04-01: 75 mL via INTRAVENOUS

## 2024-04-01 MED ORDER — ATORVASTATIN CALCIUM 80 MG PO TABS
80.0000 mg | ORAL_TABLET | Freq: Every day | ORAL | Status: DC
  Administered 2024-04-02 – 2024-04-07 (×6): 80 mg via ORAL
  Filled 2024-04-01: qty 1
  Filled 2024-04-01: qty 2
  Filled 2024-04-01 (×3): qty 1
  Filled 2024-04-01: qty 2

## 2024-04-01 MED ORDER — POLYETHYLENE GLYCOL 3350 17 G PO PACK
17.0000 g | PACK | Freq: Every day | ORAL | Status: DC | PRN
  Filled 2024-04-01: qty 1

## 2024-04-01 MED ORDER — INSULIN REGULAR(HUMAN) IN NACL 100-0.9 UT/100ML-% IV SOLN
INTRAVENOUS | Status: AC
  Administered 2024-04-01: 12 [IU]/h via INTRAVENOUS
  Filled 2024-04-01: qty 100

## 2024-04-01 MED ORDER — CHLORHEXIDINE GLUCONATE CLOTH 2 % EX PADS
6.0000 | MEDICATED_PAD | Freq: Every day | CUTANEOUS | Status: DC
  Administered 2024-04-01 – 2024-04-07 (×7): 6 via TOPICAL

## 2024-04-01 MED ORDER — NOREPINEPHRINE 4 MG/250ML-% IV SOLN
0.0000 ug/min | INTRAVENOUS | Status: DC
  Administered 2024-04-01: 2.5 ug/min via INTRAVENOUS
  Administered 2024-04-03: 2 ug/min via INTRAVENOUS
  Filled 2024-04-01 (×2): qty 250

## 2024-04-01 MED ORDER — BUDESONIDE 0.5 MG/2ML IN SUSP
0.5000 mg | Freq: Two times a day (BID) | RESPIRATORY_TRACT | Status: DC
  Administered 2024-04-01 – 2024-04-07 (×10): 0.5 mg via RESPIRATORY_TRACT
  Filled 2024-04-01 (×11): qty 2

## 2024-04-01 MED ORDER — REVEFENACIN 175 MCG/3ML IN SOLN
175.0000 ug | Freq: Every day | RESPIRATORY_TRACT | Status: DC
  Administered 2024-04-01 – 2024-04-07 (×5): 175 ug via RESPIRATORY_TRACT
  Filled 2024-04-01 (×6): qty 3

## 2024-04-01 MED ORDER — ARFORMOTEROL TARTRATE 15 MCG/2ML IN NEBU
15.0000 ug | INHALATION_SOLUTION | Freq: Two times a day (BID) | RESPIRATORY_TRACT | Status: DC
  Administered 2024-04-01 – 2024-04-07 (×10): 15 ug via RESPIRATORY_TRACT
  Filled 2024-04-01 (×11): qty 2

## 2024-04-01 MED ORDER — CLOPIDOGREL BISULFATE 75 MG PO TABS
75.0000 mg | ORAL_TABLET | Freq: Every day | ORAL | Status: DC
  Administered 2024-04-02 – 2024-04-07 (×6): 75 mg via ORAL
  Filled 2024-04-01 (×6): qty 1

## 2024-04-01 MED ORDER — DEXTROSE IN LACTATED RINGERS 5 % IV SOLN: INTRAVENOUS | Status: DC

## 2024-04-01 MED ORDER — HEPARIN SODIUM (PORCINE) 5000 UNIT/ML IJ SOLN
5000.0000 [IU] | Freq: Three times a day (TID) | INTRAMUSCULAR | Status: DC
  Administered 2024-04-01 – 2024-04-02 (×2): 5000 [IU] via SUBCUTANEOUS
  Filled 2024-04-01 (×2): qty 1

## 2024-04-01 MED ORDER — DEXTROSE 50 % IV SOLN: 0.0000 mL | INTRAVENOUS | Status: DC | PRN

## 2024-04-01 NOTE — ED Triage Notes (Signed)
 Pt was bib [facility] from [facility] across the street for a syncopal episode. Heart care laid her down and took a BP and it was 78/58. Pt stated she felt dizzy and weak CBG 515. Pt reports not taking insulin  today and doesn't know last time BP 70/40 HR 70 O2 2L [place] RR 20 500 NS with no change  [facility] reports pt being lethargic Responds to verbal.

## 2024-04-01 NOTE — ED Provider Notes (Signed)
 [facility] 25M MEDICAL ICU Provider Note  CSN: [id number] Arrival date & time: 04/01/24 1620  Chief Complaint(s) Syncope  HPI Sara Hobbs is a 63 y.o. female with PMH CAD status post CABG with PEA arrest on 3/17, T2DM, ischemic cardiomyopathy, HTN, HLD, polycythemia who presents emerged department for evaluation of a syncopal episode.  Patient recently seen on 724 after a fall where she suffered a right fifth metatarsal fracture.  Went to follow-up with her doctor today when she felt very unsteady on her feet and had to lay down.  Reportedly had a syncopal episode with EMS in the stretcher.  Patient arrives very hypotensive with systolics in the 70s but vital signs otherwise unremarkable.  She endorses pain in the toe but denies chest pain, shortness of breath, headache, fever or other systemic symptoms.  Of note she states that she has been vomiting yesterday and has been taking a copious amount of Pepto-Bismol.   Past Medical History Past Medical History:  Diagnosis Date   Angina pectoris (HCC)    Anxiety    Arthritis    hands, neck, back, knees, hips, ankles (11/22/2014)   Arthritis    Asthma    CAD (coronary artery disease)    a. 09/2014: LIMA to LAD, SVG to PDA, EVH via right thigh b. Cardiac Arrest 11/2015: patent LIMA-LAD, CTO if SVG-PDA, 99% stenosis of Prox-Mid Cx w/ DES placed // Myoview  7/22 EF 71, inf infarct with peri-infarct ischemia; intermediate risk   CAD, multiple vessel    Cardiac arrest (HCC) 11/25/2015   Chest tightness    CHF (congestive heart failure) (HCC)    Echocardiogram 7/22: EF 55-60, no RWMA, mod LVH, Gr 1 DD, normal RVSF, trivial MR   Chronic lower back pain    Chronic shoulder pain    right; I've got bad rotator cuff   COPD (chronic obstructive pulmonary disease) (HCC)    Coronary artery disease    Depression    Diabetes mellitus with complication (HCC)    Diabetes mellitus without complication (HCC)    Essential hypertension  05/20/2018   GERD (gastroesophageal reflux disease)    Headache    monthly (11/22/2014)   History of blood transfusion    when I had my 1st youngun; when I had MI   History of hiatal hernia    History of stomach ulcers    Hyperlipidemia    Hypothyroidism    PMH:   IDDM (insulin  dependent diabetes mellitus)    Incidental lung nodule, > 3mm and < 8mm 11/22/2014   Small non-calcified nodules left lung   Myocardial infarction (HCC) 11/2013   PCI w/ drug eluting stent LCx   Neuropathy    Neuropathy due to secondary diabetes [facility])    Wears glasses    Patient Active Problem List   Diagnosis Date Noted   Syncope 04/01/2024   Polycythemia, secondary 03/16/2021   Essential hypertension 05/20/2018   Tobacco abuse 05/20/2018   Chest pain 06/17/2016   Chest pain with moderate risk of acute coronary syndrome 04/21/2016   Uncontrolled insulin  dependent diabetes mellitus    Acute respiratory failure with hypoxia (HCC)    Cardiac arrest (HCC) 11/25/2015   PEA (Pulseless electrical activity) (HCC) 11/25/2015   Respiratory failure (HCC) 11/25/2015   Wound cellulitis 12/13/2014   Wound infection 11/23/2014   Cellulitis of sternum 11/22/2014   Incidental lung nodule, > 3mm and < 8mm 11/22/2014   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   Arthritis    Back  pain, chronic    Chronic shoulder pain    COPD (chronic obstructive pulmonary disease) (HCC)    Diabetes mellitus with complication (HCC)    Coronary artery disease involving native coronary artery of native heart with angina pectoris (HCC)    Hyperlipidemia LDL goal <70    GERD (gastroesophageal reflux disease)    Chest tightness    Angina pectoris (HCC)    H/O heart artery stent 11/01/2013   Home Medication(s) Prior to Admission medications   Medication Sig Start Date End Date Taking? Authorizing Provider  albuterol  (PROVENTIL  HFA;VENTOLIN  HFA) 108 (90 BASE) MCG/ACT inhaler Inhale 2 puffs into the lungs every 6 (six) hours as needed for wheezing  or shortness of breath.    [provider]  atorvastatin  (LIPITOR ) 80 MG tablet Take 1 tablet (80 mg total) by mouth daily. 01/10/24   Wonda Sharper, MD  carvedilol  (COREG ) 3.125 MG tablet Take 1 tablet (3.125 mg total) by mouth 2 (two) times daily with a meal. 01/10/24   Wonda Sharper, MD  Cholecalciferol (VITAMIN D3) 1.25 MG (50000 UT) CAPS Take 1 capsule by mouth daily. 01/31/21   [provider]  clopidogrel  (PLAVIX ) 75 MG tablet Take 1 tablet (75 mg total) by mouth daily. 01/10/24   Wonda Sharper, MD  dapagliflozin propanediol (FARXIGA) 10 MG TABS tablet Take 10 mg by mouth daily. 01/22/20   [provider]  diphenhydrAMINE-APAP, sleep, (TYLENOL  PM EXTRA STRENGTH PO) Take by mouth at bedtime as needed.    [provider]  fenofibrate  54 MG tablet Take 54 mg by mouth daily.    [provider]  Fluticasone-Salmeterol (ADVAIR) 100-50 MCG/DOSE AEPB Inhale 1 puff into the lungs 2 (two) times daily as needed (shortness of breath).     [provider]  gabapentin  (NEURONTIN ) 100 MG capsule Take 100 mg by mouth daily with breakfast. 04/15/20   [provider]  insulin  aspart (NOVOLOG ) 100 UNIT/ML FlexPen Inject 0-5 Units into the skin daily. Based on sliding scale    [provider]  Insulin  Glargine-yfgn 100 UNIT/ML SOPN Take 40 Units by mouth 2 (two) times daily.    [provider]  isosorbide  mononitrate (IMDUR ) 60 MG 24 hr tablet Take 1 tablet (60 mg total) by mouth daily. 01/10/24   Wonda Sharper, MD  lisinopril  (ZESTRIL ) 2.5 MG tablet Take 1 tablet (2.5 mg total) by mouth daily. 01/10/24   Cooper, Michael, MD  meclizine (ANTIVERT) 25 MG tablet Take 25 mg by mouth at bedtime.    [provider]  methocarbamol (ROBAXIN) 750 MG tablet SMARTSIG:1 Tablet(s) By Mouth Every 12 Hours 07/14/21   [provider]  nitroGLYCERIN  (NITROSTAT ) 0.4 MG SL tablet DISSOLVE ONE TABLET UNDER THE TONGUE EVERY 5 MINUTES AS  NEEDED FOR CHEST PAIN.  DO NOT EXCEED A TOTAL OF 3 DOSES IN 15 MINUTES NOW 04/17/23   Wonda Sharper, MD  ondansetron  (ZOFRAN ) 8 MG tablet Take 8 mg by mouth every 8 (eight) hours as needed. 06/08/21   [provider]  oxycodone  (ROXICODONE ) 30 MG immediate release tablet Take 15 mg by mouth 4 (four) times daily. 07/10/21   [provider]  OXYCODONE  ER PO Take 1 tablet by mouth every 8 (eight) hours as needed (PAIN).     [provider]  promethazine  (PHENERGAN ) 12.5 MG tablet Take 12.5 mg by mouth every 8 (eight) hours as needed. 07/14/21   [provider]  promethazine  (PHENERGAN ) 25 MG tablet Take 1 tablet (25 mg total) by mouth  every 6 (six) hours as needed for nausea or vomiting. 03/26/24   Kammerer, Megan L, DO  tiotropium (SPIRIVA ) 18 MCG inhalation capsule Place 18 mcg into inhaler and inhale daily as needed (shortness of breath).     [provider]                                                                                                                                    Past Surgical History Past Surgical History:  Procedure Laterality Date   ABDOMINAL HYSTERECTOMY  1990's   ABDOMINAL HYSTERECTOMY     APPLICATION OF WOUND VAC N/A 11/23/2014   Procedure: POSSIBLE APPLICATION OF WOUND VAC;  Surgeon: Sudie VEAR Laine, MD;  Location: [facility] OR;  Service: Thoracic;  Laterality: N/A;   BREAST SURGERY Left 1990's   breast duct surgery   CARDIAC CATHETERIZATION  09/01/14   CARDIAC CATHETERIZATION N/A 11/25/2015   Procedure: Left Heart Cath and Cors/Grafts Angiography;  Surgeon: Ozell Fell, MD;  Location: [facility] INVASIVE CV LAB;  Service: Cardiovascular;  Laterality: N/A;   CARDIAC CATHETERIZATION N/A 11/25/2015   Procedure: Coronary Stent Intervention;  Surgeon: Ozell Fell, MD;  Location: [facility] INVASIVE CV LAB;  Service: Cardiovascular;  Laterality: N/A;   CARDIAC CATHETERIZATION N/A 06/19/2016   Procedure: Left Heart Cath and Cors/Grafts  Angiography;  Surgeon: Peter M Swaziland, MD;  Location: [facility] INVASIVE CV LAB;  Service: Cardiovascular;  Laterality: N/A;   CARDIAC SURGERY     CESAREAN SECTION  1982; 1984   CESAREAN SECTION     COLONOSCOPY     CORONARY ANGIOPLASTY WITH STENT PLACEMENT  11/11/2013   Drug-eluting stent in LCx   CORONARY ARTERY BYPASS GRAFT N/A 09/22/2014   Procedure: CORONARY ARTERY BYPASS GRAFTING (CABG);  Surgeon: Sudie VEAR Laine, MD;  Location: [facility] OR;  Service: Open Heart Surgery;  Laterality: N/A;  Times 2 using left internal mammary artery and endoscopically harvested right saphenous vein   ESOPHAGOGASTRODUODENOSCOPY     GASTRIC RESECTION  1990's   bezoar; for reflux   LAPAROSCOPIC CHOLECYSTECTOMY  2000   STERNAL WOUND DEBRIDEMENT N/A 11/23/2014   Procedure: SUPERFICIAL STERNAL WOUND DEBRIDEMENT;  Surgeon: Sudie VEAR Laine, MD;  Location: [facility] OR;  Service: Thoracic;  Laterality: N/A;   TEE WITHOUT CARDIOVERSION N/A 09/22/2014   Procedure: TRANSESOPHAGEAL ECHOCARDIOGRAM (TEE);  Surgeon: Sudie VEAR Laine, MD;  Location: [facility] OR;  Service: Open Heart Surgery;  Laterality: N/A;   Family History Family History  Adopted: Yes  Family history unknown: Yes    Social History Social History   Tobacco Use   Smoking status: Every Day    Current packs/day: 2.00    Average packs/day: 2.0 packs/day for 41.0 years (82.0 ttl pk-yrs)    Types: Cigarettes   Smokeless tobacco: Never  Vaping Use   Vaping status: Never Used  Substance Use Topics   Alcohol use: Not Currently   Drug use: No   Allergies Wellbutrin [bupropion]  Review of Systems Review of Systems  Gastrointestinal:  Positive for nausea and vomiting.  Neurological:  Positive for syncope.    Physical Exam Vital Signs  I have reviewed the triage vital signs BP 125/88   Pulse 83   Temp (!) 96.6 F (35.9 C) (Axillary)   Resp 12   Ht 5' 1 (1.549 m)   Wt 73.5 kg   SpO2 96%   BMI 30.61 kg/m   Physical Exam Vitals and nursing note reviewed.   Constitutional:      General: She is not in acute distress.    Appearance: She is well-developed. She is ill-appearing.  HENT:     Head: Normocephalic and atraumatic.  Eyes:     Conjunctiva/sclera: Conjunctivae normal.  Cardiovascular:     Rate and Rhythm: Normal rate and regular rhythm.     Heart sounds: No murmur heard. Pulmonary:     Effort: Pulmonary effort is normal. No respiratory distress.     Breath sounds: Normal breath sounds.  Abdominal:     Palpations: Abdomen is soft.     Tenderness: There is no abdominal tenderness.  Musculoskeletal:        General: No swelling.     Cervical back: Neck supple.  Skin:    General: Skin is warm and dry.     Capillary Refill: Capillary refill takes less than 2 seconds.  Neurological:     Mental Status: She is alert.  Psychiatric:        Mood and Affect: Mood normal.     ED Results and Treatments Labs (all labs ordered are listed, but only abnormal results are displayed) Labs Reviewed  COMPREHENSIVE METABOLIC PANEL WITH GFR - Abnormal; Notable for the following components:      Result Value   Sodium 134 (*)    Potassium 3.3 (*)    Chloride 97 (*)    Glucose, Bld 472 (*)    Creatinine, Ser 1.04 (*)    Calcium  8.5 (*)    Total Protein 5.1 (*)    Albumin  3.2 (*)    ALT 45 (*)    All other components within normal limits  CBC WITH DIFFERENTIAL/PLATELET - Abnormal; Notable for the following components:   RBC 3.82 (*)    HCT 35.0 (*)    All other components within normal limits  BLOOD GAS, VENOUS - Abnormal; Notable for the following components:   pO2, Ven 56 (*)    Bicarbonate 29.2 (*)    Acid-Base Excess 3.8 (*)    All other components within normal limits  BETA-HYDROXYBUTYRIC ACID - Abnormal; Notable for the following components:   Beta-Hydroxybutyric Acid 0.80 (*)    All other components within normal limits  GLUCOSE, CAPILLARY - Abnormal; Notable for the following components:   Glucose-Capillary 446 (*)    All  other components within normal limits  GLUCOSE, CAPILLARY - Abnormal; Notable for the following components:   Glucose-Capillary 452 (*)    All other components within normal limits  GLUCOSE, CAPILLARY - Abnormal; Notable for the following components:   Glucose-Capillary 387 (*)    All other components within normal limits  GLUCOSE, CAPILLARY - Abnormal; Notable for the following components:   Glucose-Capillary 294 (*)    All other components within normal limits  CBG MONITORING, ED - Abnormal; Notable for the following components:   Glucose-Capillary 448 (*)    All other components within normal limits  I-STAT CG4 LACTIC ACID, ED - Abnormal; Notable for the following components:  Lactic Acid, Venous 2.1 (*)    All other components within normal limits  CULTURE, BLOOD (ROUTINE X 2)  CULTURE, BLOOD (ROUTINE X 2)  URINE CULTURE  MRSA NEXT GEN BY PCR, NASAL  TSH  HEMOGLOBIN AND HEMATOCRIT, BLOOD  URINALYSIS, ROUTINE W REFLEX MICROSCOPIC  HIV ANTIBODY (ROUTINE TESTING W REFLEX)  CBC  AMYLASE  LIPASE, BLOOD  PHOSPHORUS  CORTISOL  CBC  BLOOD GAS, ARTERIAL  MAGNESIUM   PHOSPHORUS  OSMOLALITY  OSMOLALITY, URINE  BASIC METABOLIC PANEL WITH GFR  HEMOGLOBIN A1C  POC OCCULT BLOOD, ED  I-STAT CG4 LACTIC ACID, ED  TYPE AND SCREEN  TROPONIN I (HIGH SENSITIVITY)  TROPONIN I (HIGH SENSITIVITY)                                                                                                                          Radiology CT CHEST ABDOMEN PELVIS W CONTRAST Result Date: 04/01/2024 CLINICAL DATA:  Sepsis. EXAM: CT CHEST, ABDOMEN, AND PELVIS WITH CONTRAST TECHNIQUE: Multidetector CT imaging of the chest, abdomen and pelvis was performed following the standard protocol during bolus administration of intravenous contrast. RADIATION DOSE REDUCTION: This exam was performed according to the departmental dose-optimization program which includes automated exposure control, adjustment of the mA  and/or kV according to patient size and/or use of iterative reconstruction technique. CONTRAST:  75mL OMNIPAQUE  IOHEXOL  350 MG/ML SOLN COMPARISON:  11/22/2014. FINDINGS: CT CHEST FINDINGS Cardiovascular: The heart is normal in size and there is no pericardial effusion. Three-vessel coronary artery calcifications are noted. There is atherosclerotic calcification of the aorta without evidence of aneurysm. The pulmonary trunk is normal in caliber. Mediastinum/Nodes: No mediastinal, hilar, or axillary lymphadenopathy is seen. Coarse calcifications and subcentimeter nodules are noted in the thyroid gland. The trachea and esophagus are within normal limits. Lungs/Pleura: Atelectasis and/or scarring is noted bilaterally. No effusion or pneumothorax is seen. A 3 mm and a 5 mm nodule are present in the left lower lobe, unchanged from 2016. No new nodule is seen. Musculoskeletal: Sternotomy wires are noted. An old healed rib fractures present on the right. No acute osseous abnormality. CT ABDOMEN PELVIS FINDINGS Hepatobiliary: No focal liver abnormality is seen. Fatty infiltration of the liver is noted. Status post cholecystectomy. There is more mild biliary ductal dilation measuring 1.1 cm. Pancreas: Unremarkable. No pancreatic ductal dilatation or surrounding inflammatory changes. Spleen: Normal in size without focal abnormality. Adrenals/Urinary Tract: The adrenal glands are within normal limits. The kidneys enhance symmetrically. No renal calculus or hydronephrosis is seen bilaterally. Subcentimeter hypodensities are present in the left kidney which are too small to further characterize. The bladder is unremarkable. Stomach/Bowel: The stomach is within normal limits. No bowel obstruction, free air, or pneumatosis is seen. A moderate amount of retained stools present in the colon. There is multifocal segmental colonic wall thickening with associated fat stranding, most pronounced at the descending colon. Appendix appears  normal. Vascular/Lymphatic: Aortic atherosclerosis. No enlarged abdominal or pelvic lymph nodes. Reproductive: Status post hysterectomy. No  adnexal masses. Other: No abdominopelvic ascites. Musculoskeletal: No acute osseous abnormality. IMPRESSION: 1. Findings compatible with colitis. No bowel obstruction or free air is seen. 2. Status post cholecystectomy with mild biliary ductal dilatation. The common bile duct measures up to 1.1 cm. 3. Left lower lobe pulmonary nodules measuring up to 5 mm, unchanged from 2016 and likely benign. 4. Coronary artery calcifications. 5. Aortic atherosclerosis. Electronically Signed   By: Leita Birmingham M.D.   On: 04/01/2024 19:20    Pertinent labs & imaging results that were available during my care of the patient were reviewed by me and considered in my medical decision making (see MDM for details).  Medications Ordered in ED Medications  norepinephrine  (LEVOPHED ) 4mg  in (0.016 mg/mL) premix infusion (4 mcg/min Intravenous Infusion Verify 04/01/24 2100)  atorvastatin  (LIPITOR ) tablet 80 mg (has no administration in time range)  fenofibrate  tablet 54 mg (has no administration in time range)  clopidogrel  (PLAVIX ) tablet 75 mg (has no administration in time range)  gabapentin  (NEURONTIN ) capsule 100 mg (has no administration in time range)  Chlorhexidine  Gluconate Cloth 2 % PADS 6 each (has no administration in time range)  docusate sodium  (COLACE) capsule 100 mg (has no administration in time range)  polyethylene glycol (MIRALAX  / GLYCOLAX ) packet 17 g (has no administration in time range)  heparin  injection 5,000 Units (5,000 Units Subcutaneous Given 04/01/24 2156)  arformoterol  (BROVANA ) nebulizer solution 15 mcg (15 mcg Nebulization Given 04/01/24 2133)  budesonide  (PULMICORT ) nebulizer solution 0.5 mg (0.5 mg Nebulization Given 04/01/24 2135)  revefenacin  (YUPELRI ) nebulizer solution 175 mcg (175 mcg Nebulization Given 04/01/24 2137)  insulin  regular, human  (MYXREDLIN ) 100 units/ 100 mL infusion (11 Units/hr Intravenous Rate/Dose Change 04/01/24 2159)  dextrose  50 % solution 0-50 mL (has no administration in time range)  cefTRIAXone  (ROCEPHIN ) 2 g in sodium chloride  0.9 % 100 mL IVPB ( Intravenous Infusion Verify 04/01/24 2100)  potassium chloride  10 mEq in 100 mL IVPB (10 mEq Intravenous New Bag/Given 04/01/24 2157)  lactated ringers  bolus 1,000 mL (1,000 mLs Intravenous New Bag/Given 04/01/24 1659)  lactated ringers  bolus 1,000 mL (0 mLs Intravenous Stopped 04/01/24 1803)  ondansetron  (ZOFRAN ) injection 4 mg (4 mg Intravenous Given 04/01/24 1827)  pantoprazole  (PROTONIX ) injection 40 mg (40 mg Intravenous Given 04/01/24 1825)  lactated ringers  bolus 1,000 mL ( Intravenous Infusion Verify 04/01/24 2100)  iohexol  (OMNIPAQUE ) 350 MG/ML injection 75 mL (75 mLs Intravenous Contrast Given 04/01/24 1853)                                                                                                                                     Procedures .Critical Care  Performed by: Albertina Dixon, MD Authorized by: Albertina Dixon, MD   Critical care provider statement:    Critical care time (minutes):  30   Critical care was necessary to treat or prevent imminent or life-threatening deterioration of the following conditions:  Shock  Critical care was time spent personally by me on the following activities:  Development of treatment plan with patient or surrogate, discussions with consultants, evaluation of patient's response to treatment, examination of patient, ordering and review of laboratory studies, ordering and review of radiographic studies, ordering and performing treatments and interventions, pulse oximetry, re-evaluation of patient's condition and review of old charts   (including critical care time)  Medical Decision Making / ED Course   This patient presents to the ED for concern of syncope, this involves an extensive number of treatment options,  and is a complaint that carries with it a high risk of complications and morbidity.  The differential diagnosis includes orthostatic syncope, cardiogenic syncope, vasovagal syncope, electrolyte abnormality, dehydration, dysrhythmia, vasovagal, Hypoglycemia, Seizure, Autonomic Insufficiency  MDM: Patient seen emerged part for evaluation of syncope.  Physical exam reveals an ill-appearing patient that appears fatigued but cardiopulmonary exam is unremarkable.  Abdominal exam is unremarkable.  Toe fracture without open wound or draining pus.  Patient profoundly hypotensive in the emergency department and despite 2 L of fluid is not having improvement of her hypotension.  Patient started on peripheral Levophed .  Initial laboratory evaluation with no significant leukocytosis, potassium 3.3, glucose 472, creatinine 1.04, albumin  3.2, normal anion gap of 10.  TSH is normal.  Patient had a bowel movement that shows jet black stool but this is Hemoccult negative and do suspect this may be secondary to Pepto-Bismol use.  CT chest and pelvis obtained that shows findings compatible with colitis but is largely otherwise unremarkable.  Will require ICU admission in the setting of profound hypotension requiring peripheral pressors.  Patient admitted   Additional history obtained: -Additional history obtained from daughter -External records from outside source obtained and reviewed including: Chart review including previous notes, labs, imaging, consultation notes   Lab Tests: -I ordered, reviewed, and interpreted labs.   The pertinent results include:   Labs Reviewed  COMPREHENSIVE METABOLIC PANEL WITH GFR - Abnormal; Notable for the following components:      Result Value   Sodium 134 (*)    Potassium 3.3 (*)    Chloride 97 (*)    Glucose, Bld 472 (*)    Creatinine, Ser 1.04 (*)    Calcium  8.5 (*)    Total Protein 5.1 (*)    Albumin  3.2 (*)    ALT 45 (*)    All other components within normal limits  CBC  WITH DIFFERENTIAL/PLATELET - Abnormal; Notable for the following components:   RBC 3.82 (*)    HCT 35.0 (*)    All other components within normal limits  BLOOD GAS, VENOUS - Abnormal; Notable for the following components:   pO2, Ven 56 (*)    Bicarbonate 29.2 (*)    Acid-Base Excess 3.8 (*)    All other components within normal limits  BETA-HYDROXYBUTYRIC ACID - Abnormal; Notable for the following components:   Beta-Hydroxybutyric Acid 0.80 (*)    All other components within normal limits  GLUCOSE, CAPILLARY - Abnormal; Notable for the following components:   Glucose-Capillary 446 (*)    All other components within normal limits  GLUCOSE, CAPILLARY - Abnormal; Notable for the following components:   Glucose-Capillary 452 (*)    All other components within normal limits  GLUCOSE, CAPILLARY - Abnormal; Notable for the following components:   Glucose-Capillary 387 (*)    All other components within normal limits  GLUCOSE, CAPILLARY - Abnormal; Notable for the following components:   Glucose-Capillary 294 (*)  All other components within normal limits  CBG MONITORING, ED - Abnormal; Notable for the following components:   Glucose-Capillary 448 (*)    All other components within normal limits  I-STAT CG4 LACTIC ACID, ED - Abnormal; Notable for the following components:   Lactic Acid, Venous 2.1 (*)    All other components within normal limits  CULTURE, BLOOD (ROUTINE X 2)  CULTURE, BLOOD (ROUTINE X 2)  URINE CULTURE  MRSA NEXT GEN BY PCR, NASAL  TSH  HEMOGLOBIN AND HEMATOCRIT, BLOOD  URINALYSIS, ROUTINE W REFLEX MICROSCOPIC  HIV ANTIBODY (ROUTINE TESTING W REFLEX)  CBC  AMYLASE  LIPASE, BLOOD  PHOSPHORUS  CORTISOL  CBC  BLOOD GAS, ARTERIAL  MAGNESIUM   PHOSPHORUS  OSMOLALITY  OSMOLALITY, URINE  BASIC METABOLIC PANEL WITH GFR  HEMOGLOBIN A1C  POC OCCULT BLOOD, ED  I-STAT CG4 LACTIC ACID, ED  TYPE AND SCREEN  TROPONIN I (HIGH SENSITIVITY)  TROPONIN I (HIGH  SENSITIVITY)     Imaging Studies ordered: I ordered imaging studies including CT chest abdomen pelvis I independently visualized and interpreted imaging. I agree with the radiologist interpretation   Medicines ordered and prescription drug management: Meds ordered this encounter  Medications   lactated ringers  bolus 1,000 mL   norepinephrine  (LEVOPHED ) 4mg  in (0.016 mg/mL) premix infusion   lactated ringers  bolus 1,000 mL   ondansetron  (ZOFRAN ) injection 4 mg   pantoprazole  (PROTONIX ) injection 40 mg   lactated ringers  bolus 1,000 mL   iohexol  (OMNIPAQUE ) 350 MG/ML injection 75 mL   atorvastatin  (LIPITOR ) tablet 80 mg   fenofibrate  tablet 54 mg   clopidogrel  (PLAVIX ) tablet 75 mg   gabapentin  (NEURONTIN ) capsule 100 mg   Chlorhexidine  Gluconate Cloth 2 % PADS 6 each   docusate sodium  (COLACE) capsule 100 mg   polyethylene glycol (MIRALAX  / GLYCOLAX ) packet 17 g   heparin  injection 5,000 Units   arformoterol  (BROVANA ) nebulizer solution 15 mcg   budesonide  (PULMICORT ) nebulizer solution 0.5 mg   revefenacin  (YUPELRI ) nebulizer solution 175 mcg   insulin  regular, human (MYXREDLIN ) 100 units/ 100 mL infusion    EndoTool Goal Range::   140-180    Type of Diabetes:   Type 2    Mode of Therapy:   Hyperglycemia    Start Method:   EndoTool to calculate   DISCONTD: lactated ringers  infusion   DISCONTD: dextrose  5 % in lactated ringers  infusion   dextrose  50 % solution 0-50 mL   cefTRIAXone  (ROCEPHIN ) 2 g in sodium chloride  0.9 % 100 mL IVPB    Antibiotic Indication::   Bacteremia   potassium chloride  10 mEq in 100 mL IVPB    -I have reviewed the patients home medicines and have made adjustments as needed  Critical interventions Fluid resuscitation, pressor support   Cardiac Monitoring: The patient was maintained on a cardiac monitor.  I personally viewed and interpreted the cardiac monitored which showed an underlying rhythm of: NSr  Social Determinants of Health:   Factors impacting patients care include: none   Reevaluation: After the interventions noted above, I reevaluated the patient and found that they have :improved  Co morbidities that complicate the patient evaluation  Past Medical History:  Diagnosis Date   Angina pectoris (HCC)    Anxiety    Arthritis    hands, neck, back, knees, hips, ankles (11/22/2014)   Arthritis    Asthma    CAD (coronary artery disease)    a. 09/2014: LIMA to LAD, SVG to PDA, EVH via right thigh b.  Cardiac Arrest 11/2015: patent LIMA-LAD, CTO if SVG-PDA, 99% stenosis of Prox-Mid Cx w/ DES placed // Myoview  7/22 EF 71, inf infarct with peri-infarct ischemia; intermediate risk   CAD, multiple vessel    Cardiac arrest (HCC) 11/25/2015   Chest tightness    CHF (congestive heart failure) (HCC)    Echocardiogram 7/22: EF 55-60, no RWMA, mod LVH, Gr 1 DD, normal RVSF, trivial MR   Chronic lower back pain    Chronic shoulder pain    right; I've got bad rotator cuff   COPD (chronic obstructive pulmonary disease) (HCC)    Coronary artery disease    Depression    Diabetes mellitus with complication (HCC)    Diabetes mellitus without complication (HCC)    Essential hypertension 05/20/2018   GERD (gastroesophageal reflux disease)    Headache    monthly (11/22/2014)   History of blood transfusion    when I had my 1st youngun; when I had MI   History of hiatal hernia    History of stomach ulcers    Hyperlipidemia    Hypothyroidism    PMH:   IDDM (insulin  dependent diabetes mellitus)    Incidental lung nodule, > 3mm and < 8mm 11/22/2014   Small non-calcified nodules left lung   Myocardial infarction (HCC) 11/2013   PCI w/ drug eluting stent LCx   Neuropathy    Neuropathy due to secondary diabetes [facility])    Wears glasses       Dispostion: I considered admission for this patient, and patient require hospital mission for syncope with hypotension requiring pressor support     Final Clinical  Impression(s) / ED Diagnoses Final diagnoses:  Hypotension, unspecified hypotension type  Syncope, unspecified syncope type     @PCDICTATION @    Albertina Dixon, MD 04/01/24 2214

## 2024-04-01 NOTE — ED Notes (Signed)
 Per Dr.Kommor change Levo to 2mcg.

## 2024-04-01 NOTE — H&P (Signed)
 NAME:  Sara Hobbs, MRN:  [id number], DOB:  [birth year], LOS: 0 ADMISSION DATE:  04/01/2024, CONSULTATION DATE:  7/30 REFERRING MD:  Kommor, CHIEF COMPLAINT:  syncope and shock   History of Present Illness:  63 year old female patient with a complicated medical history including coronary artery disease dating back to 2017 with prior drug-eluting stent to the circumflex.  Ischemic cardiomyopathy with a EF 55 to 60% and grade 1 diastolic dysfunction, as well as trivial MR, tobacco abuse, COPD  Pertinent  Medical History  Copd, tobacco abuse, Major depression w/ psychotic features., CAD, CABG, PEA arrest 3/17, s/p DES to LCX, CM, DM, neuropathy, HTN, HLD, gerd, polycythemia  Recently seen in the emergency room on 7/24 after a fall where she sustained a right fifth metatarsal fracture. Presented to the emergency room via cardiology office where she was scheduled for cardiology follow-up in regards to her coronary artery disease. On arrival the patient was diaphoretic and lethargic.  She had brief loss of consciousness.  Systolic blood pressure in the 60s endorsing shortness of breath but no chest pain.  She was placed in the supine position with mild improvement in her blood pressure room air pulse oximetry 90 did endorse nausea and vomiting the day prior.  She has had no fever, no new cough, denies chest pain, sore throat, sick exposure.  Had some nausea and vomiting yesterday, 7/29 but no abdominal discomfort.  Has chronic back pain, denies dysuria or change in urine smell or appearance.  She does not check her blood glucose, has not been on her insulin  for over a year.  In regards to her fall she recalls that this was after getting lightheaded, she did not trip.  This happens from time to time she reports.  She has had persistent right lower extremity discomfort since the fall  EMS was called initial blood glucose 515, blood pressure on arrival 78/58 heart rate 70s lethargic administered 500  crystalloid with no significant clinical Change subsequently transferred to the emergency room. ER evaluation: Capillary blood glucose of 448 sodium 134 potassium 3.3 bicarbonate 27 creatinine 1.04 white blood cell count 8.9 hemoglobin 12.1 hematocrit 35 platelet count 166, initial troponin of 6, initial lactate 2.1. Received 2 L of lactated Ringer 's with minimal improvement in blood pressure so was started on norepinephrine  of note patient did begin to exhibit dark black stools while in ER. Because of persistent hypotension critical care was asked to admit    Significant Hospital Events: Including procedures, antibiotic start and stop dates in addition to other pertinent events   7/30 presented to the emergency room via cardiology office after brief syncopal episode and found to have systolic blood pressure in the 70s.  Initial glucose 148, normal bicarbonate, hemoglobin 12.1, initial lactate 10.1.  Had had a fall approximately 1 week prior with ongoing right lower extremity pain.  Critical care asked to admit after patient continued to be hypotensive following 2 L of crystalloid  Interim History / Subjective:  63 year old female patient lying in the bed she is currently in the supine position, affect somewhat flat slow to respond mildly lethargic  Objective    Blood pressure (!) 88/50, pulse 61, temperature 97.7 F (36.5 C), temperature source Oral, resp. rate 13, height 5' 1 (1.549 m), weight 73.5 kg, SpO2 99%.       No intake or output data in the 24 hours ending 04/01/24 1812 Filed Weights   04/01/24 1627  Weight: 73.5 kg    Examination: General: This is  a chronically ill appearing 63 year old female she is in the supine position, awake oriented however responses slow.  She denies any acute pain or dyspnea HENT: Normocephalic atraumatic, her mucous membranes are dry tongue is dry and raised, sclera nonicteric, no JVD Lungs: Clear to auscultation without accessory  use Cardiovascular: Regular rate and rhythm no murmur rub or gallop appreciated point-of-care ultrasound demonstrating grossly intact LV function, window was poor Abdomen: Soft, bowel sounds present, no organomegaly denies pain, had dark-colored stool Hemoccult was negative Extremities: She is slightly cool, pulses are palpable, capillary refill is brisk.  No significant edema, trace at best.  She has some bruising over her right anterior knee Neuro: She is awake, oriented, able to provide history, moves all extremities has no focal weakness this however is generally weak GU: Due to void  Resolved problem list   Assessment and Plan   Undifferentiated shock following syncopal episode - Differential diagnosis at this point wide: Consider hypovolemia in the setting of hyperosmolar state from untreated diabetes, pulmonary emboli, however feel like we would have seen more impressive troponin bump, or sepsis at this point source would not be clear  Plan  agree with pan CT scan, particularly to rule out pulmonary emboli but in regards to the abdomen rule out potential pyelonephritis versus even pancreatitis given her nausea vomiting the day prior and ongoing hyperglycemia Panculture Repeat 1 more liter crystalloid Repeat lactic acid Will place her on broad-spectrum empiric antibiotics Obtain formal echocardiogram Hold her home antihypertensives and beta-blockade Obtain venous blood gas  Hyperglycemia.Type 2 diabetes, previously on insulin  however family reports has not taken insulin  in over a year initial blood glucose over 400 Plan Send serum osmolality, urine osmolality beta hydroxybutyric acid, however noted she does not have an anion gap acidosis Sliding scale insulin   History of hypertension, coronary artery disease with prior DES to circumflex, nonischemic cardiomyopathy, and diastolic heart failure with EF 55 to 60% Plan Holding her Coreg , wonder if this is why she does not have  compensatory tachycardia Hold her isosorbide , Zestril  Continue her Plavix  Echocardiogram as mentioned above  History of COPD: Typically on Spiriva  and Advair Plan Will place her on triple combo nebs for now Continue pulse oximetry Supplemental oxygen for sats greater than 90  History of diabetic neuropathy Plan Resume Neurontin  in the morning  History of hyperlipidemia Plan Resume her atorvastatin  and fenofibrate  in the morning  Acute nondisplaced fracture of the fifth right metatarsal Plan Continue with immobilizer PRN apap  Diet/type: NPO w/ oral meds DVT prophylaxis prophylactic heparin   Pressure ulcer(s): N/A GI prophylaxis: H2B Lines: N/A Foley:  N/A Code Status:  full code Last date of multidisciplinary goals of care discussion [pending]  Labs   CBC: Recent Labs  Lab 04/01/24 1636  WBC 8.9  NEUTROABS 6.3  HGB 12.1  HCT 35.0*  MCV 91.6  PLT 166    Basic Metabolic Panel: Recent Labs  Lab 04/01/24 1636  NA 134*  K 3.3*  CL 97*  CO2 27  GLUCOSE 472*  BUN 17  CREATININE 1.04*  CALCIUM  8.5*   GFR: Estimated Creatinine Clearance: 51.4 mL/min (A) (by C-G formula based on SCr of 1.04 mg/dL (H)). Recent Labs  Lab 04/01/24 1636 04/01/24 1708  WBC 8.9  --   LATICACIDVEN  --  2.1*    Liver Function Tests: Recent Labs  Lab 04/01/24 1636  AST 21  ALT 45*  ALKPHOS 81  BILITOT 0.4  PROT 5.1*  ALBUMIN  3.2*   No results  for input(s): LIPASE, AMYLASE in the last 168 hours. No results for input(s): AMMONIA in the last 168 hours.  ABG    Component Value Date/Time   PHART 7.493 (H) 11/26/2015 0420   PCO2ART 24.7 (L) 11/26/2015 0420   PO2ART 173 (H) 11/26/2015 0420   HCO3 18.8 (L) 11/26/2015 0420   TCO2 19.5 11/26/2015 0420   ACIDBASEDEF 4.0 (H) 11/26/2015 0420   O2SAT 99.3 11/26/2015 0420     Coagulation Profile: No results for input(s): INR, PROTIME in the last 168 hours.  Cardiac Enzymes: No results for input(s): CKTOTAL,  CKMB, CKMBINDEX, TROPONINI in the last 168 hours.  HbA1C: Hgb A1c MFr Bld  Date/Time Value Ref Range Status  06/18/2016 12:40 AM 12.5 (H) 4.8 - 5.6 % Final    Comment:    (NOTE)         Pre-diabetes: 5.7 - 6.4         Diabetes: >6.4         Glycemic control for adults with diabetes: <7.0   11/25/2015 05:54 PM 11.8 (H) 4.8 - 5.6 % Final    Comment:    (NOTE)         Pre-diabetes: 5.7 - 6.4         Diabetes: >6.4         Glycemic control for adults with diabetes: <7.0     CBG: Recent Labs  Lab 04/01/24 1626  GLUCAP 448*    Review of Systems:   Review of Systems  Constitutional:  Positive for malaise/fatigue. Negative for fever.  HENT: Negative.    Eyes: Negative.   Respiratory: Negative.    Cardiovascular:  Positive for leg swelling. Negative for chest pain.  Gastrointestinal:  Positive for nausea and vomiting.  Genitourinary:  Positive for flank pain.  Musculoskeletal:  Positive for joint pain.  Skin: Negative.   Neurological:  Positive for dizziness.  Psychiatric/Behavioral: Negative.       Past Medical History:  She,  has a past medical history of Angina pectoris (HCC), Anxiety, Arthritis, Arthritis, Asthma, CAD (coronary artery disease), CAD, multiple vessel, Cardiac arrest (HCC) (11/25/2015), Chest tightness, CHF (congestive heart failure) (HCC), Chronic lower back pain, Chronic shoulder pain, COPD (chronic obstructive pulmonary disease) (HCC), Coronary artery disease, Depression, Diabetes mellitus with complication (HCC), Diabetes mellitus without complication (HCC), Essential hypertension (05/20/2018), GERD (gastroesophageal reflux disease), Headache, History of blood transfusion, History of hiatal hernia, History of stomach ulcers, Hyperlipidemia, Hypothyroidism, IDDM (insulin  dependent diabetes mellitus), Incidental lung nodule, > 3mm and < 8mm (11/22/2014), Myocardial infarction (HCC) (11/2013), Neuropathy, Neuropathy due to secondary diabetes (HCC), and  Wears glasses.   Surgical History:   Past Surgical History:  Procedure Laterality Date   ABDOMINAL HYSTERECTOMY  1990's   ABDOMINAL HYSTERECTOMY     APPLICATION OF WOUND VAC N/A 11/23/2014   Procedure: POSSIBLE APPLICATION OF WOUND VAC;  Surgeon: Sudie VEAR Laine, MD;  Location: [facility] OR;  Service: Thoracic;  Laterality: N/A;   BREAST SURGERY Left 1990's   breast duct surgery   CARDIAC CATHETERIZATION  09/01/14   CARDIAC CATHETERIZATION N/A 11/25/2015   Procedure: Left Heart Cath and Cors/Grafts Angiography;  Surgeon: Ozell Fell, MD;  [facility] INVASIVE CV LAB;  Service: Cardiovascular;  Laterality: N/A;   CARDIAC CATHETERIZATION N/A 11/25/2015   Procedure: Coronary Stent Intervention;  Surgeon: Ozell Fell, MD;  Location: [facility] INVASIVE CV LAB;  Service: Cardiovascular;  Laterality: N/A;   CARDIAC CATHETERIZATION N/A 06/19/2016   Procedure: Left Heart Cath and Cors/Grafts Angiography;  Surgeon:  Pamalee Marcoe M [place], MD;  Location: [facility] INVASIVE CV LAB;  Service: Cardiovascular;  Laterality: N/A;   CARDIAC SURGERY     CESAREAN SECTION  1982; 1984   CESAREAN SECTION     COLONOSCOPY     CORONARY ANGIOPLASTY WITH STENT PLACEMENT  11/11/2013   Drug-eluting stent in LCx   CORONARY ARTERY BYPASS GRAFT N/A 09/22/2014   Procedure: CORONARY ARTERY BYPASS GRAFTING (CABG);  Surgeon: Sudie VEAR Laine, MD;  Location: [facility] OR;  Service: Open Heart Surgery;  Laterality: N/A;  Times 2 using left internal mammary artery and endoscopically harvested right saphenous vein   ESOPHAGOGASTRODUODENOSCOPY     GASTRIC RESECTION  1990's   bezoar; for reflux   LAPAROSCOPIC CHOLECYSTECTOMY  2000   STERNAL WOUND DEBRIDEMENT N/A 11/23/2014   Procedure: SUPERFICIAL STERNAL WOUND DEBRIDEMENT;  Surgeon: Sudie VEAR Laine, MD;  Location: [facility] OR;  Service: Thoracic;  Laterality: N/A;   TEE WITHOUT CARDIOVERSION N/A 09/22/2014   Procedure: TRANSESOPHAGEAL ECHOCARDIOGRAM (TEE);  Surgeon: Sudie VEAR Laine, MD;  Location: [facility] OR;  Service:  Open Heart Surgery;  Laterality: N/A;     Social History:   reports that she has been smoking cigarettes. She has a 82 pack-year smoking history. She has never used smokeless tobacco. She reports that she does not currently use alcohol. She reports that she does not use drugs.   Family History:  Her She was adopted. Family history is unknown by patient.   Allergies Allergies  Allergen Reactions   Wellbutrin [Bupropion] Other (See Comments) and Palpitations    Makes me feel like im having a heart attack SEVERE CHEST PAIN     Home Medications  Prior to Admission medications   Medication Sig Start Date End Date Taking? Authorizing Provider  albuterol  (PROVENTIL  HFA;VENTOLIN  HFA) 108 (90 BASE) MCG/ACT inhaler Inhale 2 puffs into the lungs every 6 (six) hours as needed for wheezing or shortness of breath.    [provider]  atorvastatin  (LIPITOR ) 80 MG tablet Take 1 tablet (80 mg total) by mouth daily. 01/10/24   Wonda Sharper, MD  carvedilol  (COREG ) 3.125 MG tablet Take 1 tablet (3.125 mg total) by mouth 2 (two) times daily with a meal. 01/10/24   Wonda Sharper, MD  Cholecalciferol (VITAMIN D3) 1.25 MG (50000 UT) CAPS Take 1 capsule by mouth daily. 01/31/21   [provider]  clopidogrel  (PLAVIX ) 75 MG tablet Take 1 tablet (75 mg total) by mouth daily. 01/10/24   Wonda Sharper, MD  dapagliflozin propanediol (FARXIGA) 10 MG TABS tablet Take 10 mg by mouth daily. 01/22/20   [provider]  diphenhydrAMINE-APAP, sleep, (TYLENOL  PM EXTRA STRENGTH PO) Take by mouth at bedtime as needed.    [provider]  fenofibrate  54 MG tablet Take 54 mg by mouth daily.    [provider]  Fluticasone-Salmeterol (ADVAIR) 100-50 MCG/DOSE AEPB Inhale 1 puff into the lungs 2 (two) times daily as needed (shortness of breath).     [provider]  gabapentin  (NEURONTIN ) 100 MG capsule Take 100 mg by mouth daily with breakfast. 04/15/20   [provider]  insulin  aspart (NOVOLOG ) 100 UNIT/ML FlexPen Inject 0-5 Units into the skin daily. Based on sliding scale    [provider]  Insulin  Glargine-yfgn 100 UNIT/ML SOPN Take 40 Units by mouth 2 (two) times daily.    [provider]  isosorbide  mononitrate (IMDUR ) 60 MG 24 hr tablet Take 1 tablet (60 mg total) by mouth daily. 01/10/24   Wonda Sharper, MD  lisinopril  (ZESTRIL ) 2.5 MG tablet Take 1 tablet (2.5 mg total) by mouth daily. 01/10/24   Cooper, Michael, MD  meclizine (ANTIVERT) 25 MG tablet Take 25 mg by mouth at bedtime.    [provider]  methocarbamol (ROBAXIN) 750 MG tablet SMARTSIG:1 Tablet(s) By Mouth Every 12 Hours 07/14/21   [provider]  nitroGLYCERIN  (NITROSTAT ) 0.4 MG SL tablet DISSOLVE ONE TABLET UNDER THE TONGUE EVERY 5 MINUTES AS NEEDED FOR CHEST PAIN.  DO NOT EXCEED A TOTAL OF 3 DOSES IN 15 MINUTES NOW 04/17/23   Wonda Sharper, MD  ondansetron  (ZOFRAN ) 8 MG tablet Take 8 mg by mouth every 8 (eight) hours as needed. 06/08/21   [provider]  oxycodone  (ROXICODONE ) 30 MG immediate release tablet Take 15 mg by mouth 4 (four) times daily. 07/10/21   [provider]  OXYCODONE  ER PO Take 1 tablet by mouth every 8 (eight) hours as needed (PAIN).     [provider]  promethazine  (PHENERGAN ) 12.5 MG tablet Take 12.5 mg by mouth every 8 (eight) hours as needed. 07/14/21   [provider]  promethazine  (PHENERGAN ) 25 MG tablet Take 1 tablet (25 mg total) by mouth every 6 (six) hours as needed for nausea or vomiting. 03/26/24   Kammerer, Megan L, DO  tiotropium (SPIRIVA ) 18 MCG inhalation capsule Place 18 mcg into inhaler and inhale daily as needed (shortness of breath).     [provider]     Critical care time: 55 minutes

## 2024-04-01 NOTE — Progress Notes (Signed)
 OFFICE NOTE:    Date:  04/01/2024  ID:  Sara Hobbs, DOB 1961-05-16, MRN [id number] PCP: Dsa, Joylin G, MD  [facility] Providers Cardiologist:  Ozell Fell, MD Cardiology APP:  Lelon Glendia DASEN, PA-C        Coronary artery disease S/p CABG S/p PEA arrest 3/17 >> s/p DES to LCx Myoview  8/17: inf scar with mod peri-infarct ischemia; EF 49 Cath 10/17: L-LAD patent, S-PDA chronic 100, LCx stent patent >> Med Rx Myoview  7/22: inf infarct w mild peri-infarct ischemia, EF 71 >> Med Rx  MPI 04/12/2023: Inferior scar, no ischemia, EF 50; intermediate risk>> med Rx Ischemic CM Echocardiogram 3/17: EF 45-50, mild MR, sept and post-lat HK Echocardiogram 7/22: EF 55-60, Gr 1 DD, trivial MR Carotid US  08/09/2020: Bilateral ICA 1-39 COPD Diabetes mellitus Peripheral neuropathy Hypertension Hyperlipidemia Tobacco use GERD Polycythemia        Discussed the use of AI scribe software for clinical note transcription with the patient, who gave verbal consent to proceed. History of Present Illness Sara Hobbs is a 63 y.o. female returns for follow-up of CAD.  She was last seen by Dr. Fell in July 2024.  She continued to note chest discomfort (mixed cardiac/noncardiac).  Nuclear stress test was obtained and demonstrated inferior scar but no ischemia.  Medical therapy was recommended.  Of note, she fell recently and was seen in the emergency room 03/26/2024 with right fifth metatarsal fracture.  The patient became diaphoretic and lethargic while getting roomed. We could not get a BP at first. Ultimately it was 60s systolic. I was called to see her. She felt short of breath but denied chest pain. She did lose consciousness briefly. We started O2 and got her to lay down. BP came up into the 70s. O2 sats were 90 on RA and improved to 95 on 2L. EKG did not show ST elevation. She notes some nausea and vomiting yesterday. We called EMS and she will be transported to the ED emergently.      ROS-See HPI    Studies Reviewed:  EKG Interpretation Date/Time:  Wednesday April 01 2024 15:39:33 EDT Ventricular Rate:  68 PR Interval:  142 QRS Duration:  100 QT Interval:  404 QTC Calculation: 429 R Axis:   6  Text Interpretation: Normal sinus rhythm Inferior infarct (cited on or before 21-Mar-2021) Cannot rule out Anterior infarct , age undetermined T wave abnormality, consider lateral ischemia Confirmed by Lelon Glendia [phone]) on 04/01/2024 3:49:35 PM           Physical Exam:  VS:  BP (!) 78/58   Pulse 68        Wt Readings from Last 3 Encounters:  03/26/24 160 lb (72.6 kg)  03/28/23 185 lb (83.9 kg)  03/07/22 197 lb 9.6 oz (89.6 kg)    Constitutional:      Appearance: Acutely ill-appearing.  Pulmonary:     Breath sounds: No rales.  Cardiovascular:     Regular rhythm.  Neurological:     Mental Status: Lethargic.       Assessment and Plan:    Assessment & Plan Syncope and collapse Hypotension, unspecified hypotension type As noted, pt presented for follow up today and developed symptomatic hypotension while getting roomed. She had a brief syncopal episode. EKG did not show STEMI. She was transported emergently to the ED via EMS for evaluation.      Dispo:  Return for Follow up After Dischage from Hospital.  Signed, Glendia Lelon, PA-C

## 2024-04-01 NOTE — Progress Notes (Addendum)
 eLink Physician-Brief Progress Note Patient Name: Sara Hobbs DOB: [birth year] MRN: [id number]   Date of Service  04/01/2024  HPI/Events of Note  63 year old woman history of LVH, diabetes noncompliant to insulin  presents with syncope versus presyncope of cardiology office with hypotension now on vasopressors.   Vital signs reviewed.  Mild electrolyte disturbances on metabolic panel.  Cultures collected.  CT findings consistent with colitis.  eICU Interventions  Maintain antibiotics-ceftriaxone   Utilize norepinephrine  as needed to maintain MAP greater than 65  Insulin  drip, will eventually transition to subcutaneous.  Discontinue LR/D5 LR,  insulin  protocol for hyperglycemia, not DKA/HHS  DVT prophylaxis with heparin  GI prophylaxis not indicated   2217 -complaining of nausea and pain, nothing ordered.  Home oxycodone  is not ordered as well.  She feels that the home oxycodone  is insufficient.  Maintain home oxycodone .  Add Dilaudid  p.o. for breakthrough pain.   2311 unable to tolerate IV potassium.-Advance diet n.p.o. except meds.  Switch potassium to p.o.  Intervention Category Evaluation Type: New Patient Evaluation  Sara Hobbs 04/01/2024, 8:44 PM

## 2024-04-02 ENCOUNTER — Inpatient Hospital Stay (HOSPITAL_COMMUNITY)

## 2024-04-02 DIAGNOSIS — I959 Hypotension, unspecified: Secondary | ICD-10-CM | POA: Diagnosis not present

## 2024-04-02 DIAGNOSIS — R578 Other shock: Secondary | ICD-10-CM

## 2024-04-02 DIAGNOSIS — M7989 Other specified soft tissue disorders: Secondary | ICD-10-CM

## 2024-04-02 DIAGNOSIS — E1165 Type 2 diabetes mellitus with hyperglycemia: Secondary | ICD-10-CM | POA: Diagnosis not present

## 2024-04-02 DIAGNOSIS — I340 Nonrheumatic mitral (valve) insufficiency: Secondary | ICD-10-CM | POA: Diagnosis not present

## 2024-04-02 DIAGNOSIS — R55 Syncope and collapse: Secondary | ICD-10-CM | POA: Diagnosis not present

## 2024-04-02 LAB — POCT I-STAT 7, (LYTES, BLD GAS, ICA,H+H)
Acid-Base Excess: 2 mmol/L (ref 0.0–2.0)
Bicarbonate: 26.7 mmol/L (ref 20.0–28.0)
Calcium, Ion: 1.23 mmol/L (ref 1.15–1.40)
HCT: 36 % (ref 36.0–46.0)
Hemoglobin: 12.2 g/dL (ref 12.0–15.0)
O2 Saturation: 98 %
Potassium: 3.3 mmol/L — ABNORMAL LOW (ref 3.5–5.1)
Sodium: 142 mmol/L (ref 135–145)
TCO2: 28 mmol/L (ref 22–32)
pCO2 arterial: 41 mmHg (ref 32–48)
pH, Arterial: 7.421 (ref 7.35–7.45)
pO2, Arterial: 97 mmHg (ref 83–108)

## 2024-04-02 LAB — ECHOCARDIOGRAM COMPLETE
Area-P 1/2: 4.15 cm2
Height: 61 in
S' Lateral: 3.21 cm
Weight: 2610.25 [oz_av]

## 2024-04-02 LAB — URINALYSIS, ROUTINE W REFLEX MICROSCOPIC
Bacteria, UA: NONE SEEN
Bilirubin Urine: NEGATIVE
Glucose, UA: >=500 mg/dL — AB
Hgb urine dipstick: NEGATIVE
Ketones, ur: NEGATIVE mg/dL
Leukocytes,Ua: NEGATIVE
Nitrite: NEGATIVE
Protein, ur: NEGATIVE mg/dL
Specific Gravity, Urine: 1.02 (ref 1.005–1.030)
pH: 6 (ref 5.0–8.0)

## 2024-04-02 LAB — GLUCOSE, CAPILLARY
Glucose-Capillary: 105 mg/dL — ABNORMAL HIGH (ref 70–99)
Glucose-Capillary: 111 mg/dL — ABNORMAL HIGH (ref 70–99)
Glucose-Capillary: 124 mg/dL — ABNORMAL HIGH (ref 70–99)
Glucose-Capillary: 127 mg/dL — ABNORMAL HIGH (ref 70–99)
Glucose-Capillary: 130 mg/dL — ABNORMAL HIGH (ref 70–99)
Glucose-Capillary: 136 mg/dL — ABNORMAL HIGH (ref 70–99)
Glucose-Capillary: 145 mg/dL — ABNORMAL HIGH (ref 70–99)
Glucose-Capillary: 152 mg/dL — ABNORMAL HIGH (ref 70–99)
Glucose-Capillary: 152 mg/dL — ABNORMAL HIGH (ref 70–99)
Glucose-Capillary: 234 mg/dL — ABNORMAL HIGH (ref 70–99)
Glucose-Capillary: 240 mg/dL — ABNORMAL HIGH (ref 70–99)
Glucose-Capillary: 271 mg/dL — ABNORMAL HIGH (ref 70–99)
Glucose-Capillary: 83 mg/dL (ref 70–99)

## 2024-04-02 LAB — BASIC METABOLIC PANEL WITH GFR
Anion gap: 7 (ref 5–15)
BUN: 11 mg/dL (ref 8–23)
CO2: 27 mmol/L (ref 22–32)
Calcium: 8.8 mg/dL — ABNORMAL LOW (ref 8.9–10.3)
Chloride: 107 mmol/L (ref 98–111)
Creatinine, Ser: 0.61 mg/dL (ref 0.44–1.00)
GFR, Estimated: >60 mL/min (ref >60)
Glucose, Bld: 123 mg/dL — ABNORMAL HIGH (ref 70–99)
Potassium: 4 mmol/L (ref 3.5–5.1)
Sodium: 141 mmol/L (ref 135–145)

## 2024-04-02 LAB — CBC
HCT: 36.4 % (ref 36.0–46.0)
Hemoglobin: 12.9 g/dL (ref 12.0–15.0)
MCH: 32.4 pg (ref 26.0–34.0)
MCHC: 35.4 g/dL (ref 30.0–36.0)
MCV: 91.5 fL (ref 80.0–100.0)
Platelets: 175 K/uL (ref 150–400)
RBC: 3.98 MIL/uL (ref 3.87–5.11)
RDW: 12.5 % (ref 11.5–15.5)
WBC: 11.1 K/uL — ABNORMAL HIGH (ref 4.0–10.5)
nRBC: 0 % (ref 0.0–0.2)

## 2024-04-02 LAB — MAGNESIUM: Magnesium: 1.5 mg/dL — ABNORMAL LOW (ref 1.7–2.4)

## 2024-04-02 LAB — OSMOLALITY, URINE: Osmolality, Ur: 313 mosm/kg (ref 300–900)

## 2024-04-02 LAB — PHOSPHORUS: Phosphorus: 2.6 mg/dL (ref 2.5–4.6)

## 2024-04-02 MED ORDER — INSULIN ASPART 100 UNIT/ML IJ SOLN
0.0000 [IU] | INTRAMUSCULAR | Status: DC
  Administered 2024-04-02: 2 [IU] via SUBCUTANEOUS
  Administered 2024-04-02: 5 [IU] via SUBCUTANEOUS
  Administered 2024-04-02: 8 [IU] via SUBCUTANEOUS
  Administered 2024-04-02: 5 [IU] via SUBCUTANEOUS
  Administered 2024-04-03: 2 [IU] via SUBCUTANEOUS
  Administered 2024-04-03 (×2): 5 [IU] via SUBCUTANEOUS
  Administered 2024-04-03: 3 [IU] via SUBCUTANEOUS
  Administered 2024-04-04: 2 [IU] via SUBCUTANEOUS
  Administered 2024-04-04 (×2): 3 [IU] via SUBCUTANEOUS
  Administered 2024-04-05: 5 [IU] via SUBCUTANEOUS
  Administered 2024-04-05 (×2): 3 [IU] via SUBCUTANEOUS
  Administered 2024-04-06: 2 [IU] via SUBCUTANEOUS

## 2024-04-02 MED ORDER — SODIUM CHLORIDE 0.9 % IV SOLN
12.5000 mg | Freq: Four times a day (QID) | INTRAVENOUS | Status: DC | PRN
  Administered 2024-04-02 – 2024-04-06 (×7): 12.5 mg via INTRAVENOUS
  Filled 2024-04-02 (×4): qty 0.5
  Filled 2024-04-02 (×4): qty 12.5

## 2024-04-02 MED ORDER — ENSURE MAX PROTEIN PO LIQD
11.0000 [oz_av] | Freq: Every day | ORAL | Status: DC
  Administered 2024-04-02 – 2024-04-07 (×4): 11 [oz_av] via ORAL
  Filled 2024-04-02 (×7): qty 330

## 2024-04-02 MED ORDER — DULOXETINE HCL 60 MG PO CPEP
60.0000 mg | ORAL_CAPSULE | Freq: Every day | ORAL | Status: DC
  Administered 2024-04-02 – 2024-04-07 (×6): 60 mg via ORAL
  Filled 2024-04-02 (×2): qty 1
  Filled 2024-04-02 (×2): qty 2
  Filled 2024-04-02 (×2): qty 1

## 2024-04-02 MED ORDER — ORAL CARE MOUTH RINSE: 15.0000 mL | OROMUCOSAL | Status: DC | PRN

## 2024-04-02 MED ORDER — MAGNESIUM SULFATE 4 GM/100ML IV SOLN
4.0000 g | Freq: Once | INTRAVENOUS | Status: AC
  Administered 2024-04-02: 4 g via INTRAVENOUS
  Filled 2024-04-02: qty 100

## 2024-04-02 MED ORDER — ENOXAPARIN SODIUM 40 MG/0.4ML IJ SOSY
40.0000 mg | PREFILLED_SYRINGE | INTRAMUSCULAR | Status: DC
  Administered 2024-04-02 – 2024-04-07 (×6): 40 mg via SUBCUTANEOUS
  Filled 2024-04-02 (×6): qty 0.4

## 2024-04-02 MED ORDER — INSULIN GLARGINE-YFGN 100 UNIT/ML ~~LOC~~ SOLN
30.0000 [IU] | Freq: Every day | SUBCUTANEOUS | Status: DC
  Administered 2024-04-02 – 2024-04-06 (×5): 30 [IU] via SUBCUTANEOUS
  Filled 2024-04-02 (×5): qty 0.3

## 2024-04-02 MED ORDER — LACTATED RINGERS IV BOLUS
1000.0000 mL | Freq: Once | INTRAVENOUS | Status: AC
  Administered 2024-04-02: 1000 mL via INTRAVENOUS

## 2024-04-02 NOTE — Progress Notes (Signed)
 [facility] ADULT ICU REPLACEMENT PROTOCOL   The patient does apply for the [facility] Adult ICU Electrolyte Replacment Protocol based on the criteria listed below:   1.Exclusion criteria: TCTS, ECMO, Dialysis, and Myasthenia Gravis patients 2. Is GFR >/= 30 ml/min? Yes.    Patient's GFR today is >60 3. Is SCr </= 2? Yes.   Patient's SCr is 0.61 mg/dL 4. Did SCr increase >/= 0.5 in 24 hours? No. 5.Pt's weight >40kg  Yes.   6. Abnormal electrolyte(s): Mg=1.5  7. Electrolytes replaced per protocol 8.  Call MD STAT for K+ </= 2.5, Phos </= 1, or Mag </= 1 Physician:  Haze, eMD   Rosina LOISE Hamilton 04/02/2024 6:19 AM

## 2024-04-02 NOTE — H&P (Signed)
 NAME:  Sara Hobbs, MRN:  [id number], DOB:  1960-12-25, LOS: 1 ADMISSION DATE:  04/01/2024, CONSULTATION DATE:  7/30 REFERRING MD:  Kommor, CHIEF COMPLAINT:  syncope and shock   History of Present Illness:  63 year old female patient with a complicated medical history including coronary artery disease dating back to 2017 with prior drug-eluting stent to the circumflex.  Ischemic cardiomyopathy with a EF 55 to 60% and grade 1 diastolic dysfunction, as well as trivial MR, tobacco abuse, COPD  Pertinent  Medical History  Copd, tobacco abuse, Major depression w/ psychotic features., CAD, CABG, PEA arrest 3/17, s/p DES to LCX, CM, DM, neuropathy, HTN, HLD, gerd, polycythemia  Recently seen in the emergency room on 7/24 after a fall where she sustained a right fifth metatarsal fracture. Presented to the emergency room via cardiology office where she was scheduled for cardiology follow-up in regards to her coronary artery disease. On arrival the patient was diaphoretic and lethargic.  She had brief loss of consciousness.  Systolic blood pressure in the 60s endorsing shortness of breath but no chest pain.  She was placed in the supine position with mild improvement in her blood pressure room air pulse oximetry 90 did endorse nausea and vomiting the day prior.  She has had no fever, no new cough, denies chest pain, sore throat, sick exposure.  Had some nausea and vomiting yesterday, 7/29 but no abdominal discomfort.  Has chronic back pain, denies dysuria or change in urine smell or appearance.  She does not check her blood glucose, has not been on her insulin  for over a year.  In regards to her fall she recalls that this was after getting lightheaded, she did not trip.  This happens from time to time she reports.  She has had persistent right lower extremity discomfort since the fall  EMS was called initial blood glucose 515, blood pressure on arrival 78/58 heart rate 70s lethargic administered 500  crystalloid with no significant clinical Change subsequently transferred to the emergency room. ER evaluation: Capillary blood glucose of 448 sodium 134 potassium 3.3 bicarbonate 27 creatinine 1.04 white blood cell count 8.9 hemoglobin 12.1 hematocrit 35 platelet count 166, initial troponin of 6, initial lactate 2.1. Received 2 L of lactated Ringer 's with minimal improvement in blood pressure so was started on norepinephrine  of note patient did begin to exhibit dark black stools while in ER. Because of persistent hypotension critical care was asked to admit    Significant Hospital Events: Including procedures, antibiotic start and stop dates in addition to other pertinent events   7/30 presented to the emergency room via cardiology office after brief syncopal episode and found to have systolic blood pressure in the 70s.  Initial glucose 148, normal bicarbonate, hemoglobin 12.1, initial lactate 10.1.  Had had a fall approximately 1 week prior with ongoing right lower extremity pain.  Critical care asked to admit after patient continued to be hypotensive following 2 L of crystalloid  Interim History / Subjective:  Stable, looks dry  Objective    Blood pressure (!) 109/50, pulse 71, temperature 98 F (36.7 C), temperature source Axillary, resp. rate 13, height 5' 1 (1.549 m), weight 74 kg, SpO2 94%.    FiO2 (%):  [21 %] 21 %   Intake/Output Summary (Last 24 hours) at 04/02/2024 1355 Last data filed at 04/02/2024 1250 Gross per 24 hour  Intake 3435.57 ml  Output 1500 ml  Net 1935.57 ml   Filed Weights   04/01/24 1627 04/02/24 0428  Weight:  73.5 kg 74 kg    Examination: General: Chronic ill-appearing lying in bed Eyes: EOMI, icterus Neck: Supple, no JVD appreciated Abdomen: Nondistended MSK: Moving well, no joint pain Pulmonary: Clear, no work of breathing Cardiovascular: Regular rate and rhythm Neuro: Alert oriented slow to respond at times  Resolved problem list   Assessment  and Plan   Syncope versus presyncope: Suspect orthostasis with hypotension likely related to hypovolemia. Plan Continue Volume resuscitation TTE reassuring   Hypotension: Suspect hypovolemia given exam. And preceding diarrhea with colitis on CT Plan Additional 1 L LR Peripheral norepinephrine  MAP goal greater than 65 Stop Abx, UA ok, CXR and CT CAP clear  Hyperglycemia.Type 2 diabetes, previously on insulin  however family reports has not taken insulin  in over a year initial blood glucose over 400 Plan S/p IV insulin , transition to SQ  History of hypertension, coronary artery disease with prior DES to circumflex, nonischemic cardiomyopathy, and diastolic heart failure with EF 55 to 60% Severe MVR Plan Holding her Coreg , wonder if this is why she does not have compensatory tachycardia Hold her isosorbide , Zestril  Continue her Plavix  TTE with severe MVR  History of COPD: Typically on Spiriva  and Advair Plan Continue triple combo nebs Continue pulse oximetry Supplemental oxygen for sats greater than 90  History of diabetic neuropathy Plan Resume Neurontin    History of hyperlipidemia Plan Resume her atorvastatin  and fenofibrate   Acute nondisplaced fracture of the fifth right metatarsal Plan Continue with immobilizer PRN apap  Best practice: Diet/type: Regular consistency (see orders) DVT prophylaxis prophylactic heparin   Pressure ulcer(s): N/A GI prophylaxis: H2B Lines: N/A Foley:  N/A Code Status:  full code Last date of multidisciplinary goals of care discussion [pending]  Labs   CBC: Recent Labs  Lab 04/01/24 1636 04/01/24 1809 04/01/24 2136 04/02/24 0259 04/02/24 0630  WBC 8.9  --  12.2* 11.1*  --   NEUTROABS 6.3  --   --   --   --   HGB 12.1 12.9 13.1 12.9 12.2  HCT 35.0* 36.7 37.5 36.4 36.0  MCV 91.6  --  91.9 91.5  --   PLT 166  --  175 175  --     Basic Metabolic Panel: Recent Labs  Lab 04/01/24 1636 04/01/24 2136 04/02/24 0259  04/02/24 0630  NA 134*  --  141 142  K 3.3*  --  4.0 3.3*  CL 97*  --  107  --   CO2 27  --  27  --   GLUCOSE 472*  --  123*  --   BUN 17  --  11  --   CREATININE 1.04*  --  0.61  --   CALCIUM  8.5*  --  8.8*  --   MG  --   --  1.5*  --   PHOS  --  3.1 2.6  --    GFR: Estimated Creatinine Clearance: 67.1 mL/min (by C-G formula based on SCr of 0.61 mg/dL). Recent Labs  Lab 04/01/24 1636 04/01/24 1708 04/01/24 1919 04/01/24 2136 04/02/24 0259  WBC 8.9  --   --  12.2* 11.1*  LATICACIDVEN  --  2.1* 1.1  --   --     Liver Function Tests: Recent Labs  Lab 04/01/24 1636  AST 21  ALT 45*  ALKPHOS 81  BILITOT 0.4  PROT 5.1*  ALBUMIN  3.2*   Recent Labs  Lab 04/01/24 2136  LIPASE 25  AMYLASE 17*   No results for input(s): AMMONIA in the last 168 hours.  ABG    Component Value Date/Time   PHART 7.421 04/02/2024 0630   PCO2ART 41.0 04/02/2024 0630   PO2ART 97 04/02/2024 0630   HCO3 26.7 04/02/2024 0630   TCO2 28 04/02/2024 0630   ACIDBASEDEF 4.0 (H) 11/26/2015 0420   O2SAT 98 04/02/2024 0630     Coagulation Profile: No results for input(s): INR, PROTIME in the last 168 hours.  Cardiac Enzymes: No results for input(s): CKTOTAL, CKMB, CKMBINDEX, TROPONINI in the last 168 hours.  HbA1C: Hgb A1c MFr Bld  Date/Time Value Ref Range Status  04/01/2024 09:36 PM 12.7 (H) 4.8 - 5.6 % Final    Comment:    (NOTE) Diagnosis of Diabetes The following HbA1c ranges recommended by the American Diabetes Association (ADA) may be used as an aid in the diagnosis of diabetes mellitus.  Hemoglobin             Suggested A1C NGSP%              Diagnosis  <5.7                   Non Diabetic  5.7-6.4                Pre-Diabetic  >6.4                   Diabetic  <7.0                   Glycemic control for                       adults with diabetes.    06/18/2016 12:40 AM 12.5 (H) 4.8 - 5.6 % Final    Comment:    (NOTE)         Pre-diabetes: 5.7 - 6.4          Diabetes: >6.4         Glycemic control for adults with diabetes: <7.0     CBG: Recent Labs  Lab 04/02/24 0500 04/02/24 0604 04/02/24 0801 04/02/24 0842 04/02/24 1141  GLUCAP 152* 145* 83 105* 240*    Review of Systems:   N/a  Past Medical History:  She,  has a past medical history of Angina pectoris (HCC), Anxiety, Arthritis, Arthritis, Asthma, CAD (coronary artery disease), CAD, multiple vessel, Cardiac arrest (HCC) (11/25/2015), Chest tightness, CHF (congestive heart failure) (HCC), Chronic lower back pain, Chronic shoulder pain, COPD (chronic obstructive pulmonary disease) (HCC), Coronary artery disease, Depression, Diabetes mellitus with complication (HCC), Diabetes mellitus without complication (HCC), Essential hypertension (05/20/2018), GERD (gastroesophageal reflux disease), Headache, History of blood transfusion, History of hiatal hernia, History of stomach ulcers, Hyperlipidemia, Hypothyroidism, IDDM (insulin  dependent diabetes mellitus), Incidental lung nodule, > 3mm and < 8mm (11/22/2014), Myocardial infarction (HCC) (11/2013), Neuropathy, Neuropathy due to secondary diabetes (HCC), and Wears glasses.   Surgical History:   Past Surgical History:  Procedure Laterality Date   ABDOMINAL HYSTERECTOMY  1990's   ABDOMINAL HYSTERECTOMY     APPLICATION OF WOUND VAC N/A 11/23/2014   Procedure: POSSIBLE APPLICATION OF WOUND VAC;  Surgeon: Sudie VEAR Laine, MD;  Location: [facility] OR;  Service: Thoracic;  Laterality: N/A;   BREAST SURGERY Left 1990's   breast duct surgery   CARDIAC CATHETERIZATION  09/01/14   CARDIAC CATHETERIZATION N/A 11/25/2015   Procedure: Left Heart Cath and Cors/Grafts Angiography;  Surgeon: Ozell Fell, MD;  [facility] INVASIVE CV LAB;  Service: Cardiovascular;  Laterality: N/A;   CARDIAC CATHETERIZATION N/A  11/25/2015   Procedure: Coronary Stent Intervention;  Surgeon: Ozell Fell, MD;  Location: [facility] INVASIVE CV LAB;  Service: Cardiovascular;   Laterality: N/A;   CARDIAC CATHETERIZATION N/A 06/19/2016   Procedure: Left Heart Cath and Cors/Grafts Angiography;  Surgeon: Peter M Swaziland, MD;  Location: [facility] INVASIVE CV LAB;  Service: Cardiovascular;  Laterality: N/A;   CARDIAC SURGERY     CESAREAN SECTION  1982; 1984   CESAREAN SECTION     COLONOSCOPY     CORONARY ANGIOPLASTY WITH STENT PLACEMENT  11/11/2013   Drug-eluting stent in LCx   CORONARY ARTERY BYPASS GRAFT N/A 09/22/2014   Procedure: CORONARY ARTERY BYPASS GRAFTING (CABG);  Surgeon: Sudie VEAR Laine, MD;  Location: [facility] OR;  Service: Open Heart Surgery;  Laterality: N/A;  Times 2 using left internal mammary artery and endoscopically harvested right saphenous vein   ESOPHAGOGASTRODUODENOSCOPY     GASTRIC RESECTION  1990's   bezoar; for reflux   LAPAROSCOPIC CHOLECYSTECTOMY  2000   STERNAL WOUND DEBRIDEMENT N/A 11/23/2014   Procedure: SUPERFICIAL STERNAL WOUND DEBRIDEMENT;  Surgeon: Sudie VEAR Laine, MD;  Location: [facility] OR;  Service: Thoracic;  Laterality: N/A;   TEE WITHOUT CARDIOVERSION N/A 09/22/2014   Procedure: TRANSESOPHAGEAL ECHOCARDIOGRAM (TEE);  Surgeon: Sudie VEAR Laine, MD;  Location: [facility] OR;  Service: Open Heart Surgery;  Laterality: N/A;     Social History:   reports that she has been smoking cigarettes. She has a 82 pack-year smoking history. She has never used smokeless tobacco. She reports that she does not currently use alcohol. She reports that she does not use drugs.   Family History:  Her She was adopted. Family history is unknown by patient.   Allergies Allergies  Allergen Reactions   Wellbutrin [Bupropion] Other (See Comments) and Palpitations    Makes me feel like im having a heart attack SEVERE CHEST PAIN     Home Medications  Prior to Admission medications   Medication Sig Start Date End Date Taking? Authorizing Provider  albuterol  (PROVENTIL  HFA;VENTOLIN  HFA) 108 (90 BASE) MCG/ACT inhaler Inhale 2 puffs into the lungs every 6 (six) hours as needed  for wheezing or shortness of breath.    [provider]  atorvastatin  (LIPITOR ) 80 MG tablet Take 1 tablet (80 mg total) by mouth daily. 01/10/24   Fell Ozell, MD  carvedilol  (COREG ) 3.125 MG tablet Take 1 tablet (3.125 mg total) by mouth 2 (two) times daily with a meal. 01/10/24   Fell Ozell, MD  Cholecalciferol (VITAMIN D3) 1.25 MG (50000 UT) CAPS Take 1 capsule by mouth daily. 01/31/21   [provider]  clopidogrel  (PLAVIX ) 75 MG tablet Take 1 tablet (75 mg total) by mouth daily. 01/10/24   Fell Ozell, MD  dapagliflozin propanediol (FARXIGA) 10 MG TABS tablet Take 10 mg by mouth daily. 01/22/20   [provider]  diphenhydrAMINE-APAP, sleep, (TYLENOL  PM EXTRA STRENGTH PO) Take by mouth at bedtime as needed.    [provider]  fenofibrate  54 MG tablet Take 54 mg by mouth daily.    [provider]  Fluticasone-Salmeterol (ADVAIR) 100-50 MCG/DOSE AEPB Inhale 1 puff into the lungs 2 (two) times daily as needed (shortness of breath).     [provider]  gabapentin  (NEURONTIN ) 100 MG capsule Take 100 mg by mouth daily with breakfast. 04/15/20   [provider]  insulin  aspart (NOVOLOG ) 100 UNIT/ML FlexPen Inject 0-5 Units into the skin daily. Based on sliding scale    [provider]  Insulin  Glargine-yfgn 100  UNIT/ML SOPN Take 40 Units by mouth 2 (two) times daily.    [provider]  isosorbide  mononitrate (IMDUR ) 60 MG 24 hr tablet Take 1 tablet (60 mg total) by mouth daily. 01/10/24   Wonda Sharper, MD  lisinopril  (ZESTRIL ) 2.5 MG tablet Take 1 tablet (2.5 mg total) by mouth daily. 01/10/24   Cooper, Michael, MD  meclizine (ANTIVERT) 25 MG tablet Take 25 mg by mouth at bedtime.    [provider]  methocarbamol (ROBAXIN) 750 MG tablet SMARTSIG:1 Tablet(s) By Mouth Every 12 Hours 07/14/21   [provider]  nitroGLYCERIN  (NITROSTAT ) 0.4 MG SL tablet DISSOLVE ONE TABLET UNDER THE TONGUE EVERY 5  MINUTES AS NEEDED FOR CHEST PAIN.  DO NOT EXCEED A TOTAL OF 3 DOSES IN 15 MINUTES NOW 04/17/23   Wonda Sharper, MD  ondansetron  (ZOFRAN ) 8 MG tablet Take 8 mg by mouth every 8 (eight) hours as needed. 06/08/21   [provider]  oxycodone  (ROXICODONE ) 30 MG immediate release tablet Take 15 mg by mouth 4 (four) times daily. 07/10/21   [provider]  OXYCODONE  ER PO Take 1 tablet by mouth every 8 (eight) hours as needed (PAIN).     [provider]  promethazine  (PHENERGAN ) 12.5 MG tablet Take 12.5 mg by mouth every 8 (eight) hours as needed. 07/14/21   [provider]  promethazine  (PHENERGAN ) 25 MG tablet Take 1 tablet (25 mg total) by mouth every 6 (six) hours as needed for nausea or vomiting. 03/26/24   Kammerer, Megan L, DO  tiotropium (SPIRIVA ) 18 MCG inhalation capsule Place 18 mcg into inhaler and inhale daily as needed (shortness of breath).     [provider]     Critical care time:      CRITICAL CARE Performed by: Donnice JONELLE Beals   Total critical care time: 32 minutes  Critical care time was exclusive of separately billable procedures and treating other patients.  Critical care was necessary to treat or prevent imminent or life-threatening deterioration.  Critical care was time spent personally by me on the following activities: development of treatment plan with patient and/or surrogate as well as nursing, discussions with consultants, evaluation of patient's response to treatment, examination of patient, obtaining history from patient or surrogate, ordering and performing treatments and interventions, ordering and review of laboratory studies, ordering and review of radiographic studies, pulse oximetry and re-evaluation of patient's condition.   Donnice JONELLE Beals, MD See TRACEY

## 2024-04-02 NOTE — Progress Notes (Signed)
 VASCULAR LAB    Right lower extremity venous duplex has been performed.  See CV proc for preliminary results.   Askari Kinley, RVT 04/02/2024, 1:29 PM

## 2024-04-02 NOTE — Plan of Care (Signed)
  Problem: Coping: Goal: Ability to adjust to condition or change in health will improve Outcome: Progressing   Problem: Health Behavior/Discharge Planning: Goal: Ability to manage health-related needs will improve Outcome: Progressing   Problem: Metabolic: Goal: Ability to maintain appropriate glucose levels will improve Outcome: Progressing

## 2024-04-02 NOTE — TOC CM/SW Note (Signed)
 Transition of Care [facility]) - Inpatient Brief Assessment   Patient Details  Name: Sara Hobbs MRN: [id number] Date of Birth: 09-04-1960  [facility] CM/SW Contact:    Lauraine FORBES Saa, LCSW Phone Number: 04/02/2024, 8:49 AM   Clinical Narrative:  8:49 AM Per chart review, patient resides at home. Patient has a PCP and insurance. Patient does not have SNF history. Patient has HH history with [facility]. Patient has a BSC and rolling walker. Patient's preferred pharmacy's are [facility] [place], [facility] [address] [facility] [address] [place], [facility] [address], and [facility] [address] [place]. No TOC needs were identified at this time. TOC will continue to follow and be available to assist.  Transition of Care Asessment: Insurance and Status: Insurance coverage has been reviewed Patient has primary care physician: Yes Home environment has been reviewed: Private Residence Prior level of function:: N/A Prior/Current Home Services: No current home services (Has HH/DME history) Social Drivers of Health Review: SDOH reviewed no interventions necessary Readmission risk has been reviewed: Yes Transition of care needs: no transition of care needs at this time

## 2024-04-02 NOTE — Progress Notes (Addendum)
 NAME:  Sara Hobbs, MRN:  [id number], DOB:  12-20-1960, LOS: 1 ADMISSION DATE:  04/01/2024, CONSULTATION DATE: 04/01/2024 REFERRING MD: EDP, CHIEF COMPLAINT: Hypotension  History of Present Illness:  Sara Hobbs is a 63 year old female with a history of LVH and uncontrolled diabetes who presented with presyncope from his cardiologist office requiring pressors and admitted to the ICU.  Pertinent  Medical History   Past Medical History:  Diagnosis Date   Angina pectoris (HCC)    Anxiety    Arthritis    hands, neck, back, knees, hips, ankles (11/22/2014)   Arthritis    Asthma    CAD (coronary artery disease)    a. 09/2014: LIMA to LAD, SVG to PDA, EVH via right thigh b. Cardiac Arrest 11/2015: patent LIMA-LAD, CTO if SVG-PDA, 99% stenosis of Prox-Mid Cx w/ DES placed // Myoview  7/22 EF 71, inf infarct with peri-infarct ischemia; intermediate risk   CAD, multiple vessel    Cardiac arrest (HCC) 11/25/2015   Chest tightness    CHF (congestive heart failure) (HCC)    Echocardiogram 7/22: EF 55-60, no RWMA, mod LVH, Gr 1 DD, normal RVSF, trivial MR   Chronic lower back pain    Chronic shoulder pain    right; I've got bad rotator cuff   COPD (chronic obstructive pulmonary disease) (HCC)    Coronary artery disease    Depression    Diabetes mellitus with complication (HCC)    Diabetes mellitus without complication (HCC)    Essential hypertension 05/20/2018   GERD (gastroesophageal reflux disease)    Headache    monthly (11/22/2014)   History of blood transfusion    when I had my 1st youngun; when I had MI   History of hiatal hernia    History of stomach ulcers    Hyperlipidemia    Hypothyroidism    PMH:   IDDM (insulin  dependent diabetes mellitus)    Incidental lung nodule, > 3mm and < 8mm 11/22/2014   Small non-calcified nodules left lung   Myocardial infarction (HCC) 11/2013   PCI w/ drug eluting stent LCx   Neuropathy    Neuropathy due to secondary diabetes (HCC)     Wears glasses     Significant Hospital Events: Including procedures, antibiotic start and stop dates in addition to other pertinent events   7/30 Admitted  Interim History / Subjective:  Patient laying in bed,denies any issues , said she is feeling better .  Objective   Blood pressure (!) 96/58, pulse 83, temperature 98 F (36.7 C), temperature source Axillary, resp. rate 16, height 5' 1 (1.549 m), weight 74 kg, SpO2 96%.    FiO2 (%):  [21 %] 21 %   Intake/Output Summary (Last 24 hours) at 04/02/2024 0905 Last data filed at 04/02/2024 0600 Gross per 24 hour  Intake 2000.56 ml  Output 1500 ml  Net 500.56 ml   Filed Weights   04/01/24 1627 04/02/24 0428  Weight: 73.5 kg 74 kg   Examination: General: Well-appearing woman, laying in bed in no acute distress HENT:  Lungs: Clear to auscultation on 2 L nasal cannula Cardiovascular: Regular rate and rhythm Abdomen: Soft and nontender Extremities: Warm and dry Neuro: Oriented times place and person   Resolved Hospital Problem list     Assessment & Plan:  Syncope versus presyncope:  Suspect orthostasis with hypotension likely related to hypovolemia. - Continue workup for cardiac etiology - Scheduled for an echo today - Will follow-up official read - PT/OT   Hypotension:  -  BP improved this morning. MAP of 72 - s/p 3L  - Wean off Levophed  today ,if pressure drops after Levo is off , give a bolus of fluid  - UA is clear. Discontinue Abx,no indication at this time    Diabetes with hyperglycemia:  - Blood sugars is getting better. 145 << 452 on insulin  drip  - Stop infusion and start SSI - CGM   History of hypertension: - BP is still softer. Continue to hold home  Coreg , isosorbide , lisinopril    Reported history of COPD: - On 2 L nasal cannula satting at 96% - Wean off oxygen requiring as tolerated - Triple inhaled therapy via nebulizer scheduled, as needed bronchodilators   Hyperlipidemia: -- Resume home  medicines  DVT ppx - Switch heparin  to Lovenox    Best Practice (right click and Reselect all SmartList Selections daily)   Diet/type: NPO w/ oral meds DVT prophylaxis: prophylactic heparin   GI prophylaxis: N/A Lines: N/A Foley:  Yes, and it is still needed Code Status:  full code Last date of multidisciplinary goals of care discussion [per above]  Labs   CBC: Recent Labs  Lab 04/01/24 1636 04/01/24 1809 04/01/24 2136 04/02/24 0259 04/02/24 0630  WBC 8.9  --  12.2* 11.1*  --   NEUTROABS 6.3  --   --   --   --   HGB 12.1 12.9 13.1 12.9 12.2  HCT 35.0* 36.7 37.5 36.4 36.0  MCV 91.6  --  91.9 91.5  --   PLT 166  --  175 175  --     Basic Metabolic Panel: Recent Labs  Lab 04/01/24 1636 04/01/24 2136 04/02/24 0259 04/02/24 0630  NA 134*  --  141 142  K 3.3*  --  4.0 3.3*  CL 97*  --  107  --   CO2 27  --  27  --   GLUCOSE 472*  --  123*  --   BUN 17  --  11  --   CREATININE 1.04*  --  0.61  --   CALCIUM  8.5*  --  8.8*  --   MG  --   --  1.5*  --   PHOS  --  3.1 2.6  --    GFR: Estimated Creatinine Clearance: 67.1 mL/min (by C-G formula based on SCr of 0.61 mg/dL). Recent Labs  Lab 04/01/24 1636 04/01/24 1708 04/01/24 1919 04/01/24 2136 04/02/24 0259  WBC 8.9  --   --  12.2* 11.1*  LATICACIDVEN  --  2.1* 1.1  --   --     Liver Function Tests: Recent Labs  Lab 04/01/24 1636  AST 21  ALT 45*  ALKPHOS 81  BILITOT 0.4  PROT 5.1*  ALBUMIN  3.2*   Recent Labs  Lab 04/01/24 2136  LIPASE 25  AMYLASE 17*   No results for input(s): AMMONIA in the last 168 hours.  ABG    Component Value Date/Time   PHART 7.421 04/02/2024 0630   PCO2ART 41.0 04/02/2024 0630   PO2ART 97 04/02/2024 0630   HCO3 26.7 04/02/2024 0630   TCO2 28 04/02/2024 0630   ACIDBASEDEF 4.0 (H) 11/26/2015 0420   O2SAT 98 04/02/2024 0630     Coagulation Profile: No results for input(s): INR, PROTIME in the last 168 hours.  Cardiac Enzymes: No results for input(s):  CKTOTAL, CKMB, CKMBINDEX, TROPONINI in the last 168 hours.  HbA1C: Hgb A1c MFr Bld  Date/Time Value Ref Range Status  04/01/2024 09:36 PM 12.7 (H) 4.8 - 5.6 %  Final    Comment:    (NOTE) Diagnosis of Diabetes The following HbA1c ranges recommended by the American Diabetes Association (ADA) may be used as an aid in the diagnosis of diabetes mellitus.  Hemoglobin             Suggested A1C NGSP%              Diagnosis  <5.7                   Non Diabetic  5.7-6.4                Pre-Diabetic  >6.4                   Diabetic  <7.0                   Glycemic control for                       adults with diabetes.    06/18/2016 12:40 AM 12.5 (H) 4.8 - 5.6 % Final    Comment:    (NOTE)         Pre-diabetes: 5.7 - 6.4         Diabetes: >6.4         Glycemic control for adults with diabetes: <7.0     CBG: Recent Labs  Lab 04/02/24 0403 04/02/24 0500 04/02/24 0604 04/02/24 0801 04/02/24 0842  GLUCAP 136* 152* 145* 83 105*    Review of Systems:   Denies any chest pain or shortness of breath  Past Medical History:  She,  has a past medical history of Angina pectoris (HCC), Anxiety, Arthritis, Arthritis, Asthma, CAD (coronary artery disease), CAD, multiple vessel, Cardiac arrest (HCC) (11/25/2015), Chest tightness, CHF (congestive heart failure) (HCC), Chronic lower back pain, Chronic shoulder pain, COPD (chronic obstructive pulmonary disease) (HCC), Coronary artery disease, Depression, Diabetes mellitus with complication (HCC), Diabetes mellitus without complication (HCC), Essential hypertension (05/20/2018), GERD (gastroesophageal reflux disease), Headache, History of blood transfusion, History of hiatal hernia, History of stomach ulcers, Hyperlipidemia, Hypothyroidism, IDDM (insulin  dependent diabetes mellitus), Incidental lung nodule, > 3mm and < 8mm (11/22/2014), Myocardial infarction (HCC) (11/2013), Neuropathy, Neuropathy due to secondary diabetes (HCC), and Wears  glasses.   Surgical History:   Past Surgical History:  Procedure Laterality Date   ABDOMINAL HYSTERECTOMY  1990's   ABDOMINAL HYSTERECTOMY     APPLICATION OF WOUND VAC N/A 11/23/2014   Procedure: POSSIBLE APPLICATION OF WOUND VAC;  Surgeon: Sudie VEAR Laine, MD;  Location: [facility] OR;  Service: Thoracic;  Laterality: N/A;   BREAST SURGERY Left 1990's   breast duct surgery   CARDIAC CATHETERIZATION  09/01/14   CARDIAC CATHETERIZATION N/A 11/25/2015   Procedure: Left Heart Cath and Cors/Grafts Angiography;  Surgeon: Ozell Fell, MD;  Location: [facility] INVASIVE CV LAB;  Service: Cardiovascular;  Laterality: N/A;   CARDIAC CATHETERIZATION N/A 11/25/2015   Procedure: Coronary Stent Intervention;  Surgeon: Ozell Fell, MD;  Location: [facility] INVASIVE CV LAB;  Service: Cardiovascular;  Laterality: N/A;   CARDIAC CATHETERIZATION N/A 06/19/2016   Procedure: Left Heart Cath and Cors/Grafts Angiography;  Surgeon: Peter M Swaziland, MD;  [facility] INVASIVE CV LAB;  Service: Cardiovascular;  Laterality: N/A;   CARDIAC SURGERY     CESAREAN SECTION  1982; 1984   CESAREAN SECTION     COLONOSCOPY     CORONARY ANGIOPLASTY WITH STENT PLACEMENT  11/11/2013   Drug-eluting stent in LCx  CORONARY ARTERY BYPASS GRAFT N/A 09/22/2014   Procedure: CORONARY ARTERY BYPASS GRAFTING (CABG);  Surgeon: Sudie VEAR Laine, MD;  Location: [facility] OR;  Service: Open Heart Surgery;  Laterality: N/A;  Times 2 using left internal mammary artery and endoscopically harvested right saphenous vein   ESOPHAGOGASTRODUODENOSCOPY     GASTRIC RESECTION  1990's   bezoar; for reflux   LAPAROSCOPIC CHOLECYSTECTOMY  2000   STERNAL WOUND DEBRIDEMENT N/A 11/23/2014   Procedure: SUPERFICIAL STERNAL WOUND DEBRIDEMENT;  Surgeon: Sudie VEAR Laine, MD;  Location: [facility] OR;  Service: Thoracic;  Laterality: N/A;   TEE WITHOUT CARDIOVERSION N/A 09/22/2014   Procedure: TRANSESOPHAGEAL ECHOCARDIOGRAM (TEE);  Surgeon: Sudie VEAR Laine, MD;  Location: [facility] OR;  Service: Open  Heart Surgery;  Laterality: N/A;     Social History:   reports that she has been smoking cigarettes. She has a 82 pack-year smoking history. She has never used smokeless tobacco. She reports that she does not currently use alcohol. She reports that she does not use drugs.   Family History:  Her She was adopted. Family history is unknown by patient.   Allergies Allergies  Allergen Reactions   Wellbutrin [Bupropion] Other (See Comments) and Palpitations    Makes me feel like im having a heart attack SEVERE CHEST PAIN     Home Medications  Prior to Admission medications   Medication Sig Start Date End Date Taking? Authorizing Provider  albuterol  (PROVENTIL  HFA;VENTOLIN  HFA) 108 (90 BASE) MCG/ACT inhaler Inhale 2 puffs into the lungs every 6 (six) hours as needed for wheezing or shortness of breath.    [provider]  atorvastatin  (LIPITOR ) 80 MG tablet Take 1 tablet (80 mg total) by mouth daily. 01/10/24   Wonda Sharper, MD  carvedilol  (COREG ) 3.125 MG tablet Take 1 tablet (3.125 mg total) by mouth 2 (two) times daily with a meal. 01/10/24   Wonda Sharper, MD  Cholecalciferol (VITAMIN D3) 1.25 MG (50000 UT) CAPS Take 1 capsule by mouth daily. 01/31/21   [provider]  clopidogrel  (PLAVIX ) 75 MG tablet Take 1 tablet (75 mg total) by mouth daily. 01/10/24   Wonda Sharper, MD  dapagliflozin propanediol (FARXIGA) 10 MG TABS tablet Take 10 mg by mouth daily. 01/22/20   [provider]  diphenhydrAMINE-APAP, sleep, (TYLENOL  PM EXTRA STRENGTH PO) Take by mouth at bedtime as needed.    [provider]  fenofibrate  54 MG tablet Take 54 mg by mouth daily.    [provider]  Fluticasone-Salmeterol (ADVAIR) 100-50 MCG/DOSE AEPB Inhale 1 puff into the lungs 2 (two) times daily as needed (shortness of breath).     [provider]  gabapentin  (NEURONTIN ) 100 MG capsule Take 100 mg by mouth daily with breakfast. 04/15/20   [provider]   insulin  aspart (NOVOLOG ) 100 UNIT/ML FlexPen Inject 0-5 Units into the skin daily. Based on sliding scale    [provider]  Insulin  Glargine-yfgn 100 UNIT/ML SOPN Take 40 Units by mouth 2 (two) times daily.    [provider]  isosorbide  mononitrate (IMDUR ) 60 MG 24 hr tablet Take 1 tablet (60 mg total) by mouth daily. 01/10/24   Wonda Sharper, MD  lisinopril  (ZESTRIL ) 2.5 MG tablet Take 1 tablet (2.5 mg total) by mouth daily. 01/10/24   Cooper, Michael, MD  meclizine (ANTIVERT) 25 MG tablet Take 25 mg by mouth at bedtime.    [provider]  methocarbamol (ROBAXIN) 750 MG tablet SMARTSIG:1 Tablet(s) By Mouth Every 12 Hours 07/14/21   [provider]  nitroGLYCERIN  (NITROSTAT ) 0.4 MG SL tablet DISSOLVE ONE TABLET UNDER THE TONGUE EVERY 5 MINUTES AS NEEDED FOR CHEST PAIN.  DO NOT EXCEED A TOTAL OF 3 DOSES IN 15 MINUTES NOW 04/17/23   Wonda Sharper, MD  ondansetron  (ZOFRAN ) 8 MG tablet Take 8 mg by mouth every 8 (eight) hours as needed. 06/08/21   [provider]  oxyCODONE  (ROXICODONE ) 15 MG immediate release tablet Take 15 mg by mouth 4 (four) times daily. 07/10/21   [provider]  promethazine  (PHENERGAN ) 12.5 MG tablet Take 12.5 mg by mouth every 8 (eight) hours as needed. 07/14/21   [provider]  promethazine  (PHENERGAN ) 25 MG tablet Take 1 tablet (25 mg total) by mouth every 6 (six) hours as needed for nausea or vomiting. 03/26/24   Kammerer, Megan L, DO  tiotropium (SPIRIVA ) 18 MCG inhalation capsule Place 18 mcg into inhaler and inhale daily as needed (shortness of breath).     [provider]     Critical care time: 89 Min       Drue Grow MD [facility] Internal Medicine Program - PGY-2 04/02/2024, 9:05 AM Pager# [phone]

## 2024-04-02 NOTE — Progress Notes (Signed)
 NAME:  Sara Hobbs, MRN:  [id number], DOB:  11/30/1960, LOS: 1 ADMISSION DATE:  04/01/2024, CONSULTATION DATE:  7/30 REFERRING MD:  Kommor, CHIEF COMPLAINT:  syncope and shock   History of Present Illness:  63 year old female patient with a complicated medical history including coronary artery disease dating back to 2017 with prior drug-eluting stent to the circumflex.  Ischemic cardiomyopathy with a EF 55 to 60% and grade 1 diastolic dysfunction, as well as trivial MR, tobacco abuse, COPD  Pertinent  Medical History  Copd, tobacco abuse, Major depression w/ psychotic features., CAD, CABG, PEA arrest 3/17, s/p DES to LCX, CM, DM, neuropathy, HTN, HLD, gerd, polycythemia  Recently seen in the emergency room on 7/24 after a fall where she sustained a right fifth metatarsal fracture. Presented to the emergency room via cardiology office where she was scheduled for cardiology follow-up in regards to her coronary artery disease. On arrival the patient was diaphoretic and lethargic.  She had brief loss of consciousness.  Systolic blood pressure in the 60s endorsing shortness of breath but no chest pain.  She was placed in the supine position with mild improvement in her blood pressure room air pulse oximetry 90 did endorse nausea and vomiting the day prior.  She has had no fever, no new cough, denies chest pain, sore throat, sick exposure.  Had some nausea and vomiting yesterday, 7/29 but no abdominal discomfort.  Has chronic back pain, denies dysuria or change in urine smell or appearance.  She does not check her blood glucose, has not been on her insulin  for over a year.  In regards to her fall she recalls that this was after getting lightheaded, she did not trip.  This happens from time to time she reports.  She has had persistent right lower extremity discomfort since the fall  EMS was called initial blood glucose 515, blood pressure on arrival 78/58 heart rate 70s lethargic administered 500  crystalloid with no significant clinical Change subsequently transferred to the emergency room. ER evaluation: Capillary blood glucose of 448 sodium 134 potassium 3.3 bicarbonate 27 creatinine 1.04 white blood cell count 8.9 hemoglobin 12.1 hematocrit 35 platelet count 166, initial troponin of 6, initial lactate 2.1. Received 2 L of lactated Ringer 's with minimal improvement in blood pressure so was started on norepinephrine  of note patient did begin to exhibit dark black stools while in ER. Because of persistent hypotension critical care was asked to admit    Significant Hospital Events: Including procedures, antibiotic start and stop dates in addition to other pertinent events   7/30 presented to the emergency room via cardiology office after brief syncopal episode and found to have systolic blood pressure in the 70s.  Initial glucose 148, normal bicarbonate, hemoglobin 12.1, initial lactate 10.1.  Had had a fall approximately 1 week prior with ongoing right lower extremity pain.  Critical care asked to admit after patient continued to be hypotensive following 2 L of crystalloid  Interim History / Subjective:  Stable, looks dry  Objective    Blood pressure (!) 106/51, pulse 69, temperature (!) 96.6 F (35.9 C), temperature source Axillary, resp. rate 14, height 5' 1 (1.549 m), weight 74 kg, SpO2 97%.    FiO2 (%):  [21 %] 21 %   Intake/Output Summary (Last 24 hours) at 04/02/2024 1524 Last data filed at 04/02/2024 1400 Gross per 24 hour  Intake 3450.63 ml  Output 1500 ml  Net 1950.63 ml   Filed Weights   04/01/24 1627 04/02/24 0428  Weight: 73.5 kg 74 kg    Examination: General: Chronic ill-appearing lying in bed Eyes: EOMI, icterus Neck: Supple, no JVD appreciated Abdomen: Nondistended MSK: Moving well, no joint pain Pulmonary: Clear, no work of breathing Cardiovascular: Regular rate and rhythm Neuro: Alert oriented slow to respond at times  Resolved problem list    Assessment and Plan   Syncope versus presyncope: Suspect orthostasis with hypotension likely related to hypovolemia. Plan Continue Volume resuscitation TTE reassuring   Hypotension: Suspect hypovolemia given exam. And preceding diarrhea with colitis on CT Plan Additional 1 L LR Peripheral norepinephrine  MAP goal greater than 65 Stop Abx, UA ok, CXR and CT CAP clear  Hyperglycemia.Type 2 diabetes, previously on insulin  however family reports has not taken insulin  in over a year initial blood glucose over 400 Plan S/p IV insulin , transition to SQ  History of hypertension, coronary artery disease with prior DES to circumflex, nonischemic cardiomyopathy, and diastolic heart failure with EF 55 to 60% Severe MVR Plan Holding her Coreg , wonder if this is why she does not have compensatory tachycardia Hold her isosorbide , Zestril  Continue her Plavix  TTE with severe MVR  History of COPD: Typically on Spiriva  and Advair Plan Continue triple combo nebs Continue pulse oximetry Supplemental oxygen for sats greater than 90  History of diabetic neuropathy Plan Resume Neurontin    History of hyperlipidemia Plan Resume her atorvastatin  and fenofibrate   Acute nondisplaced fracture of the fifth right metatarsal Plan Continue with immobilizer PRN apap  Best practice: Diet/type: Regular consistency (see orders) DVT prophylaxis prophylactic heparin   Pressure ulcer(s): N/A GI prophylaxis: H2B Lines: N/A Foley:  N/A Code Status:  full code Last date of multidisciplinary goals of care discussion [pending]  Labs   CBC: Recent Labs  Lab 04/01/24 1636 04/01/24 1809 04/01/24 2136 04/02/24 0259 04/02/24 0630  WBC 8.9  --  12.2* 11.1*  --   NEUTROABS 6.3  --   --   --   --   HGB 12.1 12.9 13.1 12.9 12.2  HCT 35.0* 36.7 37.5 36.4 36.0  MCV 91.6  --  91.9 91.5  --   PLT 166  --  175 175  --     Basic Metabolic Panel: Recent Labs  Lab 04/01/24 1636 04/01/24 2136  04/02/24 0259 04/02/24 0630  NA 134*  --  141 142  K 3.3*  --  4.0 3.3*  CL 97*  --  107  --   CO2 27  --  27  --   GLUCOSE 472*  --  123*  --   BUN 17  --  11  --   CREATININE 1.04*  --  0.61  --   CALCIUM  8.5*  --  8.8*  --   MG  --   --  1.5*  --   PHOS  --  3.1 2.6  --    GFR: Estimated Creatinine Clearance: 67.1 mL/min (by C-G formula based on SCr of 0.61 mg/dL). Recent Labs  Lab 04/01/24 1636 04/01/24 1708 04/01/24 1919 04/01/24 2136 04/02/24 0259  WBC 8.9  --   --  12.2* 11.1*  LATICACIDVEN  --  2.1* 1.1  --   --     Liver Function Tests: Recent Labs  Lab 04/01/24 1636  AST 21  ALT 45*  ALKPHOS 81  BILITOT 0.4  PROT 5.1*  ALBUMIN  3.2*   Recent Labs  Lab 04/01/24 2136  LIPASE 25  AMYLASE 17*   No results for input(s): AMMONIA in the last 168 hours.  ABG    Component Value Date/Time   PHART 7.421 04/02/2024 0630   PCO2ART 41.0 04/02/2024 0630   PO2ART 97 04/02/2024 0630   HCO3 26.7 04/02/2024 0630   TCO2 28 04/02/2024 0630   ACIDBASEDEF 4.0 (H) 11/26/2015 0420   O2SAT 98 04/02/2024 0630     Coagulation Profile: No results for input(s): INR, PROTIME in the last 168 hours.  Cardiac Enzymes: No results for input(s): CKTOTAL, CKMB, CKMBINDEX, TROPONINI in the last 168 hours.  HbA1C: Hgb A1c MFr Bld  Date/Time Value Ref Range Status  04/01/2024 09:36 PM 12.7 (H) 4.8 - 5.6 % Final    Comment:    (NOTE) Diagnosis of Diabetes The following HbA1c ranges recommended by the American Diabetes Association (ADA) may be used as an aid in the diagnosis of diabetes mellitus.  Hemoglobin             Suggested A1C NGSP%              Diagnosis  <5.7                   Non Diabetic  5.7-6.4                Pre-Diabetic  >6.4                   Diabetic  <7.0                   Glycemic control for                       adults with diabetes.    06/18/2016 12:40 AM 12.5 (H) 4.8 - 5.6 % Final    Comment:    (NOTE)         Pre-diabetes:  5.7 - 6.4         Diabetes: >6.4         Glycemic control for adults with diabetes: <7.0     CBG: Recent Labs  Lab 04/02/24 0604 04/02/24 0801 04/02/24 0842 04/02/24 1141 04/02/24 1507  GLUCAP 145* 83 105* 240* 271*    Review of Systems:   N/a  Past Medical History:  She,  has a past medical history of Angina pectoris (HCC), Anxiety, Arthritis, Arthritis, Asthma, CAD (coronary artery disease), CAD, multiple vessel, Cardiac arrest (HCC) (11/25/2015), Chest tightness, CHF (congestive heart failure) (HCC), Chronic lower back pain, Chronic shoulder pain, COPD (chronic obstructive pulmonary disease) (HCC), Coronary artery disease, Depression, Diabetes mellitus with complication (HCC), Diabetes mellitus without complication (HCC), Essential hypertension (05/20/2018), GERD (gastroesophageal reflux disease), Headache, History of blood transfusion, History of hiatal hernia, History of stomach ulcers, Hyperlipidemia, Hypothyroidism, IDDM (insulin  dependent diabetes mellitus), Incidental lung nodule, > 3mm and < 8mm (11/22/2014), Myocardial infarction (HCC) (11/2013), Neuropathy, Neuropathy due to secondary diabetes (HCC), and Wears glasses.   Surgical History:   Past Surgical History:  Procedure Laterality Date   ABDOMINAL HYSTERECTOMY  1990's   ABDOMINAL HYSTERECTOMY     APPLICATION OF WOUND VAC N/A 11/23/2014   Procedure: POSSIBLE APPLICATION OF WOUND VAC;  Surgeon: Sudie VEAR Laine, MD;  Location: [facility] OR;  Service: Thoracic;  Laterality: N/A;   BREAST SURGERY Left 1990's   breast duct surgery   CARDIAC CATHETERIZATION  09/01/14   CARDIAC CATHETERIZATION N/A 11/25/2015   Procedure: Left Heart Cath and Cors/Grafts Angiography;  Surgeon: Ozell Fell, MD;  Location: [facility] INVASIVE CV LAB;  Service: Cardiovascular;  Laterality: N/A;   CARDIAC CATHETERIZATION N/A  11/25/2015   Procedure: Coronary Stent Intervention;  Surgeon: Ozell Fell, MD;  Location: [facility] INVASIVE CV LAB;  Service:  Cardiovascular;  Laterality: N/A;   CARDIAC CATHETERIZATION N/A 06/19/2016   Procedure: Left Heart Cath and Cors/Grafts Angiography;  Surgeon: Peter M Swaziland, MD;  Location: [facility] INVASIVE CV LAB;  Service: Cardiovascular;  Laterality: N/A;   CARDIAC SURGERY     CESAREAN SECTION  1982; 1984   CESAREAN SECTION     COLONOSCOPY     CORONARY ANGIOPLASTY WITH STENT PLACEMENT  11/11/2013   Drug-eluting stent in LCx   CORONARY ARTERY BYPASS GRAFT N/A 09/22/2014   Procedure: CORONARY ARTERY BYPASS GRAFTING (CABG);  Surgeon: Sudie VEAR Laine, MD;  Location: [facility] OR;  Service: Open Heart Surgery;  Laterality: N/A;  Times 2 using left internal mammary artery and endoscopically harvested right saphenous vein   ESOPHAGOGASTRODUODENOSCOPY     GASTRIC RESECTION  1990's   bezoar; for reflux   LAPAROSCOPIC CHOLECYSTECTOMY  2000   STERNAL WOUND DEBRIDEMENT N/A 11/23/2014   Procedure: SUPERFICIAL STERNAL WOUND DEBRIDEMENT;  Surgeon: Sudie VEAR Laine, MD;  Location: [facility] OR;  Service: Thoracic;  Laterality: N/A;   TEE WITHOUT CARDIOVERSION N/A 09/22/2014   Procedure: TRANSESOPHAGEAL ECHOCARDIOGRAM (TEE);  Surgeon: Sudie VEAR Laine, MD;  Location: [facility] OR;  Service: Open Heart Surgery;  Laterality: N/A;     Social History:   reports that she has been smoking cigarettes. She has a 82 pack-year smoking history. She has never used smokeless tobacco. She reports that she does not currently use alcohol. She reports that she does not use drugs.   Family History:  Her She was adopted. Family history is unknown by patient.   Allergies Allergies  Allergen Reactions   Wellbutrin [Bupropion] Other (See Comments) and Palpitations    Makes me feel like im having a heart attack SEVERE CHEST PAIN     Home Medications  Prior to Admission medications   Medication Sig Start Date End Date Taking? Authorizing Provider  albuterol  (PROVENTIL  HFA;VENTOLIN  HFA) 108 (90 BASE) MCG/ACT inhaler Inhale 2 puffs into the lungs every 6 (six)  hours as needed for wheezing or shortness of breath.    [provider]  atorvastatin  (LIPITOR ) 80 MG tablet Take 1 tablet (80 mg total) by mouth daily. 01/10/24   Fell Ozell, MD  carvedilol  (COREG ) 3.125 MG tablet Take 1 tablet (3.125 mg total) by mouth 2 (two) times daily with a meal. 01/10/24   Fell Ozell, MD  Cholecalciferol (VITAMIN D3) 1.25 MG (50000 UT) CAPS Take 1 capsule by mouth daily. 01/31/21   [provider]  clopidogrel  (PLAVIX ) 75 MG tablet Take 1 tablet (75 mg total) by mouth daily. 01/10/24   Fell Ozell, MD  dapagliflozin propanediol (FARXIGA) 10 MG TABS tablet Take 10 mg by mouth daily. 01/22/20   [provider]  diphenhydrAMINE-APAP, sleep, (TYLENOL  PM EXTRA STRENGTH PO) Take by mouth at bedtime as needed.    [provider]  fenofibrate  54 MG tablet Take 54 mg by mouth daily.    [provider]  Fluticasone-Salmeterol (ADVAIR) 100-50 MCG/DOSE AEPB Inhale 1 puff into the lungs 2 (two) times daily as needed (shortness of breath).     [provider]  gabapentin  (NEURONTIN ) 100 MG capsule Take 100 mg by mouth daily with breakfast. 04/15/20   [provider]  insulin  aspart (NOVOLOG ) 100 UNIT/ML FlexPen Inject 0-5 Units into the skin daily. Based on sliding scale    [provider]  Insulin  Glargine-yfgn 100  UNIT/ML SOPN Take 40 Units by mouth 2 (two) times daily.    [provider]  isosorbide  mononitrate (IMDUR ) 60 MG 24 hr tablet Take 1 tablet (60 mg total) by mouth daily. 01/10/24   Wonda Sharper, MD  lisinopril  (ZESTRIL ) 2.5 MG tablet Take 1 tablet (2.5 mg total) by mouth daily. 01/10/24   Cooper, Michael, MD  meclizine (ANTIVERT) 25 MG tablet Take 25 mg by mouth at bedtime.    [provider]  methocarbamol (ROBAXIN) 750 MG tablet SMARTSIG:1 Tablet(s) By Mouth Every 12 Hours 07/14/21   [provider]  nitroGLYCERIN  (NITROSTAT ) 0.4 MG SL tablet DISSOLVE ONE TABLET UNDER THE  TONGUE EVERY 5 MINUTES AS NEEDED FOR CHEST PAIN.  DO NOT EXCEED A TOTAL OF 3 DOSES IN 15 MINUTES NOW 04/17/23   Wonda Sharper, MD  ondansetron  (ZOFRAN ) 8 MG tablet Take 8 mg by mouth every 8 (eight) hours as needed. 06/08/21   [provider]  oxycodone  (ROXICODONE ) 30 MG immediate release tablet Take 15 mg by mouth 4 (four) times daily. 07/10/21   [provider]  OXYCODONE  ER PO Take 1 tablet by mouth every 8 (eight) hours as needed (PAIN).     [provider]  promethazine  (PHENERGAN ) 12.5 MG tablet Take 12.5 mg by mouth every 8 (eight) hours as needed. 07/14/21   [provider]  promethazine  (PHENERGAN ) 25 MG tablet Take 1 tablet (25 mg total) by mouth every 6 (six) hours as needed for nausea or vomiting. 03/26/24   Kammerer, Megan L, DO  tiotropium (SPIRIVA ) 18 MCG inhalation capsule Place 18 mcg into inhaler and inhale daily as needed (shortness of breath).     [provider]     Critical care time:      CRITICAL CARE Performed by: Donnice JONELLE Beals   Total critical care time: 32 minutes  Critical care time was exclusive of separately billable procedures and treating other patients.  Critical care was necessary to treat or prevent imminent or life-threatening deterioration.  Critical care was time spent personally by me on the following activities: development of treatment plan with patient and/or surrogate as well as nursing, discussions with consultants, evaluation of patient's response to treatment, examination of patient, obtaining history from patient or surrogate, ordering and performing treatments and interventions, ordering and review of laboratory studies, ordering and review of radiographic studies, pulse oximetry and re-evaluation of patient's condition.   Donnice JONELLE Beals, MD See TRACEY

## 2024-04-03 ENCOUNTER — Other Ambulatory Visit (HOSPITAL_COMMUNITY): Payer: Self-pay

## 2024-04-03 DIAGNOSIS — R55 Syncope and collapse: Secondary | ICD-10-CM | POA: Diagnosis not present

## 2024-04-03 LAB — BASIC METABOLIC PANEL WITH GFR
Anion gap: 7 (ref 5–15)
BUN: 9 mg/dL (ref 8–23)
CO2: 26 mmol/L (ref 22–32)
Calcium: 8.8 mg/dL — ABNORMAL LOW (ref 8.9–10.3)
Chloride: 106 mmol/L (ref 98–111)
Creatinine, Ser: 0.64 mg/dL (ref 0.44–1.00)
GFR, Estimated: >60 mL/min (ref >60)
Glucose, Bld: 109 mg/dL — ABNORMAL HIGH (ref 70–99)
Potassium: 3.8 mmol/L (ref 3.5–5.1)
Sodium: 139 mmol/L (ref 135–145)

## 2024-04-03 LAB — GLUCOSE, CAPILLARY
Glucose-Capillary: 106 mg/dL — ABNORMAL HIGH (ref 70–99)
Glucose-Capillary: 145 mg/dL — ABNORMAL HIGH (ref 70–99)
Glucose-Capillary: 181 mg/dL — ABNORMAL HIGH (ref 70–99)
Glucose-Capillary: 213 mg/dL — ABNORMAL HIGH (ref 70–99)
Glucose-Capillary: 221 mg/dL — ABNORMAL HIGH (ref 70–99)
Glucose-Capillary: 78 mg/dL (ref 70–99)

## 2024-04-03 LAB — URINE CULTURE: Culture: NO GROWTH

## 2024-04-03 NOTE — TOC Initial Note (Signed)
 [facility]) - Initial/Assessment Note    Patient Details  Name: Sara Hobbs MRN: [id number] Date of Birth: 03-27-1961  [facility] CM/SW Contact:    Roxie KANDICE Stain, RN Phone Number: 04/03/2024, 12:02 PM  Clinical Narrative:                 Spoke to patient's daughter per patient request regarding medication assistance. Daughter, Sara Hobbs, states patient's insulin  is too expensive. This RNCM sent request to Doctor who added diabetic coordinator consult to possibly change the insulin  to be more affordable.  TOC will continue to follow for needs.    Expected Discharge Plan: Home w Home Health Services Barriers to Discharge: Continued Medical Work up   Patient Goals and CMS Choice Patient states their goals for this hospitalization and ongoing recovery are:: return home          Expected Discharge Plan and Services       Living arrangements for the past 2 months: Single Family Home                                      Prior Living Arrangements/Services Living arrangements for the past 2 months: Single Family Home   Patient language and need for interpreter reviewed:: Yes Do you feel safe going back to the place where you live?: Yes      Need for Family Participation in Patient Care: Yes (Comment) Care giver support system in place?: Yes (comment)   Criminal Activity/Legal Involvement Pertinent to Current Situation/Hospitalization: No - Comment as needed  Activities of Daily Living   ADL Screening (condition at time of admission) Independently performs ADLs?: No Does the patient have a NEW difficulty with bathing/dressing/toileting/self-feeding that is expected to last >3 days?: Yes (Initiates electronic notice to provider for possible OT consult) Does the patient have a NEW difficulty with getting in/out of bed, walking, or climbing stairs that is expected to last >3 days?: Yes (Initiates electronic notice to provider for possible  PT consult) Does the patient have a NEW difficulty with communication that is expected to last >3 days?: No Is the patient deaf or have difficulty hearing?: Yes Does the patient have difficulty seeing, even when wearing glasses/contacts?: No Does the patient have difficulty concentrating, remembering, or making decisions?: Yes  Permission Sought/Granted                  Emotional Assessment Appearance:: Appears stated age   Affect (typically observed): Accepting Orientation: : Oriented to Self, Oriented to  Time, Oriented to Place, Oriented to Situation Alcohol / Substance Use: Not Applicable Psych Involvement: No (comment)  Admission diagnosis:  Syncope [R55] Hypotension, unspecified hypotension type [I95.9] Syncope, unspecified syncope type [R55] Patient Active Problem List   Diagnosis Date Noted   Syncope 04/01/2024   Polycythemia, secondary 03/16/2021   Essential hypertension 05/20/2018   Tobacco abuse 05/20/2018   Chest pain 06/17/2016   Chest pain with moderate risk of acute coronary syndrome 04/21/2016   Uncontrolled insulin  dependent diabetes mellitus    Acute respiratory failure with hypoxia (HCC)    Cardiac arrest (HCC) 11/25/2015   PEA (Pulseless electrical activity) (HCC) 11/25/2015   Respiratory failure (HCC) 11/25/2015   Wound cellulitis 12/13/2014   Wound infection 11/23/2014   Cellulitis of sternum 11/22/2014   Incidental lung nodule, > 3mm and < 8mm 11/22/2014   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  Arthritis    Back pain, chronic    Chronic shoulder pain    COPD (chronic obstructive pulmonary disease) (HCC)    Diabetes mellitus with complication (HCC)    Coronary artery disease involving native coronary artery of native heart with angina pectoris (HCC)    Hyperlipidemia LDL goal <70    GERD (gastroesophageal reflux disease)    Chest tightness    Angina pectoris (HCC)    H/O heart artery stent 11/01/2013   PCP:  Dsa, Joylin G, MD Pharmacy:   [facility] [address] [place], [place] - [address] [place] [address] Phone: [phone] Fax: [phone]  [facility] [address] [place] - [address] [place] [address] Phone: [phone] Fax: [phone]  [id number] [place], [place] - [address] AT CORNER OF [facility] [address] [place] [address] Phone: [phone] Fax: [phone]  [id number] - [place], [place] - [address] [place] [address] Phone: [phone] Fax: [phone]  [facility] - [place], [place] - [address] [place] [address] Phone: [phone] Fax: [phone]     Social Drivers of Health (SDOH) Social History: SDOH Screenings   Food Insecurity: No Food Insecurity (04/02/2024)  Housing: Low Risk  (04/02/2024)  Transportation Needs: No Transportation Needs (04/02/2024)  Utilities: Not At Risk (04/02/2024)  Financial Resource Strain: Low Risk  (09/13/2023)   Received from [facility]  Social Connections: Unknown (01/04/2022)   Received from [facility]  Tobacco Use: High Risk (04/01/2024)   SDOH Interventions:     Readmission Risk Interventions     No data to display

## 2024-04-03 NOTE — Evaluation (Signed)
 Physical Therapy Evaluation Patient Details Name: Sara Hobbs MRN: [id number] DOB: 03/30/1961 Today's Date: 04/03/2024  History of Present Illness  Pt is a 63 year old woman sent from cardiologist's office on 04/02/24 with syncope/presyncope. + hypotension and CBG of 515. Fall at home 7/24 resulting in R 5th MT fx. PMH: DM2 with medical noncompliance, CAD, CABG, PEA arrest, COPD, cardiomyopathy, HTN, HLD, polycythemia, CHF, MDD, chronic back pain, recent R 5th metarsal fx.   Clinical Impression  Pt admitted with above diagnosis. PTA pt lived at home with family, independent household mobility without AD. Pt currently with functional limitations due to the deficits listed below (see PT Problem List). On eval, pt required supervision bed mobility, min assist sit to stand, and min assist SPT. Unable to progress amb due to symptomatic orthostatic hypotension in stand. Pt will benefit from acute skilled PT to increase their independence and safety with mobility to allow discharge. PT to follow acutely. Anticipate no follow needs upon d/c.                                      BP               HR Supine                     130/46             83 Sit                             113/73             91 Stand                         64/34             95 Return to sit             106/70            91         If plan is discharge home, recommend the following: Assistance with cooking/housework;Assist for transportation;Help with stairs or ramp for entrance   Can travel by private vehicle        Equipment Recommendations None recommended by PT  Recommendations for Other Services       Functional Status Assessment Patient has had a recent decline in their functional status and demonstrates the ability to make significant improvements in function in a reasonable and predictable amount of time.     Precautions / Restrictions Precautions Precautions: Fall;Other (comment) Recall of  Precautions/Restrictions: Intact Precaution/Restrictions Comments: watch BP (+orthostatics on eval) Required Braces or Orthoses: Other Brace Other Brace: R post op shoe due to 5th MT fx      Mobility  Bed Mobility Overal bed mobility: Needs Assistance Bed Mobility: Supine to Sit     Supine to sit: Supervision          Transfers Overall transfer level: Needs assistance Equipment used: 1 person hand held assist Transfers: Sit to/from Stand, Bed to chair/wheelchair/BSC Sit to Stand: Min assist   Step pivot transfers: Min assist       General transfer comment: pivot transfer bed>BSC>recliner    Ambulation/Gait               General Gait Details: unable to progress due  to +orthostatics  Stairs            Wheelchair Mobility     Tilt Bed    Modified Rankin (Stroke Patients Only)       Balance Overall balance assessment: Needs assistance   Sitting balance-Leahy Scale: Good     Standing balance support: During functional activity Standing balance-Leahy Scale: Poor Standing balance comment: reliant on external assist                             Pertinent Vitals/Pain Pain Assessment Pain Assessment: Faces Faces Pain Scale: No hurt    Home Living Family/patient expects to be discharged to:: Private residence Living Arrangements: Children;Other relatives Available Help at Discharge: Family;Available 24 hours/day Type of Home: House Home Access: Stairs to enter Entrance Stairs-Rails: None Entrance Stairs-Number of Steps: 5   Home Layout: One level Home Equipment: None      Prior Function Prior Level of Function : Independent/Modified Independent             Mobility Comments: no AD ADLs Comments: sponge bathes, daughter gets groceries     Extremity/Trunk Assessment   Upper Extremity Assessment Upper Extremity Assessment: Overall WFL for tasks assessed    Lower Extremity Assessment Lower Extremity Assessment:  Overall WFL for tasks assessed    Cervical / Trunk Assessment Cervical / Trunk Assessment: Normal  Communication   Communication Communication: Impaired Factors Affecting Communication: Other (comment) (low volume)    Cognition Arousal: Alert Behavior During Therapy: WFL for tasks assessed/performed   PT - Cognitive impairments: No apparent impairments                         Following commands: Intact       Cueing Cueing Techniques: Verbal cues     General Comments      Exercises     Assessment/Plan    PT Assessment Patient needs continued PT services  PT Problem List Cardiopulmonary status limiting activity;Decreased activity tolerance;Decreased balance;Decreased mobility       PT Treatment Interventions Therapeutic exercise;Gait training;Balance training;Stair training;Functional mobility training;Therapeutic activities;Patient/family education    PT Goals (Current goals can be found in the Care Plan section)  Acute Rehab PT Goals Patient Stated Goal: home, independence PT Goal Formulation: With patient Time For Goal Achievement: 04/17/24 Potential to Achieve Goals: Good    Frequency Min 2X/week     Co-evaluation   Reason for Co-Treatment: For patient/therapist safety (orthostatic)   OT goals addressed during session: ADL's and self-care       AM-PAC PT 6 Clicks Mobility  Outcome Measure Help needed turning from your back to your side while in a flat bed without using bedrails?: None Help needed moving from lying on your back to sitting on the side of a flat bed without using bedrails?: A Little Help needed moving to and from a bed to a chair (including a wheelchair)?: A Little Help needed standing up from a chair using your arms (e.g., wheelchair or bedside chair)?: A Little Help needed to walk in hospital room?: A Little Help needed climbing 3-5 steps with a railing? : A Little 6 Click Score: 19    End of Session   Activity  Tolerance: Treatment limited secondary to medical complications (Comment) (+orthostatics) Patient left: in chair;with chair alarm set;with call bell/phone within reach Nurse Communication: Mobility status;Other (comment) (BP) PT Visit Diagnosis: Difficulty in walking, not elsewhere classified (  R26.2)    Time: 9187-9152 PT Time Calculation (min) (ACUTE ONLY): 35 min   Charges:   PT Evaluation $PT Eval Moderate Complexity: 1 Mod   PT General Charges $$ ACUTE PT VISIT: 1 Visit         Sari MATSU., PT  Office # [phone]   Erven Sari Shaker 04/03/2024, 10:25 AM

## 2024-04-03 NOTE — Evaluation (Signed)
 Occupational Therapy Evaluation Patient Details Name: Sara Hobbs MRN: [id number] DOB: April 27, 1961 Today's Date: 04/03/2024   History of Present Illness   Pt is a 63 year old woman sent from cardiologist's office on 04/02/24 with syncope/presyncope. + hypotension and CBG of 515. PMH: DM2 with medical noncompliance, CAD, CABG, PEA arrest, COPD, cardiomyopathy, HTN, HLD, polycythemia, CHF, MDD, chronic back pain, recent R 5th metarsal fx.     Clinical Impressions Pt was independent prior to admission in self care and ambulation. She sponge bathes due to fear of fall transferring into tub. Pt resides with her daughter and grandchildren who assist with IADLs and are available 24 hours. Pt presents with symptomatic orthostatic hypotension upon standing (see flow sheet for specifics). She requires min assist for OOB to [facility] and recliner (hand held). Pt needs set up to min assist for ADLs. Will follow acutely. Do not anticipate pt will need post acute OT once medical conditions are resolved.      If plan is discharge home, recommend the following:   A little help with walking and/or transfers;A little help with bathing/dressing/bathroom;Assistance with cooking/housework;Assist for transportation;Help with stairs or ramp for entrance     Functional Status Assessment   Patient has had a recent decline in their functional status and demonstrates the ability to make significant improvements in function in a reasonable and predictable amount of time.     Equipment Recommendations   Other (comment) (TBD)     Recommendations for Other Services         Precautions/Restrictions   Precautions Precautions: Fall Precaution/Restrictions Comments: watch BP Required Braces or Orthoses: Other Brace Other Brace: R post op shoe Restrictions Weight Bearing Restrictions Per Provider Order: No     Mobility Bed Mobility Overal bed mobility: Needs Assistance Bed Mobility: Supine to Sit      Supine to sit: Supervision     General bed mobility comments: assist for lines    Transfers Overall transfer level: Needs assistance Equipment used: 1 person hand held assist Transfers: Sit to/from Stand, Bed to chair/wheelchair/BSC Sit to Stand: Min assist     Step pivot transfers: Min assist     General transfer comment: steadying assist, pt with symptomatic hypotension in standing      Balance Overall balance assessment: Needs assistance   Sitting balance-Leahy Scale: Good       Standing balance-Leahy Scale: Poor                             ADL either performed or assessed with clinical judgement   ADL Overall ADL's : Needs assistance/impaired Eating/Feeding: Independent;Sitting   Grooming: Set up;Sitting   Upper Body Bathing: Set up;Sitting   Lower Body Bathing: Minimal assistance;Sit to/from stand   Upper Body Dressing : Set up;Sitting   Lower Body Dressing: Minimal assistance;Sit to/from stand   Toilet Transfer: Minimal assistance;Stand-pivot   Toileting- Clothing Manipulation and Hygiene: Total assistance;Sit to/from stand       Functional mobility during ADLs: Minimal assistance       Vision Ability to See in Adequate Light: 0 Adequate Patient Visual Report: No change from baseline       Perception         Praxis         Pertinent Vitals/Pain Pain Assessment Pain Assessment: Faces Faces Pain Scale: No hurt     Extremity/Trunk Assessment Upper Extremity Assessment Upper Extremity Assessment: Overall WFL for tasks assessed   Lower  Extremity Assessment Lower Extremity Assessment: Defer to PT evaluation   Cervical / Trunk Assessment Cervical / Trunk Assessment: Normal;Other exceptions (chronic back pain)   Communication Communication Communication: Impaired Factors Affecting Communication: Other (comment) (low volume)   Cognition Arousal: Alert Behavior During Therapy: WFL for tasks assessed/performed Cognition:  No apparent impairments                               Following commands: Intact       Cueing  General Comments   Cueing Techniques: Verbal cues      Exercises     Shoulder Instructions      Home Living Family/patient expects to be discharged to:: Private residence Living Arrangements: Children;Other relatives (daughter, grandchildren) Available Help at Discharge: Family;Available 24 hours/day Type of Home: House Home Access: Stairs to enter [employer] of Steps: 5 Entrance Stairs-Rails: None Home Layout: One level     Bathroom Shower/Tub: Tub/shower unit;Sponge bathes at baseline   [employer]: Standard     Home Equipment: None          Prior Functioning/Environment Prior Level of Function : Independent/Modified Independent             Mobility Comments: no AD ADLs Comments: sponge bathes, daughter gets groceries    OT Problem List: Impaired balance (sitting and/or standing)   OT Treatment/Interventions: Self-care/ADL training;Therapeutic activities;DME and/or AE instruction;Patient/family education;Balance training      OT Goals(Current goals can be found in the care plan section)   Acute Rehab OT Goals OT Goal Formulation: With patient Time For Goal Achievement: 04/17/24 Potential to Achieve Goals: Good ADL Goals Pt Will Perform Grooming: with modified independence;standing Pt Will Perform Lower Body Bathing: with modified independence;sit to/from stand Pt Will Perform Lower Body Dressing: with modified independence;sit to/from stand Pt Will Transfer to Toilet: with modified independence;ambulating;regular height toilet Pt Will Perform Toileting - Clothing Manipulation and hygiene: with modified independence;sit to/from stand Additional ADL Goal #1: Pt will be aware of availability and use of tub transfer bench.   OT Frequency:  Min 2X/week    Co-evaluation PT/OT/SLP Co-Evaluation/Treatment: Yes Reason for  Co-Treatment: For patient/therapist safety (orthostatic)   OT goals addressed during session: ADL's and self-care      AM-PAC OT 6 Clicks Daily Activity     Outcome Measure Help from another person eating meals?: None Help from another person taking care of personal grooming?: A Little Help from another person toileting, which includes using toliet, bedpan, or urinal?: A Little Help from another person bathing (including washing, rinsing, drying)?: A Little Help from another person to put on and taking off regular upper body clothing?: None Help from another person to put on and taking off regular lower body clothing?: A Little 6 Click Score: 20   End of Session Equipment Utilized During Treatment: Gait belt Nurse Communication: Mobility status;Other (comment) (aware of BPs)  Activity Tolerance: Patient tolerated treatment well Patient left: in chair;with call bell/phone within reach;with chair alarm set  OT Visit Diagnosis: Unsteadiness on feet (R26.81);Dizziness and giddiness (R42)                Time: 9187-9152 OT Time Calculation (min): 35 min Charges:  OT General Charges $OT Visit: 1 Visit OT Evaluation $OT Eval Moderate Complexity: 1 Mod Mliss HERO, OTR/L Acute Rehabilitation Services Office: [phone]   Kennth Mliss Helling 04/03/2024, 9:00 AM

## 2024-04-03 NOTE — Inpatient Diabetes Management (Addendum)
 Inpatient Diabetes Program Recommendations  AACE/ADA: New Consensus Statement on Inpatient Glycemic Control (2015)  Target Ranges:  Prepandial:   less than 140 mg/dL      Peak postprandial:   less than 180 mg/dL (1-2 hours)      Critically ill patients:  140 - 180 mg/dL   Lab Results  Component Value Date   GLUCAP 221 (H) 04/03/2024   HGBA1C 12.7 (H) 04/01/2024    Review of Glycemic Control  Diabetes history: DM2 Outpatient Diabetes medications: Glargine 40 units BID, Novolog  0-5 units SSI TID (not taking insulin  at home due to cost), Farxiga 10 mg daily Current orders for Inpatient glycemic control: Semglee  35 units daily, Novolog  0-15 units correction scale TID, HS scale  Inpatient Diabetes Program Recommendations:   Spoke with patient at the bedside. Patient was diagnosed with diabetes in 2000 and started on insulin  in 2002. She sees an NP as her PCP and last saw them about 3 weeks ago. States that she lives with her daughter and grandchildren. She states that she has not been taking her insulin  because she cannot afford it. Discussed her HgbA1C of 12.7%; she knows that it is high, especially she is not taking her insulin . States that she does not check her blood sugars; she states that she goes by how she feels and what she eats when she does take her Novolog . Encouraged patient to take her insulin ; would work on trying to get an affordable insulin  for her.   Asked pharmacy to run a benefit check on basal and bolus insulin  with this patient's insurance. The response was: Test claims are not currently going through for pt due to non payment of premium .  Spoke with daughter, Clotilda, on the phone about her mother's care. She states that she has been charged $300-$600 for the insulin  pens and cannot afford it. Recommended that she get the [employer] insulin  pens for $42.88. Thinsulin  would need to 70/30 mix to be given twice per day. She will need a new blood glucose meter,  strips, and lancets: recommend the [employer] meter and strips. Patient needs to be reminded to take her insulin  and check blood sugars; daughter states that her son and daughter are there with her during the day and can remind her.   Recommend [employer] insulin  70/30 mix 20 units BID with meals. (This would be equal to Semglee  30 units daily that she is on at the hospital.)  Will continue to monitor blood sugars while in the hospital.  Marjorie Lunger RN BSN CDE Diabetes Coordinator Pager: [phone]  8am-5pm

## 2024-04-03 NOTE — Progress Notes (Addendum)
 NAME:  Sara Hobbs, MRN:  [id number], DOB:  [birth year], LOS: 2 ADMISSION DATE:  04/01/2024, CONSULTATION DATE:  7/30 REFERRING MD:  Kommor, CHIEF COMPLAINT:  syncope and shock   History of Present Illness:  63 year old female patient with a complicated medical history including coronary artery disease dating back to 2017 with prior drug-eluting stent to the circumflex.  Ischemic cardiomyopathy with a EF 55 to 60% and grade 1 diastolic dysfunction, as well as trivial MR, tobacco abuse, COPD  Pertinent  Medical History  Copd, tobacco abuse, Major depression w/ psychotic features., CAD, CABG, PEA arrest 3/17, s/p DES to LCX, CM, DM, neuropathy, HTN, HLD, gerd, polycythemia  Recently seen in the emergency room on 7/24 after a fall where she sustained a right fifth metatarsal fracture. Presented to the emergency room via cardiology office where she was scheduled for cardiology follow-up in regards to her coronary artery disease. On arrival the patient was diaphoretic and lethargic.  She had brief loss of consciousness.  Systolic blood pressure in the 60s endorsing shortness of breath but no chest pain.  She was placed in the supine position with mild improvement in her blood pressure room air pulse oximetry 90 did endorse nausea and vomiting the day prior.  She has had no fever, no new cough, denies chest pain, sore throat, sick exposure.  Had some nausea and vomiting yesterday, 7/29 but no abdominal discomfort.  Has chronic back pain, denies dysuria or change in urine smell or appearance.  She does not check her blood glucose, has not been on her insulin  for over a year.  In regards to her fall she recalls that this was after getting lightheaded, she did not trip.  This happens from time to time she reports.  She has had persistent right lower extremity discomfort since the fall  EMS was called initial blood glucose 515, blood pressure on arrival 78/58 heart rate 70s lethargic administered 500  crystalloid with no significant clinical Change subsequently transferred to the emergency room.  ER evaluation: Capillary blood glucose of 448 sodium 134 potassium 3.3 bicarbonate 27 creatinine 1.04 white blood cell count 8.9 hemoglobin 12.1 hematocrit 35 platelet count 166, initial troponin of 6, initial lactate 2.1. Received 2 L of lactated Ringer 's with minimal improvement in blood pressure so was started on norepinephrine  of note patient did begin to exhibit dark black stools while in ER.  Because of persistent hypotension critical care was asked to admit  Significant Hospital Events: Including procedures, antibiotic start and stop dates in addition to other pertinent events   7/30 presented to the emergency room via cardiology office after brief syncopal episode and found to have systolic blood pressure in the 70s.  Initial glucose 148, normal bicarbonate, hemoglobin 12.1, initial lactate 10.1.  Has had a fall approximately 1 week prior with ongoing right lower extremity pain.  Critical care asked to admit after patient continued to be hypotensive following 2 L of crystalloid 8/1: Off Levophed    Interim History / Subjective:  Patient is evaluated bedside, awake, alert, answering questions appropriately.  Patient reports she has a chronic cough that is not worsening, reports intermittent dizzy spells with changes in position from laying to sitting up, denies any nausea, vomiting, abdominal pain, diarrhea.  Patient is currently on room air, off of Levophed , maintaining MAP above 65.   Objective    Blood pressure (!) 130/46, pulse 86, temperature 97.6 F (36.4 C), temperature source Oral, resp. rate 13, height 5' 1 (1.549 m), weight 76.7  kg, SpO2 99%.        Intake/Output Summary (Last 24 hours) at 04/03/2024 0929 Last data filed at 04/03/2024 0800 Gross per 24 hour  Intake 1739.95 ml  Output 1600 ml  Net 139.95 ml   Filed Weights   04/01/24 1627 04/02/24 0428 04/03/24 0500  Weight:  73.5 kg 74 kg 76.7 kg    Examination: General: Appears chronically ill, sitting up in bed, no apparent distress Eyes: EOMI, icterus Abdomen: Soft, nontender, nondistended MSK: No lower extremity edema Pulmonary: Breathing comfortably, no wheezing or crackles noted Cardiovascular: Regular rate and rhythm Neuro: Awake, alert, answering questions appropriately, without focal deficits  Resolved problem list  None  Assessment and Plan   Syncope versus presyncope Likely secondary to orthostatic hypotension in the setting of hypovolemia from GI losses and decreased p.o. intake.  Status post fluid/volume resuscitation.  TTE reassuring with LVEF 60 to 65%, no RWMA, normal diastolic function, normal RV function. - PT/OT - Encourage PO hydration   Orthostatic Hypotension 2/2 hypovolemia Reports that she had preceding episodes of nausea and vomiting and chronic diarrhea for several months.  CT imaging noted for colitis. On evaluation today, patient denies any abdominal pain, nausea, vomiting.  Denies any diarrhea.  Tolerating p.o. well.  No signs of infection, urinalysis within normal limits.  Chest x-ray and CT CAP wo infectious etiology. BMP this morning wo any abnormalities, low concern for adrenal disorders.  - Levophed  turned off this morning, she is able to maintain her MAP above 65 - OT this am noted markedly orthostatic hypotension from sitting to standing SBP dropped ~40 mmhg  - Encourage p.o. hydration - PT/OT - Safe to transfer to progressive   Type 2 diabetes with hyperglycemia A1c 12.7, checked on 04/01/24; she was previously on insulin , stopped taking them over a year ago. Presented with CBG ~400. IV insulin , now transition to SQ.  - CBG this AM 106, if another low CBG < 120, can consider splitting the dose Semglee  to 15 units BID  - Continue Semglee  30 units daily, SSI moderate - Every 4 hours of CBG  History of hypertension CAD w/ prior DES to circumflex, NICM   Diastolic  heart failure with EF 55 to 60% Severe MVR TTE reassuring with LVEF 60 to 65%, no RWMA, normal diastolic parameters, normal RV function.  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 - Held Coreg , isosorbide , Zestril  - Continue Plavix  and Lipitor   Hx of COPD Home medication consist of Spiriva  and Advair.  Denies any worsening of cough or shortness of breath from baseline.  Currently on room air.   - Continue triple combo nebs, can be transitioned to home inhalers  - Supplemental oxygen for sats greater than 90  History of diabetic neuropathy - Neurontin    History of hyperlipidemia - Continue atorvastatin  and fenofibrate   Acute nondisplaced fracture of the fifth right metatarsal - Continue with immobilizer PRN apap  Best practice: Diet/type: Regular consistency (see orders) DVT prophylaxis: Lovenox  Sub Q Pressure ulcer(s): N/A GI prophylaxis: H2B Lines: N/A Foley:  N/A Code Status:  full code Last date of multidisciplinary goals of care discussion [pending]  Labs   CBC: Recent Labs  Lab 04/01/24 1636 04/01/24 1809 04/01/24 2136 04/02/24 0259 04/02/24 0630  WBC 8.9  --  12.2* 11.1*  --   NEUTROABS 6.3  --   --   --   --   HGB 12.1 12.9 13.1 12.9 12.2  HCT 35.0* 36.7 37.5 36.4 36.0  MCV 91.6  --  91.9 91.5  --   PLT 166  --  175 175  --     Basic Metabolic Panel: Recent Labs  Lab 04/01/24 1636 04/01/24 2136 04/02/24 0259 04/02/24 0630 04/03/24 0400  NA 134*  --  141 142 139  K 3.3*  --  4.0 3.3* 3.8  CL 97*  --  107  --  106  CO2 27  --  27  --  26  GLUCOSE 472*  --  123*  --  109*  BUN 17  --  11  --  9  CREATININE 1.04*  --  0.61  --  0.64  CALCIUM  8.5*  --  8.8*  --  8.8*  MG  --   --  1.5*  --   --   PHOS  --  3.1 2.6  --   --    GFR: Estimated Creatinine Clearance: 68.4 mL/min (by C-G formula based on SCr of 0.64 mg/dL). Recent Labs  Lab 04/01/24 1636 04/01/24 1708 04/01/24 1919 04/01/24 2136 04/02/24 0259  WBC 8.9  --   --  12.2* 11.1*  LATICACIDVEN  --   2.1* 1.1  --   --     Liver Function Tests: Recent Labs  Lab 04/01/24 1636  AST 21  ALT 45*  ALKPHOS 81  BILITOT 0.4  PROT 5.1*  ALBUMIN  3.2*   Recent Labs  Lab 04/01/24 2136  LIPASE 25  AMYLASE 17*   No results for input(s): AMMONIA in the last 168 hours.  ABG    Component Value Date/Time   PHART 7.421 04/02/2024 0630   PCO2ART 41.0 04/02/2024 0630   PO2ART 97 04/02/2024 0630   HCO3 26.7 04/02/2024 0630   TCO2 28 04/02/2024 0630   ACIDBASEDEF 4.0 (H) 11/26/2015 0420   O2SAT 98 04/02/2024 0630     Coagulation Profile: No results for input(s): INR, PROTIME in the last 168 hours.  Cardiac Enzymes: No results for input(s): CKTOTAL, CKMB, CKMBINDEX, TROPONINI in the last 168 hours.  HbA1C: Hgb A1c MFr Bld  Date/Time Value Ref Range Status  04/01/2024 09:36 PM 12.7 (H) 4.8 - 5.6 % Final    Comment:    (NOTE) Diagnosis of Diabetes The following HbA1c ranges recommended by the American Diabetes Association (ADA) may be used as an aid in the diagnosis of diabetes mellitus.  Hemoglobin             Suggested A1C NGSP%              Diagnosis  <5.7                   Non Diabetic  5.7-6.4                Pre-Diabetic  >6.4                   Diabetic  <7.0                   Glycemic control for                       adults with diabetes.    06/18/2016 12:40 AM 12.5 (H) 4.8 - 5.6 % Final    Comment:    (NOTE)         Pre-diabetes: 5.7 - 6.4         Diabetes: >6.4         Glycemic control for adults with diabetes: <7.0  CBG: Recent Labs  Lab 04/02/24 1507 04/02/24 1937 04/02/24 2304 04/03/24 0311 04/03/24 0755  GLUCAP 271* 234* 127* 106* 221*    Review of Systems:   N/a  Past Medical History:  She,  has a past medical history of Angina pectoris (HCC), Anxiety, Arthritis, Arthritis, Asthma, CAD (coronary artery disease), CAD, multiple vessel, Cardiac arrest (HCC) (11/25/2015), Chest tightness, CHF (congestive heart failure)  (HCC), Chronic lower back pain, Chronic shoulder pain, COPD (chronic obstructive pulmonary disease) (HCC), Coronary artery disease, Depression, Diabetes mellitus with complication (HCC), Diabetes mellitus without complication (HCC), Essential hypertension (05/20/2018), GERD (gastroesophageal reflux disease), Headache, History of blood transfusion, History of hiatal hernia, History of stomach ulcers, Hyperlipidemia, Hypothyroidism, IDDM (insulin  dependent diabetes mellitus), Incidental lung nodule, > 3mm and < 8mm (11/22/2014), Myocardial infarction (HCC) (11/2013), Neuropathy, Neuropathy due to secondary diabetes (HCC), and Wears glasses.   Surgical History:   Past Surgical History:  Procedure Laterality Date   ABDOMINAL HYSTERECTOMY  1990's   ABDOMINAL HYSTERECTOMY     APPLICATION OF WOUND VAC N/A 11/23/2014   Procedure: POSSIBLE APPLICATION OF WOUND VAC;  Surgeon: Sudie VEAR Laine, MD;  Location: [facility] OR;  Service: Thoracic;  Laterality: N/A;   BREAST SURGERY Left 1990's   breast duct surgery   CARDIAC CATHETERIZATION  09/01/14   CARDIAC CATHETERIZATION N/A 11/25/2015   Procedure: Left Heart Cath and Cors/Grafts Angiography;  Surgeon: Ozell Fell, MD;  Location: [facility] INVASIVE CV LAB;  Service: Cardiovascular;  Laterality: N/A;   CARDIAC CATHETERIZATION N/A 11/25/2015   Procedure: Coronary Stent Intervention;  Surgeon: Ozell Fell, MD;  Location: [facility] INVASIVE CV LAB;  Service: Cardiovascular;  Laterality: N/A;   CARDIAC CATHETERIZATION N/A 06/19/2016   Procedure: Left Heart Cath and Cors/Grafts Angiography;  Surgeon: Peter M Swaziland, MD;  Location: [facility] INVASIVE CV LAB;  Service: Cardiovascular;  Laterality: N/A;   CARDIAC SURGERY     CESAREAN SECTION  1982; 1984   CESAREAN SECTION     COLONOSCOPY     CORONARY ANGIOPLASTY WITH STENT PLACEMENT  11/11/2013   Drug-eluting stent in LCx   CORONARY ARTERY BYPASS GRAFT N/A 09/22/2014   Procedure: CORONARY ARTERY BYPASS GRAFTING (CABG);  Surgeon: Sudie VEAR Laine, MD;  Location[facility] OR;  Service: Open Heart Surgery;  Laterality: N/A;  Times 2 using left internal mammary artery and endoscopically harvested right saphenous vein   ESOPHAGOGASTRODUODENOSCOPY     GASTRIC RESECTION  1990's   bezoar; for reflux   LAPAROSCOPIC CHOLECYSTECTOMY  2000   STERNAL WOUND DEBRIDEMENT N/A 11/23/2014   Procedure: SUPERFICIAL STERNAL WOUND DEBRIDEMENT;  Surgeon: Sudie VEAR Laine, MD;  Location: [facility] OR;  Service: Thoracic;  Laterality: N/A;   TEE WITHOUT CARDIOVERSION N/A 09/22/2014   Procedure: TRANSESOPHAGEAL ECHOCARDIOGRAM (TEE);  Surgeon: Sudie VEAR Laine, MD;  Location: [facility] OR;  Service: Open Heart Surgery;  Laterality: N/A;     Social History:   reports that she has been smoking cigarettes. She has a 82 pack-year smoking history. She has never used smokeless tobacco. She reports that she does not currently use alcohol. She reports that she does not use drugs.   Family History:  Her She was adopted. Family history is unknown by patient.   Allergies Allergies  Allergen Reactions   Wellbutrin [Bupropion] Other (See Comments) and Palpitations    Makes me feel like im having a heart attack SEVERE CHEST PAIN     Home Medications  Prior to Admission medications   Medication Sig Start Date End Date Taking? Authorizing Provider  albuterol  (PROVENTIL  HFA;VENTOLIN  HFA) 108 (90 BASE) MCG/ACT inhaler Inhale 2 puffs into the lungs every 6 (six) hours as needed for wheezing or shortness of breath.    [provider]  atorvastatin  (LIPITOR ) 80 MG tablet Take 1 tablet (80 mg total) by mouth daily. 01/10/24   Wonda Sharper, MD  carvedilol  (COREG ) 3.125 MG tablet Take 1 tablet (3.125 mg total) by mouth 2 (two) times daily with a meal. 01/10/24   Wonda Sharper, MD  Cholecalciferol (VITAMIN D3) 1.25 MG (50000 UT) CAPS Take 1 capsule by mouth daily. 01/31/21   [provider]  clopidogrel  (PLAVIX ) 75 MG tablet Take 1 tablet (75 mg total) by mouth daily.  01/10/24   Wonda Sharper, MD  dapagliflozin propanediol (FARXIGA) 10 MG TABS tablet Take 10 mg by mouth daily. 01/22/20   [provider]  diphenhydrAMINE-APAP, sleep, (TYLENOL  PM EXTRA STRENGTH PO) Take by mouth at bedtime as needed.    [provider]  fenofibrate  54 MG tablet Take 54 mg by mouth daily.    [provider]  Fluticasone-Salmeterol (ADVAIR) 100-50 MCG/DOSE AEPB Inhale 1 puff into the lungs 2 (two) times daily as needed (shortness of breath).     [provider]  gabapentin  (NEURONTIN ) 100 MG capsule Take 100 mg by mouth daily with breakfast. 04/15/20   [provider]  insulin  aspart (NOVOLOG ) 100 UNIT/ML FlexPen Inject 0-5 Units into the skin daily. Based on sliding scale    [provider]  Insulin  Glargine-yfgn 100 UNIT/ML SOPN Take 40 Units by mouth 2 (two) times daily.    [provider]  isosorbide  mononitrate (IMDUR ) 60 MG 24 hr tablet Take 1 tablet (60 mg total) by mouth daily. 01/10/24   Wonda Sharper, MD  lisinopril  (ZESTRIL ) 2.5 MG tablet Take 1 tablet (2.5 mg total) by mouth daily. 01/10/24   Cooper, Michael, MD  meclizine (ANTIVERT) 25 MG tablet Take 25 mg by mouth at bedtime.    [provider]  methocarbamol (ROBAXIN) 750 MG tablet SMARTSIG:1 Tablet(s) By Mouth Every 12 Hours 07/14/21   [provider]  nitroGLYCERIN  (NITROSTAT ) 0.4 MG SL tablet DISSOLVE ONE TABLET UNDER THE TONGUE EVERY 5 MINUTES AS NEEDED FOR CHEST PAIN.  DO NOT EXCEED A TOTAL OF 3 DOSES IN 15 MINUTES NOW 04/17/23   Wonda Sharper, MD  ondansetron  (ZOFRAN ) 8 MG tablet Take 8 mg by mouth every 8 (eight) hours as needed. 06/08/21   [provider]  oxycodone  (ROXICODONE ) 30 MG immediate release tablet Take 15 mg by mouth 4 (four) times daily. 07/10/21   [provider]  OXYCODONE  ER PO Take 1 tablet by mouth every 8 (eight) hours as needed (PAIN).     [provider]  promethazine  (PHENERGAN ) 12.5 MG  tablet Take 12.5 mg by mouth every 8 (eight) hours as needed. 07/14/21   [provider]  promethazine  (PHENERGAN ) 25 MG tablet Take 1 tablet (25 mg total) by mouth every 6 (six) hours as needed for nausea or vomiting. 03/26/24   Kammerer, Megan L, DO  tiotropium (SPIRIVA ) 18 MCG inhalation capsule Place 18 mcg into inhaler and inhale daily as needed (shortness of breath).     [provider]     Critical care time:

## 2024-04-03 NOTE — Plan of Care (Signed)
  Problem: Coping: Goal: Ability to adjust to condition or change in health will improve Outcome: Progressing   Problem: Fluid Volume: Goal: Ability to maintain a balanced intake and output will improve Outcome: Progressing   Problem: Health Behavior/Discharge Planning: Goal: Ability to manage health-related needs will improve Outcome: Progressing   Problem: Metabolic: Goal: Ability to maintain appropriate glucose levels will improve Outcome: Progressing

## 2024-04-04 ENCOUNTER — Encounter (HOSPITAL_COMMUNITY): Payer: Self-pay | Admitting: Pulmonary Disease

## 2024-04-04 DIAGNOSIS — I2510 Atherosclerotic heart disease of native coronary artery without angina pectoris: Secondary | ICD-10-CM | POA: Diagnosis not present

## 2024-04-04 DIAGNOSIS — E785 Hyperlipidemia, unspecified: Secondary | ICD-10-CM

## 2024-04-04 DIAGNOSIS — I340 Nonrheumatic mitral (valve) insufficiency: Secondary | ICD-10-CM

## 2024-04-04 DIAGNOSIS — I951 Orthostatic hypotension: Secondary | ICD-10-CM | POA: Diagnosis not present

## 2024-04-04 DIAGNOSIS — R55 Syncope and collapse: Secondary | ICD-10-CM | POA: Diagnosis not present

## 2024-04-04 DIAGNOSIS — I10 Essential (primary) hypertension: Secondary | ICD-10-CM

## 2024-04-04 DIAGNOSIS — I5032 Chronic diastolic (congestive) heart failure: Secondary | ICD-10-CM

## 2024-04-04 LAB — GLUCOSE, CAPILLARY
Glucose-Capillary: 113 mg/dL — ABNORMAL HIGH (ref 70–99)
Glucose-Capillary: 123 mg/dL — ABNORMAL HIGH (ref 70–99)
Glucose-Capillary: 153 mg/dL — ABNORMAL HIGH (ref 70–99)
Glucose-Capillary: 199 mg/dL — ABNORMAL HIGH (ref 70–99)
Glucose-Capillary: 85 mg/dL (ref 70–99)
Glucose-Capillary: 96 mg/dL (ref 70–99)

## 2024-04-04 LAB — BASIC METABOLIC PANEL WITH GFR
Anion gap: 6 (ref 5–15)
BUN: 11 mg/dL (ref 8–23)
CO2: 31 mmol/L (ref 22–32)
Calcium: 8.8 mg/dL — ABNORMAL LOW (ref 8.9–10.3)
Chloride: 101 mmol/L (ref 98–111)
Creatinine, Ser: 0.71 mg/dL (ref 0.44–1.00)
GFR, Estimated: >60 mL/min (ref >60)
Glucose, Bld: 97 mg/dL (ref 70–99)
Potassium: 4.4 mmol/L (ref 3.5–5.1)
Sodium: 138 mmol/L (ref 135–145)

## 2024-04-04 NOTE — Plan of Care (Signed)
  Problem: Coping: Goal: Ability to adjust to condition or change in health will improve Outcome: Progressing   Problem: Health Behavior/Discharge Planning: Goal: Ability to manage health-related needs will improve Outcome: Progressing   Problem: Metabolic: Goal: Ability to maintain appropriate glucose levels will improve Outcome: Progressing   Problem: Pain Managment: Goal: General experience of comfort will improve and/or be controlled Outcome: Progressing   Problem: Safety: Goal: Ability to remain free from injury will improve Outcome: Progressing

## 2024-04-04 NOTE — Progress Notes (Signed)
 [facility] PROGRESS NOTE   Sara Hobbs FMW:[id number] DOB: 05-Feb-1961 DOA: 04/01/2024  PCP: Dsa, Joylin G, MD  Brief History: 63 year old female patient with a complicated medical history including coronary artery disease dating back to 2017 with prior drug-eluting stent to the circumflex. Ischemic cardiomyopathy with a EF 55 to 60% and grade 1 diastolic dysfunction, as well as trivial MR, tobacco abuse, COPD who was recently seen in the emergency department on 7/24 after a fall when she was diagnosed with right fifth metatarsal fracture which was nondisplaced.  She presented to cardiology office for routine follow-up and at that time was noted to be diaphoretic and lethargic.  She was noted to have hypotension.  She was sent to the emergency department and then admitted to the ICU.  She required pressors.  She was stabilized and then transferred to floor.  Consultants: Critical care medicine.  Cardiology will be consulted.  Procedures: Echocardiogram    Subjective/Interval History: Patient complains of feeling weak and dizzy at times especially when she has to get up from a lying position.  Denies any chest pain or shortness of breath this morning.    Assessment/Plan:  Syncope versus presyncope Thought to be secondary to orthostatic hypotension.  Thought to be secondary to diarrhea that she had a few days prior to admission.  No further syncopal episodes in the hospital. CT scan of the chest abdomen pelvis was done which raised concern for colitis.  Left lower lobe pulmonary nodule was unchanged from before. Lower extremity Doppler studies negative for DVT.  Does not have any symptoms of colitis currently.  Orthostatic hypotension Thought to be due to hypovolemia.  However despite aggressive hydration including use of pressors or orthostatic symptoms have not improved.  Remains orthostatic.  Will use compression stockings. Echocardiogram suggest severe mitral  regurgitation.  Previously this was mild to moderate.  No evidence of pulmonary edema.  May need cardiology input.  Severe mitral regurgitation Will request cardiology input.  Coronary artery disease Has a drug-eluting stent to circumflex previously.  No active cardiac issues currently.  Chronic diastolic CHF Stable.  Was hypokalemic at admission.  Diabetes mellitus type 2 with hyperglycemia HbA1c 12.7.  Had not been on insulin  in admission.  Was on insulin  previously but then stopped taking them about a year ago.  Currently on glargine.  Will be discharged on ReliOn insulin  due to affordability issues.  History of COPD Respiratory status is stable.  Diabetic neuropathy Continue Neurontin .  Acute nondisplaced fracture of the right fifth metatarsal Continue with boot.  Outpatient follow-up with orthopedics or podiatry.  PT and OT.  Pain control.  Obesity Estimated body mass index is 32.84 kg/m as calculated from the following:   Height as of this encounter: 5' 1 (1.549 m).   Weight as of this encounter: 78.8 kg.   DVT Prophylaxis: Lovenox  Code Status: Full code Family Communication: Discussed with patient Disposition Plan: Hopefully return home when improved     Medications: Scheduled:  arformoterol   15 mcg Nebulization BID   atorvastatin   80 mg Oral Daily   budesonide  (PULMICORT ) nebulizer solution  0.5 mg Nebulization BID   Chlorhexidine  Gluconate Cloth  6 each Topical Daily   clopidogrel   75 mg Oral Daily   DULoxetine   60 mg Oral Daily   enoxaparin  (LOVENOX ) injection  40 mg Subcutaneous Q24H   fenofibrate   54 mg Oral Daily   gabapentin   100 mg Oral Q breakfast   insulin  aspart  0-15 Units Subcutaneous Q4H  insulin  glargine-yfgn  30 Units Subcutaneous Daily   oxycodone   15 mg Oral TID AC & HS   Ensure Max Protein  11 oz Oral Daily   revefenacin   175 mcg Nebulization Daily   Continuous:  promethazine  (PHENERGAN ) injection (IM or IVPB) Stopped (04/03/24 1224)    PRN:dextrose , docusate sodium , HYDROmorphone , mouth rinse, polyethylene glycol, promethazine  (PHENERGAN ) injection (IM or IVPB)  Antibiotics: Anti-infectives (From admission, onward)    Start     Dose/Rate Route Frequency Ordered Stop   04/01/24 2000  cefTRIAXone  (ROCEPHIN ) 2 g in sodium chloride  0.9 % 100 mL IVPB  Status:  Discontinued        2 g 200 mL/hr over 30 Minutes Intravenous Every 24 hours 04/01/24 1948 04/02/24 0856       Objective:  Vital Signs  Vitals:   04/04/24 0334 04/04/24 0339 04/04/24 0400 04/04/24 0801  BP:  128/79 111/63 (!) 124/53  Pulse:  66 60 77  Resp:  15 (!) 8 12  Temp:  97.6 F (36.4 C)  98.3 F (36.8 C)  TempSrc:  Oral  Oral  SpO2:  98% 96% 93%  Weight: 78.8 kg     Height:        Intake/Output Summary (Last 24 hours) at 04/04/2024 0843 Last data filed at 04/04/2024 0820 Gross per 24 hour  Intake 270 ml  Output 1625 ml  Net -1355 ml   Filed Weights   04/02/24 0428 04/03/24 0500 04/04/24 0334  Weight: 74 kg 76.7 kg 78.8 kg    General appearance: Awake alert.  In no distress Resp: Clear to auscultation bilaterally.  Normal effort Cardio: S1-S2 is normal regular.  No S3-S4.  Systolic murmur appreciated over the precordium. GI: Abdomen is soft.  Nontender nondistended.  Bowel sounds are present normal.  No masses organomegaly Extremities: No edema.  Full range of motion of lower extremities. Neurologic: Alert and oriented x3.  No focal neurological deficits.    Lab Results:  Data Reviewed: I have personally reviewed following labs and reports of the imaging studies  CBC: Recent Labs  Lab 04/01/24 1636 04/01/24 1809 04/01/24 2136 04/02/24 0259 04/02/24 0630  WBC 8.9  --  12.2* 11.1*  --   NEUTROABS 6.3  --   --   --   --   HGB 12.1 12.9 13.1 12.9 12.2  HCT 35.0* 36.7 37.5 36.4 36.0  MCV 91.6  --  91.9 91.5  --   PLT 166  --  175 175  --     Basic Metabolic Panel: Recent Labs  Lab 04/01/24 1636 04/01/24 2136  04/02/24 0259 04/02/24 0630 04/03/24 0400 04/04/24 0218  NA 134*  --  141 142 139 138  K 3.3*  --  4.0 3.3* 3.8 4.4  CL 97*  --  107  --  106 101  CO2 27  --  27  --  26 31  GLUCOSE 472*  --  123*  --  109* 97  BUN 17  --  11  --  9 11  CREATININE 1.04*  --  0.61  --  0.64 0.71  CALCIUM  8.5*  --  8.8*  --  8.8* 8.8*  MG  --   --  1.5*  --   --   --   PHOS  --  3.1 2.6  --   --   --     GFR: Estimated Creatinine Clearance: 69.3 mL/min (by C-G formula based on SCr of 0.71 mg/dL).  Liver Function  Tests: Recent Labs  Lab 04/01/24 1636  AST 21  ALT 45*  ALKPHOS 81  BILITOT 0.4  PROT 5.1*  ALBUMIN  3.2*    Recent Labs  Lab 04/01/24 2136  LIPASE 25  AMYLASE 17*   HbA1C: Recent Labs    04/01/24 2136  HGBA1C 12.7*    CBG: Recent Labs  Lab 04/03/24 1636 04/03/24 2026 04/03/24 2348 04/04/24 0336 04/04/24 0800  GLUCAP 145* 213* 78 113* 123*    Thyroid Function Tests: Recent Labs    04/01/24 1755  TSH 3.020    Recent Results (from the past 240 hours)  Blood culture (routine x 2)     Status: None (Preliminary result)   Collection Time: 04/01/24  5:55 PM   Specimen: BLOOD RIGHT HAND  Result Value Ref Range Status   Specimen Description BLOOD RIGHT HAND  Final   Special Requests   Final    BOTTLES DRAWN AEROBIC AND ANAEROBIC Blood Culture results may not be optimal due to an inadequate volume of blood received in culture bottles   Culture   Final    NO GROWTH 3 DAYS Performed at [facility], [address] [place], [place] [address]    Report Status PENDING  Incomplete  Blood culture (routine x 2)     Status: None (Preliminary result)   Collection Time: 04/01/24  6:00 PM   Specimen: BLOOD LEFT HAND  Result Value Ref Range Status   Specimen Description BLOOD LEFT HAND  Final   Special Requests   Final    BOTTLES DRAWN AEROBIC AND ANAEROBIC Blood Culture adequate volume   Culture   Final    NO GROWTH 3 DAYS Performed at [facility]  Lab, [address], [place], [place] [address]    Report Status PENDING  Incomplete  MRSA Next Gen by PCR, Nasal     Status: None   Collection Time: 04/01/24  8:05 PM   Specimen: Nasal Mucosa; Nasal Swab  Result Value Ref Range Status   MRSA by PCR Next Gen NOT DETECTED NOT DETECTED Final    Comment: (NOTE) The GeneXpert MRSA Assay (FDA approved for NASAL specimens only), is one component of a comprehensive MRSA colonization surveillance program. It is not intended to diagnose MRSA infection nor to guide or monitor treatment for MRSA infections. Test performance is not FDA approved in patients less than 41 years old. Performed at [facility], [address] [place], [place] [address]   Urine Culture (for pregnant, neutropenic or urologic patients or patients with an indwelling urinary catheter)     Status: None   Collection Time: 04/02/24  1:34 AM   Specimen: Urine, Catheterized  Result Value Ref Range Status   Specimen Description URINE, CATHETERIZED  Final   Special Requests NONE  Final   Culture   Final    NO GROWTH Performed at [facility], [address], [place], [place] [address]    Report Status 04/03/2024 FINAL  Final      Radiology Studies: VAS US  LOWER EXTREMITY VENOUS (DVT) Result Date: 04/02/2024  Lower Venous DVT Study Patient Name:  Sara Hobbs  Date of Exam:   04/02/2024 Medical Rec #: [id number]       Accession #:    [id number] Date of Birth: Jun 20, 1961      Patient Gender: F Patient Age:   13 years Exam Location:  [facility] Procedure:      VAS US  LOWER EXTREMITY VENOUS (DVT)  Referring Phys: MAUDE BANNER --------------------------------------------------------------------------------  Indications: SOB, Hypotension. Diabetic, non compliant with insulin  > 1 year. Glucose >400 on admission. Lateral calf pain, ankle swelling.  Risk Factors: Status post fall 03/26/24 with right fifth metatarsal fracture. Comparison Study: Prior negative bilateral LEV  done 06/19/16 Performing Technologist: Alberta Lis RVS  Examination Guidelines: A complete evaluation includes B-mode imaging, spectral Doppler, color Doppler, and power Doppler as needed of all accessible portions of each vessel. Bilateral testing is considered an integral part of a complete examination. Limited examinations for reoccurring indications may be performed as noted. The reflux portion of the exam is performed with the patient in reverse Trendelenburg.  +---------+---------------+---------+-----------+----------+--------------+ RIGHT    CompressibilityPhasicitySpontaneityPropertiesThrombus Aging +---------+---------------+---------+-----------+----------+--------------+ CFV      Full           Yes      Yes                                 +---------+---------------+---------+-----------+----------+--------------+ SFJ      Full                                                        +---------+---------------+---------+-----------+----------+--------------+ FV Prox  Full                                                        +---------+---------------+---------+-----------+----------+--------------+ FV Mid   Full                                                        +---------+---------------+---------+-----------+----------+--------------+ FV DistalFull                                                        +---------+---------------+---------+-----------+----------+--------------+ PFV      Full                                                        +---------+---------------+---------+-----------+----------+--------------+ POP      Full           Yes      Yes                                 +---------+---------------+---------+-----------+----------+--------------+ PTV      Full                                                        +---------+---------------+---------+-----------+----------+--------------+  PERO     Full                                                         +---------+---------------+---------+-----------+----------+--------------+ Gastroc  Full                                                        +---------+---------------+---------+-----------+----------+--------------+ ATV      Full                                                        +---------+---------------+---------+-----------+----------+--------------+   +----+---------------+---------+-----------+----------+--------------+ LEFTCompressibilityPhasicitySpontaneityPropertiesThrombus Aging +----+---------------+---------+-----------+----------+--------------+ CFV Full           Yes      Yes                                 +----+---------------+---------+-----------+----------+--------------+ SFJ Full                                                        +----+---------------+---------+-----------+----------+--------------+     Summary: RIGHT: - There is no evidence of deep vein thrombosis in the lower extremity.  - No cystic structure found in the popliteal fossa.  LEFT: - No evidence of common femoral vein obstruction.   *See table(s) above for measurements and observations. Electronically signed by Lonni Gaskins MD on 04/02/2024 at 3:50:34 PM.    Final        LOS: 3 days   Joette Pebbles  [facility] Pager on [email or web address]  04/04/2024, 8:43 AM

## 2024-04-04 NOTE — Plan of Care (Signed)

## 2024-04-04 NOTE — Consult Note (Addendum)
 Cardiology Consultation:   Patient ID: Sara Hobbs MRN: [id number]; DOB: 02-22-1961  Admit date: 04/01/2024 Date of Consult: 04/04/2024  Primary Care Provider: Dsa, Joylin G, MD [facility] Cardiologist: Ozell Fell, MD  [facility] Electrophysiologist:  None    Patient Profile:   Sara Hobbs is a 63 y.o. female with a hx of chronic angina, CAD, asthma, CHF with HFpEF, COPD, diabetes mellitus, hypertension, GERD, insulin -dependent diabetes mellitus who is being seen today for the evaluation of severe regurgitation and orthostatic hypotension at the request of Sara Pebbles, MD .  History of Present Illness:   [name] this is a 63 year old female with an extensive history of CAD.  She is status post remote CABG with LIMA to LAD, SVG to PDA in 2016.  She suffered a cardiac arrest 11/21/2015 and At that time showed a patent LIMA to the LAD and a CTO of the SVG to PDA with 99% stenosis in the proximal to mid left circumflex status post DES placed to the left circumflex.  Last Myoview  was in 2022 showing inferior infarct with mild peri-infarct ischemia EF 71%.  Medical therapy was recommended.  She has a known ischemic cardiomyopathy with last echo in 2022 showing EF 55 to 60% with G1 DD and trivial MR.  She also has a history of diabetes mellitus insulin -dependent with peripheral neuropathy, COPD, hyperlipidemia, hypertension, GERD, tobacco abuse and polycythemia.  She was last seen by Dr. Fell a year ago and was doing well on multidrug antianginal therapy with chronic atypical/typical chest pain.  Because of ongoing chest pain repeat Lexi Myoview  was done which showed scar with possible soft tissue attenuation in the inferior wall inferior septal wall and apex with no significant ischemia.  EF was 50% with mild inferior hypokinesis.  She was last seen by Glendia Ferrier, PA on 03/26/2024 after an ER visit due to a right fifth metatarsal fracture after getting dizzy and suffering  a fall.  She was seen in the office for routine follow-up but became diaphoretic and lethargic and was transferred to the emergency room from the cardiology office.  Systolic blood pressure was 60 and she complained of shortness of breath but no chest pain.  She did lose consciousness briefly.  She had had nausea and vomiting the day prior.  EKG showed sinus rhythm with inferior infarct and possible lateral ischemia.  Initial BP 78/58 mmHg.  Apparently patient has been having frequent falls mainly due to lightheadedness.  In the ER blood glucose was 448, serum creatinine 1.04, hemoglobin 12.1, platelet count 166, initial high-sensitivity troponin was 6.  She received 2 L of lactated Ringer 's with minimal improvement in blood pressure and was started on nor epi.  She was also noted to have dark stools while in the ER.  She was admitted to the critical care service.  Orthostatic on admission.  2D echo 04/02/2024 showed EF 60 to 65% with normal diastolic function, normal RV with moderately thickened and calcified mitral valve with severe central mitral regurgitation follow-up.  Cardiology is now asked to consult for further workup of mitral regurgitation.  Of note he had a 2D echo in 2020 with 2 reading which there was trivial MR.  He is euvolemic throughout the availability of this morning about her  She tells me that she had been having a lot of problems with dizziness and has fallen quite a few times due to dizziness.  Dizziness seems to be brought on when she is standing up walking. She denies  any chest pain or pressure.  She says that usually she does have SOB but sometimes when she exerts herself she will have DOE.  She has been having problems with LE edema and takes diuretics for years and her daughter feels that has gotten worse.   Past Medical History:  Diagnosis Date   Angina pectoris (HCC)    Anxiety    Arthritis    hands, neck, back, knees, hips, ankles (11/22/2014)   Arthritis    Asthma    CAD  (coronary artery disease)    a. 09/2014: LIMA to LAD, SVG to PDA, EVH via right thigh b. Cardiac Arrest 11/2015: patent LIMA-LAD, CTO if SVG-PDA, 99% stenosis of Prox-Mid Cx w/ DES placed // Myoview  7/22 EF 71, inf infarct with peri-infarct ischemia; intermediate risk   CAD, multiple vessel    Cardiac arrest (HCC) 11/25/2015   Chest tightness    CHF (congestive heart failure) (HCC)    Echocardiogram 7/22: EF 55-60, no RWMA, mod LVH, Gr 1 DD, normal RVSF, trivial MR   Chronic lower back pain    Chronic shoulder pain    right; I've got bad rotator cuff   COPD (chronic obstructive pulmonary disease) (HCC)    Coronary artery disease    Depression    Diabetes mellitus with complication (HCC)    Diabetes mellitus without complication (HCC)    Essential hypertension 05/20/2018   GERD (gastroesophageal reflux disease)    Headache    monthly (11/22/2014)   History of blood transfusion    when I had my 1st youngun; when I had MI   History of hiatal hernia    History of stomach ulcers    Hyperlipidemia    Hypothyroidism    PMH:   IDDM (insulin  dependent diabetes mellitus)    Incidental lung nodule, > 3mm and < 8mm 11/22/2014   Small non-calcified nodules left lung   Myocardial infarction (HCC) 11/2013   PCI w/ drug eluting stent LCx   Neuropathy    Neuropathy due to secondary diabetes (HCC)    Wears glasses     Past Surgical History:  Procedure Laterality Date   ABDOMINAL HYSTERECTOMY  1990's   ABDOMINAL HYSTERECTOMY     APPLICATION OF WOUND VAC N/A 11/23/2014   Procedure: POSSIBLE APPLICATION OF WOUND VAC;  Surgeon: Sudie VEAR Laine, MD;  Location: [facility] OR;  Service: Thoracic;  Laterality: N/A;   BREAST SURGERY Left 1990's   breast duct surgery   CARDIAC CATHETERIZATION  09/01/14   CARDIAC CATHETERIZATION N/A 11/25/2015   Procedure: Left Heart Cath and Cors/Grafts Angiography;  Surgeon: Ozell Fell, MD;  Location: [facility] INVASIVE CV LAB;  Service: Cardiovascular;  Laterality: N/A;    CARDIAC CATHETERIZATION N/A 11/25/2015   Procedure: Coronary Stent Intervention;  Surgeon: Ozell Fell, MD;  Location: [facility] INVASIVE CV LAB;  Service: Cardiovascular;  Laterality: N/A;   CARDIAC CATHETERIZATION N/A 06/19/2016   Procedure: Left Heart Cath and Cors/Grafts Angiography;  Surgeon: Peter M Swaziland, MD;  Location: [facility] INVASIVE CV LAB;  Service: Cardiovascular;  Laterality: N/A;   CARDIAC SURGERY     CESAREAN SECTION  1982; 1984   CESAREAN SECTION     COLONOSCOPY     CORONARY ANGIOPLASTY WITH STENT PLACEMENT  11/11/2013   Drug-eluting stent in LCx   CORONARY ARTERY BYPASS GRAFT N/A 09/22/2014   Procedure: CORONARY ARTERY BYPASS GRAFTING (CABG);  Surgeon: Sudie VEAR Laine, MD;  Location: [facility] OR;  Service: Open Heart Surgery;  Laterality: N/A;  Times 2 using left  internal mammary artery and endoscopically harvested right saphenous vein   ESOPHAGOGASTRODUODENOSCOPY     GASTRIC RESECTION  1990's   bezoar; for reflux   LAPAROSCOPIC CHOLECYSTECTOMY  2000   STERNAL WOUND DEBRIDEMENT N/A 11/23/2014   Procedure: SUPERFICIAL STERNAL WOUND DEBRIDEMENT;  Surgeon: Sudie VEAR Laine, MD;  Location: [facility] OR;  Service: Thoracic;  Laterality: N/A;   TEE WITHOUT CARDIOVERSION N/A 09/22/2014   Procedure: TRANSESOPHAGEAL ECHOCARDIOGRAM (TEE);  Surgeon: Sudie VEAR Laine, MD;  Location: [facility] OR;  Service: Open Heart Surgery;  Laterality: N/A;     Reason  Inpatient Medications: Scheduled Meds:  arformoterol   15 mcg Nebulization BID   atorvastatin   80 mg Oral Daily   budesonide  (PULMICORT ) nebulizer solution  0.5 mg Nebulization BID   Chlorhexidine  Gluconate Cloth  6 each Topical Daily   clopidogrel   75 mg Oral Daily   DULoxetine   60 mg Oral Daily   enoxaparin  (LOVENOX ) injection  40 mg Subcutaneous Q24H   fenofibrate   54 mg Oral Daily   gabapentin   100 mg Oral Q breakfast   insulin  aspart  0-15 Units Subcutaneous Q4H   insulin  glargine-yfgn  30 Units Subcutaneous Daily   oxycodone   15 mg Oral TID AC &  HS   Ensure Max Protein  11 oz Oral Daily   revefenacin   175 mcg Nebulization Daily   Continuous Infusions:  promethazine  (PHENERGAN ) injection (IM or IVPB) Stopped (04/03/24 1224)   PRN Meds: dextrose , docusate sodium , HYDROmorphone , mouth rinse, polyethylene glycol, promethazine  (PHENERGAN ) injection (IM or IVPB)  Allergies:    Allergies  Allergen Reactions   Wellbutrin [Bupropion] Other (See Comments) and Palpitations    Makes me feel like im having a heart attack SEVERE CHEST PAIN    Social History:   Social History   Socioeconomic History   Marital status: Married    Spouse name: Not on file   Number of children: Not on file   Years of education: Not on file   Highest education level: Not on file  Occupational History   Not on file  Tobacco Use   Smoking status: Every Day    Current packs/day: 2.00    Average packs/day: 2.0 packs/day for 41.0 years (82.0 ttl pk-yrs)    Types: Cigarettes   Smokeless tobacco: Never  Vaping Use   Vaping status: Never Used  Substance and Sexual Activity   Alcohol use: Not Currently   Drug use: No   Sexual activity: Yes  Other Topics Concern   Not on file  Social History Narrative   ** Merged History Encounter **       Social Drivers of Health   Financial Resource Strain: Low Risk  (09/13/2023)   Received from [facility]   Overall Financial Resource Strain (CARDIA)    Difficulty of Paying Living Expenses: Not very hard  Food Insecurity: No Food Insecurity (04/02/2024)   Hunger Vital Sign    Worried About Running Out of Food in the Last Year: Never true    Ran Out of Food in the Last Year: Never true  Transportation Needs: No Transportation Needs (04/02/2024)   PRAPARE - Administrator, Civil Service (Medical): No    Lack of Transportation (Non-Medical): No  Physical Activity: Not on file  Stress: Not on file  Social Connections: Unknown (01/04/2022)   Received from [facility]   Social Network    Social  Network: Not on file  Intimate Partner Violence: Not At Risk (04/02/2024)   Humiliation, Afraid, Rape, and  Kick questionnaire    Fear of Current or Ex-Partner: No    Emotionally Abused: No    Physically Abused: No    Sexually Abused: No    Family History:    Family History  Adopted: Yes  Family history unknown: Yes     ROS:  Please see the history of present illness.   All other ROS reviewed and negative.     Physical Exam/Data:   Vitals:   04/04/24 0339 04/04/24 0400 04/04/24 0801 04/04/24 1216  BP: 128/79 111/63 (!) 124/53 (!) 116/58  Pulse: 66 60 77 76  Resp: 15 (!) 8 12 17   Temp: 97.6 F (36.4 C)  98.3 F (36.8 C) 99 F (37.2 C)  TempSrc: Oral  Oral Oral  SpO2: 98% 96% 93% 98%  Weight:      Height:        Intake/Output Summary (Last 24 hours) at 04/04/2024 1526 Last data filed at 04/04/2024 1228 Gross per 24 hour  Intake 574 ml  Output 875 ml  Net -301 ml      04/04/2024    3:34 AM 04/03/2024    5:00 AM 04/02/2024    4:28 AM  Last 3 Weights  Weight (lbs) 173 lb 12.8 oz 169 lb 1.5 oz 163 lb 2.3 oz  Weight (kg) 78.835 kg 76.7 kg 74 kg     Body mass index is 32.84 kg/m.  General:  Well nourished, well developed, in no acute distress  HEENT: normal Lymph: no adenopathy Neck: no JVD Endocrine:  No thryomegaly Vascular: No carotid bruits; FA pulses 2+ bilaterally without bruits  Cardiac:  normal S1, S2; RRR; no murmur  Lungs:  clear to auscultation bilaterally, no wheezing, rhonchi or rales  Abd: soft, nontender, no hepatomegaly  Ext: no edema Musculoskeletal:  No deformities, BUE and BLE strength normal and equal Skin: warm and dry  Neuro:  CNs 2-12 intact, no focal abnormalities noted Psych:  Normal affect   EKG:  The EKG was personally reviewed and demonstrates: Twelve-lead EKG personally reviewed on admission and showed sinus rhythm at 67 bpm with nonspecific interventricular conduction delay.  Compared to EKG March 21, 2021 there was no significant  change except for new biphasic T wave in V2..  Telemetry:  Telemetry was personally reviewed and demonstrates: Normal sinus rhythm  Laboratory Data:  High Sensitivity Troponin:   Recent Labs  Lab 04/01/24 1636 04/01/24 1809  TROPONINIHS 6 7     Chemistry Recent Labs  Lab 04/02/24 0259 04/02/24 0630 04/03/24 0400 04/04/24 0218  NA 141 142 139 138  K 4.0 3.3* 3.8 4.4  CL 107  --  106 101  CO2 27  --  26 31  GLUCOSE 123*  --  109* 97  BUN 11  --  9 11  CREATININE 0.61  --  0.64 0.71  CALCIUM  8.8*  --  8.8* 8.8*  GFRNONAA >60  --  >60 >60  ANIONGAP 7  --  7 6    Recent Labs  Lab 04/01/24 1636  PROT 5.1*  ALBUMIN  3.2*  AST 21  ALT 45*  ALKPHOS 81  BILITOT 0.4   Hematology Recent Labs  Lab 04/01/24 1636 04/01/24 1809 04/01/24 2136 04/02/24 0259 04/02/24 0630  WBC 8.9  --  12.2* 11.1*  --   RBC 3.82*  --  4.08 3.98  --   HGB 12.1   < > 13.1 12.9 12.2  HCT 35.0*   < > 37.5 36.4 36.0  MCV 91.6  --  91.9 91.5  --   MCH 31.7  --  32.1 32.4  --   MCHC 34.6  --  34.9 35.4  --   RDW 12.3  --  12.4 12.5  --   PLT 166  --  175 175  --    < > = values in this interval not displayed.   BNPNo results for input(s): BNP, PROBNP in the last 168 hours.  DDimer No results for input(s): DDIMER in the last 168 hours.   Radiology/Studies:  VAS US  LOWER EXTREMITY VENOUS (DVT) Result Date: 04/02/2024  Lower Venous DVT Study Patient Name:  MAHAYLA HADDAWAY  Date of Exam:   04/02/2024 Medical Rec #: [id number]       Accession #:    [id number] Date of Birth: 1961-01-09      Patient Gender: F Patient Age:   17 years Exam Location:  [facility] Procedure:      VAS US  LOWER EXTREMITY VENOUS (DVT) Referring Phys: MAUDE BANNER --------------------------------------------------------------------------------  Indications: SOB, Hypotension. Diabetic, non compliant with insulin  > 1 year. Glucose >400 on admission. Lateral calf pain, ankle swelling.  Risk Factors: Status post fall  03/26/24 with right fifth metatarsal fracture. Comparison Study: Prior negative bilateral LEV done 06/19/16 Performing Technologist: Alberta Lis RVS  Examination Guidelines: A complete evaluation includes B-mode imaging, spectral Doppler, color Doppler, and power Doppler as needed of all accessible portions of each vessel. Bilateral testing is considered an integral part of a complete examination. Limited examinations for reoccurring indications may be performed as noted. The reflux portion of the exam is performed with the patient in reverse Trendelenburg.  +---------+---------------+---------+-----------+----------+--------------+ RIGHT    CompressibilityPhasicitySpontaneityPropertiesThrombus Aging +---------+---------------+---------+-----------+----------+--------------+ CFV      Full           Yes      Yes                                 +---------+---------------+---------+-----------+----------+--------------+ SFJ      Full                                                        +---------+---------------+---------+-----------+----------+--------------+ FV Prox  Full                                                        +---------+---------------+---------+-----------+----------+--------------+ FV Mid   Full                                                        +---------+---------------+---------+-----------+----------+--------------+ FV DistalFull                                                        +---------+---------------+---------+-----------+----------+--------------+ PFV      Full                                                        +---------+---------------+---------+-----------+----------+--------------+  POP      Full           Yes      Yes                                 +---------+---------------+---------+-----------+----------+--------------+ PTV      Full                                                         +---------+---------------+---------+-----------+----------+--------------+ PERO     Full                                                        +---------+---------------+---------+-----------+----------+--------------+ Gastroc  Full                                                        +---------+---------------+---------+-----------+----------+--------------+ ATV      Full                                                        +---------+---------------+---------+-----------+----------+--------------+   +----+---------------+---------+-----------+----------+--------------+ LEFTCompressibilityPhasicitySpontaneityPropertiesThrombus Aging +----+---------------+---------+-----------+----------+--------------+ CFV Full           Yes      Yes                                 +----+---------------+---------+-----------+----------+--------------+ SFJ Full                                                        +----+---------------+---------+-----------+----------+--------------+     Summary: RIGHT: - There is no evidence of deep vein thrombosis in the lower extremity.  - No cystic structure found in the popliteal fossa.  LEFT: - No evidence of common femoral vein obstruction.   *See table(s) above for measurements and observations. Electronically signed by Lonni Gaskins MD on 04/02/2024 at 3:50:34 PM.    Final    ECHOCARDIOGRAM COMPLETE Result Date: 04/02/2024    ECHOCARDIOGRAM REPORT   Patient Name:   KAIRAH LEONI Date of Exam: 04/02/2024 Medical Rec #:  [id number]      Height:       61.0 in Accession #:    [id number]     Weight:       163.1 lb Date of Birth:  01-16-1961     BSA:          1.732 m Patient Age:    62 years       BP:           102/58 mmHg Patient Gender: F  HR:           69 bpm. Exam Location:  Inpatient Procedure: 2D Echo, 3D Echo, Cardiac Doppler and Color Doppler (Both Spectral            and Color Flow Doppler were utilized during procedure).  Indications:    Shock  History:        Patient has prior history of Echocardiogram examinations, most                 recent 03/27/2024. CAD, Signs/Symptoms:Shortness of Breath; Risk                 Factors:Hypertension and Dyslipidemia.  Sonographer:    Philomena Daring Referring Phys: [id number] PETER E BABCOCK IMPRESSIONS  1. Left ventricular ejection fraction, by estimation, is 60 to 65%. The left ventricle has normal function. The left ventricle has no regional wall motion abnormalities. Left ventricular diastolic parameters were normal.  2. Right ventricular systolic function is normal. The right ventricular size is normal.  3. The mitral valve is moderately thickened and caclified with severe central MR. The mitral valve is abnormal. Severe mitral valve regurgitation. No evidence of mitral stenosis.  4. The aortic valve is tricuspid. There is mild calcification of the aortic valve. Aortic valve regurgitation is not visualized. Aortic valve sclerosis/calcification is present, without any evidence of aortic stenosis.  5. The inferior vena cava is dilated in size with >50% respiratory variability, suggesting right atrial pressure of 8 mmHg. Conclusion(s)/Recommendation(s): The mitral valve is moderately thickened and caclified with severe central MR. Consider TEE to more fully evalaute if clinically indicated. FINDINGS  Left Ventricle: Left ventricular ejection fraction, by estimation, is 60 to 65%. The left ventricle has normal function. The left ventricle has no regional wall motion abnormalities. The left ventricular internal cavity size was normal in size. There is  no left ventricular hypertrophy. Left ventricular diastolic parameters were normal. Right Ventricle: The right ventricular size is normal. No increase in right ventricular wall thickness. Right ventricular systolic function is normal. Left Atrium: Left atrial size was normal in size. Right Atrium: Right atrial size was normal in size. Pericardium: There is  no evidence of pericardial effusion. Mitral Valve: The mitral valve is moderately thickened and caclified with severe central MR. The mitral valve is abnormal. There is moderate thickening of the mitral valve leaflet(s). There is mild calcification of the mitral valve leaflet(s). Severe mitral valve regurgitation, with centrally-directed jet. No evidence of mitral valve stenosis. Tricuspid Valve: The tricuspid valve is normal in structure. Tricuspid valve regurgitation is trivial. No evidence of tricuspid stenosis. Aortic Valve: The aortic valve is tricuspid. There is mild calcification of the aortic valve. Aortic valve regurgitation is not visualized. Aortic valve sclerosis/calcification is present, without any evidence of aortic stenosis. Pulmonic Valve: The pulmonic valve was normal in structure. Pulmonic valve regurgitation is not visualized. No evidence of pulmonic stenosis. Aorta: The aortic root is normal in size and structure. Venous: The inferior vena cava is dilated in size with greater than 50% respiratory variability, suggesting right atrial pressure of 8 mmHg. IAS/Shunts: No atrial level shunt detected by color flow Doppler.  LEFT VENTRICLE PLAX 2D LVIDd:         4.58 cm   Diastology LVIDs:         3.21 cm   LV e' medial:    6.96 cm/s LV PW:         0.90 cm   LV E/e' medial:  18.8 LV  IVS:        1.00 cm   LV e' lateral:   10.80 cm/s LVOT diam:     1.83 cm   LV E/e' lateral: 12.1 LV SV:         57 LV SV Index:   33 LVOT Area:     2.63 cm  RIGHT VENTRICLE            IVC RV S prime:     9.25 cm/s  IVC diam: 1.98 cm TAPSE (M-mode): 1.7 cm LEFT ATRIUM             Index        RIGHT ATRIUM           Index LA diam:        3.97 cm 2.29 cm/m   RA Area:     11.60 cm LA Vol (A2C):   39.8 ml 22.98 ml/m  RA Volume:   23.70 ml  13.68 ml/m LA Vol (A4C):   34.0 ml 19.63 ml/m LA Biplane Vol: 37.2 ml 21.48 ml/m  AORTIC VALVE LVOT Vmax:   96.40 cm/s LVOT Vmean:  64.500 cm/s LVOT VTI:    0.215 m  AORTA Ao Root diam:  2.66 cm Ao Asc diam:  2.79 cm MITRAL VALVE MV Area (PHT): 4.15 cm     SHUNTS MV Decel Time: 183 msec     Systemic VTI:  0.22 m MV E velocity: 131.00 cm/s  Systemic Diam: 1.83 cm MV A velocity: 127.00 cm/s MV E/A ratio:  1.03 Toribio Fuel MD Electronically signed by Toribio Fuel MD Signature Date/Time: 04/02/2024/10:03:40 AM    Final    CT CHEST ABDOMEN PELVIS W CONTRAST Result Date: 04/01/2024 CLINICAL DATA:  Sepsis. EXAM: CT CHEST, ABDOMEN, AND PELVIS WITH CONTRAST TECHNIQUE: Multidetector CT imaging of the chest, abdomen and pelvis was performed following the standard protocol during bolus administration of intravenous contrast. RADIATION DOSE REDUCTION: This exam was performed according to the departmental dose-optimization program which includes automated exposure control, adjustment of the mA and/or kV according to patient size and/or use of iterative reconstruction technique. CONTRAST:  75mL OMNIPAQUE  IOHEXOL  350 MG/ML SOLN COMPARISON:  11/22/2014. FINDINGS: CT CHEST FINDINGS Cardiovascular: The heart is normal in size and there is no pericardial effusion. Three-vessel coronary artery calcifications are noted. There is atherosclerotic calcification of the aorta without evidence of aneurysm. The pulmonary trunk is normal in caliber. Mediastinum/Nodes: No mediastinal, hilar, or axillary lymphadenopathy is seen. Coarse calcifications and subcentimeter nodules are noted in the thyroid gland. The trachea and esophagus are within normal limits. Lungs/Pleura: Atelectasis and/or scarring is noted bilaterally. No effusion or pneumothorax is seen. A 3 mm and a 5 mm nodule are present in the left lower lobe, unchanged from 2016. No new nodule is seen. Musculoskeletal: Sternotomy wires are noted. An old healed rib fractures present on the right. No acute osseous abnormality. CT ABDOMEN PELVIS FINDINGS Hepatobiliary: No focal liver abnormality is seen. Fatty infiltration of the liver is noted. Status post  cholecystectomy. There is more mild biliary ductal dilation measuring 1.1 cm. Pancreas: Unremarkable. No pancreatic ductal dilatation or surrounding inflammatory changes. Spleen: Normal in size without focal abnormality. Adrenals/Urinary Tract: The adrenal glands are within normal limits. The kidneys enhance symmetrically. No renal calculus or hydronephrosis is seen bilaterally. Subcentimeter hypodensities are present in the left kidney which are too small to further characterize. The bladder is unremarkable. Stomach/Bowel: The stomach is within normal limits. No bowel obstruction, free air, or pneumatosis is  seen. A moderate amount of retained stools present in the colon. There is multifocal segmental colonic wall thickening with associated fat stranding, most pronounced at the descending colon. Appendix appears normal. Vascular/Lymphatic: Aortic atherosclerosis. No enlarged abdominal or pelvic lymph nodes. Reproductive: Status post hysterectomy. No adnexal masses. Other: No abdominopelvic ascites. Musculoskeletal: No acute osseous abnormality. IMPRESSION: 1. Findings compatible with colitis. No bowel obstruction or free air is seen. 2. Status post cholecystectomy with mild biliary ductal dilatation. The common bile duct measures up to 1.1 cm. 3. Left lower lobe pulmonary nodules measuring up to 5 mm, unchanged from 2016 and likely benign. 4. Coronary artery calcifications. 5. Aortic atherosclerosis. Electronically Signed   By: Leita Birmingham M.D.   On: 04/01/2024 19:20     Assessment and Plan:   #Severe mitral regurgitation - She has a history of mitral regurgitation by echo 2022 - Now admitted for orthostatic hypotension with transient syncope in the ER and systolic blood pressure initially in the 60s requiring IV fluids and pressor support - 2D echo with normal LV function EF 60 to 65% with G1 DD and normal RV but now moderately thickened and calcified mitral valve with severe central mitral  regurgitation. - Unclear etiology of her mitral regurgitation and will therefore require further investigation with TEE. - Will make her n.p.o. after midnight on Sunday for TEE on Monday if schedule permits - Informed Consent   Shared Decision Making/Informed Consent anemia will be for iron dispersive The risks [esophageal damage, perforation (1:10,000 risk), bleeding, pharyngeal hematoma as well as other potential complications associated with conscious sedation including aspiration, arrhythmia, respiratory failure and death], benefits (treatment guidance and diagnostic support) and alternatives of a transesophageal echocardiogram were discussed in detail with Ms. Finks and she is willing to proceed.  - Will ask structural heart team-Dr. Wonda to see on Monday for further workup of MR  Syncope/presyncope - Admitted with diaphoresis and dizziness and had transient syncope in the ER -Systolic BP initially 60-requiring 2 L of LR as well as pressor support with Levophed  -BP today 103/67 mmHg with heart rate in the mid 60s. -Suspect hypovolemia as the etiology as she had preceding diarrhea and colitis noted on CT -She is on several medications that could survey orthostatic hypotension including Cymbalta , Lyrica, sertraline  and Demadex .  She is also on carvedilol , Imdur  and lisinopril .   -For now hold her Demadex , carvedilol , lisinopril  and Imdur  have been held - will add compression hose  #ASCAD #Hyperlipidemia -s/p remote CABG with LIMA to LAD, SVG to PDA in 2016.   -She suffered a cardiac arrest 11/21/2015 cath showing patent LIMA to the LAD and a CTO of the SVG to PDA with 99% stenosis in the proximal to mid left circumflex status post DES placed to the left circumflex.   -Last Myoview  was in 2022 showing inferior infarct with mild peri-infarct ischemia EF 71%.  Medical therapy was recommended -Denies any recent chest pain although has had some shortness of breath -High-sensitivity troponin  normal at 6 and 7 - 2D echo with normal LV function - No further ischemic workup at this time unless she is deemed a candidate for MitraClip versus MVR at which time she would need a right and left heart catheterization - Continue atorvastatin  80 mg daily, Plavix  75 mg daily - PTA carvedilol  and Imdur  on hold due to hypotension  #Hypertension - Now admitted with orthostatic hypotension - ACE inhibitor beta-blocker on hold  #Chronic HFpEF - Appears euvolemic on exam today -  Diuretics, ACE inhibitor and beta-blocker currently on hold due to hypotension     For questions or updates, please contact [facility] Please consult [email or web address] for contact info under    Signed, Wilbert Bihari, MD  04/04/2024 3:26 PM

## 2024-04-04 NOTE — Progress Notes (Signed)
 Patient states she feels really swollen. Noted to have a 4-lb weight gain since 04/03/2024 a.m.

## 2024-04-05 DIAGNOSIS — I340 Nonrheumatic mitral (valve) insufficiency: Secondary | ICD-10-CM | POA: Diagnosis not present

## 2024-04-05 DIAGNOSIS — R55 Syncope and collapse: Secondary | ICD-10-CM | POA: Diagnosis not present

## 2024-04-05 LAB — GLUCOSE, CAPILLARY
Glucose-Capillary: 152 mg/dL — ABNORMAL HIGH (ref 70–99)
Glucose-Capillary: 176 mg/dL — ABNORMAL HIGH (ref 70–99)
Glucose-Capillary: 183 mg/dL — ABNORMAL HIGH (ref 70–99)
Glucose-Capillary: 221 mg/dL — ABNORMAL HIGH (ref 70–99)
Glucose-Capillary: 61 mg/dL — ABNORMAL LOW (ref 70–99)
Glucose-Capillary: 88 mg/dL (ref 70–99)
Glucose-Capillary: 93 mg/dL (ref 70–99)

## 2024-04-05 LAB — CBC
HCT: 37 % (ref 36.0–46.0)
Hemoglobin: 12.4 g/dL (ref 12.0–15.0)
MCH: 31.6 pg (ref 26.0–34.0)
MCHC: 33.5 g/dL (ref 30.0–36.0)
MCV: 94.4 fL (ref 80.0–100.0)
Platelets: 148 K/uL — ABNORMAL LOW (ref 150–400)
RBC: 3.92 MIL/uL (ref 3.87–5.11)
RDW: 12.9 % (ref 11.5–15.5)
WBC: 5.7 K/uL (ref 4.0–10.5)
nRBC: 0 % (ref 0.0–0.2)

## 2024-04-05 LAB — BASIC METABOLIC PANEL WITH GFR
Anion gap: 7 (ref 5–15)
BUN: 14 mg/dL (ref 8–23)
CO2: 27 mmol/L (ref 22–32)
Calcium: 9.2 mg/dL (ref 8.9–10.3)
Chloride: 103 mmol/L (ref 98–111)
Creatinine, Ser: 0.78 mg/dL (ref 0.44–1.00)
GFR, Estimated: >60 mL/min (ref >60)
Glucose, Bld: 88 mg/dL (ref 70–99)
Potassium: 4.7 mmol/L (ref 3.5–5.1)
Sodium: 137 mmol/L (ref 135–145)

## 2024-04-05 MED ORDER — VITAMIN C 500 MG PO TABS
500.0000 mg | ORAL_TABLET | Freq: Every day | ORAL | Status: DC
  Administered 2024-04-05 – 2024-04-07 (×3): 500 mg via ORAL
  Filled 2024-04-05 (×3): qty 1

## 2024-04-05 NOTE — Progress Notes (Signed)
 Orthostatic blood pressures this morning are essentially normal.  Minimal drop in blood pressure with standing.  She is going to be n.p.o. after midnight for TEE tomorrow for MR assessment.  Dr. Wonda with structural heart will see her tomorrow

## 2024-04-05 NOTE — Progress Notes (Addendum)
 [facility] PROGRESS NOTE   Sara Hobbs FMW:[id number] DOB: 1960-09-06 DOA: 04/01/2024  PCP: Dsa, Joylin G, MD  Brief History: 63 year old female patient with a complicated medical history including coronary artery disease dating back to 2017 with prior drug-eluting stent to the circumflex. Ischemic cardiomyopathy with a EF 55 to 60% and grade 1 diastolic dysfunction, as well as trivial MR, tobacco abuse, COPD who was recently seen in the emergency department on 7/24 after a fall when she was diagnosed with right fifth metatarsal fracture which was nondisplaced.  She presented to cardiology office for routine follow-up and at that time was noted to be diaphoretic and lethargic.  She was noted to have hypotension.  She was sent to the emergency department and then admitted to the ICU.  She required pressors.  She was stabilized and then transferred to floor.  Consultants: Critical care medicine.  Cardiology.  Procedures: Echocardiogram    Subjective/Interval History: Patient feels slightly better this morning.  Did not get as dizzy as yesterday when she was stood up this morning.  Denies any chest pain or shortness of breath.     Assessment/Plan:  Syncope versus presyncope Thought to be secondary to orthostatic hypotension.  Thought to be secondary to diarrhea that she had a few days prior to admission.  No further syncopal episodes in the hospital. CT scan of the chest abdomen pelvis was done which raised concern for colitis.  Left lower lobe pulmonary nodule was unchanged from before. Lower extremity Doppler studies negative for DVT.   Does not have any symptoms of colitis currently. No further episodes of syncope.  Stable for the most part.  Orthostatic hypotension Thought to be due to hypovolemia.  However despite aggressive hydration including use of pressors or orthostatic symptoms have not improved.  Remains orthostatic.  Cortisol level was normal. Patient was started  on compression stockings and abdominal binder with improvement in orthostatic vital signs.   Echocardiogram suggest severe mitral regurgitation.  Previously this was mild to moderate.  No evidence of pulmonary edema.  See below  Severe mitral regurgitation Cardiology consulted.  Plan is for TEE.  Coronary artery disease Has a drug-eluting stent to circumflex previously.  No active cardiac issues currently.  Chronic diastolic CHF Stable.  Was hypokalemic at admission.  Diabetes mellitus type 2 with hyperglycemia HbA1c 12.7.  Had not been on insulin  in admission.  Was on insulin  previously but then stopped taking them about a year ago.   Currently on glargine.  Will be discharged on ReliOn insulin  due to affordability issues. Continue to monitor CBGs.  History of COPD Respiratory status is stable.  Diabetic neuropathy Continue Neurontin .  Acute nondisplaced fracture of the right fifth metatarsal Continue with boot.  Outpatient follow-up with orthopedics or podiatry.  PT and OT.  Pain control.  Chronic pain syndrome Noted to be on scheduled oxycodone  15 mg 3 times a day.  Prescriber database reviewed and she has been getting this medication from a prescriber in [place] on a regular basis.  Last filled on 7/14, 120 tablets.  Obesity Estimated body mass index is 31.87 kg/m as calculated from the following:   Height as of this encounter: 5' 1 (1.549 m).   Weight as of this encounter: 76.5 kg.   DVT Prophylaxis: Lovenox  Code Status: Full code Family Communication: Discussed with patient Disposition Plan: Hopefully return home when improved     Medications: Scheduled:  arformoterol   15 mcg Nebulization BID   atorvastatin   80 mg Oral  Daily   budesonide  (PULMICORT ) nebulizer solution  0.5 mg Nebulization BID   Chlorhexidine  Gluconate Cloth  6 each Topical Daily   clopidogrel   75 mg Oral Daily   DULoxetine   60 mg Oral Daily   enoxaparin  (LOVENOX ) injection  40 mg  Subcutaneous Q24H   fenofibrate   54 mg Oral Daily   gabapentin   100 mg Oral Q breakfast   insulin  aspart  0-15 Units Subcutaneous Q4H   insulin  glargine-yfgn  30 Units Subcutaneous Daily   oxycodone   15 mg Oral TID AC & HS   Ensure Max Protein  11 oz Oral Daily   revefenacin   175 mcg Nebulization Daily   Continuous:  promethazine  (PHENERGAN ) injection (IM or IVPB) 12.5 mg (04/04/24 2023)   PRN:dextrose , docusate sodium , HYDROmorphone , mouth rinse, polyethylene glycol, promethazine  (PHENERGAN ) injection (IM or IVPB)   Objective:  Vital Signs  Vitals:   04/05/24 0337 04/05/24 0506 04/05/24 0513 04/05/24 0734  BP: (!) 111/58  109/65 (!) 113/57  Pulse: 61 73 75 71  Resp: 12 13 13 13   Temp: 97.9 F (36.6 C)   99.3 F (37.4 C)  TempSrc: Oral   Oral  SpO2: 95% 94% 97% 95%  Weight:   76.5 kg   Height:        Intake/Output Summary (Last 24 hours) at 04/05/2024 0820 Last data filed at 04/04/2024 2026 Gross per 24 hour  Intake 531 ml  Output 800 ml  Net -269 ml   Filed Weights   04/03/24 0500 04/04/24 0334 04/05/24 0513  Weight: 76.7 kg 78.8 kg 76.5 kg    General appearance: Awake alert.  In no distress Resp: Clear to auscultation bilaterally.  Normal effort Cardio: S1-S2 is normal regular.  No S3-S4.  Systolic murmur appreciated over the precordium. GI: Abdomen is soft.  Nontender nondistended.  Bowel sounds are present normal.  No masses organomegaly   Lab Results:  Data Reviewed: I have personally reviewed following labs and reports of the imaging studies  CBC: Recent Labs  Lab 04/01/24 1636 04/01/24 1809 04/01/24 2136 04/02/24 0259 04/02/24 0630 04/05/24 0149  WBC 8.9  --  12.2* 11.1*  --  5.7  NEUTROABS 6.3  --   --   --   --   --   HGB 12.1 12.9 13.1 12.9 12.2 12.4  HCT 35.0* 36.7 37.5 36.4 36.0 37.0  MCV 91.6  --  91.9 91.5  --  94.4  PLT 166  --  175 175  --  148*    Basic Metabolic Panel: Recent Labs  Lab 04/01/24 1636 04/01/24 2136 04/02/24 0259  04/02/24 0630 04/03/24 0400 04/04/24 0218 04/05/24 0149  NA 134*  --  141 142 139 138 137  K 3.3*  --  4.0 3.3* 3.8 4.4 4.7  CL 97*  --  107  --  106 101 103  CO2 27  --  27  --  26 31 27   GLUCOSE 472*  --  123*  --  109* 97 88  BUN 17  --  11  --  9 11 14   CREATININE 1.04*  --  0.61  --  0.64 0.71 0.78  CALCIUM  8.5*  --  8.8*  --  8.8* 8.8* 9.2  MG  --   --  1.5*  --   --   --   --   PHOS  --  3.1 2.6  --   --   --   --     GFR: Estimated Creatinine  Clearance: 68.3 mL/min (by C-G formula based on SCr of 0.78 mg/dL).  Liver Function Tests: Recent Labs  Lab 04/01/24 1636  AST 21  ALT 45*  ALKPHOS 81  BILITOT 0.4  PROT 5.1*  ALBUMIN  3.2*    Recent Labs  Lab 04/01/24 2136  LIPASE 25  AMYLASE 17*    CBG: Recent Labs  Lab 04/04/24 1642 04/04/24 1939 04/04/24 2338 04/05/24 0335 04/05/24 0816  GLUCAP 85 153* 96 93 176*      Recent Results (from the past 240 hours)  Blood culture (routine x 2)     Status: None (Preliminary result)   Collection Time: 04/01/24  5:55 PM   Specimen: BLOOD RIGHT HAND  Result Value Ref Range Status   Specimen Description BLOOD RIGHT HAND  Final   Special Requests   Final    BOTTLES DRAWN AEROBIC AND ANAEROBIC Blood Culture results may not be optimal due to an inadequate volume of blood received in culture bottles   Culture   Final    NO GROWTH 3 DAYS Performed at [facility], [address] [place], [place] [address]    Report Status PENDING  Incomplete  Blood culture (routine x 2)     Status: None (Preliminary result)   Collection Time: 04/01/24  6:00 PM   Specimen: BLOOD LEFT HAND  Result Value Ref Range Status   Specimen Description BLOOD LEFT HAND  Final   Special Requests   Final    BOTTLES DRAWN AEROBIC AND ANAEROBIC Blood Culture adequate volume   Culture   Final    NO GROWTH 3 DAYS Performed at [facility], [address], [place], [place] [address]    Report Status PENDING  Incomplete  MRSA Next  Gen by PCR, Nasal     Status: None   Collection Time: 04/01/24  8:05 PM   Specimen: Nasal Mucosa; Nasal Swab  Result Value Ref Range Status   MRSA by PCR Next Gen NOT DETECTED NOT DETECTED Final    Comment: (NOTE) The GeneXpert MRSA Assay (FDA approved for NASAL specimens only), is one component of a comprehensive MRSA colonization surveillance program. It is not intended to diagnose MRSA infection nor to guide or monitor treatment for MRSA infections. Test performance is not FDA approved in patients less than 83 years old. Performed at [facility], [address], [place], [place] [address]   Urine Culture (for pregnant, neutropenic or urologic patients or patients with an indwelling urinary catheter)     Status: None   Collection Time: 04/02/24  1:34 AM   Specimen: Urine, Catheterized  Result Value Ref Range Status   Specimen Description URINE, CATHETERIZED  Final   Special Requests NONE  Final   Culture   Final    NO GROWTH Performed at [facility], [address], [place], [place] [address]    Report Status 04/03/2024 FINAL  Final      Radiology Studies: No results found.      LOS: 4 days   Almina Schul [employer] on [email or web address]  04/05/2024, 8:20 AM

## 2024-04-05 NOTE — Plan of Care (Signed)
   Problem: Education: Goal: Ability to describe self-care measures that may prevent or decrease complications (Diabetes Survival Skills Education) will improve Outcome: Progressing Goal: Individualized Educational Video(s) Outcome: Progressing   Problem: Coping: Goal: Ability to adjust to condition or change in health will improve Outcome: Progressing

## 2024-04-06 ENCOUNTER — Encounter (HOSPITAL_COMMUNITY): Payer: Self-pay | Admitting: Pulmonary Disease

## 2024-04-06 ENCOUNTER — Inpatient Hospital Stay (HOSPITAL_COMMUNITY)

## 2024-04-06 ENCOUNTER — Encounter (HOSPITAL_COMMUNITY)
Admission: EM | Disposition: A | Discharge status: Home / Self Care | Payer: Self-pay | Source: Ambulatory Visit | Attending: Internal Medicine

## 2024-04-06 DIAGNOSIS — R55 Syncope and collapse: Secondary | ICD-10-CM | POA: Diagnosis not present

## 2024-04-06 DIAGNOSIS — I340 Nonrheumatic mitral (valve) insufficiency: Secondary | ICD-10-CM

## 2024-04-06 LAB — GLUCOSE, CAPILLARY
Glucose-Capillary: 95 mg/dL (ref 70–99)
Glucose-Capillary: 121 mg/dL — ABNORMAL HIGH (ref 70–99)
Glucose-Capillary: 193 mg/dL — ABNORMAL HIGH (ref 70–99)
Glucose-Capillary: 125 mg/dL — ABNORMAL HIGH (ref 70–99)
Glucose-Capillary: 212 mg/dL — ABNORMAL HIGH (ref 70–99)

## 2024-04-06 LAB — BASIC METABOLIC PANEL WITH GFR
Anion gap: 9 (ref 5–15)
BUN: 17 mg/dL (ref 8–23)
CO2: 25 mmol/L (ref 22–32)
Calcium: 9.2 mg/dL (ref 8.9–10.3)
Chloride: 103 mmol/L (ref 98–111)
Creatinine, Ser: 0.77 mg/dL (ref 0.44–1.00)
GFR, Estimated: >60 mL/min (ref >60)
Glucose, Bld: 120 mg/dL — ABNORMAL HIGH (ref 70–99)
Potassium: 4.5 mmol/L (ref 3.5–5.1)
Sodium: 137 mmol/L (ref 135–145)

## 2024-04-06 LAB — CULTURE, BLOOD (ROUTINE X 2)
Culture: NO GROWTH
Culture: NO GROWTH
Special Requests: ADEQUATE

## 2024-04-06 SURGERY — TRANSESOPHAGEAL ECHOCARDIOGRAM (TEE) (CATHLAB)
Anesthesia: Monitor Anesthesia Care

## 2024-04-06 MED ORDER — SENNOSIDES-DOCUSATE SODIUM 8.6-50 MG PO TABS
2.0000 | ORAL_TABLET | Freq: Two times a day (BID) | ORAL | Status: DC
  Administered 2024-04-06 – 2024-04-07 (×3): 2 via ORAL
  Filled 2024-04-06 (×3): qty 2

## 2024-04-06 MED ORDER — INSULIN ASPART 100 UNIT/ML IJ SOLN
0.0000 [IU] | Freq: Three times a day (TID) | INTRAMUSCULAR | Status: DC
  Administered 2024-04-06: 2 [IU] via SUBCUTANEOUS
  Administered 2024-04-06: 3 [IU] via SUBCUTANEOUS
  Administered 2024-04-07: 2 [IU] via SUBCUTANEOUS

## 2024-04-06 MED ORDER — INSULIN ASPART 100 UNIT/ML IJ SOLN
0.0000 [IU] | Freq: Every day | INTRAMUSCULAR | Status: DC
  Administered 2024-04-06: 2 [IU] via SUBCUTANEOUS

## 2024-04-06 MED ORDER — INSULIN ASPART PROT & ASPART (70-30 MIX) 100 UNIT/ML ~~LOC~~ SUSP
16.0000 [IU] | Freq: Two times a day (BID) | SUBCUTANEOUS | Status: DC
  Administered 2024-04-07: 16 [IU] via SUBCUTANEOUS
  Filled 2024-04-06: qty 10

## 2024-04-06 NOTE — Progress Notes (Signed)
 Physical Therapy Treatment Patient Details Name: Sara Hobbs MRN: [id number] DOB: 1961-07-16 Today's Date: 04/06/2024   History of Present Illness Pt is a 63 year old woman sent from cardiologist's office on 04/02/24 with syncope/presyncope. + hypotension and CBG of 515. Fall at home 7/24 resulting in R 5th MT fx. PMH: DM2 with medical noncompliance, CAD, CABG, PEA arrest, COPD, cardiomyopathy, HTN, HLD, polycythemia, CHF, MDD, chronic back pain, recent R 5th metarsal fx.    PT Comments  Progressing well towards acute functional goals. Able to ambulate >125 feet today with RW at a supervision level. Asymptomatic throughout session on room air. Seated EOB when PT entered room, obtained a standing BP prior to ambulating with BP 105/78. Upon sitting back in bed after walking BP 143/75. Asymptomatic throughout session. HR to 111 max while ambulating. Wearing compression sotckings and abdominal binder. Patient will continue to benefit from skilled physical therapy services to further improve independence with functional mobility.    If plan is discharge home, recommend the following: Assistance with cooking/housework;Assist for transportation;Help with stairs or ramp for entrance   Can travel by private vehicle        Equipment Recommendations  None recommended by PT    Recommendations for Other Services       Precautions / Restrictions Precautions Precautions: Fall;Other (comment) Recall of Precautions/Restrictions: Intact Precaution/Restrictions Comments: watch BP (+orthostatics on eval) Required Braces or Orthoses: Other Brace Other Brace: R post op shoe due to 5th MT fx Restrictions Weight Bearing Restrictions Per Provider Order: No Other Position/Activity Restrictions: Post op shoe for Rt foot     Mobility  Bed Mobility Overal bed mobility: Modified Independent Bed Mobility: Sit to Supine     Supine to sit: Modified independent (Device/Increase time)     General bed  mobility comments: Sitting EOB when PT entered room. Mod I return to supine    Transfers Overall transfer level: Needs assistance Equipment used: Rolling walker (2 wheels) Transfers: Sit to/from Stand Sit to Stand: Contact guard assist           General transfer comment: CGA for safety, slow to rise, redueced WB through RLE. No physical assist needed.    Ambulation/Gait Ambulation/Gait assistance: Supervision Gait Distance (Feet): 145 Feet Assistive device: Rolling walker (2 wheels) Gait Pattern/deviations: Step-through pattern, Decreased stride length, Knee flexed in stance - right, Antalgic Gait velocity: dec Gait velocity interpretation: <1.8 ft/sec, indicate of risk for recurrent falls   General Gait Details: Educated on safe AD use with RW for support to unload RLE for comfort. Stable with this device, moderately antalgic, mostly step through pattern. No buckling or overt LOB. Supervision for safety. Denies dizziness throughout distance.   Stairs             Wheelchair Mobility     Tilt Bed    Modified Rankin (Stroke Patients Only)       Balance Overall balance assessment: Needs assistance Sitting-balance support: No upper extremity supported, Feet supported Sitting balance-Leahy Scale: Good     Standing balance support: Reliant on assistive device for balance, Single extremity supported Standing balance-Leahy Scale: Poor Standing balance comment: reliant on external assist                            Communication Communication Communication: No apparent difficulties  Cognition Arousal: Alert Behavior During Therapy: WFL for tasks assessed/performed   PT - Cognitive impairments: No apparent impairments  Following commands: Intact      Cueing Cueing Techniques: Verbal cues  Exercises      General Comments General comments (skin integrity, edema, etc.): Standing BP start of session 105/78. Upon  sitting back in bed after walking BP 143/75. Asymptomatic throughout session. HR to 111 max while ambulating. Wearing compression sotckings and abdominal binder.      Pertinent Vitals/Pain Pain Assessment Pain Assessment: Faces Faces Pain Scale: Hurts little more Pain Location: Rt foot with WB Pain Descriptors / Indicators: Aching Pain Intervention(s): Limited activity within patient's tolerance, Monitored during session, Repositioned    Home Living                          Prior Function            PT Goals (current goals can now be found in the care plan section) Acute Rehab PT Goals Patient Stated Goal: home, independence PT Goal Formulation: With patient Time For Goal Achievement: 04/17/24 Potential to Achieve Goals: Good Progress towards PT goals: Progressing toward goals    Frequency    Min 2X/week      PT Plan      Co-evaluation              AM-PAC PT 6 Clicks Mobility   Outcome Measure  Help needed turning from your back to your side while in a flat bed without using bedrails?: None Help needed moving from lying on your back to sitting on the side of a flat bed without using bedrails?: None Help needed moving to and from a bed to a chair (including a wheelchair)?: A Little Help needed standing up from a chair using your arms (e.g., wheelchair or bedside chair)?: A Little Help needed to walk in hospital room?: A Little Help needed climbing 3-5 steps with a railing? : A Little 6 Click Score: 20    End of Session Equipment Utilized During Treatment: Gait belt Activity Tolerance: Patient tolerated treatment well Patient left: with call bell/phone within reach;in bed;with bed alarm set (Declined chair) Nurse Communication: Mobility status PT Visit Diagnosis: Difficulty in walking, not elsewhere classified (R26.2);Other abnormalities of gait and mobility (R26.89);Pain Pain - Right/Left: Right Pain - part of body: Ankle and joints of foot      Time: 0922-0946 PT Time Calculation (min) (ACUTE ONLY): 24 min  Charges:    $Gait Training: 8-22 mins $Therapeutic Activity: 8-22 mins PT General Charges $$ ACUTE PT VISIT: 1 Visit                     Leontine Roads, PT, DPT [facility]  Rehabilitation Services Physical Therapist Office: [phone] Website: [email or web address]    Leontine GORMAN Roads 04/06/2024, 10:47 AM

## 2024-04-06 NOTE — Inpatient Diabetes Management (Signed)
 Inpatient Diabetes Program Recommendations  AACE/ADA: New Consensus Statement on Inpatient Glycemic Control (2025)  Target Ranges:  Prepandial:   less than 140 mg/dL      Peak postprandial:   less than 180 mg/dL (1-2 hours)      Critically ill patients:  140 - 180 mg/dL   Lab Results  Component Value Date   GLUCAP 121 (H) 04/06/2024   HGBA1C 12.7 (H) 04/01/2024    Review of Glycemic Control  Latest Reference Range & Units 04/05/24 23:44 04/06/24 04:49 04/06/24 08:19  Glucose-Capillary 70 - 99 mg/dL 88 95 878 (H)   Diabetes history: DM 2 Outpatient Diabetes medications:  Novolog  0-5 units tid with meals as needed for high blood sugars Current orders for Inpatient glycemic control:  Novolog  0-15 units q 4 hours  Semglee  30 units daily  Inpatient Diabetes Program Recommendations:    Discharge Recommendations: Other recommendations: Relion [employer] glucose meter, strips, and lancets Intermediate acting recommendations: insulin  isophane & regular (NOVOLIN  70/30 RELION PEN) ([facility] Only) 16 units bid  Supply/Referral recommendations: Glucometer Test strips Lancet device Lancets Pen needles - standard   Use Adult Diabetes Insulin  Treatment Post Discharge order set.  For discharge, consider Novolin  70/30 16 units twice daily.  (Start on 8/5 if discharged today).   Thanks,  Randall Bullocks, RN, BC-ADM Inpatient Diabetes Coordinator Pager [phone]  (8a-5p)

## 2024-04-06 NOTE — Progress Notes (Signed)
 [facility] PROGRESS NOTE   Sara Hobbs FMW:[id number] DOB: 30-Apr-1961 DOA: 04/01/2024  PCP: Dsa, Joylin G, MD  Brief History: 63 year old female patient with a complicated medical history including coronary artery disease dating back to 2017 with prior drug-eluting stent to the circumflex. Ischemic cardiomyopathy with a EF 55 to 60% and grade 1 diastolic dysfunction, as well as trivial MR, tobacco abuse, COPD who was recently seen in the emergency department on 7/24 after a fall when she was diagnosed with right fifth metatarsal fracture which was nondisplaced.  She presented to cardiology office for routine follow-up and at that time was noted to be diaphoretic and lethargic.  She was noted to have hypotension.  She was sent to the emergency department and then admitted to the ICU.  She required pressors.  She was stabilized and then transferred to floor.  Consultants: Critical care medicine.  Cardiology.  Procedures: Echocardiogram    Subjective/Interval History: Patient not very communicative this morning.  Denies any chest pain or shortness of breath.  No nausea or vomiting.  Did get dizzy overnight when she was stood up for blood pressures.  Has not ambulated yet.   Assessment/Plan:  Syncope versus presyncope Thought to be secondary to orthostatic hypotension.  Thought to be secondary to diarrhea that she had a few days prior to admission.  No further syncopal episodes in the hospital. CT scan of the chest abdomen pelvis was done which raised concern for colitis.  Left lower lobe pulmonary nodule was unchanged from before. Lower extremity Doppler studies negative for DVT.   Does not have any symptoms of colitis currently. No further episodes of syncope.  Stable for the most part.  Orthostatic hypotension Thought to be due to hypovolemia.  However despite aggressive hydration including use of pressors or orthostatic symptoms have not improved.  Remains orthostatic.   Cortisol level was normal. Patient was started on compression stockings and abdominal binder.  Some improvement noted in orthostatic vital signs though blood pressure does occasionally decrease in standing position. Continue with compression stockings and abdominal binder.  Antihypertensives remain on hold. Echocardiogram suggest severe mitral regurgitation.  Previously this was mild to moderate.  No evidence of pulmonary edema.  See below PT to reevaluate and ambulate patient.  Severe mitral regurgitation Cardiology consulted.  Initially plan was for TEE but after further assessment by cardiology this is not thought to be necessary at this time.  Can be pursued in the outpatient setting.  Coronary artery disease Has a drug-eluting stent to circumflex previously.  No active cardiac issues currently.  Chronic diastolic CHF Stable.  Was hypokalemic at admission which is now resolved.  Diabetes mellitus type 2 with hyperglycemia HbA1c 12.7.  Had not been on insulin  in admission.  Was on insulin  previously but then stopped taking them about a year ago.   Currently on glargine.  Will be discharged on ReliOn insulin  due to affordability issues. Continue to monitor CBGs.  History of COPD Respiratory status is stable.  Diabetic neuropathy Continue Neurontin .  Acute nondisplaced fracture of the right fifth metatarsal Continue with boot.  Outpatient follow-up with orthopedics or podiatry.  PT and OT.  Pain control.  Chronic pain syndrome Noted to be on scheduled oxycodone  15 mg 3 times a day.  Prescriber database reviewed and she has been getting this medication from a prescriber in [place] on a regular basis.  Last filled on 7/14, 120 tablets.  Bowel regimen.  Obesity Estimated body mass index is 30.99 kg/m as  calculated from the following:   Height as of this encounter: 5' 1 (1.549 m).   Weight as of this encounter: 74.4 kg.   DVT Prophylaxis: Lovenox  Code Status: Full  code Family Communication: Discussed with patient Disposition Plan: Hopefully return home when improved     Medications: Scheduled:  arformoterol   15 mcg Nebulization BID   ascorbic acid   500 mg Oral Daily   atorvastatin   80 mg Oral Daily   budesonide  (PULMICORT ) nebulizer solution  0.5 mg Nebulization BID   Chlorhexidine  Gluconate Cloth  6 each Topical Daily   clopidogrel   75 mg Oral Daily   DULoxetine   60 mg Oral Daily   enoxaparin  (LOVENOX ) injection  40 mg Subcutaneous Q24H   fenofibrate   54 mg Oral Daily   gabapentin   100 mg Oral Q breakfast   insulin  aspart  0-15 Units Subcutaneous Q4H   insulin  glargine-yfgn  30 Units Subcutaneous Daily   oxycodone   15 mg Oral TID AC & HS   Ensure Max Protein  11 oz Oral Daily   revefenacin   175 mcg Nebulization Daily   Continuous:  promethazine  (PHENERGAN ) injection (IM or IVPB) 12.5 mg (04/05/24 1525)   PRN:dextrose , docusate sodium , HYDROmorphone , mouth rinse, polyethylene glycol, promethazine  (PHENERGAN ) injection (IM or IVPB)   Objective:  Vital Signs  Vitals:   04/06/24 0648 04/06/24 0652 04/06/24 0816 04/06/24 0859  BP: 119/61 102/71 117/67   Pulse: 68 89 65   Resp:   13   Temp:   98.1 F (36.7 C)   TempSrc:   Oral   SpO2: 94% 97% 92% 97%  Weight:      Height:        Intake/Output Summary (Last 24 hours) at 04/06/2024 0909 Last data filed at 04/06/2024 0635 Gross per 24 hour  Intake 480 ml  Output 4401 ml  Net -3921 ml   Filed Weights   04/04/24 0334 04/05/24 0513 04/06/24 0542  Weight: 78.8 kg 76.5 kg 74.4 kg    General appearance: Awake alert.  In no distress Resp: Clear to auscultation bilaterally.  Normal effort Cardio: S1-S2 is normal regular.  No S3-S4.  No rubs murmurs or bruit GI: Abdomen is soft.  Nontender nondistended.  Bowel sounds are present normal.  No masses organomegaly Extremities: No edema.  Full range of motion of lower extremities. Neurologic: Alert and oriented x3.  No focal neurological  deficits.     Lab Results:  Data Reviewed: I have personally reviewed following labs and reports of the imaging studies  CBC: Recent Labs  Lab 04/01/24 1636 04/01/24 1809 04/01/24 2136 04/02/24 0259 04/02/24 0630 04/05/24 0149  WBC 8.9  --  12.2* 11.1*  --  5.7  NEUTROABS 6.3  --   --   --   --   --   HGB 12.1 12.9 13.1 12.9 12.2 12.4  HCT 35.0* 36.7 37.5 36.4 36.0 37.0  MCV 91.6  --  91.9 91.5  --  94.4  PLT 166  --  175 175  --  148*    Basic Metabolic Panel: Recent Labs  Lab 04/01/24 2136 04/02/24 0259 04/02/24 0630 04/03/24 0400 04/04/24 0218 04/05/24 0149 04/06/24 0207  NA  --  141 142 139 138 137 137  K  --  4.0 3.3* 3.8 4.4 4.7 4.5  CL  --  107  --  106 101 103 103  CO2  --  27  --  26 31 27 25   GLUCOSE  --  123*  --  109* 97 88 120*  BUN  --  11  --  9 11 14 17   CREATININE  --  0.61  --  0.64 0.71 0.78 0.77  CALCIUM   --  8.8*  --  8.8* 8.8* 9.2 9.2  MG  --  1.5*  --   --   --   --   --   PHOS 3.1 2.6  --   --   --   --   --     GFR: Estimated Creatinine Clearance: 67.2 mL/min (by C-G formula based on SCr of 0.77 mg/dL).  Liver Function Tests: Recent Labs  Lab 04/01/24 1636  AST 21  ALT 45*  ALKPHOS 81  BILITOT 0.4  PROT 5.1*  ALBUMIN  3.2*    Recent Labs  Lab 04/01/24 2136  LIPASE 25  AMYLASE 17*    CBG: Recent Labs  Lab 04/05/24 1818 04/05/24 2105 04/05/24 2344 04/06/24 0449 04/06/24 0819  GLUCAP 183* 152* 88 95 121*      Recent Results (from the past 240 hours)  Blood culture (routine x 2)     Status: None   Collection Time: 04/01/24  5:55 PM   Specimen: BLOOD RIGHT HAND  Result Value Ref Range Status   Specimen Description BLOOD RIGHT HAND  Final   Special Requests   Final    BOTTLES DRAWN AEROBIC AND ANAEROBIC Blood Culture results may not be optimal due to an inadequate volume of blood received in culture bottles   Culture   Final    NO GROWTH 5 DAYS Performed at [facility], [address],  [place], [place] [address]    Report Status 04/06/2024 FINAL  Final  Blood culture (routine x 2)     Status: None   Collection Time: 04/01/24  6:00 PM   Specimen: BLOOD LEFT HAND  Result Value Ref Range Status   Specimen Description BLOOD LEFT HAND  Final   Special Requests   Final    BOTTLES DRAWN AEROBIC AND ANAEROBIC Blood Culture adequate volume   Culture   Final    NO GROWTH 5 DAYS Performed at [facility], [address], [place], [place] [address]    Report Status 04/06/2024 FINAL  Final  MRSA Next Gen by PCR, Nasal     Status: None   Collection Time: 04/01/24  8:05 PM   Specimen: Nasal Mucosa; Nasal Swab  Result Value Ref Range Status   MRSA by PCR Next Gen NOT DETECTED NOT DETECTED Final    Comment: (NOTE) The GeneXpert MRSA Assay (FDA approved for NASAL specimens only), is one component of a comprehensive MRSA colonization surveillance program. It is not intended to diagnose MRSA infection nor to guide or monitor treatment for MRSA infections. Test performance is not FDA approved in patients less than 6 years old. Performed at [facility], [address] [place], [place] [address]   Urine Culture (for pregnant, neutropenic or urologic patients or patients with an indwelling urinary catheter)     Status: None   Collection Time: 04/02/24  1:34 AM   Specimen: Urine, Catheterized  Result Value Ref Range Status   Specimen Description URINE, CATHETERIZED  Final   Special Requests NONE  Final   Culture   Final    NO GROWTH Performed at [facility], [address] [place], [place] [address]    Report Status 04/03/2024 FINAL  Final      Radiology Studies: No results found.  LOS: 5 days   Bradlee Heitman [employer] on [email or web address]  04/06/2024, 9:09 AM

## 2024-04-06 NOTE — Progress Notes (Signed)
  Progress Note  Patient Name: Sara Hobbs Date of Encounter: 04/06/2024 [facility] Cardiologist: Ozell Fell, MD   Interval Summary   Patient doing well this morning.  No chest pain or shortness of breath.  Vital Signs Vitals:   04/06/24 0644 04/06/24 0647 04/06/24 0648 04/06/24 0652  BP:  128/63 119/61 102/71  Pulse: 68 75 68 89  Resp:      Temp:      TempSrc:      SpO2: 93% 96% 94% 97%  Weight:      Height:        Intake/Output Summary (Last 24 hours) at 04/06/2024 0807 Last data filed at 04/06/2024 9364 Gross per 24 hour  Intake 720 ml  Output 4501 ml  Net -3781 ml      04/06/2024    5:42 AM 04/05/2024    5:13 AM 04/04/2024    3:34 AM  Last 3 Weights  Weight (lbs) 164 lb 0.4 oz 168 lb 10.4 oz 173 lb 12.8 oz  Weight (kg) 74.4 kg 76.5 kg 78.835 kg      Telemetry/ECG  Sinus rhythm without significant arrhythmia- Personally Reviewed  Physical Exam  GEN: No acute distress.   Neck: No JVD Cardiac: RRR, 2/6 ejection murmur at the right upper sternal border Respiratory: Clear to auscultation bilaterally. GI: Soft, nontender, non-distended  MS: No edema  Assessment & Plan  1.  Syncope/presyncope: Patient hypotensive on admission. Hypovolemia was suspected.  Her antihypertensive medications and diuretics have been held.  Patient also on multiple medicines that can cause orthostasis including Cymbalta , Lyrica, and sertraline .  Blood pressure remains low/labile.  Reviewing blood pressure readings overnight, she had a blood pressure as low as 74/37 with standing.  Her blood pressure this morning is 102/71.  Patient remains off of all heart failure medications.  She has had some chronic weakness and dizziness with history of falls.  Recommend continue to hold cardiac medications, mobilize with physical therapy. 2.  Severe mitral regurgitation: I reviewed the patient's echocardiogram and she has central mitral regurgitation.  By my estimate it is moderate to severe.  I  do not think this has anything to do with her hospitalization for presyncope.  I do not appreciate a murmur or mitral regurgitation on her exam.  She has no progressive symptoms of heart failure, orthopnea, or worsening dyspnea.  I think this can be deferred to the outpatient setting and she does not require an inpatient transesophageal echo.  Medical management for now. 3.  Coronary artery disease with angina: Chronic angina, currently with antianginals on hold due to problem #1.  High-sensitivity troponins are negative.  No active ischemic symptoms noted. 4.  Chronic HFpEF: Has been euvolemic or hypovolemic.  Diuretics and heart failure medications on hold.  Recommendations: Continue to hold cardiac medicines due to low blood pressure/orthostasis.  Mobilize, physical therapy assessment.  For questions or updates, please contact [facility] Please consult [email or web address] for contact info under       Signed, Ozell Fell, MD

## 2024-04-06 NOTE — Progress Notes (Signed)
 Occupational Therapy Treatment Patient Details Name: Sara Hobbs MRN: [id number] DOB: 1961-02-22 Today's Date: 04/06/2024   History of present illness Pt is a 63 year old woman sent from cardiologist's office on 04/02/24 with syncope/presyncope. + hypotension and CBG of 515. Fall at home 7/24 resulting in R 5th MT fx. PMH: DM2 with medical noncompliance, CAD, CABG, PEA arrest, COPD, cardiomyopathy, HTN, HLD, polycythemia, CHF, MDD, chronic back pain, recent R 5th metarsal fx.   OT comments  Pt at or near baseline at this time from OT standpoint. Goals met. OT to sign off acutely. Pt and daughter agreeable and understanding of OT discharge. Recommendation for d/c home with family.       If plan is discharge home, recommend the following:  A little help with walking and/or transfers;A little help with bathing/dressing/bathroom;Assistance with cooking/housework;Assist for transportation;Help with stairs or ramp for entrance   Equipment Recommendations       Recommendations for Other Services      Precautions / Restrictions Precautions Precautions: Fall;Other (comment) Recall of Precautions/Restrictions: Intact Precaution/Restrictions Comments: watch BP (+orthostatics on eval) Required Braces or Orthoses: Other Brace Other Brace: R post op shoe due to 5th MT fx       Mobility Bed Mobility Overal bed mobility: Modified Independent                  Transfers Overall transfer level:  (at baseline. pt state probably better than normal)                       Balance                                           ADL either performed or assessed with clinical judgement   ADL Overall ADL's : At baseline                                            Extremity/Trunk Assessment Upper Extremity Assessment Upper Extremity Assessment: Right hand dominant;RUE deficits/detail;LUE deficits/detail RUE Deficits / Details: prior shoulder  flexion deficits- unable to reach top of head. at baseline daughter helps with don of shirts LUE Deficits / Details: prior shoulder flexion deficits- unable to reach top of head. at baseline daughter helps with don of shirts   Lower Extremity Assessment Lower Extremity Assessment: Overall WFL for tasks assessed        Vision       Perception     Praxis     Communication Communication Communication: No apparent difficulties   Cognition Arousal: Alert Behavior During Therapy: WFL for tasks assessed/performed Cognition: No apparent impairments             OT - Cognition Comments: family present and no concners                          Cueing      Exercises Exercises: Other exercises Other Exercises Other Exercises: scapula retraction exercises to go with patients isometric wall slides that she reports doing at home    Shoulder Instructions       General Comments BP stable . pt with abdominal binder and ted hose. educated on don doff and positioning. gave tip to help with plastic  bag if getting ted hose on was concern. advised to take off at night for skin to rest while sleeping    Pertinent Vitals/ Pain       Pain Assessment Pain Assessment: No/denies pain  Home Living                                          Prior Functioning/Environment              Frequency  Min 2X/week        Progress Toward Goals  OT Goals(current goals can now be found in the care plan section)  Progress towards OT goals: Goals met/education completed, patient discharged from OT  Acute Rehab OT Goals OT Goal Formulation: With patient Time For Goal Achievement: 04/17/24 Potential to Achieve Goals: Good ADL Goals Pt Will Perform Grooming: with modified independence;standing Pt Will Perform Lower Body Bathing: with modified independence;sit to/from stand Pt Will Perform Lower Body Dressing: with modified independence;sit to/from stand Pt Will  Transfer to Toilet: with modified independence;ambulating;regular height toilet Pt Will Perform Toileting - Clothing Manipulation and hygiene: with modified independence;sit to/from stand Additional ADL Goal #1: Pt will be aware of availability and use of tub transfer bench.  Plan      Co-evaluation                 AM-PAC OT 6 Clicks Daily Activity     Outcome Measure   Help from another person eating meals?: None Help from another person taking care of personal grooming?: A Little Help from another person toileting, which includes using toliet, bedpan, or urinal?: A Little Help from another person bathing (including washing, rinsing, drying)?: A Little Help from another person to put on and taking off regular upper body clothing?: None Help from another person to put on and taking off regular lower body clothing?: A Little 6 Click Score: 20    End of Session    OT Visit Diagnosis: Unsteadiness on feet (R26.81);Dizziness and giddiness (R42)   Activity Tolerance Patient tolerated treatment well   Patient Left in bed;with call bell/phone within reach;with family/visitor present   Nurse Communication Mobility status;Other (comment)        Time: 8659-8644 OT Time Calculation (min): 15 min  Charges: OT General Charges $OT Visit: 1 Visit OT Treatments $Self Care/Home Management : 8-22 mins   Brynn, OTR/L  Acute Rehabilitation Services Office: [phone] .   Ely Molt 04/06/2024, 2:20 PM

## 2024-04-07 DIAGNOSIS — I959 Hypotension, unspecified: Secondary | ICD-10-CM | POA: Diagnosis not present

## 2024-04-07 DIAGNOSIS — I340 Nonrheumatic mitral (valve) insufficiency: Secondary | ICD-10-CM | POA: Diagnosis not present

## 2024-04-07 LAB — GLUCOSE, CAPILLARY: Glucose-Capillary: 121 mg/dL — ABNORMAL HIGH (ref 70–99)

## 2024-04-07 MED ORDER — BLOOD GLUCOSE TEST VI STRP: 1.0000 | ORAL_STRIP | Freq: Three times a day (TID) | 0 refills | Status: AC

## 2024-04-07 MED ORDER — SENNOSIDES-DOCUSATE SODIUM 8.6-50 MG PO TABS: 2.0000 | ORAL_TABLET | Freq: Two times a day (BID) | ORAL | 0 refills | Status: AC

## 2024-04-07 MED ORDER — NOVOLIN N FLEXPEN RELION 100 UNIT/ML ~~LOC~~ SUPN
16.0000 [IU] | PEN_INJECTOR | Freq: Two times a day (BID) | SUBCUTANEOUS | 0 refills | Status: AC
Start: 2024-04-07 — End: ?

## 2024-04-07 MED ORDER — TORSEMIDE 20 MG PO TABS: 20.0000 mg | ORAL_TABLET | ORAL | 0 refills | Status: DC | PRN

## 2024-04-07 MED ORDER — POLYETHYLENE GLYCOL 3350 17 G PO PACK: 17.0000 g | PACK | Freq: Every day | ORAL | 0 refills | Status: AC | PRN

## 2024-04-07 MED ORDER — PEN NEEDLES 31G X 5 MM MISC: 1.0000 | Freq: Three times a day (TID) | 0 refills | Status: AC

## 2024-04-07 MED ORDER — LANCET DEVICE MISC: 1.0000 | Freq: Three times a day (TID) | 0 refills | Status: AC

## 2024-04-07 MED ORDER — LANCETS MISC
1.0000 | Freq: Three times a day (TID) | 0 refills | Status: AC
Start: 2024-04-07 — End: ?

## 2024-04-07 MED ORDER — BLOOD GLUCOSE MONITORING SUPPL DEVI: 1.0000 | Freq: Three times a day (TID) | 0 refills | Status: AC

## 2024-04-07 NOTE — TOC Transition Note (Signed)
 Transition of Care [facility]) - Discharge Note   Patient Details  Name: Sara Hobbs MRN: [id number] Date of Birth: 06-Jun-1961  [facility] of [facility]) CM/SW Contact:  Waddell Barnie Rama, RN Phone Number: 04/07/2024, 10:05 AM   Clinical Narrative:     For dc today, Diabetic coordinator saw patient and changed insulin  to a more affordable one, per their note. He has transport.    Barriers to Discharge: Continued Medical Work up   Patient Goals and CMS Choice Patient states their goals for this hospitalization and ongoing recovery are:: return home          Discharge Placement                       Discharge Plan and Services Additional resources added to the After Visit Summary for                                       Social Drivers of Health (SDOH) Interventions SDOH Screenings   Food Insecurity: No Food Insecurity (04/02/2024)  Housing: Low Risk  (04/02/2024)  Transportation Needs: No Transportation Needs (04/02/2024)  Utilities: Not At Risk (04/02/2024)  Financial Resource Strain: Low Risk  (09/13/2023)   Received from [facility]  Social Connections: Unknown (01/04/2022)   Received from [facility]  Tobacco Use: High Risk (04/06/2024)     Readmission Risk Interventions     No data to display

## 2024-04-07 NOTE — Progress Notes (Signed)
  Progress Note  Patient Name: Sara Hobbs Date of Encounter: 04/07/2024 [facility] Cardiologist: Ozell Fell, MD   Interval Summary   Feeling well today.  Patient was able to get up and work with physical therapy and Occupational Therapy yesterday.  No further issues with hypotension or presyncope.  Seems to be improved with TED hose and an abdominal binder.  Also off of all of her cardiac medicines.  Vital Signs Vitals:   04/06/24 2325 04/07/24 0552 04/07/24 0803 04/07/24 0839  BP: 126/66 (!) 116/56 114/65   Pulse: 72 70 72   Resp: 10 15 14    Temp: 97.9 F (36.6 C) 98.6 F (37 C) 98 F (36.7 C)   TempSrc: Oral Oral Oral   SpO2: 96% 95% 97% 99%  Weight:  74.9 kg    Height:        Intake/Output Summary (Last 24 hours) at 04/07/2024 0926 Last data filed at 04/07/2024 0805 Gross per 24 hour  Intake 540 ml  Output 2400 ml  Net -1860 ml      04/07/2024    5:52 AM 04/06/2024    5:42 AM 04/05/2024    5:13 AM  Last 3 Weights  Weight (lbs) 165 lb 3.2 oz 164 lb 0.4 oz 168 lb 10.4 oz  Weight (kg) 74.934 kg 74.4 kg 76.5 kg      Telemetry/ECG  Sinus rhythm with no significant arrhythmia- Personally Reviewed  Physical Exam  GEN: No acute distress, chronically ill-appearing.   Neck: No JVD Cardiac: RRR, 2/6 ejection murmur at the right upper sternal border Respiratory: Clear to auscultation bilaterally. GI: Soft, nontender, non-distended  MS: No edema  Assessment & Plan  1.  Syncope with orthostatic hypotension: Patient on multiple medicines as outlined previously including Cymbalta , Lyrica, and sertraline .  Blood pressure has improved significantly with holding her heart failure medications.  Lisinopril , carvedilol , and torsemide  are all on hold.  LVEF is 60 to 65%.  Will keep her off of her medications for now and arrange close cardiology follow-up. 2.  Mitral regurgitation: Reported as severe, appears 3+ (moderate to severe) on my review of her echo images.  Consider  evaluation in the outpatient setting, but likely will manage medically in the setting of her multiple comorbidities. 3.  Coronary artery disease with angina: Continue to hold antianginal drugs.  Negative high-sensitivity troponins and no active ischemic symptoms. 4.  Chronic HFpEF: Remains euvolemic.  Will have her use torsemide  on an as-needed basis.  Recommendations: Patient appears stable from a cardiac standpoint.  Will sign off today.  Will arrange cardiology follow-up.  For now, keep off of cardiac meds including isosorbide , carvedilol , lisinopril , and torsemide .  Would write torsemide  20 mg to be used as needed for edema on discharge.   For questions or updates, please contact [facility] Please consult [email or web address] for contact info under       Signed, Ozell Fell, MD

## 2024-04-07 NOTE — Discharge Summary (Signed)
 [facility]  Physician Discharge Summary   Patient ID: Sara Hobbs MRN: [id number] DOB/AGE: [birth year] 63 y.o.  Admit date: 04/01/2024 Discharge date:   04/07/2024   PCP: Dsa, Joylin G, MD  DISCHARGE DIAGNOSES:    Syncope Orthostatic hypotension   Nonrheumatic mitral (valve) insufficiency Coronary artery disease Chronic diastolic CHF Diabetes mellitus type 2 with hyperglycemia History of COPD     RECOMMENDATIONS FOR OUTPATIENT FOLLOW UP: Follow-up with PCP for further management of diabetes Cardiology will schedule outpatient follow-up Follow-up with podiatry or orthopedics for the right fifth metatarsal fracture   Home Health: None Equipment/Devices: None  CODE STATUS: Full code  DISCHARGE CONDITION: fair  Diet recommendation: Modified carbohydrate  INITIAL HISTORY: 63 year old female patient with a complicated medical history including coronary artery disease dating back to 2017 with prior drug-eluting stent to the circumflex. Ischemic cardiomyopathy with a EF 55 to 60% and grade 1 diastolic dysfunction, as well as trivial MR, tobacco abuse, COPD who was recently seen in the emergency department on 7/24 after a fall when she was diagnosed with right fifth metatarsal fracture which was nondisplaced.  She presented to cardiology office for routine follow-up and at that time was noted to be diaphoretic and lethargic.  She was noted to have hypotension.  She was sent to the emergency department and then admitted to the ICU.  She required pressors.  She was stabilized and then transferred to floor.   Consultants: Critical care medicine.  Cardiology.   Procedures: Echocardiogram  HOSPITAL COURSE:   Syncope versus presyncope Thought to be secondary to orthostatic hypotension.  Thought to be secondary to diarrhea that she had a few days prior to admission.  No further syncopal episodes in the hospital. CT scan of the chest abdomen pelvis was done which raised  concern for colitis.  Does not have any symptoms of colitis currently. Left lower lobe pulmonary nodule was unchanged from before.  Lower extremity Doppler studies negative for DVT.   No further episodes of syncope.  Stable for the most part.   Orthostatic hypotension Thought to be due to hypovolemia.  However despite aggressive hydration including use of pressors or orthostatic symptoms have not improved.  Remains orthostatic.  Cortisol level was normal. Patient was started on compression stockings and abdominal binder.   Orthostatic vital signs have improved.  She was able to ambulate with physical therapy yesterday.   Will keep her antihypertensives on hold at discharge.  Cardiology can resume at outpatient follow-up.   Echocardiogram suggest severe mitral regurgitation.  Previously this was mild to moderate.  No evidence of pulmonary edema.  See below   Severe mitral regurgitation Cardiology consulted.  Initially plan was for TEE but after further assessment by cardiology this is not thought to be necessary at this time.  Can be pursued in the outpatient setting.   Coronary artery disease Has a drug-eluting stent to circumflex previously.  No active cardiac issues currently.   Chronic diastolic CHF Stable.  Was hypokalemic at admission which is now resolved.   Diabetes mellitus type 2 with hyperglycemia HbA1c 12.7.  Had not been on insulin  in admission.  Was on insulin  previously but then stopped taking them about a year ago.   Currently on glargine.  Will be discharged on ReliOn insulin  due to affordability issues.   History of COPD Respiratory status is stable.   Diabetic neuropathy Continue Neurontin .   Acute nondisplaced fracture of the right fifth metatarsal Continue with boot.  Outpatient follow-up with orthopedics or  podiatry.  PT and OT.  Pain control.   Chronic pain syndrome Noted to be on scheduled oxycodone  15 mg 3 times a day.  Prescriber database reviewed and she  has been getting this medication from a prescriber in [place] on a regular basis.  Last filled on 7/14, 120 tablets.  Bowel regimen.   Obesity Estimated body mass index is 30.99 kg/m as calculated from the following:   Height as of this encounter: 5' 1 (1.549 m).   Weight as of this encounter: 74.4 kg.   Patient is stable.  Okay for discharge home today.   PERTINENT LABS:  The results of significant diagnostics from this hospitalization (including imaging, microbiology, ancillary and laboratory) are listed below for reference.    Microbiology: Recent Results (from the past 240 hours)  Blood culture (routine x 2)     Status: None   Collection Time: 04/01/24  5:55 PM   Specimen: BLOOD RIGHT HAND  Result Value Ref Range Status   Specimen Description BLOOD RIGHT HAND  Final   Special Requests   Final    BOTTLES DRAWN AEROBIC AND ANAEROBIC Blood Culture results may not be optimal due to an inadequate volume of blood received in culture bottles   Culture   Final    NO GROWTH 5 DAYS Performed at [facility], [address], [place], [place] [address]    Report Status 04/06/2024 FINAL  Final  Blood culture (routine x 2)     Status: None   Collection Time: 04/01/24  6:00 PM   Specimen: BLOOD LEFT HAND  Result Value Ref Range Status   Specimen Description BLOOD LEFT HAND  Final   Special Requests   Final    BOTTLES DRAWN AEROBIC AND ANAEROBIC Blood Culture adequate volume   Culture   Final    NO GROWTH 5 DAYS Performed at [facility], [address] [place], [place] [address]    Report Status 04/06/2024 FINAL  Final  MRSA Next Gen by PCR, Nasal     Status: None   Collection Time: 04/01/24  8:05 PM   Specimen: Nasal Mucosa; Nasal Swab  Result Value Ref Range Status   MRSA by PCR Next Gen NOT DETECTED NOT DETECTED Final    Comment: (NOTE) The GeneXpert MRSA Assay (FDA approved for NASAL specimens only), is one component of a comprehensive MRSA colonization  surveillance program. It is not intended to diagnose MRSA infection nor to guide or monitor treatment for MRSA infections. Test performance is not FDA approved in patients less than 73 years old. Performed at [facility], [address], [place], [place] [address]   Urine Culture (for pregnant, neutropenic or urologic patients or patients with an indwelling urinary catheter)     Status: None   Collection Time: 04/02/24  1:34 AM   Specimen: Urine, Catheterized  Result Value Ref Range Status   Specimen Description URINE, CATHETERIZED  Final   Special Requests NONE  Final   Culture   Final    NO GROWTH Performed at [facility], [address] [place], [place] [address]    Report Status 04/03/2024 FINAL  Final     Labs:   Basic Metabolic Panel: Recent Labs  Lab 04/01/24 2136 04/02/24 0259 04/02/24 0630 04/03/24 0400 04/04/24 0218 04/05/24 0149 04/06/24 0207  NA  --  141 142 139 138 137 137  K  --  4.0 3.3* 3.8 4.4 4.7 4.5  CL  --  107  --  106 101 103 103  CO2  --  27  --  26 31 27 25   GLUCOSE  --  123*  --  109* 97 88 120*  BUN  --  11  --  9 11 14 17   CREATININE  --  0.61  --  0.64 0.71 0.78 0.77  CALCIUM   --  8.8*  --  8.8* 8.8* 9.2 9.2  MG  --  1.5*  --   --   --   --   --   PHOS 3.1 2.6  --   --   --   --   --    Liver Function Tests: Recent Labs  Lab 04/01/24 1636  AST 21  ALT 45*  ALKPHOS 81  BILITOT 0.4  PROT 5.1*  ALBUMIN  3.2*   Recent Labs  Lab 04/01/24 2136  LIPASE 25  AMYLASE 17*    CBC: Recent Labs  Lab 04/01/24 1636 04/01/24 1809 04/01/24 2136 04/02/24 0259 04/02/24 0630 04/05/24 0149  WBC 8.9  --  12.2* 11.1*  --  5.7  NEUTROABS 6.3  --   --   --   --   --   HGB 12.1 12.9 13.1 12.9 12.2 12.4  HCT 35.0* 36.7 37.5 36.4 36.0 37.0  MCV 91.6  --  91.9 91.5  --  94.4  PLT 166  --  175 175  --  148*    CBG: Recent Labs  Lab 04/06/24 0819 04/06/24 1234 04/06/24 1658 04/06/24 2021 04/07/24 0553  GLUCAP 121* 193*  125* 212* 121*     IMAGING STUDIES VAS US  LOWER EXTREMITY VENOUS (DVT) Result Date: 04/02/2024  Lower Venous DVT Study Patient Name:  LLOYD CULLINAN  Date of Exam:   04/02/2024 Medical Rec #: [id number]       Accession #:    [id number] Date of Birth: 1960-12-03      Patient Gender: F Patient Age:   69 years Exam Location:  [facility] Procedure:      VAS US  LOWER EXTREMITY VENOUS (DVT) Referring Phys: MAUDE BABCOCK --------------------------------------------------------------------------------  Indications: SOB, Hypotension. Diabetic, non compliant with insulin  > 1 year. Glucose >400 on admission. Lateral calf pain, ankle swelling.  Risk Factors: Status post fall 03/26/24 with right fifth metatarsal fracture. Comparison Study: Prior negative bilateral LEV done 06/19/16 Performing Technologist: Alberta Lis RVS  Examination Guidelines: A complete evaluation includes B-mode imaging, spectral Doppler, color Doppler, and power Doppler as needed of all accessible portions of each vessel. Bilateral testing is considered an integral part of a complete examination. Limited examinations for reoccurring indications may be performed as noted. The reflux portion of the exam is performed with the patient in reverse Trendelenburg.  +---------+---------------+---------+-----------+----------+--------------+ RIGHT    CompressibilityPhasicitySpontaneityPropertiesThrombus Aging +---------+---------------+---------+-----------+----------+--------------+ CFV      Full           Yes      Yes                                 +---------+---------------+---------+-----------+----------+--------------+ SFJ      Full                                                        +---------+---------------+---------+-----------+----------+--------------+ FV Prox  Full                                                        +---------+---------------+---------+-----------+----------+--------------+  FV Mid    Full                                                        +---------+---------------+---------+-----------+----------+--------------+ FV DistalFull                                                        +---------+---------------+---------+-----------+----------+--------------+ PFV      Full                                                        +---------+---------------+---------+-----------+----------+--------------+ POP      Full           Yes      Yes                                 +---------+---------------+---------+-----------+----------+--------------+ PTV      Full                                                        +---------+---------------+---------+-----------+----------+--------------+ PERO     Full                                                        +---------+---------------+---------+-----------+----------+--------------+ Gastroc  Full                                                        +---------+---------------+---------+-----------+----------+--------------+ ATV      Full                                                        +---------+---------------+---------+-----------+----------+--------------+   +----+---------------+---------+-----------+----------+--------------+ LEFTCompressibilityPhasicitySpontaneityPropertiesThrombus Aging +----+---------------+---------+-----------+----------+--------------+ CFV Full           Yes      Yes                                 +----+---------------+---------+-----------+----------+--------------+ SFJ Full                                                        +----+---------------+---------+-----------+----------+--------------+  Summary: RIGHT: - There is no evidence of deep vein thrombosis in the lower extremity.  - No cystic structure found in the popliteal fossa.  LEFT: - No evidence of common femoral vein obstruction.   *See table(s) above for measurements and  observations. Electronically signed by Lonni Gaskins MD on 04/02/2024 at 3:50:34 PM.    Final    ECHOCARDIOGRAM COMPLETE Result Date: 04/02/2024    ECHOCARDIOGRAM REPORT   Patient Name:   CAMILLA SKEEN Date of Exam: 04/02/2024 Medical Rec #:  [id number]      Height:       61.0 in Accession #:    [id number]     Weight:       163.1 lb Date of Birth:  Sep 18, 1960     BSA:          1.732 m Patient Age:    62 years       BP:           102/58 mmHg Patient Gender: F              HR:           69 bpm. Exam Location:  Inpatient Procedure: 2D Echo, 3D Echo, Cardiac Doppler and Color Doppler (Both Spectral            and Color Flow Doppler were utilized during procedure). Indications:    Shock  History:        Patient has prior history of Echocardiogram examinations, most                 recent 03/27/2024. CAD, Signs/Symptoms:Shortness of Breath; Risk                 Factors:Hypertension and Dyslipidemia.  Sonographer:    Philomena Daring Referring Phys: [id number] PETER E BABCOCK IMPRESSIONS  1. Left ventricular ejection fraction, by estimation, is 60 to 65%. The left ventricle has normal function. The left ventricle has no regional wall motion abnormalities. Left ventricular diastolic parameters were normal.  2. Right ventricular systolic function is normal. The right ventricular size is normal.  3. The mitral valve is moderately thickened and caclified with severe central MR. The mitral valve is abnormal. Severe mitral valve regurgitation. No evidence of mitral stenosis.  4. The aortic valve is tricuspid. There is mild calcification of the aortic valve. Aortic valve regurgitation is not visualized. Aortic valve sclerosis/calcification is present, without any evidence of aortic stenosis.  5. The inferior vena cava is dilated in size with >50% respiratory variability, suggesting right atrial pressure of 8 mmHg. Conclusion(s)/Recommendation(s): The mitral valve is moderately thickened and caclified with severe central MR. Consider  TEE to more fully evalaute if clinically indicated. FINDINGS  Left Ventricle: Left ventricular ejection fraction, by estimation, is 60 to 65%. The left ventricle has normal function. The left ventricle has no regional wall motion abnormalities. The left ventricular internal cavity size was normal in size. There is  no left ventricular hypertrophy. Left ventricular diastolic parameters were normal. Right Ventricle: The right ventricular size is normal. No increase in right ventricular wall thickness. Right ventricular systolic function is normal. Left Atrium: Left atrial size was normal in size. Right Atrium: Right atrial size was normal in size. Pericardium: There is no evidence of pericardial effusion. Mitral Valve: The mitral valve is moderately thickened and caclified with severe central MR. The mitral valve is abnormal. There is moderate thickening of the mitral valve leaflet(s). There is mild calcification of the mitral valve leaflet(s). Severe  mitral valve regurgitation, with centrally-directed jet. No evidence of mitral valve stenosis. Tricuspid Valve: The tricuspid valve is normal in structure. Tricuspid valve regurgitation is trivial. No evidence of tricuspid stenosis. Aortic Valve: The aortic valve is tricuspid. There is mild calcification of the aortic valve. Aortic valve regurgitation is not visualized. Aortic valve sclerosis/calcification is present, without any evidence of aortic stenosis. Pulmonic Valve: The pulmonic valve was normal in structure. Pulmonic valve regurgitation is not visualized. No evidence of pulmonic stenosis. Aorta: The aortic root is normal in size and structure. Venous: The inferior vena cava is dilated in size with greater than 50% respiratory variability, suggesting right atrial pressure of 8 mmHg. IAS/Shunts: No atrial level shunt detected by color flow Doppler.  LEFT VENTRICLE PLAX 2D LVIDd:         4.58 cm   Diastology LVIDs:         3.21 cm   LV e' medial:    6.96 cm/s LV  PW:         0.90 cm   LV E/e' medial:  18.8 LV IVS:        1.00 cm   LV e' lateral:   10.80 cm/s LVOT diam:     1.83 cm   LV E/e' lateral: 12.1 LV SV:         57 LV SV Index:   33 LVOT Area:     2.63 cm  RIGHT VENTRICLE            IVC RV S prime:     9.25 cm/s  IVC diam: 1.98 cm TAPSE (M-mode): 1.7 cm LEFT ATRIUM             Index        RIGHT ATRIUM           Index LA diam:        3.97 cm 2.29 cm/m   RA Area:     11.60 cm LA Vol (A2C):   39.8 ml 22.98 ml/m  RA Volume:   23.70 ml  13.68 ml/m LA Vol (A4C):   34.0 ml 19.63 ml/m LA Biplane Vol: 37.2 ml 21.48 ml/m  AORTIC VALVE LVOT Vmax:   96.40 cm/s LVOT Vmean:  64.500 cm/s LVOT VTI:    0.215 m  AORTA Ao Root diam: 2.66 cm Ao Asc diam:  2.79 cm MITRAL VALVE MV Area (PHT): 4.15 cm     SHUNTS MV Decel Time: 183 msec     Systemic VTI:  0.22 m MV E velocity: 131.00 cm/s  Systemic Diam: 1.83 cm MV A velocity: 127.00 cm/s MV E/A ratio:  1.03 Toribio Fuel MD Electronically signed by Toribio Fuel MD Signature Date/Time: 04/02/2024/10:03:40 AM    Final    CT CHEST ABDOMEN PELVIS W CONTRAST Result Date: 04/01/2024 CLINICAL DATA:  Sepsis. EXAM: CT CHEST, ABDOMEN, AND PELVIS WITH CONTRAST TECHNIQUE: Multidetector CT imaging of the chest, abdomen and pelvis was performed following the standard protocol during bolus administration of intravenous contrast. RADIATION DOSE REDUCTION: This exam was performed according to the departmental dose-optimization program which includes automated exposure control, adjustment of the mA and/or kV according to patient size and/or use of iterative reconstruction technique. CONTRAST:  75mL OMNIPAQUE  IOHEXOL  350 MG/ML SOLN COMPARISON:  11/22/2014. FINDINGS: CT CHEST FINDINGS Cardiovascular: The heart is normal in size and there is no pericardial effusion. Three-vessel coronary artery calcifications are noted. There is atherosclerotic calcification of the aorta without evidence of aneurysm. The pulmonary trunk is normal in caliber.  Mediastinum/Nodes: No mediastinal, hilar, or axillary lymphadenopathy is seen. Coarse calcifications and subcentimeter nodules are noted in the thyroid gland. The trachea and esophagus are within normal limits. Lungs/Pleura: Atelectasis and/or scarring is noted bilaterally. No effusion or pneumothorax is seen. A 3 mm and a 5 mm nodule are present in the left lower lobe, unchanged from 2016. No new nodule is seen. Musculoskeletal: Sternotomy wires are noted. An old healed rib fractures present on the right. No acute osseous abnormality. CT ABDOMEN PELVIS FINDINGS Hepatobiliary: No focal liver abnormality is seen. Fatty infiltration of the liver is noted. Status post cholecystectomy. There is more mild biliary ductal dilation measuring 1.1 cm. Pancreas: Unremarkable. No pancreatic ductal dilatation or surrounding inflammatory changes. Spleen: Normal in size without focal abnormality. Adrenals/Urinary Tract: The adrenal glands are within normal limits. The kidneys enhance symmetrically. No renal calculus or hydronephrosis is seen bilaterally. Subcentimeter hypodensities are present in the left kidney which are too small to further characterize. The bladder is unremarkable. Stomach/Bowel: The stomach is within normal limits. No bowel obstruction, free air, or pneumatosis is seen. A moderate amount of retained stools present in the colon. There is multifocal segmental colonic wall thickening with associated fat stranding, most pronounced at the descending colon. Appendix appears normal. Vascular/Lymphatic: Aortic atherosclerosis. No enlarged abdominal or pelvic lymph nodes. Reproductive: Status post hysterectomy. No adnexal masses. Other: No abdominopelvic ascites. Musculoskeletal: No acute osseous abnormality. IMPRESSION: 1. Findings compatible with colitis. No bowel obstruction or free air is seen. 2. Status post cholecystectomy with mild biliary ductal dilatation. The common bile duct measures up to 1.1 cm. 3. Left  lower lobe pulmonary nodules measuring up to 5 mm, unchanged from 2016 and likely benign. 4. Coronary artery calcifications. 5. Aortic atherosclerosis. Electronically Signed   By: Leita Birmingham M.D.   On: 04/01/2024 19:20   DG Tibia/Fibula Right Result Date: 03/26/2024 CLINICAL DATA:  Status post fall 1 month ago with subsequent right foot swelling. EXAM: RIGHT TIBIA AND FIBULA - 2 VIEW COMPARISON:  None Available. FINDINGS: There is no evidence of fracture or other focal bone lesions. Radiopaque surgical clips are seen within the soft tissues of the posterior and medial right knee. Soft tissues are otherwise unremarkable. IMPRESSION: No acute osseous abnormality. Electronically Signed   By: Suzen Dials M.D.   On: 03/26/2024 21:39   DG Foot Complete Right Result Date: 03/26/2024 CLINICAL DATA:  Status post fall 1 week ago with subsequent right foot swelling. EXAM: RIGHT FOOT COMPLETE - 3+ VIEW COMPARISON:  None Available. FINDINGS: An acute nondisplaced fracture deformity is seen involving the mid to distal aspect of the fifth right metatarsal. There is no evidence of dislocation. There is no evidence of arthropathy or other focal bone abnormality. Moderate severity soft tissue swelling is seen along the dorsal aspect of the distal right foot. IMPRESSION: Acute nondisplaced fracture of the fifth right metatarsal. Electronically Signed   By: Suzen Dials M.D.   On: 03/26/2024 21:23    DISCHARGE EXAMINATION: Vitals:   04/06/24 2325 04/07/24 0552 04/07/24 0803 04/07/24 0839  BP: 126/66 (!) 116/56 114/65   Pulse: 72 70 72   Resp: 10 15 14    Temp: 97.9 F (36.6 C) 98.6 F (37 C) 98 F (36.7 C)   TempSrc: Oral Oral Oral   SpO2: 96% 95% 97% 99%  Weight:  74.9 kg    Height:       General appearance: Awake alert.  In no distress Resp: Clear to auscultation bilaterally.  Normal  effort Cardio: S1-S2 is normal regular.  No S3-S4.  No rubs murmurs or bruit GI: Abdomen is soft.  Nontender  nondistended.  Bowel sounds are present normal.  No masses organomegaly  DISPOSITION: Home  Discharge Instructions     Call MD for:  difficulty breathing, headache or visual disturbances   Complete by: As directed    Call MD for:  extreme fatigue   Complete by: As directed    Call MD for:  persistant dizziness or light-headedness   Complete by: As directed    Call MD for:  persistant nausea and vomiting   Complete by: As directed    Call MD for:  severe uncontrolled pain   Complete by: As directed    Call MD for:  temperature >100.4   Complete by: As directed    Diet - low sodium heart healthy   Complete by: As directed    Discharge instructions   Complete by: As directed    Cardiology will arrange outpatient follow-up.  In the meantime follow-up with your primary care provider within 1 week after discharge.  Prescriptions have been sent to [facility].  You were cared for by a hospitalist during your hospital stay. If you have any questions about your discharge medications or the care you received while you were in the hospital after you are discharged, you can call the unit and asked to speak with the hospitalist on call if the hospitalist that took care of you is not available. Once you are discharged, your primary care physician will handle any further medical issues. Please note that NO REFILLS for any discharge medications will be authorized once you are discharged, as it is imperative that you return to your primary care physician (or establish a relationship with a primary care physician if you do not have one) for your aftercare needs so that they can reassess your need for medications and monitor your lab values. If you do not have a primary care physician, you can call [phone] for a physician referral.   Increase activity slowly   Complete by: As directed          Allergies as of 04/07/2024       Reactions   Wellbutrin [bupropion] Other (See Comments), Palpitations   Makes  me feel like im having a heart attack SEVERE CHEST PAIN        Medication List     STOP taking these medications    carvedilol  3.125 MG tablet Commonly known as: COREG    isosorbide  mononitrate 60 MG 24 hr tablet Commonly known as: IMDUR    lisinopril  2.5 MG tablet Commonly known as: ZESTRIL        TAKE these medications    albuterol  108 (90 Base) MCG/ACT inhaler Commonly known as: VENTOLIN  HFA Inhale 2 puffs into the lungs every 6 (six) hours as needed for wheezing or shortness of breath.   ascorbic acid  500 MG tablet Commonly known as: VITAMIN C  Take 500 mg by mouth daily.   atorvastatin  80 MG tablet Commonly known as: LIPITOR  Take 1 tablet (80 mg total) by mouth daily.   B-12 PO Take 1 tablet by mouth daily.   Blood Glucose Monitoring Suppl Devi 1 each by Does not apply route 3 (three) times daily. May dispense any manufacturer covered by patient's insurance.   BLOOD GLUCOSE TEST STRIPS Strp 1 each by Does not apply route 3 (three) times daily. Use as directed to check blood sugar. May dispense any manufacturer covered by patient's insurance  and fits patient's device.   clopidogrel  75 MG tablet Commonly known as: PLAVIX  Take 1 tablet (75 mg total) by mouth daily.   DULoxetine  60 MG capsule Commonly known as: CYMBALTA  Take 60 mg by mouth daily.   fenofibrate  54 MG tablet Take 54 mg by mouth daily.   Fluticasone-Salmeterol 100-50 MCG/DOSE Aepb Commonly known as: ADVAIR Inhale 1 puff into the lungs 2 (two) times daily as needed (shortness of breath).   insulin  aspart 100 UNIT/ML FlexPen Commonly known as: NOVOLOG  Inject 0-5 Units into the skin 3 (three) times daily as needed for high blood sugar.   Lancet Device Misc 1 each by Does not apply route 3 (three) times daily. May dispense any manufacturer covered by patient's insurance.   Lancets Misc 1 each by Does not apply route 3 (three) times daily. Use as directed to check blood sugar. May dispense  any manufacturer covered by patient's insurance and fits patient's device.   meclizine 25 MG tablet Commonly known as: ANTIVERT Take 25 mg by mouth 2 (two) times daily as needed for dizziness or nausea.   nitroGLYCERIN  0.4 MG SL tablet Commonly known as: NITROSTAT  DISSOLVE ONE TABLET UNDER THE TONGUE EVERY 5 MINUTES AS NEEDED FOR CHEST PAIN.  DO NOT EXCEED A TOTAL OF 3 DOSES IN 15 MINUTES NOW   NovoLIN  N FlexPen ReliOn 100 UNIT/ML KwikPen Generic drug: Insulin  NPH (Human) (Isophane) Inject 16 Units into the skin 2 (two) times daily with a meal.   ondansetron  4 MG tablet Commonly known as: ZOFRAN  Take 4 mg by mouth every 8 (eight) hours as needed for nausea or vomiting.   oxyCODONE  15 MG immediate release tablet Commonly known as: ROXICODONE  Take 15 mg by mouth 4 (four) times daily.   Pen Needles 31G X 5 MM Misc 1 each by Does not apply route 3 (three) times daily. May dispense any manufacturer covered by patient's insurance.   polyethylene glycol 17 g packet Commonly known as: MIRALAX  / GLYCOLAX  Take 17 g by mouth daily as needed for moderate constipation.   pregabalin 75 MG capsule Commonly known as: LYRICA Take 75 mg by mouth 2 (two) times daily.   promethazine  25 MG tablet Commonly known as: PHENERGAN  Take 25 mg by mouth every 8 (eight) hours as needed for nausea or vomiting.   senna-docusate 8.6-50 MG tablet Commonly known as: Senokot-S Take 2 tablets by mouth 2 (two) times daily.   sertraline  100 MG tablet Commonly known as: ZOLOFT  Take 100 mg by mouth daily.   tiotropium 18 MCG inhalation capsule Commonly known as: SPIRIVA  Place 18 mcg into inhaler and inhale daily as needed (shortness of breath).   torsemide  20 MG tablet Commonly known as: DEMADEX  Take 1 tablet (20 mg total) by mouth as needed. Take as needed for lower extremity edema/swelling. What changed:  when to take this reasons to take this additional instructions   TYLENOL  PM EXTRA STRENGTH  PO Take 1-2 tablets by mouth at bedtime as needed (Pain/sleep).   Vitamin D3 1.25 MG (50000 UT) Caps Take 1 capsule by mouth daily.          Follow-up Information     Dsa, Joylin G, MD. Schedule an appointment as soon as possible for a visit in 1 week(s).   Specialty: Family Medicine Why: post hospitalization follow up Aug. 29,2025 @4PM  Contact information: [address] [place] [address] [phone]                 TOTAL DISCHARGE TIME:  35 minutes  Manoj Enriquez [employer] on [email or web address]  04/07/2024, 10:34 AM

## 2024-04-22 ENCOUNTER — Ambulatory Visit: Attending: Cardiology | Admitting: Physician Assistant

## 2024-04-22 ENCOUNTER — Encounter: Payer: Self-pay | Admitting: Physician Assistant

## 2024-04-22 VITALS — BP 127/73 | HR 96 | Ht 61.0 in | Wt 164.6 lb

## 2024-04-22 DIAGNOSIS — I340 Nonrheumatic mitral (valve) insufficiency: Secondary | ICD-10-CM

## 2024-04-22 DIAGNOSIS — Z720 Tobacco use: Secondary | ICD-10-CM

## 2024-04-22 DIAGNOSIS — I25119 Atherosclerotic heart disease of native coronary artery with unspecified angina pectoris: Secondary | ICD-10-CM | POA: Diagnosis not present

## 2024-04-22 DIAGNOSIS — I255 Ischemic cardiomyopathy: Secondary | ICD-10-CM | POA: Diagnosis not present

## 2024-04-22 DIAGNOSIS — E782 Mixed hyperlipidemia: Secondary | ICD-10-CM

## 2024-04-22 DIAGNOSIS — R55 Syncope and collapse: Secondary | ICD-10-CM | POA: Diagnosis not present

## 2024-04-22 MED ORDER — NITROGLYCERIN 0.4 MG SL SUBL: 0.4000 mg | SUBLINGUAL_TABLET | SUBLINGUAL | 11 refills | Status: DC | PRN

## 2024-04-22 MED ORDER — TORSEMIDE 20 MG PO TABS: 20.0000 mg | ORAL_TABLET | ORAL | 3 refills | Status: DC

## 2024-04-22 NOTE — Patient Instructions (Addendum)
 Medication Instructions:  Your physician has recommended you make the following change in your medication:   CHANGE the Torsemide  to Mondays, Wednesdays, & Friday's  *If you need a refill on your cardiac medications before your next appointment, please call your pharmacy*  Lab Work: None ordered  If you have labs (blood work) drawn today and your tests are completely normal, you will receive your results only by: MyChart Message (if you have MyChart) OR A paper copy in the mail If you have any lab test that is abnormal or we need to change your treatment, we will call you to review the results.  Testing/Procedures: Your physician has requested that you have an echocardiogram IN 6 MONTHS (FEBRUARY). Echocardiography is a painless test that uses sound waves to create images of your heart. It provides your doctor with information about the size and shape of your heart and how well your heart's chambers and valves are working. This procedure takes approximately one hour. There are no restrictions for this procedure. Please do NOT wear cologne, perfume, aftershave, or lotions (deodorant is allowed). Please arrive 15 minutes prior to your appointment time.  Please note: We ask at that you not bring children with you during ultrasound (echo/ vascular) testing. Due to room size and safety concerns, children are not allowed in the ultrasound rooms during exams. Our front office staff cannot provide observation of children in our lobby area while testing is being conducted. An adult accompanying a patient to their appointment will only be allowed in the ultrasound room at the discretion of the ultrasound technician under special circumstances. We apologize for any inconvenience.   Follow-Up: At [facility], you and your health needs are our priority.  As part of our continuing mission to provide you with exceptional heart care, our providers are all part of one team.  This team includes your  primary Cardiologist (physician) and Advanced Practice Providers or APPs (Physician Assistants and Nurse Practitioners) who all work together to provide you with the care you need, when you need it.  Your next appointment:   2 MONTHS WITH Kaely Hollan 6  month(s) WITH DR. WONDA (AFTER THE ECHO IS DONE)  Provider:   Ozell WONDA, MD    We recommend signing up for the patient portal called MyChart.  Sign up information is provided on this After Visit Summary.  MyChart is used to connect with patients for Virtual Visits (Telemedicine).  Patients are able to view lab/test results, encounter notes, upcoming appointments, etc.  Non-urgent messages can be sent to your provider as well.   To learn more about what you can do with MyChart, go to [email or web address].   Other Instructions Wear the compression stockings and abdominal binder DAILY

## 2024-04-22 NOTE — Assessment & Plan Note (Signed)
 Hx of CABG.  She had PEA arrest in 2017 and ultimately underwent DES to LCx. Her last cath in 2017 showed a patent L-LAD, occluded S-PDA and patent LCx stent.  MPI in 2024 was neg for ischemia. She has occasional chest pain that she takes NTG for. Her overall pattern is stable. No further ischemic testing needed at this time. Most of her medications were stopped due to low BP. I would not try to resume her Imdur  at this time as she is still symptomatic with orthostatic intolerance. - Continue nitroglycerin  as needed, clopidogrel  75 mg daily, Lipitor  80 mg daily

## 2024-04-22 NOTE — Progress Notes (Signed)
 OFFICE NOTE:    Date:  04/22/2024  ID:  Jazleen Robeck, DOB Feb 08, 1961, MRN [id number] PCP: Dsa, Joylin G, MD  [facility] Providers Cardiologist:  Ozell Fell, MD Cardiology APP:  Lelon Glendia DASEN, PA-C       Coronary artery disease S/p CABG S/p PEA arrest 3/17 >> s/p DES to LCx Myoview  8/17: inf scar with mod peri-infarct ischemia; EF 49 Cath 10/17: L-LAD patent, S-PDA chronic 100, LCx stent patent >> Med Rx Myoview  7/22: inf infarct w mild peri-infarct ischemia, EF 71 >> Med Rx  MPI 04/12/2023: Inferior scar, no ischemia, EF 50; intermediate risk>> med Rx Ischemic CM Echocardiogram 3/17: EF 45-50, mild MR, sept and post-lat HK Echocardiogram 7/22: EF 55-60, Gr 1 DD, trivial MR Mitral regurgitation TTE 11/2015: Mild MR; TTE 03/2021: Trivial MR TTE 04/02/2024: EF 60-65, no RWMA, normal RVSF, severe MR, AV sclerosis Carotid US  08/09/2020: Bilateral ICA 1-39 COPD Diabetes mellitus Peripheral neuropathy Hypertension Hyperlipidemia Tobacco use GERD Polycythemia        Discussed the use of AI scribe software for clinical note transcription with the patient, who gave verbal consent to proceed. History of Present Illness Sara Hobbs is a 63 y.o. female who returns for post hospital follow up. She had a fall with resulting R 5th metatarsal Fx 03/26/24. She was last seen in clinic on 04/01/24. She had a syncopal episode while here and was hypotensive (SBP 60s). We sent her to the ED via EMS for evaluation. She was admitted 7/30-8/5.  She required vasopressors to maintain adequate blood pressure and she was initially admitted to the ICU.  Syncopal episode was felt to be due to orthostatic hypotension resulting from hypovolemia.  She had diarrhea, n/v several days prior to admission. Of note, pt also on several medications that can cause orthostatic hypotension (Cymbalta , Lyrica, Sertraline ). Cortisol was normal.  She was placed on compression stockings and abdominal binder.   Antihypertensive medications were placed on hold and not resumed at discharge.  Echocardiogram demonstrated normal EF and severe MR. Dr. Fell saw the pt and reviewed her echocardiogram. He noted she has central MR and felt it is mod to severe. Inpt TEE was deferred and he felt that MR was not related to the cause for her syncope.   She feels better since discharge but still experiences lightheadedness and dizziness, especially when standing. Her dizziness is described as a 'drunk' feeling with room spinning, occurring upon standing but not when sitting. She has not consistently worn compression hose since discharge, only for two days, with no symptom improvement. She continues to take torsemide  20 mg daily. She experiences occasional chest pain, which is difficult to distinguish from other pain due to her history of post-surgical chest wall pain. She uses nitroglycerin  as needed for chest pain. She sleeps in a recliner due to space constraints and discomfort from rolling over on her chest which she has noted since CABG. She is a smoker and is considering quitting by Christmas.    ROS-See HPI    Studies Reviewed:             Physical Exam:  VS:  BP 127/73   Pulse 96   Ht 5' 1 (1.549 m)   Wt 164 lb 9.6 oz (74.7 kg)   SpO2 93%   BMI 31.10 kg/m        Wt Readings from Last 3 Encounters:  04/22/24 164 lb 9.6 oz (74.7 kg)  04/07/24 165 lb 3.2 oz (74.9 kg)  03/26/24 160 lb (72.6 kg)    Constitutional:      Appearance: Healthy appearance. Not in distress.  Neck:     Vascular: No JVR.  Pulmonary:     Breath sounds: Normal breath sounds. No wheezing. No rales.  Cardiovascular:     Normal rate. Regular rhythm.     Murmurs: There is no murmur.  Edema:    Peripheral edema absent.  Abdominal:     Palpations: Abdomen is soft.      Assessment and Plan:    Assessment & Plan Coronary artery disease involving native coronary artery of native heart with angina pectoris (HCC) Hx of CABG.   She had PEA arrest in 2017 and ultimately underwent DES to LCx. Her last cath in 2017 showed a patent L-LAD, occluded S-PDA and patent LCx stent.  MPI in 2024 was neg for ischemia. She has occasional chest pain that she takes NTG for. Her overall pattern is stable. No further ischemic testing needed at this time. Most of her medications were stopped due to low BP. I would not try to resume her Imdur  at this time as she is still symptomatic with orthostatic intolerance. - Continue nitroglycerin  as needed, clopidogrel  75 mg daily, Lipitor  80 mg daily Syncope and collapse This was felt to be due to orthostatic hypotension in the setting of volume depletion.  She is still symptomatic.  She also has some symptoms that sound consistent with vertigo.  Blood pressure is much better.  She is not wearing compression hose or abdominal binder.  She was to change her torsemide  to as needed but is taking it daily. -Change torsemide  to 20 mg every Monday, Wednesday, Friday -Encouraged use of compression hose and abdominal binder Non-rheumatic mitral regurgitation Severe mitral regurgitation on recent echocardiogram.  I cannot appreciate a murmur on exam.  I reviewed her echocardiogram with Dr. Wonda.  She does not currently need a TEE.  She will be high risk for valve intervention.  Arrange follow-up echocardiogram in 6 months to reassess mitral regurgitation. -Echocardiogram 6 months -Follow-up with Dr. Wonda 6 months, after echocardiogram Ischemic cardiomyopathy EF improved with medical therapy in the past.  She was previously on carvedilol , lisinopril , Farxiga and isosorbide .  Her blood pressure is better.  However, I do not think she would be able to tolerate resumption of her medications at this time.  She is still symptomatic with orthostatic intolerance. -Adjust torsemide  as noted -Follow-up with me in 2 months.  Try to resume ACE inhibitor at that time. Mixed hyperlipidemia Continue Lipitor  80 mg  daily. Tobacco use She would like to quit.  She is aiming to quit by her birthday.        Dispo:  Return in about 8 weeks (around 06/17/2024) for Routine Follow Up, w/ Glendia Ferrier, PA-C.  Signed, Glendia Ferrier, PA-C

## 2024-05-09 ENCOUNTER — Other Ambulatory Visit: Payer: Self-pay | Admitting: Cardiovascular Disease

## 2024-06-22 ENCOUNTER — Ambulatory Visit: Attending: Physician Assistant | Admitting: Physician Assistant

## 2024-06-22 ENCOUNTER — Encounter: Payer: Self-pay | Admitting: Physician Assistant

## 2024-06-22 VITALS — BP 100/70 | HR 95 | Ht 61.0 in | Wt 168.8 lb

## 2024-06-22 DIAGNOSIS — I25119 Atherosclerotic heart disease of native coronary artery with unspecified angina pectoris: Secondary | ICD-10-CM

## 2024-06-22 DIAGNOSIS — I340 Nonrheumatic mitral (valve) insufficiency: Secondary | ICD-10-CM | POA: Diagnosis not present

## 2024-06-22 DIAGNOSIS — Z720 Tobacco use: Secondary | ICD-10-CM

## 2024-06-22 DIAGNOSIS — I255 Ischemic cardiomyopathy: Secondary | ICD-10-CM | POA: Diagnosis not present

## 2024-06-22 MED ORDER — ALBUTEROL SULFATE HFA 108 (90 BASE) MCG/ACT IN AERS: 2.0000 | INHALATION_SPRAY | Freq: Four times a day (QID) | RESPIRATORY_TRACT | 0 refills | Status: AC | PRN

## 2024-06-22 MED ORDER — NITROGLYCERIN 0.4 MG SL SUBL: 0.4000 mg | SUBLINGUAL_TABLET | SUBLINGUAL | 11 refills | Status: AC | PRN

## 2024-06-22 MED ORDER — TORSEMIDE 20 MG PO TABS: 10.0000 mg | ORAL_TABLET | Freq: Every day | ORAL | Status: AC

## 2024-06-22 NOTE — Patient Instructions (Signed)
 Medication Instructions:  DECREASE Torsemide  to 10mg  Take 1 tablet once a day *If you need a refill on your cardiac medications before your next appointment, please call your pharmacy*  Lab Work: None ordered If you have labs (blood work) drawn today and your tests are completely normal, you will receive your results only by: MyChart Message (if you have MyChart) OR A paper copy in the mail If you have any lab test that is abnormal or we need to change your treatment, we will call you to review the results.  Testing/Procedures: PLEASE SCHEDULE ECHO IN DECEMBER 2025 (order placed 04/22/24)  Follow-Up: At [facility], you and your health needs are our priority.  As part of our continuing mission to provide you with exceptional heart care, our providers are all part of one team.  This team includes your primary Cardiologist (physician) and Advanced Practice Providers or APPs (Physician Assistants and Nurse Practitioners) who all work together to provide you with the care you need, when you need it.  Your next appointment:   6 month(s)  Provider:   Ozell Fell, MD    We recommend signing up for the patient portal called MyChart.  Sign up information is provided on this After Visit Summary.  MyChart is used to connect with patients for Virtual Visits (Telemedicine).  Patients are able to view lab/test results, encounter notes, upcoming appointments, etc.  Non-urgent messages can be sent to your provider as well.   To learn more about what you can do with MyChart, go to [email or web address].   Other Instructions

## 2024-06-22 NOTE — Progress Notes (Signed)
 OFFICE NOTE:    Date:  06/22/2024  ID:  Sara Hobbs, DOB June 02, 1961, MRN [id number] PCP: Dsa, Joylin G, MD  [facility] Providers Cardiologist:  Ozell Fell, MD Cardiology APP:  Lelon Glendia DASEN, PA-C        Coronary artery disease S/p CABG S/p PEA arrest 3/17 >> s/p DES to LCx Myoview  8/17: inf scar with mod peri-infarct ischemia; EF 49 Cath 10/17: L-LAD patent, S-PDA chronic 100, LCx stent patent >> Med Rx Myoview  7/22: inf infarct w mild peri-infarct ischemia, EF 71 >> Med Rx  MPI 04/12/2023: Inferior scar, no ischemia, EF 50; intermediate risk>> med Rx Ischemic CM Echocardiogram 3/17: EF 45-50, mild MR, sept and post-lat HK Echocardiogram 7/22: EF 55-60, Gr 1 DD, trivial MR Mitral regurgitation TTE 11/2015: Mild MR; TTE 03/2021: Trivial MR TTE 04/02/2024: EF 60-65, no RWMA, normal RVSF, severe MR, AV sclerosis Carotid US  08/09/2020: Bilateral ICA 1-39 COPD Diabetes mellitus Peripheral neuropathy Hypertension Hyperlipidemia Tobacco use GERD Polycythemia        Discussed the use of AI scribe software for clinical note transcription with the patient, who gave verbal consent to proceed. History of Present Illness Sara Hobbs is a 63 y.o. female who returns for follow up of CAD, MR.   She was admitted from the office for syncope in 03/2024. She was hypotensive. Her antihypertensive regimen was held. She was last seen 04/22/24. BP was still running low and I did not resume medications any further.   She has not had chest pain, but she experiences shortness of breath in the morning and at night.  She has slept in recliner since her bypass surgery due to discomfort.  Her blood pressure remains low, with recent readings of 99/66 mmHg. She feels 'drunk' and experiences falls without passing out. She continues to take torsemide  20 mg daily. She experiences swelling in her feet and hands when not taking torsemide  daily.     ROS-See HPI    Studies Reviewed:              Physical Exam:  VS:  BP 100/70   Pulse 95   Ht 5' 1 (1.549 m)   Wt 168 lb 12.8 oz (76.6 kg)   SpO2 94%   BMI 31.89 kg/m        Wt Readings from Last 3 Encounters:  06/22/24 168 lb 12.8 oz (76.6 kg)  04/22/24 164 lb 9.6 oz (74.7 kg)  04/07/24 165 lb 3.2 oz (74.9 kg)    Constitutional:      Appearance: Healthy appearance. Not in distress.  Pulmonary:     Breath sounds: Normal breath sounds. No wheezing. No rales.  Cardiovascular:     Normal rate. Regular rhythm.     Murmurs: There is a grade 2/6 systolic murmur at the LLSB.  Edema:    Peripheral edema absent.  Abdominal:     Palpations: Abdomen is soft.       Assessment and Plan:    Assessment & Plan Coronary artery disease involving native coronary artery of native heart with angina pectoris Hx of CABG.  She had PEA arrest in 2017 and ultimately underwent DES to LCx. Her last cath in 2017 showed a patent L-LAD, occluded S-PDA and patent LCx stent.  MPI in 2024 was neg for ischemia. No current chest pain or symptoms suggestive of ischemia. - Continue Plavix  75 mg daily - Continue Lipitor  80 mg daily - Continue nitroglycerin  PRN Non-rheumatic mitral regurgitation Severe mitral regurgitation on recent  echocardiogram.  Pt is felt to be high risk for valve intervention. She will have a repeat Echocardiogram in Dec 2025/Jan 2026.  - Follow up Dr. Wonda 6 mos  Ischemic cardiomyopathy EF improved with medical therapy in the past. She was previously on carvedilol , lisinopril , Farxiga and isosorbide . BP still will not tolerate resuming these. She increased her Torsemide  back to 20 mg daily due to pedal edema. Her BP remains on the low side and she seems symptomatic at times. Will try to reduce her diuretic further to see if this helps.  - Decrease Torsemide  to 10 mg once daily  Tobacco use She continues to smoke. I have again encouraged her to quit.         Dispo:  Return in about 6 months (around 12/21/2024) for Routine  Follow Up, w/ Dr. Wonda.  Signed, Glendia Ferrier, PA-C

## 2024-06-22 NOTE — Assessment & Plan Note (Addendum)
 Hx of CABG.  She had PEA arrest in 2017 and ultimately underwent DES to LCx. Her last cath in 2017 showed a patent L-LAD, occluded S-PDA and patent LCx stent.  MPI in 2024 was neg for ischemia. No current chest pain or symptoms suggestive of ischemia. - Continue Plavix  75 mg daily - Continue Lipitor  80 mg daily - Continue nitroglycerin  PRN

## 2024-07-30 ENCOUNTER — Other Ambulatory Visit: Payer: Self-pay | Admitting: Physician Assistant

## 2024-08-14 ENCOUNTER — Ambulatory Visit (HOSPITAL_COMMUNITY)
Admission: RE | Admit: 2024-08-14 | Discharge: 2024-08-14 | Disposition: A | Source: Ambulatory Visit | Attending: Physician Assistant | Admitting: Physician Assistant

## 2024-08-14 DIAGNOSIS — I255 Ischemic cardiomyopathy: Secondary | ICD-10-CM

## 2024-08-14 DIAGNOSIS — I340 Nonrheumatic mitral (valve) insufficiency: Secondary | ICD-10-CM

## 2024-08-14 DIAGNOSIS — I25119 Atherosclerotic heart disease of native coronary artery with unspecified angina pectoris: Secondary | ICD-10-CM

## 2024-08-14 LAB — ECHOCARDIOGRAM COMPLETE
Area-P 1/2: 5.09 cm2
S' Lateral: 2.84 cm

## 2024-08-17 ENCOUNTER — Ambulatory Visit: Payer: Self-pay | Admitting: Physician Assistant

## 2024-08-17 DIAGNOSIS — I340 Nonrheumatic mitral (valve) insufficiency: Secondary | ICD-10-CM

## 2024-09-04 ENCOUNTER — Telehealth: Payer: Self-pay | Admitting: Cardiovascular Disease

## 2024-09-04 DIAGNOSIS — Z79899 Other long term (current) drug therapy: Secondary | ICD-10-CM

## 2024-09-04 NOTE — Telephone Encounter (Signed)
 Patient called wanting to know if she can go back on one of blood pressure medication.  She stated she was taken off of them because her BP was running low, but know it back to normal, and she was told once it was back to normal that she could start taking at least one of the medication.

## 2024-09-04 NOTE — Telephone Encounter (Signed)
 Called patient x2. Appears someone may be answering and then call drops.

## 2024-09-07 NOTE — Telephone Encounter (Signed)
 Spoke with pt, she reports her carvedilol  and lisinopril  were stopped. She does not check her blood pressure at home but reports at her other doctors appointments they have said it was fine. She continues to have some dizziness that she feels is related to her ears because she is having some ear problems. Her dizziness occurs while walking and if she turns to look at something she will feel drunk. She is currently in [place] . Aware scott weaver is not in the office today but will forward for his review.

## 2024-09-07 NOTE — Telephone Encounter (Signed)
 Can we request most recent notes from providers she has seen recently to get an idea of what her BP readings have been? Sara Ferrier, PA-C    09/07/2024 3:00 PM

## 2024-09-07 NOTE — Telephone Encounter (Signed)
 Spoke with pt regarding her blood pressure. Pt stated her readings have been 120s/80s. Pt plans to have records sent to us  with her blood pressure readings from other offices. Pt was told to get a bp cuff if possible and keep a log to send to us  when she can. Pt verbalized understanding. All questions if any were answered.

## 2024-09-08 NOTE — Telephone Encounter (Signed)
 Called pt to relay message. Pt made aware, she verbalized understanding.  She is currently in [place] , she does not know a pharmacy near her she will call back tomorrow with that information to get Lisinopril  sent it.  Pt will find a [facility] to have labs drawn in 3 weeks.

## 2024-09-08 NOTE — Telephone Encounter (Signed)
 We can try to resume her Lisinopril . If she tolerates it, we can try to add Carvedilol  next. It would be good if she can get a BP cuff and monitor her BP after starting this. PLAN:  - Start Lisinopril  2.5 mg once daily  - BMET in 3 weeks - If possible monitor blood pressure and send a few readings 2-3 weeks after resuming Lisinopril . Glendia Ferrier, PA-C    09/08/2024 5:09 PM

## 2024-09-09 ENCOUNTER — Telehealth: Payer: Self-pay | Admitting: Physician Assistant

## 2024-09-09 NOTE — Telephone Encounter (Signed)
" °*  STAT* If patient is at the pharmacy, call can be transferred to refill team.   1. Which medications need to be refilled? (please list name of each medication and dose if known)   carvedilol  (COREG ) 3.125 MG tablet     2. Would you like to learn more about the convenience, safety, & potential cost savings by using the [facility]? No    3. Are you open to using the [facility] (Type [facility]. No    4. Which pharmacy/location (including street and city if local pharmacy) is medication to be sent to?[id number] - [place], [place] - [address]    5. Do they need a 30 day or 90 day supply? 90 day   Pt is out of medication.  "
